# Patient Record
Sex: Female | Born: 1990 | Race: Black or African American | Hispanic: No | Marital: Single
Health system: Southern US, Academic
[De-identification: ages and names within clinical notes are randomized; demographics above are authoritative.]

## PROBLEM LIST (undated history)

## (undated) ENCOUNTER — Encounter

## (undated) ENCOUNTER — Telehealth

## (undated) ENCOUNTER — Ambulatory Visit

## (undated) ENCOUNTER — Encounter: Attending: Internal Medicine | Primary: Internal Medicine

## (undated) ENCOUNTER — Encounter: Payer: PRIVATE HEALTH INSURANCE | Attending: Nutritionist | Primary: Nutritionist

## (undated) ENCOUNTER — Ambulatory Visit: Payer: PRIVATE HEALTH INSURANCE

## (undated) ENCOUNTER — Encounter: Attending: Clinical | Primary: Clinical

## (undated) ENCOUNTER — Encounter: Attending: Adult Health | Primary: Adult Health

## (undated) ENCOUNTER — Ambulatory Visit: Payer: Medicaid (Managed Care)

## (undated) ENCOUNTER — Ambulatory Visit
Payer: PRIVATE HEALTH INSURANCE | Attending: Rehabilitative and Restorative Service Providers" | Primary: Rehabilitative and Restorative Service Providers"

## (undated) ENCOUNTER — Other Ambulatory Visit

## (undated) ENCOUNTER — Encounter: Attending: "Women's Health Care | Primary: "Women's Health Care

## (undated) ENCOUNTER — Encounter: Payer: PRIVATE HEALTH INSURANCE | Attending: Family | Primary: Family

## (undated) ENCOUNTER — Ambulatory Visit: Payer: PRIVATE HEALTH INSURANCE | Attending: Clinical | Primary: Clinical

## (undated) ENCOUNTER — Encounter
Attending: Student in an Organized Health Care Education/Training Program | Primary: Student in an Organized Health Care Education/Training Program

## (undated) ENCOUNTER — Ambulatory Visit: Payer: PRIVATE HEALTH INSURANCE | Attending: Foot and Ankle Surgery | Primary: Foot and Ankle Surgery

## (undated) ENCOUNTER — Encounter: Attending: "Endocrinology | Primary: "Endocrinology

## (undated) ENCOUNTER — Ambulatory Visit
Payer: PRIVATE HEALTH INSURANCE | Attending: Student in an Organized Health Care Education/Training Program | Primary: Student in an Organized Health Care Education/Training Program

## (undated) ENCOUNTER — Ambulatory Visit
Payer: Medicaid (Managed Care) | Attending: Student in an Organized Health Care Education/Training Program | Primary: Student in an Organized Health Care Education/Training Program

## (undated) ENCOUNTER — Telehealth
Attending: Student in an Organized Health Care Education/Training Program | Primary: Student in an Organized Health Care Education/Training Program

## (undated) ENCOUNTER — Ambulatory Visit: Attending: Nutritionist | Primary: Nutritionist

## (undated) ENCOUNTER — Encounter: Attending: Medical | Primary: Medical

## (undated) ENCOUNTER — Ambulatory Visit: Payer: Medicaid (Managed Care) | Attending: Clinical | Primary: Clinical

## (undated) ENCOUNTER — Telehealth: Attending: Urology | Primary: Urology

## (undated) ENCOUNTER — Ambulatory Visit: Payer: Medicaid (Managed Care) | Attending: Acute Care | Primary: Acute Care

## (undated) ENCOUNTER — Encounter: Payer: PRIVATE HEALTH INSURANCE | Attending: Medical | Primary: Medical

## (undated) ENCOUNTER — Encounter: Attending: Family | Primary: Family

## (undated) ENCOUNTER — Ambulatory Visit: Payer: Medicaid (Managed Care) | Attending: Adult Health | Primary: Adult Health

## (undated) ENCOUNTER — Ambulatory Visit: Attending: Clinical | Primary: Clinical

## (undated) ENCOUNTER — Ambulatory Visit: Payer: PRIVATE HEALTH INSURANCE | Attending: Gastroenterology | Primary: Gastroenterology

## (undated) ENCOUNTER — Non-Acute Institutional Stay: Payer: PRIVATE HEALTH INSURANCE

## (undated) ENCOUNTER — Encounter: Attending: Foot and Ankle Surgery | Primary: Foot and Ankle Surgery

## (undated) ENCOUNTER — Ambulatory Visit: Payer: Medicaid (Managed Care) | Attending: Internal Medicine | Primary: Internal Medicine

## (undated) ENCOUNTER — Ambulatory Visit: Attending: Medical | Primary: Medical

## (undated) ENCOUNTER — Ambulatory Visit: Payer: Medicaid (Managed Care) | Attending: Vascular Surgery | Primary: Vascular Surgery

## (undated) ENCOUNTER — Encounter: Attending: Physician Assistant | Primary: Physician Assistant

## (undated) ENCOUNTER — Encounter: Attending: Nutritionist | Primary: Nutritionist

## (undated) ENCOUNTER — Ambulatory Visit: Payer: PRIVATE HEALTH INSURANCE | Attending: "Endocrinology | Primary: "Endocrinology

## (undated) ENCOUNTER — Ambulatory Visit: Payer: Medicaid (Managed Care) | Attending: Geriatric Medicine | Primary: Geriatric Medicine

## (undated) ENCOUNTER — Ambulatory Visit: Payer: PRIVATE HEALTH INSURANCE | Attending: Nutritionist | Primary: Nutritionist

## (undated) ENCOUNTER — Encounter: Attending: Urology | Primary: Urology

## (undated) ENCOUNTER — Encounter
Payer: PRIVATE HEALTH INSURANCE | Attending: Student in an Organized Health Care Education/Training Program | Primary: Student in an Organized Health Care Education/Training Program

## (undated) ENCOUNTER — Other Ambulatory Visit: Attending: Clinical | Primary: Clinical

## (undated) ENCOUNTER — Ambulatory Visit: Payer: PRIVATE HEALTH INSURANCE | Attending: "Women's Health Care | Primary: "Women's Health Care

## (undated) ENCOUNTER — Encounter: Payer: PRIVATE HEALTH INSURANCE | Attending: Registered" | Primary: Registered"

## (undated) ENCOUNTER — Ambulatory Visit: Payer: PRIVATE HEALTH INSURANCE | Attending: Nurse Practitioner | Primary: Nurse Practitioner

## (undated) ENCOUNTER — Encounter
Attending: Rehabilitative and Restorative Service Providers" | Primary: Rehabilitative and Restorative Service Providers"

## (undated) ENCOUNTER — Ambulatory Visit: Payer: PRIVATE HEALTH INSURANCE | Attending: Vascular Surgery | Primary: Vascular Surgery

## (undated) ENCOUNTER — Encounter: Attending: Foot & Ankle Surgery | Primary: Foot & Ankle Surgery

## (undated) DIAGNOSIS — F329 Major depressive disorder, single episode, unspecified: Secondary | ICD-10-CM

## (undated) DIAGNOSIS — R0789 Other chest pain: Secondary | ICD-10-CM

## (undated) DIAGNOSIS — E669 Obesity, unspecified: Secondary | ICD-10-CM

## (undated) DIAGNOSIS — F419 Anxiety disorder, unspecified: Secondary | ICD-10-CM

## (undated) DIAGNOSIS — F32A Depression, unspecified: Secondary | ICD-10-CM

## (undated) DIAGNOSIS — I10 Essential (primary) hypertension: Secondary | ICD-10-CM

## (undated) DIAGNOSIS — E119 Type 2 diabetes mellitus without complications: Secondary | ICD-10-CM

## (undated) DIAGNOSIS — D649 Anemia, unspecified: Secondary | ICD-10-CM

## (undated) DIAGNOSIS — J180 Bronchopneumonia, unspecified organism: Secondary | ICD-10-CM

## (undated) DIAGNOSIS — K219 Gastro-esophageal reflux disease without esophagitis: Secondary | ICD-10-CM

## (undated) DIAGNOSIS — R55 Syncope and collapse: Secondary | ICD-10-CM

## (undated) DIAGNOSIS — L209 Atopic dermatitis, unspecified: Secondary | ICD-10-CM

## (undated) DIAGNOSIS — K3184 Gastroparesis: Secondary | ICD-10-CM

## (undated) DIAGNOSIS — J45909 Unspecified asthma, uncomplicated: Secondary | ICD-10-CM

## (undated) DIAGNOSIS — L732 Hidradenitis suppurativa: Secondary | ICD-10-CM

## (undated) HISTORY — PX: HYDRADENITIS EXCISION: SHX5243

## (undated) HISTORY — DX: Major depressive disorder, single episode, unspecified: F32.9

## (undated) HISTORY — DX: Bronchopneumonia, unspecified organism: J18.0

## (undated) HISTORY — DX: Anxiety disorder, unspecified: F41.9

## (undated) HISTORY — DX: Gastro-esophageal reflux disease without esophagitis: K21.9

## (undated) HISTORY — PX: WISDOM TOOTH EXTRACTION: SHX21

## (undated) HISTORY — DX: Other chest pain: R07.89

## (undated) HISTORY — DX: Essential (primary) hypertension: I10

## (undated) HISTORY — DX: Syncope and collapse: R55

## (undated) HISTORY — DX: Hidradenitis suppurativa: L73.2

## (undated) HISTORY — PX: FRACTURE SURGERY: SHX138

## (undated) HISTORY — DX: Atopic dermatitis, unspecified: L20.9

## (undated) HISTORY — DX: Depression, unspecified: F32.A

## (undated) HISTORY — PX: CHOLECYSTECTOMY: SHX55

## (undated) HISTORY — PX: KNEE SURGERY: SHX244

## (undated) HISTORY — DX: Obesity, unspecified: E66.9

## (undated) HISTORY — DX: Unspecified asthma, uncomplicated: J45.909

## (undated) HISTORY — DX: Type 2 diabetes mellitus without complications: E11.9

## (undated) HISTORY — DX: Gastroparesis: K31.84

## (undated) HISTORY — DX: Anemia, unspecified: D64.9

## (undated) MED ORDER — ROSUVASTATIN ORAL
ORAL | 0 days
Start: ? — End: 2020-07-31

---
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
## 2010-04-17 ENCOUNTER — Emergency Department: Payer: Self-pay | Admitting: Emergency Medicine

## 2011-05-10 ENCOUNTER — Ambulatory Visit: Payer: Self-pay

## 2011-06-08 ENCOUNTER — Ambulatory Visit: Payer: Self-pay

## 2012-09-09 DIAGNOSIS — J302 Other seasonal allergic rhinitis: Secondary | ICD-10-CM | POA: Insufficient documentation

## 2012-12-06 ENCOUNTER — Emergency Department: Payer: Self-pay | Admitting: Emergency Medicine

## 2012-12-06 LAB — COMPREHENSIVE METABOLIC PANEL
Albumin: 3.6 g/dL (ref 3.4–5.0)
Alkaline Phosphatase: 72 U/L
Anion Gap: 5 — ABNORMAL LOW (ref 7–16)
BUN: 11 mg/dL (ref 7–18)
Bilirubin,Total: 0.5 mg/dL (ref 0.2–1.0)
Calcium, Total: 8.7 mg/dL (ref 8.5–10.1)
Chloride: 107 mmol/L (ref 98–107)
Co2: 29 mmol/L (ref 21–32)
Creatinine: 0.57 mg/dL — ABNORMAL LOW (ref 0.60–1.30)
EGFR (African American): >60
EGFR (Non-African Amer.): >60
Glucose: 194 mg/dL — ABNORMAL HIGH (ref 65–99)
Osmolality: 286 (ref 275–301)
Potassium: 4.2 mmol/L (ref 3.5–5.1)
SGOT(AST): 4 U/L — ABNORMAL LOW (ref 15–37)
SGPT (ALT): 14 U/L (ref 12–78)
Sodium: 141 mmol/L (ref 136–145)
Total Protein: 7.3 g/dL (ref 6.4–8.2)

## 2012-12-06 LAB — CBC WITH DIFFERENTIAL/PLATELET
Basophil #: 0 10*3/uL (ref 0.0–0.1)
Basophil %: 0.9 %
Eosinophil #: 0.1 10*3/uL (ref 0.0–0.7)
Eosinophil %: 1.6 %
HCT: 42.1 % (ref 35.0–47.0)
HGB: 14.4 g/dL (ref 12.0–16.0)
Lymphocyte #: 1.2 10*3/uL (ref 1.0–3.6)
Lymphocyte %: 24.7 %
MCH: 30 pg (ref 26.0–34.0)
MCHC: 34.3 g/dL (ref 32.0–36.0)
MCV: 87 fL (ref 80–100)
Monocyte #: 0.3 x10 3/mm (ref 0.2–0.9)
Monocyte %: 5.4 %
Neutrophil #: 3.3 10*3/uL (ref 1.4–6.5)
Neutrophil %: 67.4 %
Platelet: 274 10*3/uL (ref 150–440)
RBC: 4.82 10*6/uL (ref 3.80–5.20)
RDW: 13.2 % (ref 11.5–14.5)
WBC: 5 10*3/uL (ref 3.6–11.0)

## 2012-12-06 LAB — URINALYSIS, COMPLETE
Bilirubin,UR: NEGATIVE
Blood: NEGATIVE
Glucose,UR: >=500 mg/dL (ref 0–75)
Hyaline Cast: 2
Ketone: NEGATIVE
Leukocyte Esterase: NEGATIVE
Nitrite: NEGATIVE
Ph: 5 (ref 4.5–8.0)
Protein: 30
RBC,UR: 1 /HPF (ref 0–5)
Specific Gravity: 1.03 (ref 1.003–1.030)
Squamous Epithelial: 3
WBC UR: 1 /HPF (ref 0–5)

## 2013-03-30 ENCOUNTER — Ambulatory Visit: Payer: Self-pay | Admitting: Physician Assistant

## 2013-04-04 ENCOUNTER — Emergency Department: Payer: Self-pay | Admitting: Internal Medicine

## 2013-04-04 LAB — COMPREHENSIVE METABOLIC PANEL
Albumin: 3.6 g/dL (ref 3.4–5.0)
Alkaline Phosphatase: 69 U/L
Anion Gap: 3 — ABNORMAL LOW (ref 7–16)
BUN: 12 mg/dL (ref 7–18)
Bilirubin,Total: 0.6 mg/dL (ref 0.2–1.0)
Calcium, Total: 8.8 mg/dL (ref 8.5–10.1)
Chloride: 106 mmol/L (ref 98–107)
Co2: 30 mmol/L (ref 21–32)
Creatinine: 0.66 mg/dL (ref 0.60–1.30)
EGFR (African American): >60
EGFR (Non-African Amer.): >60
Glucose: 179 mg/dL — ABNORMAL HIGH (ref 65–99)
Osmolality: 282 (ref 275–301)
Potassium: 3.9 mmol/L (ref 3.5–5.1)
SGOT(AST): <5 U/L — ABNORMAL LOW (ref 15–37)
SGPT (ALT): 13 U/L (ref 12–78)
Sodium: 139 mmol/L (ref 136–145)
Total Protein: 7.5 g/dL (ref 6.4–8.2)

## 2013-04-04 LAB — URINALYSIS, COMPLETE
Bacteria: NONE SEEN
Bilirubin,UR: NEGATIVE
Blood: NEGATIVE
Glucose,UR: >=500 mg/dL (ref 0–75)
Ketone: NEGATIVE
Leukocyte Esterase: NEGATIVE
Nitrite: NEGATIVE
Ph: 5 (ref 4.5–8.0)
Protein: NEGATIVE
RBC,UR: 1 /HPF (ref 0–5)
Specific Gravity: 1.024 (ref 1.003–1.030)
Squamous Epithelial: NONE SEEN
WBC UR: 1 /HPF (ref 0–5)

## 2013-04-04 LAB — CBC WITH DIFFERENTIAL/PLATELET
Basophil #: 0.1 10*3/uL (ref 0.0–0.1)
Basophil %: 1.1 %
Eosinophil #: 0.1 10*3/uL (ref 0.0–0.7)
Eosinophil %: 1.6 %
HCT: 40.8 % (ref 35.0–47.0)
HGB: 13.7 g/dL (ref 12.0–16.0)
Lymphocyte #: 1.3 10*3/uL (ref 1.0–3.6)
Lymphocyte %: 26.2 %
MCH: 29.7 pg (ref 26.0–34.0)
MCHC: 33.5 g/dL (ref 32.0–36.0)
MCV: 89 fL (ref 80–100)
Monocyte #: 0.3 x10 3/mm (ref 0.2–0.9)
Monocyte %: 5.8 %
Neutrophil #: 3.1 10*3/uL (ref 1.4–6.5)
Neutrophil %: 65.3 %
Platelet: 268 10*3/uL (ref 150–440)
RBC: 4.61 10*6/uL (ref 3.80–5.20)
RDW: 13.6 % (ref 11.5–14.5)
WBC: 4.8 10*3/uL (ref 3.6–11.0)

## 2013-04-04 LAB — LIPASE, BLOOD: Lipase: 146 U/L (ref 73–393)

## 2013-04-04 LAB — PREGNANCY, URINE: Pregnancy Test, Urine: NEGATIVE m[IU]/mL

## 2013-04-07 ENCOUNTER — Ambulatory Visit: Payer: Self-pay

## 2013-04-10 ENCOUNTER — Emergency Department: Payer: Self-pay | Admitting: Emergency Medicine

## 2013-04-10 LAB — COMPREHENSIVE METABOLIC PANEL
Albumin: 3.4 g/dL (ref 3.4–5.0)
Alkaline Phosphatase: 66 U/L
Anion Gap: 5 — ABNORMAL LOW (ref 7–16)
BUN: 11 mg/dL (ref 7–18)
Bilirubin,Total: 0.2 mg/dL (ref 0.2–1.0)
Calcium, Total: 8 mg/dL — ABNORMAL LOW (ref 8.5–10.1)
Chloride: 106 mmol/L (ref 98–107)
Co2: 29 mmol/L (ref 21–32)
Creatinine: 0.58 mg/dL — ABNORMAL LOW (ref 0.60–1.30)
EGFR (African American): >60
EGFR (Non-African Amer.): >60
Glucose: 220 mg/dL — ABNORMAL HIGH (ref 65–99)
Osmolality: 286 (ref 275–301)
Potassium: 3.7 mmol/L (ref 3.5–5.1)
SGOT(AST): 12 U/L — ABNORMAL LOW (ref 15–37)
SGPT (ALT): 15 U/L (ref 12–78)
Sodium: 140 mmol/L (ref 136–145)
Total Protein: 7 g/dL (ref 6.4–8.2)

## 2013-04-10 LAB — URINALYSIS, COMPLETE
Bacteria: NONE SEEN
Bilirubin,UR: NEGATIVE
Blood: NEGATIVE
Glucose,UR: >=500 mg/dL (ref 0–75)
Ketone: NEGATIVE
Leukocyte Esterase: NEGATIVE
Nitrite: NEGATIVE
Ph: 6 (ref 4.5–8.0)
Protein: NEGATIVE
RBC,UR: 1 /HPF (ref 0–5)
Specific Gravity: 1.03 (ref 1.003–1.030)
Squamous Epithelial: <1
WBC UR: <1 /HPF (ref 0–5)

## 2013-04-10 LAB — CBC
HCT: 39.9 % (ref 35.0–47.0)
HGB: 13.6 g/dL (ref 12.0–16.0)
MCH: 29.9 pg (ref 26.0–34.0)
MCHC: 34 g/dL (ref 32.0–36.0)
MCV: 88 fL (ref 80–100)
Platelet: 248 10*3/uL (ref 150–440)
RBC: 4.54 10*6/uL (ref 3.80–5.20)
RDW: 13.5 % (ref 11.5–14.5)
WBC: 5.5 10*3/uL (ref 3.6–11.0)

## 2013-04-10 LAB — LIPASE, BLOOD: Lipase: 126 U/L (ref 73–393)

## 2013-04-10 LAB — PREGNANCY, URINE: Pregnancy Test, Urine: NEGATIVE m[IU]/mL

## 2013-04-16 ENCOUNTER — Ambulatory Visit: Payer: Self-pay

## 2013-04-22 DIAGNOSIS — E119 Type 2 diabetes mellitus without complications: Secondary | ICD-10-CM | POA: Insufficient documentation

## 2013-04-22 DIAGNOSIS — K8020 Calculus of gallbladder without cholecystitis without obstruction: Secondary | ICD-10-CM | POA: Insufficient documentation

## 2013-09-10 ENCOUNTER — Emergency Department: Payer: Self-pay | Admitting: Emergency Medicine

## 2013-09-10 LAB — URINALYSIS, COMPLETE
Bacteria: NONE SEEN
Bilirubin,UR: NEGATIVE
Blood: NEGATIVE
Glucose,UR: >=500 mg/dL (ref 0–75)
Ketone: NEGATIVE
Leukocyte Esterase: NEGATIVE
Nitrite: NEGATIVE
Ph: 6 (ref 4.5–8.0)
Protein: NEGATIVE
RBC,UR: <1 /HPF (ref 0–5)
Specific Gravity: 1.044 (ref 1.003–1.030)
Squamous Epithelial: 1
WBC UR: 2 /HPF (ref 0–5)

## 2013-09-10 LAB — COMPREHENSIVE METABOLIC PANEL
Albumin: 3.5 g/dL (ref 3.4–5.0)
Alkaline Phosphatase: 79 U/L
Anion Gap: 5 — ABNORMAL LOW (ref 7–16)
BUN: 8 mg/dL (ref 7–18)
Bilirubin,Total: 0.4 mg/dL (ref 0.2–1.0)
Calcium, Total: 8.3 mg/dL — ABNORMAL LOW (ref 8.5–10.1)
Chloride: 106 mmol/L (ref 98–107)
Co2: 30 mmol/L (ref 21–32)
Creatinine: 0.62 mg/dL (ref 0.60–1.30)
EGFR (African American): >60
EGFR (Non-African Amer.): >60
Glucose: 249 mg/dL — ABNORMAL HIGH (ref 65–99)
Osmolality: 288 (ref 275–301)
Potassium: 3.7 mmol/L (ref 3.5–5.1)
SGOT(AST): 22 U/L (ref 15–37)
SGPT (ALT): 14 U/L
Sodium: 141 mmol/L (ref 136–145)
Total Protein: 7.4 g/dL (ref 6.4–8.2)

## 2013-09-10 LAB — CBC WITH DIFFERENTIAL/PLATELET
Basophil #: 0 10*3/uL (ref 0.0–0.1)
Basophil %: 0.4 %
Eosinophil #: 0.2 10*3/uL (ref 0.0–0.7)
Eosinophil %: 3.6 %
HCT: 42.6 % (ref 35.0–47.0)
HGB: 14.1 g/dL (ref 12.0–16.0)
Lymphocyte #: 1.2 10*3/uL (ref 1.0–3.6)
Lymphocyte %: 24.6 %
MCH: 29.3 pg (ref 26.0–34.0)
MCHC: 33.1 g/dL (ref 32.0–36.0)
MCV: 89 fL (ref 80–100)
Monocyte #: 0.3 x10 3/mm (ref 0.2–0.9)
Monocyte %: 5.3 %
Neutrophil #: 3.3 10*3/uL (ref 1.4–6.5)
Neutrophil %: 66.1 %
Platelet: 258 10*3/uL (ref 150–440)
RBC: 4.8 10*6/uL (ref 3.80–5.20)
RDW: 13.5 % (ref 11.5–14.5)
WBC: 5 10*3/uL (ref 3.6–11.0)

## 2013-09-10 LAB — LIPASE, BLOOD: Lipase: 104 U/L (ref 73–393)

## 2013-10-04 ENCOUNTER — Ambulatory Visit: Payer: Self-pay | Admitting: Gastroenterology

## 2013-10-12 DIAGNOSIS — E1143 Type 2 diabetes mellitus with diabetic autonomic (poly)neuropathy: Secondary | ICD-10-CM | POA: Insufficient documentation

## 2013-10-12 DIAGNOSIS — K3184 Gastroparesis: Secondary | ICD-10-CM

## 2013-11-22 ENCOUNTER — Emergency Department: Payer: Self-pay | Admitting: Emergency Medicine

## 2013-11-22 LAB — CBC
HCT: 45.6 % (ref 35.0–47.0)
HGB: 15 g/dL (ref 12.0–16.0)
MCH: 29.5 pg (ref 26.0–34.0)
MCHC: 32.9 g/dL (ref 32.0–36.0)
MCV: 90 fL (ref 80–100)
Platelet: 215 10*3/uL (ref 150–440)
RBC: 5.08 10*6/uL (ref 3.80–5.20)
RDW: 12.6 % (ref 11.5–14.5)
WBC: 5.9 10*3/uL (ref 3.6–11.0)

## 2013-11-22 LAB — BASIC METABOLIC PANEL
Anion Gap: 7 (ref 7–16)
BUN: 16 mg/dL (ref 7–18)
Calcium, Total: 8.3 mg/dL — ABNORMAL LOW (ref 8.5–10.1)
Chloride: 105 mmol/L (ref 98–107)
Co2: 26 mmol/L (ref 21–32)
Creatinine: 0.72 mg/dL (ref 0.60–1.30)
EGFR (African American): >60
EGFR (Non-African Amer.): >60
Glucose: 384 mg/dL — ABNORMAL HIGH (ref 65–99)
Osmolality: 293 (ref 275–301)
Potassium: 4.1 mmol/L (ref 3.5–5.1)
Sodium: 138 mmol/L (ref 136–145)

## 2013-11-22 LAB — URINALYSIS, COMPLETE
Bacteria: NONE SEEN
Bilirubin,UR: NEGATIVE
Blood: NEGATIVE
Glucose,UR: >=500 mg/dL (ref 0–75)
Leukocyte Esterase: NEGATIVE
Nitrite: NEGATIVE
Ph: 6 (ref 4.5–8.0)
Protein: NEGATIVE
RBC,UR: <1 /HPF (ref 0–5)
Specific Gravity: 1.041 (ref 1.003–1.030)
Squamous Epithelial: <1
WBC UR: <1 /HPF (ref 0–5)

## 2013-11-22 LAB — TROPONIN I: Troponin-I: <0.02 ng/mL

## 2013-11-22 LAB — HCG, QUANTITATIVE, PREGNANCY: Beta Hcg, Quant.: <1 m[IU]/mL — ABNORMAL LOW

## 2013-11-23 ENCOUNTER — Ambulatory Visit: Payer: Self-pay | Admitting: Urgent Care

## 2013-11-26 ENCOUNTER — Emergency Department: Payer: Self-pay | Admitting: Emergency Medicine

## 2013-11-26 LAB — CBC WITH DIFFERENTIAL/PLATELET
Basophil #: 0 10*3/uL (ref 0.0–0.1)
Basophil %: 0.6 %
Eosinophil #: 0.2 10*3/uL (ref 0.0–0.7)
Eosinophil %: 3 %
HCT: 44.3 % (ref 35.0–47.0)
HGB: 14.9 g/dL (ref 12.0–16.0)
Lymphocyte #: 1.7 10*3/uL (ref 1.0–3.6)
Lymphocyte %: 31.7 %
MCH: 29.5 pg (ref 26.0–34.0)
MCHC: 33.7 g/dL (ref 32.0–36.0)
MCV: 88 fL (ref 80–100)
Monocyte #: 0.3 x10 3/mm (ref 0.2–0.9)
Monocyte %: 5.5 %
Neutrophil #: 3.2 10*3/uL (ref 1.4–6.5)
Neutrophil %: 59.2 %
Platelet: 245 10*3/uL (ref 150–440)
RBC: 5.06 10*6/uL (ref 3.80–5.20)
RDW: 12.6 % (ref 11.5–14.5)
WBC: 5.4 10*3/uL (ref 3.6–11.0)

## 2013-11-26 LAB — COMPREHENSIVE METABOLIC PANEL
Albumin: 3.4 g/dL (ref 3.4–5.0)
Alkaline Phosphatase: 78 U/L
Anion Gap: 6 — ABNORMAL LOW (ref 7–16)
BUN: 11 mg/dL (ref 7–18)
Bilirubin,Total: 0.4 mg/dL (ref 0.2–1.0)
Calcium, Total: 9.1 mg/dL (ref 8.5–10.1)
Chloride: 104 mmol/L (ref 98–107)
Co2: 28 mmol/L (ref 21–32)
Creatinine: 0.63 mg/dL (ref 0.60–1.30)
EGFR (African American): >60
EGFR (Non-African Amer.): >60
Glucose: 352 mg/dL — ABNORMAL HIGH (ref 65–99)
Osmolality: 289 (ref 275–301)
Potassium: 4 mmol/L (ref 3.5–5.1)
SGOT(AST): 7 U/L — ABNORMAL LOW (ref 15–37)
SGPT (ALT): 20 U/L
Sodium: 138 mmol/L (ref 136–145)
Total Protein: 6.9 g/dL (ref 6.4–8.2)

## 2013-11-26 LAB — LIPASE, BLOOD: Lipase: 135 U/L (ref 73–393)

## 2013-11-27 LAB — URINALYSIS, COMPLETE
Bacteria: NONE SEEN
Bilirubin,UR: NEGATIVE
Blood: NEGATIVE
Glucose,UR: >=500 mg/dL (ref 0–75)
Ketone: NEGATIVE
Leukocyte Esterase: NEGATIVE
Nitrite: NEGATIVE
Ph: 6 (ref 4.5–8.0)
Protein: NEGATIVE
RBC,UR: <1 /HPF (ref 0–5)
Specific Gravity: 1.036 (ref 1.003–1.030)
Squamous Epithelial: 1
WBC UR: <1 /HPF (ref 0–5)

## 2013-11-27 LAB — PREGNANCY, URINE: Pregnancy Test, Urine: NEGATIVE m[IU]/mL

## 2013-12-20 ENCOUNTER — Emergency Department: Payer: Self-pay | Admitting: Emergency Medicine

## 2013-12-20 LAB — CBC
HCT: 41.7 % (ref 35.0–47.0)
HGB: 13.8 g/dL (ref 12.0–16.0)
MCH: 29.4 pg (ref 26.0–34.0)
MCHC: 33.2 g/dL (ref 32.0–36.0)
MCV: 89 fL (ref 80–100)
Platelet: 221 10*3/uL (ref 150–440)
RBC: 4.71 10*6/uL (ref 3.80–5.20)
RDW: 12.9 % (ref 11.5–14.5)
WBC: 4.8 10*3/uL (ref 3.6–11.0)

## 2013-12-20 LAB — APTT: Activated PTT: 26 secs (ref 23.6–35.9)

## 2013-12-20 LAB — BASIC METABOLIC PANEL
Anion Gap: 7 (ref 7–16)
BUN: 11 mg/dL (ref 7–18)
Calcium, Total: 8.9 mg/dL (ref 8.5–10.1)
Chloride: 100 mmol/L (ref 98–107)
Co2: 29 mmol/L (ref 21–32)
Creatinine: 0.74 mg/dL (ref 0.60–1.30)
EGFR (African American): >60
EGFR (Non-African Amer.): >60
Glucose: 438 mg/dL — ABNORMAL HIGH (ref 65–99)
Osmolality: 290 (ref 275–301)
Potassium: 4 mmol/L (ref 3.5–5.1)
Sodium: 136 mmol/L (ref 136–145)

## 2013-12-20 LAB — D-DIMER(ARMC): D-Dimer: 153 ng/ml

## 2013-12-21 ENCOUNTER — Ambulatory Visit: Payer: Self-pay

## 2014-01-24 ENCOUNTER — Ambulatory Visit: Payer: Self-pay

## 2014-01-24 LAB — HCG, QUANTITATIVE, PREGNANCY: Beta Hcg, Quant.: <1 m[IU]/mL — ABNORMAL LOW

## 2014-02-13 ENCOUNTER — Emergency Department: Payer: Self-pay | Admitting: Emergency Medicine

## 2014-02-15 ENCOUNTER — Emergency Department: Payer: Self-pay | Admitting: Internal Medicine

## 2014-02-23 DIAGNOSIS — M545 Low back pain, unspecified: Secondary | ICD-10-CM | POA: Insufficient documentation

## 2014-02-23 DIAGNOSIS — R202 Paresthesia of skin: Secondary | ICD-10-CM | POA: Insufficient documentation

## 2014-07-07 DIAGNOSIS — E119 Type 2 diabetes mellitus without complications: Secondary | ICD-10-CM

## 2014-07-07 DIAGNOSIS — J180 Bronchopneumonia, unspecified organism: Secondary | ICD-10-CM | POA: Insufficient documentation

## 2014-07-07 DIAGNOSIS — L309 Dermatitis, unspecified: Secondary | ICD-10-CM | POA: Insufficient documentation

## 2014-07-07 DIAGNOSIS — F419 Anxiety disorder, unspecified: Secondary | ICD-10-CM

## 2014-07-07 DIAGNOSIS — L209 Atopic dermatitis, unspecified: Secondary | ICD-10-CM | POA: Insufficient documentation

## 2014-07-07 DIAGNOSIS — D649 Anemia, unspecified: Secondary | ICD-10-CM | POA: Insufficient documentation

## 2014-07-07 DIAGNOSIS — E785 Hyperlipidemia, unspecified: Secondary | ICD-10-CM | POA: Insufficient documentation

## 2014-07-07 DIAGNOSIS — R202 Paresthesia of skin: Secondary | ICD-10-CM | POA: Insufficient documentation

## 2014-07-07 DIAGNOSIS — I10 Essential (primary) hypertension: Secondary | ICD-10-CM | POA: Insufficient documentation

## 2014-07-07 DIAGNOSIS — E669 Obesity, unspecified: Secondary | ICD-10-CM | POA: Insufficient documentation

## 2014-07-07 DIAGNOSIS — J45909 Unspecified asthma, uncomplicated: Secondary | ICD-10-CM

## 2014-07-07 DIAGNOSIS — K219 Gastro-esophageal reflux disease without esophagitis: Secondary | ICD-10-CM | POA: Insufficient documentation

## 2014-07-08 ENCOUNTER — Ambulatory Visit (INDEPENDENT_AMBULATORY_CARE_PROVIDER_SITE_OTHER): Payer: BLUE CROSS/BLUE SHIELD | Admitting: Unknown Physician Specialty

## 2014-07-08 ENCOUNTER — Encounter: Payer: Self-pay | Admitting: Unknown Physician Specialty

## 2014-07-08 VITALS — BP 143/96 | HR 92 | Temp 98.2°F | Ht 65.5 in | Wt 201.8 lb

## 2014-07-08 DIAGNOSIS — J0100 Acute maxillary sinusitis, unspecified: Secondary | ICD-10-CM

## 2014-07-08 MED ORDER — AMOXICILLIN 875 MG PO TABS: 875.0000 mg | ORAL_TABLET | Freq: Two times a day (BID) | ORAL | Status: DC

## 2014-07-08 NOTE — Progress Notes (Signed)
   BP 143/96 mmHg  Pulse 92  Temp(Src) 98.2 F (36.8 C)  Ht 5' 5.5" (1.664 m)  Wt 201 lb 12.8 oz (91.536 kg)  BMI 33.06 kg/m2  SpO2 98%  LMP 06/27/2014 (Exact Date)   Subjective:    Patient ID: Kristen Tran, female    DOB: 1990-04-27, 24 y.o.   MRN: [id number]  HPI: Kristen Tran is a 24 y.o. female  Chief Complaint  Patient presents with  . Sinusitis    pt stated symptoms started last Wednesday (June 22nd)  . Sore Throat  . Facial Pain  . Nasal Congestion   Sinusitis This is a new problem. The current episode started 1 to 4 weeks ago. The problem is unchanged. There has been no fever. The pain is mild. Associated symptoms include sinus pressure and a sore throat. Pertinent negatives include no chills, diaphoresis or sneezing. Past treatments include acetaminophen. The treatment provided moderate relief.     Relevant past medical, surgical, family and social history reviewed and updated as indicated. Interim medical history since our last visit reviewed. Allergies and medications reviewed and updated.  Review of Systems  Constitutional: Negative for chills and diaphoresis.  HENT: Positive for sinus pressure and sore throat. Negative for sneezing.     Per HPI unless specifically indicated above     Objective:    BP 143/96 mmHg  Pulse 92  Temp(Src) 98.2 F (36.8 C)  Ht 5' 5.5" (1.664 m)  Wt 201 lb 12.8 oz (91.536 kg)  BMI 33.06 kg/m2  SpO2 98%  LMP 06/27/2014 (Exact Date)  Wt Readings from Last 3 Encounters:  07/08/14 201 lb 12.8 oz (91.536 kg)  02/09/14 199 lb (90.266 kg)    Physical Exam  Constitutional: She is oriented to person, place, and time. She appears well-developed and well-nourished. No distress.  HENT:  Head: Normocephalic and atraumatic.  Right Ear: Tympanic membrane is erythematous. A middle ear effusion is present.  Left Ear: Tympanic membrane normal.  Nose: Mucosal edema present. No sinus tenderness or  nasal deformity.  Mouth/Throat: Posterior oropharyngeal erythema present.  Eyes: Conjunctivae and lids are normal. Right eye exhibits no discharge. Left eye exhibits no discharge. No scleral icterus.  Cardiovascular: Normal rate and regular rhythm.   Pulmonary/Chest: Effort normal. No respiratory distress.  Abdominal: Normal appearance and bowel sounds are normal. She exhibits no distension. There is no splenomegaly or hepatomegaly. There is no tenderness.  Musculoskeletal: Normal range of motion.  Neurological: She is alert and oriented to person, place, and time.  Skin: Skin is intact. No rash noted. No pallor.  Psychiatric: She has a normal mood and affect. Her behavior is normal. Judgment and thought content normal.    Results for orders placed or performed in visit on 01/24/14  hCG, quantitative, pregnancy  Result Value Ref Range   Beta Hcg, Quant. < 1 (L) mIU/mL      Assessment & Plan:   Problem List Items Addressed This Visit    None    Visit Diagnoses    Acute maxillary sinusitis, recurrence not specified    -  Primary    Relevant Medications    amoxicillin (AMOXIL) 875 MG tablet        Follow up plan: Return if symptoms worsen or fail to improve, for Sched PE.

## 2014-08-02 ENCOUNTER — Encounter: Payer: Self-pay | Admitting: Unknown Physician Specialty

## 2014-08-02 ENCOUNTER — Ambulatory Visit (INDEPENDENT_AMBULATORY_CARE_PROVIDER_SITE_OTHER): Payer: BLUE CROSS/BLUE SHIELD | Admitting: Unknown Physician Specialty

## 2014-08-02 VITALS — BP 154/93 | HR 90 | Temp 99.4°F | Ht 65.8 in | Wt 200.0 lb

## 2014-08-02 DIAGNOSIS — E119 Type 2 diabetes mellitus without complications: Secondary | ICD-10-CM

## 2014-08-02 DIAGNOSIS — Z3009 Encounter for other general counseling and advice on contraception: Secondary | ICD-10-CM | POA: Diagnosis not present

## 2014-08-02 DIAGNOSIS — Z0000 Encounter for general adult medical examination without abnormal findings: Secondary | ICD-10-CM | POA: Diagnosis not present

## 2014-08-02 LAB — UA/M W/RFLX CULTURE, ROUTINE
Bilirubin, UA: NEGATIVE
Leukocytes, UA: NEGATIVE
Nitrite, UA: NEGATIVE
Protein, UA: NEGATIVE
RBC, UA: NEGATIVE
Specific Gravity, UA: 1.005 (ref 1.005–1.030)
Urobilinogen, Ur: 0.2 mg/dL (ref 0.2–1.0)
pH, UA: 5 (ref 5.0–7.5)

## 2014-08-02 LAB — MICROALBUMIN, URINE WAIVED
Creatinine, Urine Waived: 50 mg/dL (ref 10–300)
Microalb, Ur Waived: 10 mg/L (ref 0–19)
Microalb/Creat Ratio: <30 mg/g (ref <30)

## 2014-08-02 MED ORDER — NORETHINDRONE 0.35 MG PO TABS: 1.0000 | ORAL_TABLET | Freq: Every day | ORAL | Status: DC

## 2014-08-02 NOTE — Progress Notes (Signed)
BP 154/93 mmHg  Pulse 90  Temp(Src) 99.4 F (37.4 C)  Ht 5' 5.8" (1.671 m)  Wt 200 lb (90.719 kg)  BMI 32.49 kg/m2  SpO2 98%  LMP 07/27/2014 (Exact Date)   Subjective:    Patient ID: Kristen Tran, female    DOB: Aug 23, 1990, 24 y.o.   MRN: [id number]  HPI: Kristen Tran is a 24 y.o. female  Chief Complaint  Patient presents with  . Annual Exam    Relevant past medical, surgical, family and social history reviewed and updated as indicated. Interim medical history since our last visit reviewed. Allergies and medications reviewed and updated.  Review of Systems  Constitutional: Negative.   HENT: Negative.   Eyes: Negative.   Respiratory: Negative.   Cardiovascular: Negative.   Gastrointestinal: Negative.   Endocrine: Negative.   Genitourinary: Negative.   Musculoskeletal: Negative.   Skin: Negative.   Allergic/Immunologic: Negative.   Neurological: Negative.   Hematological: Negative.   Psychiatric/Behavioral: Negative.     Per HPI unless specifically indicated above     Objective:    BP 154/93 mmHg  Pulse 90  Temp(Src) 99.4 F (37.4 C)  Ht 5' 5.8" (1.671 m)  Wt 200 lb (90.719 kg)  BMI 32.49 kg/m2  SpO2 98%  LMP 07/27/2014 (Exact Date)  Wt Readings from Last 3 Encounters:  08/02/14 200 lb (90.719 kg)  07/08/14 201 lb 12.8 oz (91.536 kg)  02/09/14 199 lb (90.266 kg)    Physical Exam  Constitutional: She is oriented to person, place, and time. She appears well-developed and well-nourished.  HENT:  Head: Normocephalic and atraumatic.  Eyes: Pupils are equal, round, and reactive to light. Right eye exhibits no discharge. Left eye exhibits no discharge. No scleral icterus.  Neck: Normal range of motion. Neck supple. Carotid bruit is not present. No thyromegaly present.  Cardiovascular: Normal rate, regular rhythm and normal heart sounds.  Exam reveals no gallop and no friction rub.   No murmur heard. Pulmonary/Chest:  Effort normal and breath sounds normal. No respiratory distress. She has no wheezes. She has no rales.  Abdominal: Soft. Bowel sounds are normal. There is no tenderness. There is no rebound.  Genitourinary: Vagina normal and uterus normal. No breast swelling, tenderness or discharge. Cervix exhibits discharge. Cervix exhibits no motion tenderness and no friability. Right adnexum displays no mass, no tenderness and no fullness. Left adnexum displays no mass, no tenderness and no fullness.  Musculoskeletal: Normal range of motion.  Lymphadenopathy:    She has no cervical adenopathy.  Neurological: She is alert and oriented to person, place, and time.  Skin: Skin is warm, dry and intact. No rash noted.  Psychiatric: She has a normal mood and affect. Her speech is normal and behavior is normal. Judgment and thought content normal. Cognition and memory are normal.     Assessment & Plan:   Problem List Items Addressed This Visit      Unprioritized   Diabetes   Relevant Orders   Microalbumin, Urine Waived    Other Visit Diagnoses    Annual physical exam    -  Primary    Relevant Medications    norethindrone (ORTHO MICRONOR) 0.35 MG tablet    Other Relevant Orders    Pap Lb, rfx HPV ASCU    UA/M w/rflx Culture, Routine      BP not to goal today but first pap smear today and nervous.  Good numbers at home.  Recheck in 3 months.  Reviewed  Endocrine notes and got a CMP, Hgb A1C, and Lipid panel.   Follow up plan: Return in about 3 months (around 11/02/2014).

## 2014-08-02 NOTE — Patient Instructions (Signed)
Intrauterine Device Information An intrauterine device (IUD) is inserted into your uterus to prevent pregnancy. There are two types of IUDs available:   Copper IUD--This type of IUD is wrapped in copper wire and is placed inside the uterus. Copper makes the uterus and fallopian tubes produce a fluid that kills sperm. The copper IUD can stay in place for 10 years.  Hormone IUD--This type of IUD contains the hormone progestin (synthetic progesterone). The hormone thickens the cervical mucus and prevents sperm from entering the uterus. It also thins the uterine lining to prevent implantation of a fertilized egg. The hormone can weaken or kill the sperm that get into the uterus. One type of hormone IUD can stay in place for 5 years, and another type can stay in place for 3 years. Your health care provider will make sure you are a good candidate for a contraceptive IUD. Discuss with your health care provider the possible side effects.  ADVANTAGES OF AN INTRAUTERINE DEVICE  IUDs are highly effective, reversible, long acting, and low maintenance.   There are no estrogen-related side effects.   An IUD can be used when breastfeeding.   IUDs are not associated with weight gain.   The copper IUD works immediately after insertion.   The hormone IUD works right away if inserted within 7 days of your period starting. You will need to use a backup method of birth control for 7 days if the hormone IUD is inserted at any other time in your cycle.  The copper IUD does not interfere with your female hormones.   The hormone IUD can make heavy menstrual periods lighter and decrease cramping.   The hormone IUD can be used for 3 or 5 years.   The copper IUD can be used for 10 years. DISADVANTAGES OF AN INTRAUTERINE DEVICE  The hormone IUD can be associated with irregular bleeding patterns.   The copper IUD can make your menstrual flow heavier and more painful.   You may experience cramping and  vaginal bleeding after insertion.  Document Released: 11/28/2003 Document Revised: 08/26/2012 Document Reviewed: 06/14/2012 [employer] Patient Information 2015 [employer]. This information is not intended to replace advice given to you by your health care provider. Make sure you discuss any questions you have with your health care provider.  

## 2014-08-04 LAB — PAP LB, RFX HPV ASCU: PAP Smear Comment: 0

## 2014-08-19 ENCOUNTER — Encounter: Payer: Self-pay | Admitting: Unknown Physician Specialty

## 2014-08-19 ENCOUNTER — Ambulatory Visit (INDEPENDENT_AMBULATORY_CARE_PROVIDER_SITE_OTHER): Payer: BLUE CROSS/BLUE SHIELD | Admitting: Unknown Physician Specialty

## 2014-08-19 VITALS — BP 143/98 | HR 85 | Temp 98.4°F | Ht 65.5 in | Wt 207.4 lb

## 2014-08-19 DIAGNOSIS — H1013 Acute atopic conjunctivitis, bilateral: Secondary | ICD-10-CM | POA: Diagnosis not present

## 2014-08-19 MED ORDER — CROMOLYN SODIUM 4 % OP SOLN: 1.0000 [drp] | Freq: Four times a day (QID) | OPHTHALMIC | Status: DC

## 2014-08-19 NOTE — Progress Notes (Signed)
   BP 143/98 mmHg  Pulse 85  Temp(Src) 98.4 F (36.9 C)  Ht 5' 5.5" (1.664 m)  Wt 207 lb 6.4 oz (94.076 kg)  BMI 33.98 kg/m2  SpO2 97%  LMP 07/27/2014   Subjective:    Patient ID: Kristen Tran, female    DOB: [birth year], 24 y.o.   MRN: [id number]  HPI: [name] is a 24 y.o. female  Chief Complaint  Patient presents with  . Eye Problem    pt states eye got really red and itchy on Wednesday, tried using "clear eyes and allergy eye drops". States help for a few hours but then redness and itchiness came back   Allergic conjunctivitis: Right eye began itching 2 days ago, left eye began itching today. She has used over the counter eye drops with relief up to four hours. Denies burning or discharge. Denies eye pain or visual disturbances.  Relevant past medical, surgical, family and social history reviewed and updated as indicated. Interim medical history since our last visit reviewed. Allergies and medications reviewed and updated.  Review of Systems  Constitutional: Negative.   HENT: Negative.   Eyes: Positive for itching. Negative for photophobia, pain, discharge, redness and visual disturbance.  Respiratory: Negative.  Negative for cough, chest tightness, shortness of breath, wheezing and stridor.   Cardiovascular: Negative.  Negative for chest pain, palpitations and leg swelling.  Skin: Negative.   Psychiatric/Behavioral: Negative.     Per HPI unless specifically indicated above     Objective:    BP 143/98 mmHg  Pulse 85  Temp(Src) 98.4 F (36.9 C)  Ht 5' 5.5" (1.664 m)  Wt 207 lb 6.4 oz (94.076 kg)  BMI 33.98 kg/m2  SpO2 97%  LMP 07/27/2014  Wt Readings from Last 3 Encounters:  08/19/14 207 lb 6.4 oz (94.076 kg)  08/02/14 200 lb (90.719 kg)  07/08/14 201 lb 12.8 oz (91.536 kg)    Physical Exam  Constitutional: She is oriented to person, place, and time. She appears well-developed and well-nourished. No distress.  HENT:   Head: Normocephalic and atraumatic.  Eyes: Conjunctivae and lids are normal. Pupils are equal, round, and reactive to light. Right eye exhibits no discharge and no exudate. No foreign body present in the right eye. Left eye exhibits no discharge and no exudate. No foreign body present in the left eye. No scleral icterus.  Neck: Normal range of motion.  Cardiovascular: Normal rate, regular rhythm and normal heart sounds.  Exam reveals no gallop and no friction rub.   No murmur heard. Pulmonary/Chest: Effort normal and breath sounds normal. No respiratory distress. She has no wheezes. She has no rales. She exhibits no tenderness.  Musculoskeletal: Normal range of motion.  Neurological: She is alert and oriented to person, place, and time.  Skin: Skin is warm and dry. She is not diaphoretic.  Psychiatric: She has a normal mood and affect. Her behavior is normal. Judgment and thought content normal.        Assessment & Plan:   Problem List Items Addressed This Visit    None    Visit Diagnoses    Allergic conjunctivitis, bilateral    -  Primary    Eyes itiching times 2 days        Follow up plan: Prescription given for cromolyn eye drops. Call office if worsens or discharge is noted. Follow up in 2 months as scheduled for blood pressure check.

## 2014-08-24 DIAGNOSIS — M545 Low back pain, unspecified: Secondary | ICD-10-CM | POA: Insufficient documentation

## 2014-08-24 DIAGNOSIS — G8929 Other chronic pain: Secondary | ICD-10-CM | POA: Insufficient documentation

## 2014-08-24 DIAGNOSIS — IMO0001 Reserved for inherently not codable concepts without codable children: Secondary | ICD-10-CM | POA: Insufficient documentation

## 2014-08-24 DIAGNOSIS — E119 Type 2 diabetes mellitus without complications: Secondary | ICD-10-CM

## 2014-08-24 DIAGNOSIS — Z794 Long term (current) use of insulin: Secondary | ICD-10-CM

## 2014-09-19 ENCOUNTER — Encounter: Payer: BLUE CROSS/BLUE SHIELD | Attending: Neurology | Admitting: Dietician

## 2014-09-19 ENCOUNTER — Encounter: Payer: Self-pay | Admitting: Dietician

## 2014-09-19 VITALS — BP 164/94 | Ht 65.0 in | Wt 196.8 lb

## 2014-09-19 DIAGNOSIS — E119 Type 2 diabetes mellitus without complications: Secondary | ICD-10-CM | POA: Diagnosis not present

## 2014-09-19 DIAGNOSIS — E1042 Type 1 diabetes mellitus with diabetic polyneuropathy: Secondary | ICD-10-CM

## 2014-09-19 NOTE — Progress Notes (Signed)
Diabetes Self-Management Education  Visit Type:    Appt. Start Time: 1030 Appt. End Time: 1145  09/19/2014  Ms. Kristen Tran, identified by name and date of birth, is a 24 y.o. female with a diagnosis of Diabetes.    ASSESSMENT  Pt is requesting nutritional information to help diabetes control.  Lacks knowledge of diabetes care-has had little diabetes education in past Pt takes Novolog per sliding scale before meals and snacks   Blood pressure 164/94, height  (1.651 m), weight 196 lb 12.8 oz (89.268 kg). Body mass index is 32.75 kg/(m^2).   Pt reports having gastroparesis and neuropathy-Sees endocrinologist at [facility] gastroenterologist and neurologist (Dr. Malvin Johns). PCP is Gabriel Cirri FNP Pt checks BG's 3x/day before meals-reports recent results range mostly 80's-250's.  Last dental exam was 11-2009. Last Eye exam was 01-2014.  Pt. Eats 3 meals/day and 3 snacks.  Pt has started new job and is in training and work schedule varies for now which results in different  times for meals.  Pt. eats snack foods 4-5x/wk. Drinks sports drinks 4-5x/day. Eats lots of fruit daily.     Individualized Plan for Diabetes Self-Management Training:   Learning Objective:  Patient will have a greater understanding of diabetes self-management. Patient education plan is to attend individual and/or group sessions per assessed needs and concerns.   Plan:   Patient Instructions   Check blood sugars 4 x day before each meal and before bed every day  Exercise: walking 30-60 minutes   3-4  days a week  Avoid sugar sweetened drinks (soda, tea, coffee, sports drinks, juices)-try Powerade Zero  Eat 3 meals day,   2-3  snacks a day  Space meals 4-5 hours apart  Complete 3 Day Food Record and bring to next appt  Make a  dentist  doctor appointment  Bring blood sugar records to the next appointment/class  Get a Sharps container  Carry fast acting glucose and a snack  at all times  Carry medical alert ID  Rotate injection sites  Return for appointment/classes on:  09-30-14   Expected Outcomes:     Education material provided: general meal planning guidelines, JDRF Adult type 1 toolkit booklet, Medical alert ID card, low BG handout  If problems or questions, patient to contact team via: [phone]  Future DSME appointment:  09-30-14

## 2014-09-19 NOTE — Patient Instructions (Signed)
  Check blood sugars 4 x day before each meal and before bed every day  Exercise: walking 30-60 minutes   3-4  days a week  Avoid sugar sweetened drinks (soda, tea, coffee, sports drinks, juices)-try Powerade Zero  Eat 3 meals day,   2-3  snacks a day  Space meals 4-5 hours apart  Complete 3 Day Food Record and bring to next appt  Make a  dentist  doctor appointment  Bring blood sugar records to the next appointment/class  Get a Sharps container  Carry fast acting glucose and a snack at all times  Carry medical alert ID  Rotate injection sites  Return for appointment/classes on:  09-30-14

## 2014-09-30 ENCOUNTER — Encounter: Payer: BLUE CROSS/BLUE SHIELD | Admitting: Dietician

## 2014-09-30 ENCOUNTER — Encounter: Payer: Self-pay | Admitting: Dietician

## 2014-09-30 VITALS — Wt 199.6 lb

## 2014-09-30 DIAGNOSIS — E109 Type 1 diabetes mellitus without complications: Secondary | ICD-10-CM

## 2014-09-30 DIAGNOSIS — E119 Type 2 diabetes mellitus without complications: Secondary | ICD-10-CM | POA: Diagnosis not present

## 2014-09-30 NOTE — Patient Instructions (Signed)
Spread 11-12 servings of carbohydrate over 3 meals and 2 snacks per day. Balance meals with protein, 2-3 servings of carbohydrate and "free vegetables". Limit raw vegetables and some raw fruits. Begin decreasing sugar sweetened beverages by drinking half/half with sugar/diet. Drink at least 2 bottle water (16 oz) per day. Use calorieking.com to look up nutrition information for fast food restaurants. Bring glucose records at next visit.

## 2014-09-30 NOTE — Progress Notes (Signed)
Diabetes Self-Management Education  Visit Type:  Follow-up  Appt. Start Time: 13:20 Appt. End Time: 14:20  09/30/2014  Ms. Kristen Tran, identified by name and date of birth, is a 24 y.o. female with a diagnosis of Diabetes:  Marland Kitchen Type 1 Diabetes   ASSESSMENT Patient accompanied by her friend in for a follow-up appointment. Patient did not bring food or glucose records. Stated that she had misplaced the forms. She reports FBG's in 90's to 120's  range, pre-lunch in 100-150 range  and pre-dinner and bedtime readings in 150's to 250's range. She continues to drink sugar sweetened beverages at most meals. Most of her lunch meals are Fast Food. She is making an effort to eat more dinner meals at home that her mother prepares that usually consist of meat, starch and vegetables. Instructed on a meal plan for diabetes based on 1700 calories to promote weight loss as well. Also, discussed dietary guidelines for gastroparesis. Encouraged lower fat, low fiber foods to improve gastric emptying.Used calorieking.com website to show patient nutrition information for some of the fast food choices she is making and discussed better options.  Weight 199 lb 9.6 oz (90.538 kg). Body mass index is 33.22 kg/(m^2).       Diabetes Self-Management Education - 09/30/14 1445    Complications   Last HgB A1C per patient/outside source 11.6 %   How often do you check your blood sugar? 3-4 times/day   Fasting Blood glucose range (mg/dL) 16-109   Have you had a dilated eye exam in the past 12 months? Yes   Have you had a dental exam in the past 12 months? No   How many days per week are you checking your feet? 3   Dietary Intake   Breakfast time varies-6-11:30am  2 eggs, bacon or sausage, powerade or cereal/milk   Lunch 1-3:00 Usually fast food; Ex. [employer] chicken pie, mac.'n cheese, sweet tea or [employer]'s cheeseburger, fries or chicken nuggets, sweet tea   Dinner Is eating more meals at home that her mom  prepares; spaghetti or baked chicken, pasta, green vegetable, fruit juice   Beverage(s) main beverage is sweet tea, also drinks water, juice, powerade and some sugar free flavored waters   Exercise   Exercise Type Light (walking / raking leaves)   How many days per week to you exercise? 2   How many minutes per day do you exercise? 45   Total minutes per week of exercise 90   Patient Education   Nutrition management  Role of diet in the treatment of diabetes and the relationship between the three main macronutrients and blood glucose level;Food label reading, portion sizes and measuring food.;Carbohydrate counting;Information on hints to eating out and maintain blood glucose control.  Instructed on a diabetes meal plan based on 1600 calories.   Personal strategies to promote health Helped patient develop diabetes management plan for (enter comment)  meal planning.      Learning Objective:  Patient will have a greater understanding of diabetes self-management. Patient education plan is to attend individual and/or group sessions per assessed needs and concerns.   Plan:   Patient Instructions  Spread 11-12 servings of carbohydrate over 3 meals and 2 snacks per day. Balance meals with protein, 2-3 servings of carbohydrate and "free vegetables". Limit raw vegetables and some raw fruits. Begin decreasing sugar sweetened beverages by drinking half/half with sugar/diet. Drink at least 2 bottle water (16 oz) per day. Use calorieking.com to look up nutrition information for fast food  restaurants. Bring glucose records at next visit.   Education material provided: Planning a Balanced Meal  Future DSME appointment: -   10/17/14 at 3:30pm with RN

## 2014-10-11 ENCOUNTER — Ambulatory Visit: Payer: BLUE CROSS/BLUE SHIELD | Admitting: Gastroenterology

## 2014-10-17 ENCOUNTER — Ambulatory Visit (INDEPENDENT_AMBULATORY_CARE_PROVIDER_SITE_OTHER): Payer: BLUE CROSS/BLUE SHIELD | Admitting: Unknown Physician Specialty

## 2014-10-17 ENCOUNTER — Encounter: Payer: Self-pay | Admitting: Unknown Physician Specialty

## 2014-10-17 ENCOUNTER — Telehealth: Payer: Self-pay

## 2014-10-17 ENCOUNTER — Encounter: Payer: BLUE CROSS/BLUE SHIELD | Attending: Neurology | Admitting: Dietician

## 2014-10-17 VITALS — Wt 196.8 lb

## 2014-10-17 VITALS — BP 117/80 | HR 89 | Temp 99.1°F | Ht 66.0 in | Wt 194.2 lb

## 2014-10-17 DIAGNOSIS — N921 Excessive and frequent menstruation with irregular cycle: Secondary | ICD-10-CM | POA: Diagnosis not present

## 2014-10-17 DIAGNOSIS — K529 Noninfective gastroenteritis and colitis, unspecified: Secondary | ICD-10-CM

## 2014-10-17 DIAGNOSIS — E119 Type 2 diabetes mellitus without complications: Secondary | ICD-10-CM | POA: Insufficient documentation

## 2014-10-17 DIAGNOSIS — Z23 Encounter for immunization: Secondary | ICD-10-CM

## 2014-10-17 DIAGNOSIS — E109 Type 1 diabetes mellitus without complications: Secondary | ICD-10-CM

## 2014-10-17 MED ORDER — PROMETHAZINE HCL 25 MG PO TABS: 25.0000 mg | ORAL_TABLET | Freq: Three times a day (TID) | ORAL | Status: DC | PRN

## 2014-10-17 NOTE — Patient Instructions (Signed)

## 2014-10-17 NOTE — Telephone Encounter (Signed)
Pt added to CW's schedule for today @ 11. Thanks.

## 2014-10-17 NOTE — Assessment & Plan Note (Addendum)
Prescribed promethazine 25 mg tablet as needed for nausea Instructed pt to stay hydrated with fluids Instructed pt to go to the emergency room if she becomes lightheaded, unable to keep fluids down or has hypoglycemic events Provided education handout about Gastroenteritis

## 2014-10-17 NOTE — Assessment & Plan Note (Signed)
Continue taking Norethindrone as prescribed informed pt it may take up to 3 months for menstrual cycle to become regular once oral contraceptive medications are started Instructed to notify office if symptoms worsen or persist

## 2014-10-17 NOTE — Progress Notes (Signed)
BP 117/80 mmHg  Pulse 89  Temp(Src) 99.1 F (37.3 C)  Ht  (1.676 m)  Wt 194 lb 3.2 oz (88.089 kg)  BMI 31.36 kg/m2  SpO2 96%  LMP 10/03/2014 (Approximate)   Subjective:    Patient ID: Kristen Tran, female    DOB: 12/21/1990, 24 y.o.   MRN: [id number]  HPI: Kristen Tran is a 24 y.o. female  Chief Complaint  Patient presents with  . Nausea    pt states she has nausea, vomitting, and diarrhea. States symptoms started yesterday morning around 8 am.  . Menstrual Problem    pt states that with the birth control she has now, she has a period every other week   Nausea and Vomiting The pt presents with c/o nausea, vomiting, diarrhea, fatigue and headache onset yesterday.  She is having to go to the bathroom every hour due to diarrhea and vomiting.  She has not taken any medications.  She has been staying hydrated with water and checking blood sugars.  Pertinent negatives denies fever, palpitations, hypoglycemic events, syncope, recent travel or insect bites  Menstrual Problems Since starting birth control 2 months ago she has been having menstrual cycles every other week and a half.  She takes her birth control daily.  The cycles last for about 5 days with a moderate amount of vaginal bleeding with pelvic pain.  Pertinent negatives denies vaginal pain or vaginal discharge.  Relevant past medical, surgical, family and social history reviewed and updated as indicated. Interim medical history since our last visit reviewed. Allergies and medications reviewed and updated.  Review of Systems  Constitutional: Positive for chills, appetite change and fatigue. Negative for fever.  Respiratory: Negative.   Cardiovascular: Negative for palpitations and leg swelling.  Gastrointestinal: Positive for nausea, vomiting, abdominal pain and diarrhea. Negative for constipation and blood in stool.  Genitourinary: Positive for menstrual problem and pelvic pain.  Negative for dysuria, flank pain and difficulty urinating.  Musculoskeletal:       Bodyaches  Skin: Negative.   Neurological: Positive for weakness and headaches. Negative for seizures and syncope.  Hematological: Negative.     Per HPI unless specifically indicated above     Objective:    BP 117/80 mmHg  Pulse 89  Temp(Src) 99.1 F (37.3 C)  Ht  (1.676 m)  Wt 194 lb 3.2 oz (88.089 kg)  BMI 31.36 kg/m2  SpO2 96%  LMP 10/03/2014 (Approximate)  Wt Readings from Last 3 Encounters:  10/17/14 194 lb 3.2 oz (88.089 kg)  09/30/14 199 lb 9.6 oz (90.538 kg)  09/19/14 196 lb 12.8 oz (89.268 kg)    Physical Exam  Constitutional: She is oriented to person, place, and time. She appears well-developed and well-nourished. No distress.  HENT:  Head: Normocephalic and atraumatic.  Right Ear: External ear normal.  Left Ear: External ear normal.  Nose: Nose normal.  Neck: Normal range of motion. Neck supple.  Cardiovascular: Normal rate, regular rhythm, normal heart sounds and intact distal pulses.   Pulmonary/Chest: Effort normal and breath sounds normal. No respiratory distress. She has no wheezes.  Abdominal: Soft. Bowel sounds are normal. She exhibits no distension and no mass. There is no tenderness. There is no rebound and no guarding.  Musculoskeletal: Normal range of motion. She exhibits no edema or tenderness.  Neurological: She is alert and oriented to person, place, and time.  Skin: Skin is warm and dry. No rash noted. She is not diaphoretic. No erythema. No  pallor.  Psychiatric: She has a normal mood and affect. Her behavior is normal. Judgment and thought content normal.    Results for orders placed or performed in visit on 08/02/14  UA/M w/rflx Culture, Routine  Result Value Ref Range   Specific Gravity, UA 1.005 1.005 - 1.030   pH, UA 5.0 5.0 - 7.5   Color, UA Yellow Yellow   Appearance Ur Clear Clear   Leukocytes, UA Negative Negative   Protein, UA Negative  Negative/Trace   Glucose, UA 3+ (A) Negative   Ketones, UA Trace (A) Negative   RBC, UA Negative Negative   Bilirubin, UA Negative Negative   Urobilinogen, Ur 0.2 0.2 - 1.0 mg/dL   Nitrite, UA Negative Negative  Microalbumin, Urine Waived  Result Value Ref Range   Microalb, Ur Waived 10 0 - 19 mg/L   Creatinine, Urine Waived 50 10 - 300 mg/dL   Microalb/Creat Ratio <30 <30 mg/g  Pap Lb, rfx HPV ASCU  Result Value Ref Range   DIAGNOSIS: Comment    Specimen adequacy: Comment    CLINICIAN PROVIDED ICD10: Comment    Performed by: Comment    PAP SMEAR COMMENT .    Note: Comment    PAP REFLEX: Comment       Assessment & Plan:   Problem List Items Addressed This Visit      Unprioritized   Gastroenteritis    Prescribed promethazine 25 mg tablet as needed for nausea Instructed pt to stay hydrated with fluids Instructed pt to go to the emergency room if she becomes lightheaded, unable to keep fluids down or has hypoglycemic events Provided education handout about Gastroenteritis       Metrorrhagia    Continue taking Norethindrone as prescribed informed pt it may take up to 3 months for menstrual cycle to become regular once oral contraceptive medications are started Instructed to notify office if symptoms worsen or persist        Other Visit Diagnoses    Immunization due    -  Primary    Relevant Orders    Flu Vaccine QUAD 36+ mos PF IM (Fluarix & Fluzone Quad PF) (Completed)        Follow up plan: Return if symptoms worsen or fail to improve.

## 2014-10-18 NOTE — Progress Notes (Signed)
Diabetes Self-Management Education  Visit Type:  Follow-up  Appt. Start Time: 1530 Appt. End Time: 1630  10/18/2014  Ms. Kristen Tran, identified by name and date of birth, is a 24 y.o. female with a diagnosis of Diabetes:  .   ASSESSMENT  Weight 196 lb 12.8 oz (89.268 kg), last menstrual period 10/03/2014. Body mass index is 31.78 kg/(m^2).   Saw pt on 10-17-14 and pt reports having "GI"  bug since yesterday with nausea/vomiting/diarrhea. Pt saw C. Jamesetta Orleans, PA earlier today and was given RX for Phenergan. Pt reports feeling some better today but still unable to eat much. Has been trying to drink more G2. Tried Powerade Zero but doesn't like taste of it. Pt did not take Lantus today-reports BG was 116 one hour prior to appointment. Reports having no low BG's recently       Diabetes Self-Management Education - 10/18/14 0857    Complications   How often do you check your blood sugar? --  pt did not bring BG records-per pt checks 4x/day FBG's and ac lunch BG's 90's-120's;ac supper and bedtime BG's 180's-250's   Have you had a dilated eye exam in the past 12 months? Yes   Have you had a dental exam in the past 12 months? No   Are you checking your feet? Yes   How many days per week are you checking your feet? 4   Dietary Intake   Breakfast --  eats 2-3 meals/day + 1-2 snacks   Exercise   Exercise Type --  walks 30 min 3x/wk + active job   Patient Education   Nutrition management  Carbohydrate counting   Physical activity and exercise  Role of exercise on diabetes management, blood pressure control and cardiac health.   Medications Reviewed medication adjustment guidelines for hyperglycemia and sick days.;Reviewed patients medication for diabetes, action, purpose, timing of dose and side effects.   Monitoring Purpose and frequency of SMBG.;Taught/evaluated SMBG meter.;Identified appropriate SMBG and/or A1C goals.   Acute complications Taught treatment of hypoglycemia - the 15  rule.;Discussed and identified patients' treatment of hyperglycemia.;Covered sick day management with medication and food.   Psychosocial adjustment Role of stress on diabetes;Worked with patient to identify barriers to care and solutions   Preconception care Pregnancy and GDM  Role of pre-pregnancy blood glucose control on the development of the fetus;Role of family planning for patients with diabetes  Reviewed carb counting  with pt. Discussed possibly using an ICR for meal dosing and switching to Toujeo  for basal insulin-pt to consult with endocrinologist  about this. Reviewed causes of high/low BG's along with treatment for low BG's.     Learning Objective:  Patient will have a greater understanding of diabetes self-management. Patient education plan is to attend individual and/or group sessions per assessed needs and concerns.   Plan:   Patient Instructions   Drink lots of sugar free liquids-try broth, sugar free jello during illness Take Phenergan for nausea and vomiting as directed by MD Dilute G2 with water to lower content of sugar Avoid regular sodas  Eat 45-50 grams carbs/meal + protein-eat 3 meals/day Eat 15 gram carb/snack + protein Carry candy or glucose tablets at all times Check BG's frequently during illness-likely will need Lantus to control BG's  Ask endocrinologist about Toujeo and possibly using an ICR for meal boluses  Complete a 3 day food record and estimate carbohydrate grams Next appointment with a dietitian on 11-09-14     Expected Outcomes:  Education material provided: Stress Management tool packet, causes/treatment of high/low BG's, sick day care  If problems or questions, patient to contact team via:  [phone]  Future DSME appointment: -  11-09-14

## 2014-10-18 NOTE — Patient Instructions (Addendum)
  Drink lots of sugar free liquids-try broth, sugar free jello during illness Take Phenergan for nausea and vomiting as directed by MD Dilute G2 with water to lower content of sugar Avoid regular sodas  Eat 45-50 grams carbs/meal + protein-eat 3 meals/day Eat 15 gram carb/snack + protein Carry candy or glucose tablets at all times Check BG's frequently during illness-likely will need Lantus to control BG's  Ask endocrinologist about Toujeo and possibly using an ICR for meal boluses  Complete a 3 day food record and estimate carbohydrate grams Next appointment with a dietitian on 11-09-14

## 2014-10-24 ENCOUNTER — Telehealth: Payer: Self-pay | Admitting: Dietician

## 2014-10-24 NOTE — Telephone Encounter (Signed)
Called pt -no answer-left message for pt to call with BG's

## 2014-10-31 ENCOUNTER — Telehealth: Payer: Self-pay | Admitting: Dietician

## 2014-10-31 NOTE — Telephone Encounter (Signed)
Pt called and left message-GI upset is better but now has a head cold; BG's much improved; saw endocrinologist on 10-28-14 and A1C was better; pt is carbohydrate counting all the time

## 2014-11-02 ENCOUNTER — Encounter: Payer: Self-pay | Admitting: Unknown Physician Specialty

## 2014-11-02 ENCOUNTER — Ambulatory Visit (INDEPENDENT_AMBULATORY_CARE_PROVIDER_SITE_OTHER): Payer: BLUE CROSS/BLUE SHIELD | Admitting: Unknown Physician Specialty

## 2014-11-02 VITALS — BP 148/98 | HR 88 | Temp 98.8°F | Ht 65.7 in | Wt 205.8 lb

## 2014-11-02 DIAGNOSIS — I10 Essential (primary) hypertension: Secondary | ICD-10-CM | POA: Diagnosis not present

## 2014-11-02 MED ORDER — METOPROLOL SUCCINATE ER 100 MG PO TB24: 100.0000 mg | ORAL_TABLET | Freq: Every day | ORAL | Status: DC

## 2014-11-02 NOTE — Progress Notes (Signed)
   BP 148/98 mmHg  Pulse 88  Temp(Src) 98.8 F (37.1 C)  Ht 5' 5.7" (1.669 m)  Wt 205 lb 12.8 oz (93.35 kg)  BMI 33.51 kg/m2  SpO2 97%  LMP 10/03/2014 (Approximate)   Subjective:    Patient ID: Kristen Tran, female    DOB: May 05, 1990, 24 y.o.   MRN: [id number]  HPI: Kristen Tran is a 24 y.o. female  Chief Complaint  Patient presents with  . Diabetes  . Hyperlipidemia  . Hypertension   Diabetes Stats she saw her Endocrinologist recently and definitely improving and moved from every 6 month visits.    Hypertension BP is high here and at the endocrinologist office.  She is of child bearing age and we are reluctant to start Lisinopril.  No chest pain, SOB, dizzyness, and edema.  She is good about taking her medication on a regular basis.     Relevant past medical, surgical, family and social history reviewed and updated as indicated. Interim medical history since our last visit reviewed. Allergies and medications reviewed and updated.  Review of Systems  Constitutional: Negative.   Respiratory: Negative.   Cardiovascular: Negative.   Genitourinary:       Menstrual irregularities are starting to become more regular    Per HPI unless specifically indicated above     Objective:    BP 148/98 mmHg  Pulse 88  Temp(Src) 98.8 F (37.1 C)  Ht 5' 5.7" (1.669 m)  Wt 205 lb 12.8 oz (93.35 kg)  BMI 33.51 kg/m2  SpO2 97%  LMP 10/03/2014 (Approximate)  Wt Readings from Last 3 Encounters:  11/02/14 205 lb 12.8 oz (93.35 kg)  10/18/14 196 lb 12.8 oz (89.268 kg)  10/17/14 194 lb 3.2 oz (88.089 kg)    Physical Exam  Constitutional: She is oriented to person, place, and time. She appears well-developed and well-nourished. No distress.  HENT:  Head: Normocephalic and atraumatic.  Eyes: Conjunctivae and lids are normal. Right eye exhibits no discharge. Left eye exhibits no discharge. No scleral icterus.  Cardiovascular: Normal rate and  regular rhythm.   Murmur heard. Pulmonary/Chest: Effort normal and breath sounds normal. No respiratory distress.  Abdominal: Normal appearance. There is no splenomegaly or hepatomegaly.  Musculoskeletal: Normal range of motion.  Neurological: She is alert and oriented to person, place, and time.  Skin: Skin is intact. No rash noted. No pallor.  Psychiatric: She has a normal mood and affect. Her behavior is normal. Judgment and thought content normal.    Assessment & Plan:   Problem List Items Addressed This Visit      Unprioritized   Hypertension - Primary    Increase Metoprolol from 50 mg to 100 mg.        Relevant Medications   metoprolol succinate (TOPROL-XL) 100 MG 24 hr tablet       Follow up plan: Return in about 4 weeks (around 11/30/2014).

## 2014-11-02 NOTE — Assessment & Plan Note (Signed)
Increase Metoprolol from 50 mg to 100 mg.

## 2014-11-09 ENCOUNTER — Encounter: Payer: BLUE CROSS/BLUE SHIELD | Attending: Neurology | Admitting: Dietician

## 2014-11-09 ENCOUNTER — Encounter: Payer: Self-pay | Admitting: Dietician

## 2014-11-09 VITALS — BP 140/84 | Ht 65.0 in | Wt 203.0 lb

## 2014-11-09 DIAGNOSIS — E119 Type 2 diabetes mellitus without complications: Secondary | ICD-10-CM | POA: Insufficient documentation

## 2014-11-09 DIAGNOSIS — E109 Type 1 diabetes mellitus without complications: Secondary | ICD-10-CM

## 2014-11-09 NOTE — Patient Instructions (Signed)
To use myfitness pal app to track food intake. To use exercise video 15-30 minutes. Start with 1 day per week with goal to increase. Continue walking for exercise 2 days/week. To read labels for sodium with goal of no more than 1500mg  sodium per day, recognizing that this is a process.  To limit added fats such as mayonnaise, margarine or salad dressings. To limit raw fruits/vegetables as can cause slower gastric emptying.

## 2014-11-09 NOTE — Progress Notes (Signed)
Diabetes Self-Management Education  Visit Type:  Follow-up  Appt. Start Time: 9:00am Appt. End Time: 10:00am  11/09/2014  Kristen Tran, identified by name and date of birth, is a 24 y.o. female with a diagnosis of Diabetes:  .Type 1 diabetes   ASSESSMENT Patient expressed concern that even though her blood sugars have improved, she has gained weight. Reviewed her meal plan previously instructed on based on 1700 calories. Based on her typical food/beverage intake, she is not exceeding recommended carbohydrate amounts. Her goal at meals is 45-50 grams of carbohydrate. Discussed adding fruit as part of carbohydrate at some of her lunch meals or snacks in place of chips to help lower both fat and sodium in the meals. Discussed importance of more consistent structured exercise to help with weight loss. Also, encouraged to track her food/beverage intake for 1-2 weeks using a phone app to see if there are "hidden calories" especially fat grams.  Discussed how a high sodium intake can cause fluctuations in weight and reviewed sodium guidelines.  Blood pressure 140/84, height  (1.651 m), weight 203 lb (92.08 kg), last menstrual period 10/03/2014. Body mass index is 33.78 kg/(m^2).       Diabetes Self-Management Education - 11/09/14 1015    Complications   Last HgB A1C per patient/outside source 10.7 %   How often do you check your blood sugar? 3-4 times/day   Fasting Blood glucose range (mg/dL) 16-109   Postprandial Blood glucose range (mg/dL) 604-540   Have you had a dilated eye exam in the past 12 months? Yes   Have you had a dental exam in the past 12 months? No   Are you checking your feet? Yes   How many days per week are you checking your feet? 3   Dietary Intake   Breakfast  8:30 or 10:00 depending on work schedule:     eggs, bacon or sausage several days/week, 3/4 to 1 cup grits   Snack (morning) granola bar or small bag of pretzels or chips -only eats this snack on  days that breakfast is 8:30am.   Lunch 3:30 - [place] or chicken sandwich, chips, G2 gaterade or diet pepsi   Snack (afternoon) granola bar or chips   Dinner 7:30 or 8:00- most meals are prepared at home- baked chicken or fish usually with starch such as rice or corn and green vegetables.   Snack (evening) usually no snack   Beverage(s) Has switched from sweetened beverages to water or sugar free beverages.   Exercise   Exercise Type Light (walking / raking leaves)   How many days per week to you exercise? 2   How many minutes per day do you exercise? 30   Total minutes per week of exercise 60   Patient Education   Nutrition management  Role of diet in the treatment of diabetes and the relationship between the three main macronutrients and blood glucose level;Food label reading, portion sizes and measuring food.;Carbohydrate counting;Reviewed blood glucose goals for pre and post meals and how to evaluate the patients' food intake on their blood glucose level.;Meal options for control of blood glucose level and chronic complications.;Other (comment)  Dietary guidelines for gastroparesis. Also sodium  guidelines for hypertension   Chronic complications Relationship between chronic complications and blood glucose control;Lipid levels, blood glucose control and heart disease   Preconception care Pregnancy and GDM  Role of pre-pregnancy blood glucose control on the development of the fetus;Reviewed with patient blood glucose goals with pregnancy  Learning Objective:  Patient will have a greater understanding of diabetes self-management. Patient education plan is to attend individual and/or group sessions per assessed needs and concerns.   Plan:   Patient Instructions  To use myfitness pal app to track food intake. To use exercise video 15-30 minutes. Start with 1 day per week with goal to increase. Continue walking for exercise 2 days/week. To read labels for sodium with goal of no more  than 1500mg  sodium per day, recognizing that this is a process.  To limit added fats such as mayonnaise, margarine or salad dressings. To limit raw fruits/vegetables as can cause slower gastric emptying.    Education material provided:  List of high sodium foods and lower sodium alternatives. General dietary guidelines for gastroparesis.  If problems or questions, patient to contact team via: Darrel Reacholleen Breyon Blass, RD  [phone]  Future DSME appointment: - 2 months; Patient requested to set follow-up in 2 months. Scheduled with [place] BlasHilda Cook, RN for 01/16/15 at 10:30am.

## 2014-11-17 ENCOUNTER — Telehealth: Payer: Self-pay | Admitting: Unknown Physician Specialty

## 2014-11-17 NOTE — Telephone Encounter (Signed)
pts mom called and says the pt is having a eczema flare up and would like to have cream called in to [facility] [address]

## 2014-11-18 MED ORDER — TRIAMCINOLONE ACETONIDE 0.1 % EX CREA: 1.0000 "application " | TOPICAL_CREAM | Freq: Two times a day (BID) | CUTANEOUS | Status: DC

## 2014-11-18 NOTE — Telephone Encounter (Signed)
Called and let patient's mother know rx was sent to pharmacy.

## 2014-11-18 NOTE — Telephone Encounter (Signed)
done

## 2014-11-18 NOTE — Telephone Encounter (Signed)
Routing to provider  

## 2014-11-28 ENCOUNTER — Ambulatory Visit: Payer: BLUE CROSS/BLUE SHIELD | Admitting: Unknown Physician Specialty

## 2014-12-05 ENCOUNTER — Other Ambulatory Visit: Payer: Self-pay | Admitting: Unknown Physician Specialty

## 2014-12-05 MED ORDER — TRIAMCINOLONE ACETONIDE 0.1 % EX CREA: 1.0000 "application " | TOPICAL_CREAM | Freq: Two times a day (BID) | CUTANEOUS | Status: DC

## 2014-12-05 NOTE — Telephone Encounter (Signed)
Routing to provider  

## 2014-12-05 NOTE — Telephone Encounter (Signed)
Pt would like to have refill on kenalog cream sent to [facility] [address]

## 2015-01-03 ENCOUNTER — Encounter: Payer: Self-pay | Admitting: Family Medicine

## 2015-01-03 ENCOUNTER — Ambulatory Visit (INDEPENDENT_AMBULATORY_CARE_PROVIDER_SITE_OTHER): Payer: BLUE CROSS/BLUE SHIELD | Admitting: Family Medicine

## 2015-01-03 VITALS — BP 147/98 | HR 81 | Temp 98.9°F | Ht 65.3 in | Wt 195.0 lb

## 2015-01-03 DIAGNOSIS — J069 Acute upper respiratory infection, unspecified: Secondary | ICD-10-CM

## 2015-01-03 DIAGNOSIS — R509 Fever, unspecified: Secondary | ICD-10-CM | POA: Diagnosis not present

## 2015-01-03 DIAGNOSIS — R0981 Nasal congestion: Secondary | ICD-10-CM

## 2015-01-03 LAB — PLEASE NOTE:

## 2015-01-03 LAB — INFLUENZA A AND B
Influenza A Ag, EIA: NEGATIVE
Influenza B Ag, EIA: NEGATIVE

## 2015-01-03 MED ORDER — BENZONATATE 200 MG PO CAPS: 200.0000 mg | ORAL_CAPSULE | Freq: Three times a day (TID) | ORAL | Status: DC | PRN

## 2015-01-03 MED ORDER — PREDNISONE 10 MG PO TABS: 30.0000 mg | ORAL_TABLET | Freq: Every day | ORAL | Status: DC

## 2015-01-03 MED ORDER — HYDROCOD POLST-CPM POLST ER 10-8 MG/5ML PO SUER: 5.0000 mL | Freq: Every evening | ORAL | Status: DC | PRN

## 2015-01-03 NOTE — Progress Notes (Signed)
BP 147/98 mmHg  Pulse 81  Temp(Src) 98.9 F (37.2 C)  Ht 5' 5.3" (1.659 m)  Wt 195 lb (88.451 kg)  BMI 32.14 kg/m2  SpO2 99%  LMP 12/01/2014 (Approximate)   Subjective:    Patient ID: Kristen Tran, female    DOB: 06-02-1990, 24 y.o.   MRN: [id number]  HPI: Kristen Tran is a 24 y.o. female  Chief Complaint  Patient presents with  . URI    X 1 week   UPPER RESPIRATORY TRACT INFECTION- started a cough about a week ago, has been getting sicker and sicker Worst symptom: running nose Fever: yes Cough: yes Shortness of breath: yes Wheezing: no Chest pain: no Chest tightness: yes Chest congestion: yes Nasal congestion: yes Runny nose: yes Post nasal drip: yes Sneezing: yes Sore throat: yes Swollen glands: yes Sinus pressure: yes Headache: yes Face pain: no Toothache: yes Ear pain: no  Ear pressure: yes bilateral Eyes red/itching:yes Eye drainage/crusting: yes  Vomiting: no Rash: no Fatigue: yes Sick contacts: yes Strep contacts: no  Context: worse Recurrent sinusitis: no Relief with OTC cold/cough medications: no  Treatments attempted: none   Relevant past medical, surgical, family and social history reviewed and updated as indicated. Interim medical history since our last visit reviewed. Allergies and medications reviewed and updated.  Review of Systems  Constitutional: Negative.   HENT: Positive for congestion, postnasal drip, rhinorrhea, sinus pressure, sneezing and sore throat. Negative for dental problem, drooling, ear discharge, ear pain, facial swelling, hearing loss, mouth sores, nosebleeds, tinnitus, trouble swallowing and voice change.   Respiratory: Negative.   Cardiovascular: Negative.   Gastrointestinal: Negative.   Psychiatric/Behavioral: Negative.     Per HPI unless specifically indicated above     Objective:    BP 147/98 mmHg  Pulse 81  Temp(Src) 98.9 F (37.2 C)  Ht 5' 5.3" (1.659 m)  Wt 195 lb  (88.451 kg)  BMI 32.14 kg/m2  SpO2 99%  LMP 12/01/2014 (Approximate)  Wt Readings from Last 3 Encounters:  01/03/15 195 lb (88.451 kg)  11/09/14 203 lb (92.08 kg)  11/02/14 205 lb 12.8 oz (93.35 kg)    Physical Exam  Constitutional: She is oriented to person, place, and time. She appears well-developed and well-nourished. No distress.  HENT:  Head: Normocephalic and atraumatic.  Right Ear: Hearing and external ear normal.  Left Ear: Hearing normal.  Nose: Mucosal edema and rhinorrhea present. No nose lacerations, sinus tenderness, nasal deformity, septal deviation or nasal septal hematoma. No epistaxis.  No foreign bodies. Right sinus exhibits no maxillary sinus tenderness and no frontal sinus tenderness. Left sinus exhibits no maxillary sinus tenderness and no frontal sinus tenderness.  Mouth/Throat: Uvula is midline, oropharynx is clear and moist and mucous membranes are normal. No oropharyngeal exudate.  Eyes: Conjunctivae, EOM and lids are normal. Pupils are equal, round, and reactive to light. Right eye exhibits no discharge. Left eye exhibits no discharge. No scleral icterus.  Neck: Normal range of motion. Neck supple. No JVD present. No tracheal deviation present. No thyromegaly present.  Cardiovascular: Normal rate, regular rhythm, normal heart sounds and intact distal pulses.  Exam reveals no gallop and no friction rub.   No murmur heard. Pulmonary/Chest: Effort normal and breath sounds normal. No stridor. No respiratory distress. She has no wheezes. She has no rales. She exhibits no tenderness.  Musculoskeletal: Normal range of motion.  Lymphadenopathy:    She has cervical adenopathy.  Neurological: She is alert and oriented to person, place, and  time.  Skin: Skin is intact. No rash noted. She is not diaphoretic.  Psychiatric: She has a normal mood and affect. Her speech is normal and behavior is normal. Judgment and thought content normal. Cognition and memory are normal.   Nursing note and vitals reviewed.   Results for orders placed or performed in visit on 01/03/15  Influenza a and b  Result Value Ref Range   Influenza A Ag, EIA Negative Negative   Influenza B Ag, EIA Negative Negative   Influenza Comment See note   Please note:  Result Value Ref Range   Please note: Comment       Assessment & Plan:   Problem List Items Addressed This Visit    None    Visit Diagnoses    Upper respiratory infection    -  Primary    Will treat with prednisone for 4 days- WATCH DIET AND SUGARS, symptomatic treatment. Continue to monitor.     Nasal congestion        Relevant Orders    Influenza a and b (Completed)    Fever, unspecified fever cause        Relevant Orders    Influenza a and b (Completed)        Follow up plan: Return if symptoms worsen or fail to improve.

## 2015-01-06 ENCOUNTER — Encounter: Payer: Self-pay | Admitting: Family Medicine

## 2015-01-06 ENCOUNTER — Telehealth: Payer: Self-pay | Admitting: Family Medicine

## 2015-01-06 ENCOUNTER — Other Ambulatory Visit: Payer: Self-pay | Admitting: Family Medicine

## 2015-01-06 MED ORDER — AMOXICILLIN-POT CLAVULANATE 875-125 MG PO TABS: 1.0000 | ORAL_TABLET | Freq: Two times a day (BID) | ORAL | Status: DC

## 2015-01-06 MED ORDER — TRIAMCINOLONE ACETONIDE 0.1 % EX CREA: 1.0000 "application " | TOPICAL_CREAM | Freq: Two times a day (BID) | CUTANEOUS | Status: DC

## 2015-01-06 NOTE — Telephone Encounter (Signed)
Forward to provider

## 2015-01-06 NOTE — Telephone Encounter (Signed)
Routing to provider  

## 2015-01-06 NOTE — Telephone Encounter (Signed)
Ok for note- Elnita MaxwellCheryl is her PCP, and I just saw her for sick visit. [facility].

## 2015-01-06 NOTE — Telephone Encounter (Signed)
Pt was told to call back if she wasn't feeling any better and she would like to know if there was going to be a change in meds or if she needed to come back in.

## 2015-01-06 NOTE — Telephone Encounter (Signed)
Elnita Maxwellheryl, please see message below regarding refill on cream for patient. Thanks.

## 2015-01-06 NOTE — Telephone Encounter (Signed)
Patient notified

## 2015-01-06 NOTE — Telephone Encounter (Signed)
Pt came in stated she needs a note for work. Pt also stated she needs a refill on Kenalog cream. Pharm is [facility] [address]. Thanks.  Pt stated she needs note through Monday due to the fact she is still not feeling better even with medications, and it is hard for her to breathe if she moves to fast. Please advise.

## 2015-01-06 NOTE — Telephone Encounter (Signed)
Antibiotic called into her pharmacy. Let us know if she's not getting better.

## 2015-01-16 ENCOUNTER — Ambulatory Visit: Payer: BLUE CROSS/BLUE SHIELD | Admitting: Dietician

## 2015-01-24 ENCOUNTER — Ambulatory Visit: Payer: BLUE CROSS/BLUE SHIELD | Admitting: Dietician

## 2015-01-29 ENCOUNTER — Emergency Department
Admission: EM | Admit: 2015-01-29 | Discharge: 2015-01-29 | Disposition: A | Discharge status: Home / Self Care | Payer: BLUE CROSS/BLUE SHIELD | Attending: Emergency Medicine | Admitting: Emergency Medicine

## 2015-01-29 ENCOUNTER — Encounter: Payer: Self-pay | Admitting: Emergency Medicine

## 2015-01-29 DIAGNOSIS — Z794 Long term (current) use of insulin: Secondary | ICD-10-CM | POA: Diagnosis not present

## 2015-01-29 DIAGNOSIS — Z79899 Other long term (current) drug therapy: Secondary | ICD-10-CM | POA: Diagnosis not present

## 2015-01-29 DIAGNOSIS — L0291 Cutaneous abscess, unspecified: Secondary | ICD-10-CM

## 2015-01-29 DIAGNOSIS — Z7984 Long term (current) use of oral hypoglycemic drugs: Secondary | ICD-10-CM | POA: Diagnosis not present

## 2015-01-29 DIAGNOSIS — E119 Type 2 diabetes mellitus without complications: Secondary | ICD-10-CM | POA: Insufficient documentation

## 2015-01-29 DIAGNOSIS — J029 Acute pharyngitis, unspecified: Secondary | ICD-10-CM | POA: Diagnosis present

## 2015-01-29 DIAGNOSIS — I10 Essential (primary) hypertension: Secondary | ICD-10-CM | POA: Insufficient documentation

## 2015-01-29 DIAGNOSIS — Z792 Long term (current) use of antibiotics: Secondary | ICD-10-CM | POA: Insufficient documentation

## 2015-01-29 DIAGNOSIS — L02215 Cutaneous abscess of perineum: Secondary | ICD-10-CM | POA: Insufficient documentation

## 2015-01-29 DIAGNOSIS — Z7952 Long term (current) use of systemic steroids: Secondary | ICD-10-CM | POA: Diagnosis not present

## 2015-01-29 LAB — GLUCOSE, CAPILLARY: Glucose-Capillary: 326 mg/dL — ABNORMAL HIGH (ref 65–99)

## 2015-01-29 MED ORDER — TRAMADOL HCL 50 MG PO TABS: 50.0000 mg | ORAL_TABLET | Freq: Four times a day (QID) | ORAL | Status: DC | PRN

## 2015-01-29 MED ORDER — LIDOCAINE HCL (PF) 1 % IJ SOLN
5.0000 mL | Freq: Once | INTRAMUSCULAR | Status: AC
  Administered 2015-01-29: 5 mL via INTRADERMAL

## 2015-01-29 MED ORDER — LIDOCAINE HCL (PF) 1 % IJ SOLN
INTRAMUSCULAR | Status: AC
  Administered 2015-01-29: 5 mL via INTRADERMAL
  Filled 2015-01-29: qty 5

## 2015-01-29 MED ORDER — BUPIVACAINE HCL (PF) 0.5 % IJ SOLN
50.0000 mL | Freq: Once | INTRAMUSCULAR | Status: AC
  Administered 2015-01-29: 50 mL

## 2015-01-29 MED ORDER — BUPIVACAINE HCL (PF) 0.5 % IJ SOLN
INTRAMUSCULAR | Status: AC
  Administered 2015-01-29: 50 mL
  Filled 2015-01-29: qty 30

## 2015-01-29 MED ORDER — SULFAMETHOXAZOLE-TRIMETHOPRIM 800-160 MG PO TABS: 1.0000 | ORAL_TABLET | Freq: Two times a day (BID) | ORAL | Status: DC

## 2015-01-29 MED ORDER — BUPIVACAINE HCL (PF) 0.25 % IJ SOLN
INTRAMUSCULAR | Status: AC
  Filled 2015-01-29: qty 30

## 2015-01-29 NOTE — ED Notes (Signed)
Per EDT in triage, pt's POC strep was negative, lab called regarding culture

## 2015-01-29 NOTE — ED Notes (Signed)
Pt report left side of vagina has abscess x 1 week and sore throat x 2 days.  PT denies any vaginal discharge but states there was some abnormal odor.  Pt NAD at this time, respirations equal and unlabored, skin warm and dry.

## 2015-01-29 NOTE — Discharge Instructions (Signed)
Please seek medical attention for any high fevers, chest pain, shortness of breath, change in behavior, persistent vomiting, bloody stool or any other new or concerning symptoms. ° ° °Abscess °An abscess is an infected area that contains a collection of pus and debris. It can occur in almost any part of the body. An abscess is also known as a furuncle or boil. °CAUSES  °An abscess occurs when tissue gets infected. This can occur from blockage of oil or sweat glands, infection of hair follicles, or a minor injury to the skin. As the body tries to fight the infection, pus collects in the area and creates pressure under the skin. This pressure causes pain. People with weakened immune systems have difficulty fighting infections and get certain abscesses more often.  °SYMPTOMS °Usually an abscess develops on the skin and becomes a painful mass that is red, warm, and tender. If the abscess forms under the skin, you may feel a moveable soft area under the skin. Some abscesses break open (rupture) on their own, but most will continue to get worse without care. The infection can spread deeper into the body and eventually into the bloodstream, causing you to feel ill.  °DIAGNOSIS  °Your caregiver will take your medical history and perform a physical exam. A sample of fluid may also be taken from the abscess to determine what is causing your infection. °TREATMENT  °Your caregiver may prescribe antibiotic medicines to fight the infection. However, taking antibiotics alone usually does not cure an abscess. Your caregiver may need to make a small cut (incision) in the abscess to drain the pus. In some cases, gauze is packed into the abscess to reduce pain and to continue draining the area. °HOME CARE INSTRUCTIONS  °· Only take over-the-counter or prescription medicines for pain, discomfort, or fever as directed by your caregiver. °· If you were prescribed antibiotics, take them as directed. Finish them even if you start to feel  better. °· If gauze is used, follow your caregiver's directions for changing the gauze. °· To avoid spreading the infection: °¨ Keep your draining abscess covered with a bandage. °¨ Wash your hands well. °¨ Do not share personal care items, towels, or whirlpools with others. °¨ Avoid skin contact with others. °· Keep your skin and clothes clean around the abscess. °· Keep all follow-up appointments as directed by your caregiver. °SEEK MEDICAL CARE IF:  °· You have increased pain, swelling, redness, fluid drainage, or bleeding. °· You have muscle aches, chills, or a general ill feeling. °· You have a fever. °MAKE SURE YOU:  °· Understand these instructions. °· Will watch your condition. °· Will get help right away if you are not doing well or get worse. °  °This information is not intended to replace advice given to you by your health care provider. Make sure you discuss any questions you have with your health care provider. °  °Document Released: 10/03/2004 Document Revised: 06/25/2011 Document Reviewed: 03/08/2011 °Elsevier Interactive Patient Education ©2016 [employer] ° °

## 2015-01-29 NOTE — ED Provider Notes (Signed)
[facility] Emergency Department Provider Note   ____________________________________________  Time seen: 0600  I have reviewed the triage vital signs and the nursing notes.   HISTORY  Chief Complaint Abscess and Sore Throat   History limited by: Not Limited   HPI Kristen Tran is a 25 y.o. female who comes in to the emergency department today with 2 complaints. Her first complaint is for a sore throat. She states this is been going on for the past roughly 24 hours. It has gotten progressively worse. She describes it as burning. It is located in the back of her throat. It is associated with some neck swelling. She states it is worse when she swallows. Denies any difficulty breathing. Her second complaint is for an abscess. It is located over the mons. She states this started roughly 1 week ago. She thinks initially did drain a little however it stopped draining is gone worse. She describes the pain as being severe. She denies any fevers. Denies any nausea or vomiting. States she has had 2 abscesses in the past.     Past Medical History  Diagnosis Date  . Asthma   . GERD (gastroesophageal reflux disease)   . Anemia   . Diabetes mellitus without complication (HCC)   . Atopic dermatitis   . Bronchopneumonia   . Hypertension   . Obesity   . Anxiety   . Depression   . Gastroparesis     Patient Active Problem List   Diagnosis Date Noted  . Gastroenteritis 10/17/2014  . Metrorrhagia 10/17/2014  . Asthma 07/07/2014  . GERD (gastroesophageal reflux disease) 07/07/2014  . Anemia 07/07/2014  . Diabetes (HCC) 07/07/2014  . Paresthesia 07/07/2014  . Atopic dermatitis 07/07/2014  . Bronchopneumonia 07/07/2014  . Hyperlipidemia 07/07/2014  . Hypertension 07/07/2014  . Obesity 07/07/2014  . Acute anxiety 07/07/2014  . Eczema 07/07/2014    Past Surgical History  Procedure Laterality Date  . Cholecystectomy    . Fracture surgery     . Wisdom tooth extraction      Current Outpatient Rx  Name  Route  Sig  Dispense  Refill  . albuterol (VENTOLIN HFA) 108 (90 BASE) MCG/ACT inhaler   Inhalation   Inhale 2 puffs into the lungs every 6 (six) hours as needed for wheezing or shortness of breath.         . cetirizine (ZYRTEC) 10 MG tablet   Oral   Take 10 mg by mouth daily. As needed for allergies         . ferrous fumarate (HEMOCYTE - 106 MG FE) 325 (106 FE) MG TABS tablet   Oral   Take 1 tablet by mouth.         Marland Kitchen glucose blood test strip   Other   1 each by Other route as needed for other. Use as instructed         . insulin aspart (NOVOLOG) 100 UNIT/ML injection   Subcutaneous   Inject into the skin 3 (three) times daily before meals. 100 unit/ml Per sliding scale:  90-150=15 units, 151-180=17 units, >180=20 units  Also takes Novolog before eating snacks         . Insulin Glargine (LANTUS SOLOSTAR) 100 UNIT/ML Solostar Pen   Subcutaneous   Inject 140 Units into the skin daily. In AM         . Iron TABS   Oral   Take 65 mg by mouth daily.         Marland Kitchen  metFORMIN (GLUCOPHAGE) 1000 MG tablet   Oral   Take 500 mg by mouth 3 (three) times daily with meals.          . metoCLOPramide (REGLAN) 10 MG tablet   Oral   Take 10 mg by mouth 2 (two) times daily. With meals         . metoprolol succinate (TOPROL-XL) 100 MG 24 hr tablet   Oral   Take 1 tablet (100 mg total) by mouth daily. Take with or immediately following a meal.   90 tablet   0   . montelukast (SINGULAIR) 10 MG tablet   Oral   Take 10 mg by mouth at bedtime. As needed         . norethindrone (ORTHO MICRONOR) 0.35 MG tablet   Oral   Take 1 tablet (0.35 mg total) by mouth daily.   1 Package   11   . omeprazole (PRILOSEC) 20 MG capsule   Oral   Take 20 mg by mouth daily.         . promethazine (PHENERGAN) 25 MG tablet   Oral   Take 1 tablet (25 mg total) by mouth every 8 (eight) hours as needed for nausea or  vomiting.   20 tablet   0   . triamcinolone cream (KENALOG) 0.1 %   Topical   Apply 1 application topically 2 (two) times daily.   30 g   0   . amoxicillin-clavulanate (AUGMENTIN) 875-125 MG tablet   Oral   Take 1 tablet by mouth 2 (two) times daily.   20 tablet   0   . benzonatate (TESSALON) 200 MG capsule   Oral   Take 1 capsule (200 mg total) by mouth 3 (three) times daily as needed for cough.   20 capsule   0   . chlorpheniramine-HYDROcodone (TUSSIONEX PENNKINETIC ER) 10-8 MG/5ML SUER   Oral   Take 5 mLs by mouth at bedtime as needed.   140 mL   0   . cyclobenzaprine (FLEXERIL) 10 MG tablet   Oral   Take 10 mg by mouth at bedtime. For anxiety         . predniSONE (DELTASONE) 10 MG tablet   Oral   Take 3 tablets (30 mg total) by mouth daily with breakfast.   12 tablet   0     Allergies Bee venom; Contrast media; Ibuprofen; Morphine sulfate; Tomato; and Ioxaglate  Family History  Problem Relation Age of Onset  . Diabetes Mother   . Diabetes Father   . Hypertension Father   . Heart disease Father   . Diabetes Sister   . Diabetes Brother   . Diabetes Maternal Grandmother   . Hypertension Maternal Grandmother   . Diabetes Maternal Grandfather   . Hypertension Maternal Grandfather   . Diabetes Paternal Grandmother   . Hypertension Paternal Grandmother   . Diabetes Paternal Grandfather   . Hypertension Paternal Grandfather     Social History Social History  Substance Use Topics  . Smoking status: Never Smoker   . Smokeless tobacco: Never Used  . Alcohol Use: 0.0 - 1.2 oz/week    0-2 Standard drinks or equivalent per week     Comment: on occasion    Review of Systems  Constitutional: Negative for fever. Cardiovascular: Negative for chest pain. Respiratory: Negative for shortness of breath. Gastrointestinal: Negative for abdominal pain, vomiting and diarrhea. Skin: Positive for abscess in the pubic region. Neurological: Negative for  headaches, focal weakness or  numbness.   10-point ROS otherwise negative.  ____________________________________________   PHYSICAL EXAM:  VITAL SIGNS: ED Triage Vitals  Enc Vitals Group     BP 01/29/15 0253 157/99 mmHg     Pulse Rate 01/29/15 0253 112     Resp 01/29/15 0253 20     Temp 01/29/15 0253 98.4 F (36.9 C)     Temp Source 01/29/15 0253 Oral     SpO2 01/29/15 0253 96 %     Weight 01/29/15 0253 194 lb (87.998 kg)     Height 01/29/15 0253  (1.651 m)     Head Cir --      Peak Flow --      Pain Score 01/29/15 0254 9   Constitutional: Alert and oriented. Well appearing and in no distress. Eyes: Conjunctivae are normal. PERRL. Normal extraocular movements. ENT   Head: Normocephalic and atraumatic.   Nose: No congestion/rhinnorhea.   Mouth/Throat: Mucous membranes are moist. Mild pharyngeal swelling however no exudates. No uvula deviation.   Neck: No stridor. Mildly tender to palpation bilaterally. Hematological/Lymphatic/Immunilogical: No cervical lymphadenopathy. Cardiovascular: Normal rate, regular rhythm.  No murmurs, rubs, or gallops. Respiratory: Normal respiratory effort without tachypnea nor retractions. Breath sounds are clear and equal bilaterally. No wheezes/rales/rhonchi. Gastrointestinal: Soft and nontender. No distention.  Genitourinary: Deferred Musculoskeletal: Normal range of motion in all extremities. No joint effusions.  No lower extremity tenderness nor edema. Neurologic:  Normal speech and language. No gross focal neurologic deficits are appreciated.  Skin:  Roughly 1 inch diameter area of erythema and fluctuance overlying the mons. Tender to palpation. Psychiatric: Mood and affect are normal. Speech and behavior are normal. Patient exhibits appropriate insight and judgment.  ____________________________________________    LABS (pertinent positives/negatives)  Labs Reviewed  GLUCOSE, CAPILLARY - Abnormal; Notable for the  following:    Glucose-Capillary 326 (*)    All other components within normal limits  CULTURE, GROUP A STREP [facility])     ____________________________________________   EKG  None  ____________________________________________    RADIOLOGY  None  ____________________________________________   PROCEDURES  Procedure(s) performed: Incision and drainage, see procedure note(s).  Critical Care performed: No  INCISION AND DRAINAGE Performed by: Phineas Semen Consent: Verbal consent obtained. Risks and benefits: risks, benefits and alternatives were discussed Type: abscess  Body area: Mons  Anesthesia: local infiltration  Incision was made with a scalpel.  Local anesthetic: 1:4 Lidocaine 1%: Bupivacaine 0.5%  Anesthetic total: 1 ml  Complexity: complex Blunt dissection to break up loculations  Drainage: purulent  Drainage amount: moderate  Packing material: 1/4 in iodoform gauze  Patient tolerance: Patient tolerated the procedure well with no immediate complications.    ____________________________________________   INITIAL IMPRESSION / ASSESSMENT AND PLAN / ED COURSE  Pertinent labs & imaging results that were available during my care of the patient were reviewed by me and considered in my medical decision making (see chart for details).  Patient presented to the emergency department today with 2 main complaints. First was for a sore throat. On exam patient had very mild pharyngeal swelling however no signs of peritonsillar abscess. No exudates. I think likely viral nature. Her second complaint was for an abscess. This was incised and drained. Moderate amount of pus was evacuated. Given that the patient is diabetic will place on antibiotics. Will have patient follow-up with primary care.  ____________________________________________   FINAL CLINICAL IMPRESSION(S) / ED DIAGNOSES  Final diagnoses:  Pharyngitis  Abscess     Phineas Semen,  MD 01/29/15  0727 

## 2015-01-29 NOTE — ED Notes (Signed)
Patient reports having an abscess at her vaginal area.  Patient also reports sore throat for 2 days.  Patient is controlling own secretions, and no respiratory distress noted.

## 2015-01-29 NOTE — ED Notes (Signed)
Dr. Derrill Kay at bedside for procedure

## 2015-01-30 LAB — CULTURE, GROUP A STREP (THRC)

## 2015-01-31 ENCOUNTER — Encounter: Payer: Self-pay | Admitting: Unknown Physician Specialty

## 2015-01-31 ENCOUNTER — Encounter: Payer: BLUE CROSS/BLUE SHIELD | Attending: Neurology | Admitting: Dietician

## 2015-01-31 ENCOUNTER — Ambulatory Visit (INDEPENDENT_AMBULATORY_CARE_PROVIDER_SITE_OTHER): Payer: BLUE CROSS/BLUE SHIELD | Admitting: Unknown Physician Specialty

## 2015-01-31 VITALS — BP 112/78 | Wt 187.2 lb

## 2015-01-31 VITALS — BP 134/83 | HR 99 | Temp 98.5°F | Ht 65.3 in | Wt 189.0 lb

## 2015-01-31 DIAGNOSIS — E119 Type 2 diabetes mellitus without complications: Secondary | ICD-10-CM | POA: Diagnosis not present

## 2015-01-31 DIAGNOSIS — E1049 Type 1 diabetes mellitus with other diabetic neurological complication: Secondary | ICD-10-CM

## 2015-01-31 DIAGNOSIS — N760 Acute vaginitis: Secondary | ICD-10-CM

## 2015-01-31 MED ORDER — HYDROCODONE-ACETAMINOPHEN 5-325 MG PO TABS: 1.0000 | ORAL_TABLET | Freq: Four times a day (QID) | ORAL | Status: DC | PRN

## 2015-01-31 MED ORDER — DOXYCYCLINE HYCLATE 100 MG PO TABS: 100.0000 mg | ORAL_TABLET | Freq: Two times a day (BID) | ORAL | Status: DC

## 2015-01-31 MED ORDER — LIDOCAINE HCL 4 % EX SOLN: CUTANEOUS | Status: DC | PRN

## 2015-01-31 NOTE — Progress Notes (Addendum)
ED Culture Results   Allergies: See Chart   Visit Date:01/28/14  Chief Complaint: Pharyngitis, abscess  Culture Type: Throat   Culture Results: Strep Pyogenes   Original Abx given: Septra DS 1 tablet PO BID   Original Abx sensitive, intermediate, or resistant: typically resistant in nature  Recommended Abx: Amoxicillin 500 mg PO BID x 10 days.   ED Physician: Presley Raddle, MD  PharmD/ MD communication : MD Mayford Knife would like [place] to call patient to check and see if she is feeling better before starting Amoxicillin. If patient is feeling better then no need for change in therapy; however if she isn't call in Amoxicillin to pharmacy of choice.   Contacted Patient: Left VM and asked patient to give Pharmacy a call. Will re attempt to call tomorrow to follow up with patient.   Phone # [phone]  ADD:   Spoke with patient, patient states she feels better.    Demetrius Charity, PharmD, BCPS Clinical Pharmacist

## 2015-01-31 NOTE — Patient Instructions (Signed)
Discuss with endocrinologist about Toujeo and using ICR for meal doses  Resume regular exercise when able Carry medical alert ID at all times Carry glucose tablets or candy at all times

## 2015-01-31 NOTE — Progress Notes (Signed)
Diabetes Self-Management Education  Visit Type:  Comprehensive  Appt. Start Time: 0900 Appt. End Time: 1000  01/31/2015  Kristen Tran, identified by name and date of birth, is a 25 y.o. female with a diagnosis of Diabetes:  .   ASSESSMENT  Blood pressure 112/78, weight 187 lb 3.2 oz (84.913 kg), last menstrual period 01/29/2015. Body mass index is 31.15 kg/(m^2).       Diabetes Self-Management Education - 01/31/15 1018    Complications   How often do you check your blood sugar? --  4-5x/day-pt did not bring BG log   Fasting Blood glucose range (mg/dL) --  recent strep throat/vaginal abscess-ran out of insulin pen needles and did not take insulins for about 1 wk-BG's mostly 200's; now  taking insulins and on antibiotic for throat/abscess and past 2-3 days BG's much better=100's   Have you had a dilated eye exam in the past 12 months? Yes   Have you had a dental exam in the past 12 months? No  appt 02-2015   Are you checking your feet? Yes   How many days per week are you checking your feet? 4   Dietary Intake   Breakfast --  eats 3 meals/day and 1-2 snacks; pt reports she is counting carb grams and following meal plan, drinks only sugar free and diet drinks   Exercise   Exercise Type --  none recently due to sick=strep throat and vaginal abscess   Patient Education   Nutrition management  Role of diet in the treatment of diabetes and the relationship between the three main macronutrients and blood glucose level;Carbohydrate counting   Physical activity and exercise  --  encouraged to restart exercise when recovered from sickness-advised to carry candy  or glucose tablets at all times   Medications Taught/reviewed insulin injection, site rotation, insulin storage and needle disposal.;Reviewed patients medication for diabetes, action, purpose, timing of dose and side effects.  discussed use of ICR for meal doses and advised to discuss with MD(endocrinologist) at  tomorrow's appt along with possibly switching Lantus to Toujeo   Monitoring Yearly dilated eye exam;Daily foot exams   Acute complications Taught treatment of hypoglycemia - the 15 rule.;Discussed and identified patients' treatment of hyperglycemia.  reviewed causes of high and low BG's along with treatment   Chronic complications Relationship between chronic complications and blood glucose control;Assessed and discussed foot care and prevention of foot problems;Dental care;Retinopathy and reason for yearly dilated eye exams;Identified and discussed with patient  current chronic complications   Psychosocial adjustment Role of stress on diabetes   Preconception care Pregnancy and GDM  Role of pre-pregnancy blood glucose control on the development of the fetus;Role of family planning for patients with diabetes;Reviewed with patient blood glucose goals with pregnancy      Learning Objective:  Patient will have a greater understanding of diabetes self-management. Patient education plan is to attend individual and/or group sessions per assessed needs and concerns.   Plan:   Patient Instructions  Discuss with endocrinologist about Toujeo and using ICR for meal doses  Resume regular exercise when able Carry medical alert ID at all times Carry glucose tablets or candy at all times Take medications as directed by MD Obtain insulins and pen needles when opening last box of each   Expected Outcomes:     Education material provided: Handout on causes and treatment of high/low BG's, sickday care handout, foot care handout, medical alert ID card, pregnancy and diabetes If problems or questions, patient  to contact team via:  [phone]  Future DSME appointment: -  None

## 2015-01-31 NOTE — Progress Notes (Signed)
   BP 134/83 mmHg  Pulse 99  Temp(Src) 98.5 F (36.9 C)  Ht 5' 5.3" (1.659 m)  Wt 189 lb (85.73 kg)  BMI 31.15 kg/m2  SpO2 98%  LMP 01/29/2015   Subjective:    Patient ID: Kristen Tran, female    DOB: [birth year], 25 y.o.   MRN: [id number]  HPI: Kristen Tran is a 25 y.o. female  Chief Complaint  Patient presents with  . Hospitalization Follow-up    pt states she was at ER for an abcess and sore throat   Pt is here to f/u.  Tonsilitis seems to be better.  Pt still complaining of a great deal of pain from the abcess on her mons.  No fever.  She is taking Septra and Tramadol for pain.  Blood sugar is high since these infections.    Relevant past medical, surgical, family and social history reviewed and updated as indicated. Interim medical history since our last visit reviewed. Allergies and medications reviewed and updated.  Review of Systems  Per HPI unless specifically indicated above     Objective:    BP 134/83 mmHg  Pulse 99  Temp(Src) 98.5 F (36.9 C)  Ht 5' 5.3" (1.659 m)  Wt 189 lb (85.73 kg)  BMI 31.15 kg/m2  SpO2 98%  LMP 01/29/2015  Wt Readings from Last 3 Encounters:  01/31/15 189 lb (85.73 kg)  01/31/15 187 lb 3.2 oz (84.913 kg)  01/29/15 194 lb (87.998 kg)    Physical Exam  Constitutional: She is oriented to person, place, and time. She appears well-developed and well-nourished. No distress.  HENT:  Head: Normocephalic and atraumatic.  Eyes: Conjunctivae and lids are normal. Right eye exhibits no discharge. Left eye exhibits no discharge. No scleral icterus.  Cardiovascular: Normal rate.   Pulmonary/Chest: Effort normal.  Abdominal: Normal appearance. There is no splenomegaly or hepatomegaly.  Musculoskeletal: Normal range of motion.  Neurological: She is alert and oriented to person, place, and time.  Skin: Skin is intact. No rash noted. No pallor.  abcess on mons.  Packing fell out and unable to pack again  due to pain.  Induration surrounding abcess.  Unable to express more drainage.    Psychiatric: She has a normal mood and affect. Her behavior is normal. Judgment and thought content normal.      Assessment & Plan:   Problem List Items Addressed This Visit    None    Visit Diagnoses    Abscess of vagina    -  Primary    Change antibiotic to Doxycycline.  Lidocaine ointment and Hydrocodone for pain        Follow up plan: Return in about 1 week (around 02/07/2015).

## 2015-02-02 ENCOUNTER — Telehealth: Payer: Self-pay | Admitting: Unknown Physician Specialty

## 2015-02-02 NOTE — Telephone Encounter (Signed)
Routing to provider  

## 2015-02-02 NOTE — Telephone Encounter (Signed)
OK to write a note for today

## 2015-02-02 NOTE — Telephone Encounter (Signed)
Patient mom called stating that the medication that patient needs for pain wasn't going to come in until today and needs a doctor note for work, please call mother if any question [phone].

## 2015-02-02 NOTE — Telephone Encounter (Signed)
Called and let patient know that letter was written and ready to be picked up.

## 2015-02-07 ENCOUNTER — Ambulatory Visit (INDEPENDENT_AMBULATORY_CARE_PROVIDER_SITE_OTHER): Payer: BLUE CROSS/BLUE SHIELD | Admitting: Unknown Physician Specialty

## 2015-02-07 ENCOUNTER — Encounter: Payer: Self-pay | Admitting: Unknown Physician Specialty

## 2015-02-07 VITALS — BP 133/87 | HR 80 | Temp 98.8°F | Ht 65.3 in | Wt 191.4 lb

## 2015-02-07 DIAGNOSIS — L0292 Furuncle, unspecified: Secondary | ICD-10-CM

## 2015-02-07 MED ORDER — MUPIROCIN CALCIUM 2 % EX CREA: 1.0000 "application " | TOPICAL_CREAM | Freq: Two times a day (BID) | CUTANEOUS | Status: DC

## 2015-02-07 NOTE — Progress Notes (Signed)
   BP 133/87 mmHg  Pulse 80  Temp(Src) 98.8 F (37.1 C)  Ht 5' 5.3" (1.659 m)  Wt 191 lb 6.4 oz (86.818 kg)  BMI 31.54 kg/m2  SpO2 98%  LMP 01/29/2015   Subjective:    Patient ID: Kristen Tran, female    DOB: [birth year], 25 y.o.   MRN: [id number]  HPI: Kristen Tran is a 25 y.o. female  Chief Complaint  Patient presents with  . Cyst    pt states the abcess is not as sore as it was, just sore to the touch   Doing better with no drainage.    Relevant past medical, surgical, family and social history reviewed and updated as indicated. Interim medical history since our last visit reviewed. Allergies and medications reviewed and updated.  Review of Systems  Per HPI unless specifically indicated above     Objective:    BP 133/87 mmHg  Pulse 80  Temp(Src) 98.8 F (37.1 C)  Ht 5' 5.3" (1.659 m)  Wt 191 lb 6.4 oz (86.818 kg)  BMI 31.54 kg/m2  SpO2 98%  LMP 01/29/2015  Wt Readings from Last 3 Encounters:  02/07/15 191 lb 6.4 oz (86.818 kg)  01/31/15 189 lb (85.73 kg)  01/31/15 187 lb 3.2 oz (84.913 kg)    Physical Exam  Constitutional: She is oriented to person, place, and time. She appears well-developed and well-nourished. No distress.  HENT:  Head: Normocephalic and atraumatic.  Eyes: Conjunctivae and lids are normal. Right eye exhibits no discharge. Left eye exhibits no discharge. No scleral icterus.  Cardiovascular: Normal rate.   Pulmonary/Chest: Effort normal.  Abdominal: Normal appearance. There is no splenomegaly or hepatomegaly.  Musculoskeletal: Normal range of motion.  Neurological: She is alert and oriented to person, place, and time.  Skin: Skin is intact. No rash noted. No pallor.  Cyst without drainage and firm to touch but no drainage.    Psychiatric: She has a normal mood and affect. Her behavior is normal. Judgment and thought content normal.    Results for orders placed or performed during the hospital  encounter of 01/29/15  Culture, group A strep  Result Value Ref Range   Specimen Description THROAT    Special Requests NONE    Culture      GROUP A STREP (S.PYOGENES) ISOLATED There is no known Penicillin Resistant Beta Streptococcus in the [place] For patients that are Penicillin-allergic, Erythromycin is 85-94% susceptible, and Clindamycin is 80% susceptible.  Contact Microbiology within 7 days if sensitivity testing is  required.      Report Status 01/30/2015 FINAL   Glucose, capillary  Result Value Ref Range   Glucose-Capillary 326 (H) 65 - 99 mg/dL      Assessment & Plan:   Problem List Items Addressed This Visit    None    Visit Diagnoses    Boil    -  Primary    Improved.  continue present care    Relevant Medications    mupirocin cream (BACTROBAN) 2 %        Follow up plan: Return if symptoms worsen or fail to improve.

## 2015-02-14 ENCOUNTER — Encounter: Payer: Self-pay | Admitting: Unknown Physician Specialty

## 2015-02-14 ENCOUNTER — Ambulatory Visit (INDEPENDENT_AMBULATORY_CARE_PROVIDER_SITE_OTHER): Payer: BLUE CROSS/BLUE SHIELD | Admitting: Unknown Physician Specialty

## 2015-02-14 DIAGNOSIS — L0291 Cutaneous abscess, unspecified: Secondary | ICD-10-CM

## 2015-02-14 DIAGNOSIS — L0390 Cellulitis, unspecified: Secondary | ICD-10-CM

## 2015-02-14 MED ORDER — DOXYCYCLINE HYCLATE 100 MG PO TABS: 100.0000 mg | ORAL_TABLET | Freq: Two times a day (BID) | ORAL | Status: DC

## 2015-02-14 NOTE — Progress Notes (Signed)
   BP 149/103 mmHg  Pulse 84  Temp(Src) 98.9 F (37.2 C)  Ht  (1.651 m)  Wt 193 lb (87.544 kg)  BMI 32.12 kg/m2  SpO2 99%  LMP 01/29/2015   Subjective:    Patient ID: Kristen Tran, female    DOB: October 30, 1990, 25 y.o.   MRN: [id number]  HPI: Kristen Tran is a 25 y.o. female  Chief Complaint  Patient presents with  . Cyst    pt states she would like Kristen Tran to look at her abcess again, states it busted open again but is not as painful   Pt with abcess on Mons that has yet to completely heal.  She is done with her antibiotics and is now draining purulent fluid again.  States it is not yet as painful as before    Relevant past medical, surgical, family and social history reviewed and updated as indicated. Interim medical history since our last visit reviewed. Allergies and medications reviewed and updated.  Review of Systems  Per HPI unless specifically indicated above     Objective:    BP 149/103 mmHg  Pulse 84  Temp(Src) 98.9 F (37.2 C)  Ht  (1.651 m)  Wt 193 lb (87.544 kg)  BMI 32.12 kg/m2  SpO2 99%  LMP 01/29/2015  Wt Readings from Last 3 Encounters:  02/14/15 193 lb (87.544 kg)  02/07/15 191 lb 6.4 oz (86.818 kg)  01/31/15 189 lb (85.73 kg)    Physical Exam  Constitutional: She is oriented to person, place, and time. She appears well-developed and well-nourished. No distress.  HENT:  Head: Normocephalic and atraumatic.  Eyes: Conjunctivae and lids are normal. Right eye exhibits no discharge. Left eye exhibits no discharge. No scleral icterus.  Cardiovascular: Normal rate.   Pulmonary/Chest: Effort normal.  Abdominal: Normal appearance. There is no splenomegaly or hepatomegaly.  Musculoskeletal: Normal range of motion.  Neurological: She is alert and oriented to person, place, and time.  Skin: Skin is intact. No rash noted. No pallor.  abcess on Mons that is more indurated than previous.  Unable to drain further  due to pain.  Original ER visit was exquisitely painful  Psychiatric: She has a normal mood and affect. Her behavior is normal. Judgment and thought content normal.    Results for orders placed or performed during the hospital encounter of 01/29/15  Culture, group A strep  Result Value Ref Range   Specimen Description THROAT    Special Requests NONE    Culture      GROUP A STREP (S.PYOGENES) ISOLATED There is no known Penicillin Resistant Beta Streptococcus in the [place] For patients that are Penicillin-allergic, Erythromycin is 85-94% susceptible, and Clindamycin is 80% susceptible.  Contact Microbiology within 7 days if sensitivity testing is  required.      Report Status 01/30/2015 FINAL   Glucose, capillary  Result Value Ref Range   Glucose-Capillary 326 (H) 65 - 99 mg/dL      Assessment & Plan:   Problem List Items Addressed This Visit    None    Visit Diagnoses    Cellulitis and abscess        Will refer to surgery.  Restart Doxycycline    Relevant Orders    Ambulatory referral to General Surgery        Follow up plan: Return if symptoms worsen or fail to improve.

## 2015-02-16 ENCOUNTER — Encounter: Payer: Self-pay | Admitting: Surgery

## 2015-02-16 ENCOUNTER — Ambulatory Visit (INDEPENDENT_AMBULATORY_CARE_PROVIDER_SITE_OTHER): Payer: BLUE CROSS/BLUE SHIELD | Admitting: Surgery

## 2015-02-16 VITALS — BP 160/126 | HR 87 | Temp 98.3°F | Ht 65.0 in | Wt 198.0 lb

## 2015-02-16 DIAGNOSIS — N764 Abscess of vulva: Secondary | ICD-10-CM | POA: Diagnosis not present

## 2015-02-16 NOTE — Patient Instructions (Signed)
We will see you next week for [place] to excise it. Please continue taking your antibiotic including Bactrim. Call us if you have any questions.

## 2015-02-16 NOTE — Progress Notes (Signed)
Subjective:     Patient ID: Kristen Tran, female   DOB: May 23, 1990, 25 y.o.   MRN: [id number]  HPI  25yr old female  With multiple medical issues comes in today because of a left mons abscess that was drained in the emergency department a couple weeks ago. Patient states that she has had abscesses more in the vaginal area and in her right axilla previously. Both of these have had to be drained in the past. She denies ever having a diagnosis of hidradenitis.  Patient states that her blood sugars have been running slightly higher as well in the 200s range.  Patient states that initially the area was getting better she was on Bactrim and had packing in from the emergency department she states that she saw her PCP who removed the packing and change her to doxycycline at that time. Patient states that it seems to have been getting bigger and worse since that time. Asian states that it did start draining again but then stopped. Patient denies any fever chills nausea vomiting diarrhea dysuria.  Filed Vitals:   02/16/15 0900  BP: 160/126  Pulse: 87  Temp: 98.3 F (36.8 C)      Review of Systems  Constitutional: Negative for fever, chills, activity change, appetite change and fatigue.  HENT: Negative for congestion and sore throat.   Cardiovascular: Negative for chest pain, palpitations and leg swelling.  Gastrointestinal: Negative for nausea, vomiting, abdominal pain, diarrhea and abdominal distention.  Genitourinary: Negative for dysuria and hematuria.  Musculoskeletal: Negative for back pain.  Skin: Positive for color change and rash. Negative for pallor.  Neurological: Negative for dizziness and weakness.  Hematological: Negative for adenopathy. Does not bruise/bleed easily.  Psychiatric/Behavioral: Negative for agitation. The patient is not nervous/anxious.        Objective:   Physical Exam  Constitutional: She is oriented to person, place, and time. She appears  well-developed and well-nourished. No distress.  HENT:  Head: Normocephalic and atraumatic.  Right Ear: External ear normal.  Left Ear: External ear normal.  Nose: Nose normal.  Mouth/Throat: Oropharynx is clear and moist. No oropharyngeal exudate.  Eyes: Conjunctivae and EOM are normal. Pupils are equal, round, and reactive to light. No scleral icterus.  Neck: Normal range of motion. Neck supple. No tracheal deviation present.  Cardiovascular: Normal rate, regular rhythm, normal heart sounds and intact distal pulses.  Exam reveals no gallop and no friction rub.   No murmur heard. Pulmonary/Chest: Effort normal and breath sounds normal. No respiratory distress. She has no wheezes. She has no rales.  Abdominal: Soft. Bowel sounds are normal. She exhibits no distension. There is no tenderness.  Musculoskeletal: Normal range of motion. She exhibits no edema or tenderness.  Neurological: She is alert and oriented to person, place, and time.  Skin: Skin is warm. There is erythema.  Left mons abscess with small opening, induration and erytehma about 3cm surrounding the area, some purulent drainage  Psychiatric: She has a normal mood and affect. Her behavior is normal. Judgment and thought content normal.  Vitals reviewed.      Assessment:     25 yr old with left mons abscess     Plan:     The size of the area being about 3 cm in its chronicity I recommended that she have the area lanced and packed again. Patient has to go to work today and would like to schedule the [place] I recommended they be easier for her to have it  at this time but if she wants to wait and we will schedule her with my partner Dr. Everlene Farrier next Tuesday.   I instructed her to continue taking the doxycycline but to start taking the Bactrim as well to see if he can decrease and to continue warm compresses.

## 2015-02-22 ENCOUNTER — Encounter: Payer: Self-pay | Admitting: Surgery

## 2015-02-22 ENCOUNTER — Ambulatory Visit (INDEPENDENT_AMBULATORY_CARE_PROVIDER_SITE_OTHER): Payer: BLUE CROSS/BLUE SHIELD | Admitting: Surgery

## 2015-02-22 VITALS — BP 130/85 | HR 90 | Temp 98.3°F | Wt 190.0 lb

## 2015-02-22 DIAGNOSIS — L02219 Cutaneous abscess of trunk, unspecified: Secondary | ICD-10-CM | POA: Diagnosis not present

## 2015-02-22 NOTE — Patient Instructions (Addendum)
Please finish your antibiotics.   Please make sure you shave with a trimmer but not a razor on your pubic area.  Please use Benzoyl peroxide soap over-the-counter on the affected area when you shower.

## 2015-02-22 NOTE — Progress Notes (Signed)
Kristen Tran is a 25 year old female with a history of a suprapubic abscess and management with antibiotic. Patient comes for follow-up she denies any fevers or chills. There is still some mild pain in the suprapubic area no discharge. She is able to perform her daily activities without any issues  PE: No acute distress awake alert Abdomen: Soft nontender no masses no peritonitis Skin: His area of induration in the suprapubic area measuring 2 x 2 centimeters Mildly tender to palpation there is no evidence of erythema there is no evidence of fluctuance. No evidence of necrotizing infection  A/P resolving abscess of the suprapubic area responsive to medical therapy. I do not recommend any I and D's or procedures at this time. Patient is to finish antibiotics and see [place] back in a couple weeks. I recommend a Hibiclens baths daily.

## 2015-03-02 ENCOUNTER — Ambulatory Visit (INDEPENDENT_AMBULATORY_CARE_PROVIDER_SITE_OTHER): Payer: BLUE CROSS/BLUE SHIELD | Admitting: Surgery

## 2015-03-02 ENCOUNTER — Encounter: Payer: Self-pay | Admitting: Surgery

## 2015-03-02 VITALS — BP 152/102 | HR 78 | Temp 98.6°F | Wt 190.0 lb

## 2015-03-02 DIAGNOSIS — L02215 Cutaneous abscess of perineum: Secondary | ICD-10-CM | POA: Diagnosis not present

## 2015-03-02 NOTE — Progress Notes (Signed)
26 year old status post mons pubis abscess. Patient initially was seen with a large abscess and could not have it lanced that day was placed on antibiotics and followed up in my partner Dr. Everlene Farrier. patient states that it began draining much more purulent stuff after that and has since improved. Patient has completed her antibiotics patient also states that she had had what felt to be a vaginal yeast infection used over-the-counter medication and then she states that she developed blisters. Patient states that she has improved in this area is no longer having symptoms.  Filed Vitals:   03/02/15 1507  BP: 152/102  Pulse: 78  Temp: 98.6 F (37 C)   PE:  Gen: NAD Res: CTAB/L  Cardio: RRR Abd: soft, non-tender, nondistended GU: mons abscess with 1cm area of induration some small amount of purulent drainage no cellulitis or erythema Ext: 2+ pulses, no edema  A/P: 24 year old patient with healing mons pubis abscess. Patient has completed her course of antibiotics and only has minimal drainage from the area without any other signs of infection. Patient was instructed to continue keeping the area clean and keeping a dressing on it while it is draining. Patient can call with any questions concerns or if she feels that the area is worsening.

## 2015-03-02 NOTE — Patient Instructions (Addendum)
Please give us a call if you have any questions or concerns. 

## 2015-03-03 ENCOUNTER — Telehealth: Payer: Self-pay

## 2015-03-03 NOTE — Telephone Encounter (Signed)
Patient's mother called stating that her daughter Kristen Tran is vomiting and that her blood sugar continues to go up and down. Advised patient to go to the ED, per Gabriel Cirri, FNP.

## 2015-03-06 ENCOUNTER — Ambulatory Visit (INDEPENDENT_AMBULATORY_CARE_PROVIDER_SITE_OTHER): Payer: BLUE CROSS/BLUE SHIELD | Admitting: Unknown Physician Specialty

## 2015-03-06 ENCOUNTER — Encounter: Payer: Self-pay | Admitting: Unknown Physician Specialty

## 2015-03-06 VITALS — BP 138/98 | HR 92 | Temp 98.7°F | Ht 65.3 in | Wt 187.2 lb

## 2015-03-06 DIAGNOSIS — IMO0001 Reserved for inherently not codable concepts without codable children: Secondary | ICD-10-CM

## 2015-03-06 DIAGNOSIS — Z794 Long term (current) use of insulin: Secondary | ICD-10-CM

## 2015-03-06 DIAGNOSIS — N72 Inflammatory disease of cervix uteri: Secondary | ICD-10-CM | POA: Insufficient documentation

## 2015-03-06 DIAGNOSIS — E1165 Type 2 diabetes mellitus with hyperglycemia: Secondary | ICD-10-CM | POA: Diagnosis not present

## 2015-03-06 DIAGNOSIS — R112 Nausea with vomiting, unspecified: Secondary | ICD-10-CM | POA: Diagnosis not present

## 2015-03-06 DIAGNOSIS — R1110 Vomiting, unspecified: Secondary | ICD-10-CM | POA: Insufficient documentation

## 2015-03-06 LAB — CBC WITH DIFFERENTIAL/PLATELET
Hematocrit: 41.9 % (ref 34.0–46.6)
Hemoglobin: 15.2 g/dL (ref 11.1–15.9)
Lymphocytes Absolute: 1.3 10*3/uL (ref 0.7–3.1)
Lymphs: 24 %
MCH: 30.8 pg (ref 26.6–33.0)
MCHC: 36.3 g/dL — ABNORMAL HIGH (ref 31.5–35.7)
MCV: 85 fL (ref 79–97)
MID (Absolute): 0.1 10*3/uL (ref 0.1–1.6)
MID: 3 %
Neutrophils Absolute: 4.1 10*3/uL (ref 1.4–7.0)
Neutrophils: 74 %
Platelets: 332 10*3/uL (ref 150–379)
RBC: 4.93 x10E6/uL (ref 3.77–5.28)
RDW: 12.5 % (ref 12.3–15.4)
WBC: 5.5 10*3/uL (ref 3.4–10.8)

## 2015-03-06 NOTE — Progress Notes (Signed)
BP 138/98 mmHg  Pulse 92  Temp(Src) 98.7 F (37.1 C)  Ht 5' 5.3" (1.659 m)  Wt 187 lb 3.2 oz (84.913 kg)  BMI 30.85 kg/m2  SpO2 99%  LMP 02/25/2015 (Exact Date)   Subjective:    Patient ID: Kristen Tran, female    DOB: 1991/01/05, 25 y.o.   MRN: [id number]  HPI: Kristen Tran is a 25 y.o. female  Chief Complaint  Patient presents with  . Emesis    pt states she has been throwing up and does not know why. Patient states she was seen at [facility] ER and states they are testing her for STD's and herpes.   Alcus Dad    pt states she is having blisters and pain in vaginal area   Pt went to [facility] ER Friday and given 2 liters of fluid.  No reason was found for her vomiting.  She was told to follow up here and go back to the hospital if she continues to vomit.  She did have a negative pregnancy test.  They put her on Flagyl, Doxycycline, and Valcyclovire.  She was given Zofran for her vomiting.  STD tests are pending including herpes culture.  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     She does have an appointment in April with her Endocrinologist and is about 3 3 months lost to f/u.  Blood sugars are "all over the place."    Relevant past medical, surgical, family and social history reviewed and updated as indicated. Interim medical history since our last visit reviewed. Allergies and medications reviewed and updated.  Review of Systems  Per HPI unless specifically indicated above     Objective:    BP 138/98 mmHg  Pulse 92  Temp(Src) 98.7 F (37.1 C)  Ht 5' 5.3" (1.659 m)  Wt 187 lb 3.2 oz (84.913 kg)  BMI 30.85 kg/m2  SpO2 99%  LMP 02/25/2015 (Exact Date)  Wt Readings from Last 3 Encounters:  03/06/15 187 lb 3.2 oz (84.913 kg)  03/02/15 190 lb (86.183 kg)  02/22/15 190 lb (86.183 kg)    Physical Exam  Constitutional: She is oriented to person, place, and time. She appears well-developed and well-nourished. No distress.  HENT:   Head: Normocephalic and atraumatic.  Eyes: Conjunctivae and lids are normal. Right eye exhibits no discharge. Left eye exhibits no discharge. No scleral icterus.  Neck: Normal range of motion. Neck supple. No JVD present. Carotid bruit is not present.  Cardiovascular: Normal rate, regular rhythm and normal heart sounds.   Pulmonary/Chest: Effort normal and breath sounds normal.  Abdominal: Soft. Normal appearance and bowel sounds are normal. She exhibits no distension. There is no splenomegaly or hepatomegaly. There is no rebound and no guarding.  Musculoskeletal: Normal range of motion.  Neurological: She is alert and oriented to person, place, and time.  Skin: Skin is warm, dry and intact. No rash noted. No pallor.  Psychiatric: She has a normal mood and affect. Her behavior is normal. Judgment and thought content normal.   No genital evaluation is done at this time due to labs pending at [facility] and on maximum treatment for STDs CBC is normal     Assessment & Plan:   Problem List Items Addressed This Visit      Unprioritized   Vomiting - Primary    Suspect related to DM.  She has had similar episodes in the past and we have done a CT scan.  I've called her  Endocrine clinic and they will see her in 3 days.  In the meantime, take Zofran and if vomiting is intractable, go to the Er.        Relevant Orders   CBC With Differential/Platelet   Cervicitis    Other Visit Diagnoses    Uncontrolled type 2 diabetes mellitus without complication, with long-term current use of insulin (HCC)            Follow up plan: Return to the ER if no improvment.

## 2015-03-06 NOTE — Assessment & Plan Note (Signed)
Suspect related to DM.  She has had similar episodes in the past and we have done a CT scan.  I've called her Endocrine clinic and they will see her in 3 days.  In the meantime, take Zofran and if vomiting is intractable, go to the Er.

## 2015-03-08 NOTE — Telephone Encounter (Signed)
done

## 2015-03-13 ENCOUNTER — Telehealth: Payer: Self-pay

## 2015-03-13 MED ORDER — ONDANSETRON 4 MG PO TBDP: 4.0000 mg | ORAL_TABLET | Freq: Three times a day (TID) | ORAL | Status: DC | PRN

## 2015-03-13 MED ORDER — TRIAMCINOLONE ACETONIDE 0.1 % EX CREA: 1.0000 "application " | TOPICAL_CREAM | Freq: Two times a day (BID) | CUTANEOUS | Status: DC

## 2015-03-13 NOTE — Telephone Encounter (Signed)
Please write a note for the days she needs to be out

## 2015-03-13 NOTE — Telephone Encounter (Signed)
Called and spoke to patient. She states that she did go to the ER and they told her that she would continue vomiting until she was done with the antibiotics. She also states that she did go see the endocrinologist.

## 2015-03-13 NOTE — Telephone Encounter (Signed)
She was told to return to the ER for continued vomiting.  Also, she was to see her endocrinologist and I need to make sure that visit happened.

## 2015-03-13 NOTE — Telephone Encounter (Signed)
Patient's mother called in and stated that the patient wanted her to call and ask us if we can refill her zofran and kenalog cream. Pharmacy is [facility] [address]. Patient's mother also stated that the patient asked if we could write her another letter work because she is still throwing up.

## 2015-03-13 NOTE — Telephone Encounter (Signed)
Called and spoke to patient. She stated she was going to try to go back to work Thursday because she would be done with the antibiotics by then. I let the patient know I was writing her a letter and that she could pick it up at the front desk.

## 2015-03-14 ENCOUNTER — Telehealth: Payer: Self-pay | Admitting: Unknown Physician Specialty

## 2015-03-14 NOTE — Telephone Encounter (Signed)
Pt came in stated her employer will be faxing paperwork requesting further information about why the patient is missing work. Patient stated she is willing to come in for paperwork. Thanks.

## 2015-03-15 NOTE — Telephone Encounter (Signed)
Tried to call patient because we have not gotten any paperwork from her employer to fill out. The phone kept ringing, then stopped, and then hung up. No voicemail came up so I was not able to leave one. Will try to call again later.

## 2015-03-15 NOTE — Telephone Encounter (Signed)
Patient returned call. I informed her that we had not gotten any paperwork from her employer yet. She stated she would call them and see what was going on. I confirmed our fax number with the patient.

## 2015-03-17 ENCOUNTER — Telehealth: Payer: Self-pay | Admitting: Unknown Physician Specialty

## 2015-03-17 NOTE — Telephone Encounter (Signed)
Pt called and stated that her job has the paperwork and will be sending it next week instead of today as planned. (FYI)

## 2015-03-17 NOTE — Telephone Encounter (Signed)
Called and let patient know that I still have not received any paperwork from her employer. I let her know that we really need it today so Elnita MaxwellCheryl can sign it because Elnita MaxwellCheryl will be gone all of next week. Patient stated she called her employer yesterday and they told her they faxed it so she said she would call them and let them know that we still have not gotten the paperwork.

## 2015-03-27 ENCOUNTER — Telehealth: Payer: Self-pay | Admitting: Unknown Physician Specialty

## 2015-03-27 MED ORDER — VALACYCLOVIR HCL 500 MG PO TABS: 500.0000 mg | ORAL_TABLET | Freq: Two times a day (BID) | ORAL | Status: DC

## 2015-03-27 NOTE — Telephone Encounter (Signed)
Pt would like to have valtrex sent to [address]

## 2015-03-27 NOTE — Telephone Encounter (Signed)
Routing to provider  

## 2015-04-12 LAB — HEMOGLOBIN A1C: Hemoglobin A1C: 10.6

## 2015-05-24 ENCOUNTER — Other Ambulatory Visit: Payer: Self-pay | Admitting: Unknown Physician Specialty

## 2015-05-24 MED ORDER — METOPROLOL SUCCINATE ER 100 MG PO TB24: 100.0000 mg | ORAL_TABLET | Freq: Every day | ORAL | Status: DC

## 2015-05-24 MED ORDER — VALACYCLOVIR HCL 500 MG PO TABS: 500.0000 mg | ORAL_TABLET | Freq: Two times a day (BID) | ORAL | Status: DC

## 2015-05-24 NOTE — Telephone Encounter (Signed)
Called and spoke to patient. She is questioning valtrex rx. She stated in the past that she has gotten the rx to take for 10 days in a row but the last one she got, it only for 5 days. Which is it supposed to be?

## 2015-05-24 NOTE — Telephone Encounter (Signed)
Called and let patient know everything Kristen Tran said.

## 2015-05-24 NOTE — Telephone Encounter (Signed)
5 days is usually sufficient.  But 10 days is fine if that works better.  I will change it to that.  I don't prescribe her Lantus.

## 2015-05-24 NOTE — Telephone Encounter (Signed)
Insulin Glargine (LANTUS SOLOSTAR) 100 UNIT/ML Solostar Pen metoprolol succinate (TOPROL-XL) 100 MG 24 hr tablet valACYclovir (VALTREX) 500 MG tablet [facility]acy: [facility] [address] [place] - [address]  Patient needs refill on her medications but also has question for the CMA, if she can return her call [phone], thanks.

## 2015-05-26 ENCOUNTER — Ambulatory Visit (INDEPENDENT_AMBULATORY_CARE_PROVIDER_SITE_OTHER): Payer: BLUE CROSS/BLUE SHIELD | Admitting: Unknown Physician Specialty

## 2015-05-26 ENCOUNTER — Encounter: Payer: Self-pay | Admitting: Unknown Physician Specialty

## 2015-05-26 VITALS — BP 112/76 | HR 79 | Temp 98.8°F | Ht 65.3 in | Wt 190.4 lb

## 2015-05-26 DIAGNOSIS — L02411 Cutaneous abscess of right axilla: Secondary | ICD-10-CM

## 2015-05-26 DIAGNOSIS — G4700 Insomnia, unspecified: Secondary | ICD-10-CM | POA: Diagnosis not present

## 2015-05-26 MED ORDER — CYCLOBENZAPRINE HCL 10 MG PO TABS: 10.0000 mg | ORAL_TABLET | Freq: Every day | ORAL | Status: DC

## 2015-05-26 MED ORDER — HYDROCODONE-ACETAMINOPHEN 5-325 MG PO TABS: 1.0000 | ORAL_TABLET | Freq: Four times a day (QID) | ORAL | Status: DC | PRN

## 2015-05-26 NOTE — Assessment & Plan Note (Signed)
Vicoden for pain

## 2015-05-26 NOTE — Assessment & Plan Note (Signed)
Rx for Flexeril QHS 

## 2015-05-26 NOTE — Progress Notes (Signed)
BP 112/76 mmHg  Pulse 79  Temp(Src) 98.8 F (37.1 C)  Ht 5' 5.3" (1.659 m)  Wt 190 lb 6.4 oz (86.365 kg)  BMI 31.38 kg/m2  SpO2 98%  LMP 05/19/2015 (Exact Date)   Subjective:    Patient ID: Kristen Tran, female    DOB: 12-Jun-1990, 25 y.o.   MRN: [id number]  HPI: Kristen Tran is a 25 y.o. female  Chief Complaint  Patient presents with  . other    pt has abcess under right arm that [facility] but was not packed  . Medication Refill    pt states she needs refill on cyclobenzaprine   Pt with abscess under right arm which is a recurrent problem for this patient.  She is taking Bactrim.  She continues to be in a great deal of pain.  No fever.    Pt needs refill of cyclobenzaprine she takes QHS prn.     Relevant past medical, surgical, family and social history reviewed and updated as indicated. Interim medical history since our last visit reviewed. Allergies and medications reviewed and updated.  Review of Systems  Per HPI unless specifically indicated above     Objective:    BP 112/76 mmHg  Pulse 79  Temp(Src) 98.8 F (37.1 C)  Ht 5' 5.3" (1.659 m)  Wt 190 lb 6.4 oz (86.365 kg)  BMI 31.38 kg/m2  SpO2 98%  LMP 05/19/2015 (Exact Date)  Wt Readings from Last 3 Encounters:  05/26/15 190 lb 6.4 oz (86.365 kg)  03/06/15 187 lb 3.2 oz (84.913 kg)  03/02/15 190 lb (86.183 kg)    Physical Exam  Constitutional: She is oriented to person, place, and time. She appears well-developed and well-nourished. No distress.  HENT:  Head: Normocephalic and atraumatic.  Eyes: Conjunctivae and lids are normal. Right eye exhibits no discharge. Left eye exhibits no discharge. No scleral icterus.  Cardiovascular: Normal rate.   Pulmonary/Chest: Effort normal.  Abdominal: Normal appearance. There is no splenomegaly or hepatomegaly.  Musculoskeletal: Normal range of motion.  Neurological: She is alert and oriented to person, place,  and time.  Skin: Skin is intact. No rash noted. No pallor.  abcess site under right arm without purulent drainage or induration.    Psychiatric: She has a normal mood and affect. Her behavior is normal. Judgment and thought content normal.    Results for orders placed or performed in visit on 03/06/15  CBC With Differential/Platelet  Result Value Ref Range   WBC 5.5 3.4 - 10.8 x10E3/uL   RBC 4.93 3.77 - 5.28 x10E6/uL   Hemoglobin 15.2 11.1 - 15.9 g/dL   Hematocrit 40.941.9 81.134.0 - 46.6 %   MCV 85 79 - 97 fL   MCH 30.8 26.6 - 33.0 pg   MCHC 36.3 (H) 31.5 - 35.7 g/dL   RDW 91.412.5 78.212.3 - 95.615.4 %   Platelets 332 150 - 379 x10E3/uL   Neutrophils 74 %   Lymphs 24 %   MID 3 %   Neutrophils Absolute 4.1 1.4 - 7.0 x10E3/uL   Lymphocytes Absolute 1.3 0.7 - 3.1 x10E3/uL   MID (Absolute) 0.1 0.1 - 1.6 X10E3/uL      Assessment & Plan:   Problem List Items Addressed This Visit      Unprioritized   Abscess of axilla, right - Primary    Vicoden for pain      Insomnia    Rx for Flexeril QHS         #  15 Vicodin given for prn use  Follow up plan: Return if symptoms worsen or fail to improve.

## 2015-06-02 ENCOUNTER — Telehealth: Payer: Self-pay

## 2015-06-02 NOTE — Telephone Encounter (Signed)
Patient's mother called in and stated that the abcess patient has under her arm is very painful and sore to the touch. Patient's mother would like a call back.

## 2015-06-02 NOTE — Telephone Encounter (Signed)
Unfortunately, at this point, she will need to go to the ER for further evaluation

## 2015-06-02 NOTE — Telephone Encounter (Signed)
Patient notified about what Cheryl said.  

## 2015-06-07 ENCOUNTER — Encounter: Payer: Self-pay | Admitting: Unknown Physician Specialty

## 2015-06-07 ENCOUNTER — Ambulatory Visit (INDEPENDENT_AMBULATORY_CARE_PROVIDER_SITE_OTHER): Payer: BLUE CROSS/BLUE SHIELD | Admitting: Unknown Physician Specialty

## 2015-06-07 VITALS — BP 144/87 | HR 93 | Temp 98.8°F | Ht 65.3 in | Wt 196.0 lb

## 2015-06-07 DIAGNOSIS — L732 Hidradenitis suppurativa: Secondary | ICD-10-CM | POA: Diagnosis not present

## 2015-06-07 DIAGNOSIS — L02411 Cutaneous abscess of right axilla: Secondary | ICD-10-CM

## 2015-06-07 MED ORDER — HYDROCODONE-ACETAMINOPHEN 5-325 MG PO TABS: 1.0000 | ORAL_TABLET | Freq: Four times a day (QID) | ORAL | Status: DC | PRN

## 2015-06-07 NOTE — Assessment & Plan Note (Signed)
This is the third abcess this year for this young woman and has had multiple ER visit.  Refer to plastic surgery to see if a definitive solution can be found.

## 2015-06-07 NOTE — Progress Notes (Signed)
BP 144/87 mmHg  Pulse 93  Temp(Src) 98.8 F (37.1 C)  Ht 5' 5.3" (1.659 m)  Wt 196 lb (88.905 kg)  BMI 32.30 kg/m2  SpO2 100%  LMP 05/19/2015 (Exact Date)   Subjective:    Patient ID: Kristen Tran, female    DOB: [birth year], 25 y.o.   MRN: [id number]  HPI: Kristen Tran is a 25 y.o. female  Chief Complaint  Patient presents with  . follow up abscess   Pt with continued problems under right arm.  She went to ER an [facility] about 2 weeks ago.  She returned again on Friday in which another cyst had grown.  She is now taking Doxycycline.  She was told at [facility] surgical that she has a condition causing multiple abcesses.  Called hydradenitis and would need to see a Engineer, petroleum.    She states her blood sugars were doing well until the last week.    Relevant past medical, surgical, family and social history reviewed and updated as indicated. Interim medical history since our last visit reviewed. Allergies and medications reviewed and updated.  Review of Systems  Per HPI unless specifically indicated above     Objective:    BP 144/87 mmHg  Pulse 93  Temp(Src) 98.8 F (37.1 C)  Ht 5' 5.3" (1.659 m)  Wt 196 lb (88.905 kg)  BMI 32.30 kg/m2  SpO2 100%  LMP 05/19/2015 (Exact Date)  Wt Readings from Last 3 Encounters:  06/07/15 196 lb (88.905 kg)  05/26/15 190 lb 6.4 oz (86.365 kg)  03/06/15 187 lb 3.2 oz (84.913 kg)    Physical Exam  Constitutional: She is oriented to person, place, and time. She appears well-developed and well-nourished. No distress.  HENT:  Head: Normocephalic and atraumatic.  Eyes: Conjunctivae and lids are normal. Right eye exhibits no discharge. Left eye exhibits no discharge. No scleral icterus.  Cardiovascular: Normal rate.   Pulmonary/Chest: Effort normal.  Abdominal: Normal appearance. There is no splenomegaly or hepatomegaly.  Musculoskeletal: Normal range of motion.  Neurological: She is alert and oriented  to person, place, and time.  Skin: Skin is intact. No rash noted. No pallor.  Psychiatric: She has a normal mood and affect. Her behavior is normal. Judgment and thought content normal.   Abcess under right arm is draining a lot of purulent drainage.   Results for orders placed or performed in visit on 03/06/15  CBC With Differential/Platelet  Result Value Ref Range   WBC 5.5 3.4 - 10.8 x10E3/uL   RBC 4.93 3.77 - 5.28 x10E6/uL   Hemoglobin 15.2 11.1 - 15.9 g/dL   Hematocrit 40.9 81.1 - 46.6 %   MCV 85 79 - 97 fL   MCH 30.8 26.6 - 33.0 pg   MCHC 36.3 (H) 31.5 - 35.7 g/dL   RDW 91.4 78.2 - 95.6 %   Platelets 332 150 - 379 x10E3/uL   Neutrophils 74 %   Lymphs 24 %   MID 3 %   Neutrophils Absolute 4.1 1.4 - 7.0 x10E3/uL   Lymphocytes Absolute 1.3 0.7 - 3.1 x10E3/uL   MID (Absolute) 0.1 0.1 - 1.6 X10E3/uL      Assessment & Plan:   Problem List Items Addressed This Visit      Unprioritized   Abscess of axilla, right    Currently draining and redressed without packing.  Asked to use heating pad.  Rx for Vicoden given for pain      Hydradenitis - Primary  This is the third abcess this year for this young woman and has had multiple ER visit.  Refer to plastic surgery to see if a definitive solution can be found.        Relevant Orders   Ambulatory referral to Plastic Surgery       Follow up plan: Return if symptoms worsen or fail to improve.

## 2015-06-07 NOTE — Assessment & Plan Note (Addendum)
Currently draining and redressed without packing.  Asked to use heating pad.  Rx for Vicoden given for pain

## 2015-06-13 ENCOUNTER — Encounter: Payer: Self-pay | Admitting: Unknown Physician Specialty

## 2015-06-13 ENCOUNTER — Ambulatory Visit (INDEPENDENT_AMBULATORY_CARE_PROVIDER_SITE_OTHER): Payer: BLUE CROSS/BLUE SHIELD | Admitting: Unknown Physician Specialty

## 2015-06-13 VITALS — BP 135/88 | HR 99 | Temp 98.9°F | Ht 65.3 in | Wt 196.4 lb

## 2015-06-13 DIAGNOSIS — L0291 Cutaneous abscess, unspecified: Secondary | ICD-10-CM

## 2015-06-13 DIAGNOSIS — L02411 Cutaneous abscess of right axilla: Secondary | ICD-10-CM

## 2015-06-13 NOTE — Progress Notes (Signed)
BP 135/88 mmHg  Pulse 99  Temp(Src) 98.9 F (37.2 C)  Ht 5' 5.3" (1.659 m)  Wt 196 lb 6.4 oz (89.086 kg)  BMI 32.37 kg/m2  SpO2 97%  LMP 05/19/2015 (Exact Date)   Subjective:    Patient ID: Kristen Tran, female    DOB: [birth year], 25 y.o.   MRN: [id number]  HPI: Kristen Tran is a 25 y.o. female  Chief Complaint  Patient presents with  . Mass    pt states she thinks she has another spot forming under her right arm. States the most recent abcess busted again last night and is still drainging   Abscess right axilla "closed up" but getting bigger and more painful.  Last night started draining again with copious amounts of purulent drainage.  She remains of Doxycycline but last dose is tomorrow.    Relevant past medical, surgical, family and social history reviewed and updated as indicated. Interim medical history since our last visit reviewed. Allergies and medications reviewed and updated.  Review of Systems  Per HPI unless specifically indicated above     Objective:    BP 135/88 mmHg  Pulse 99  Temp(Src) 98.9 F (37.2 C)  Ht 5' 5.3" (1.659 m)  Wt 196 lb 6.4 oz (89.086 kg)  BMI 32.37 kg/m2  SpO2 97%  LMP 05/19/2015 (Exact Date)  Wt Readings from Last 3 Encounters:  06/13/15 196 lb 6.4 oz (89.086 kg)  06/07/15 196 lb (88.905 kg)  05/26/15 190 lb 6.4 oz (86.365 kg)    Physical Exam  Constitutional: She is oriented to person, place, and time. She appears well-developed and well-nourished. No distress.  HENT:  Head: Normocephalic and atraumatic.  Eyes: Conjunctivae and lids are normal. Right eye exhibits no discharge. Left eye exhibits no discharge. No scleral icterus.  Cardiovascular: Normal rate.   Pulmonary/Chest: Effort normal.  Abdominal: Normal appearance. There is no splenomegaly or hepatomegaly.  Musculoskeletal: Normal range of motion.  Neurological: She is alert and oriented to person, place, and time.  Skin: Skin  is intact.  Abscess right axilla draining copious amounts of purulent discharge  Psychiatric: She has a normal mood and affect. Her behavior is normal. Judgment and thought content normal.    Results for orders placed or performed in visit on 03/06/15  CBC With Differential/Platelet  Result Value Ref Range   WBC 5.5 3.4 - 10.8 x10E3/uL   RBC 4.93 3.77 - 5.28 x10E6/uL   Hemoglobin 15.2 11.1 - 15.9 g/dL   Hematocrit 95.241.9 84.134.0 - 46.6 %   MCV 85 79 - 97 fL   MCH 30.8 26.6 - 33.0 pg   MCHC 36.3 (H) 31.5 - 35.7 g/dL   RDW 32.412.5 40.112.3 - 02.715.4 %   Platelets 332 150 - 379 x10E3/uL   Neutrophils 74 %   Lymphs 24 %   MID 3 %   Neutrophils Absolute 4.1 1.4 - 7.0 x10E3/uL   Lymphocytes Absolute 1.3 0.7 - 3.1 x10E3/uL   MID (Absolute) 0.1 0.1 - 1.6 X10E3/uL      Assessment & Plan:   Problem List Items Addressed This Visit      Unprioritized   Abscess of axilla, right    This is recurrent and last abscess has not healed after I&D and 2 weeks of antibiotics.  Will refer to surgery for further management.  I also put in a referral last week for plastic surgery to evaluated for a curative solution.  Other Visit Diagnoses    Abscess    -  Primary    Relevant Orders    Ambulatory referral to General Surgery        Follow up plan: Return for urgent referral to surgery.

## 2015-06-13 NOTE — Assessment & Plan Note (Signed)
This is recurrent and last abscess has not healed after I&D and 2 weeks of antibiotics.  Will refer to surgery for further management.  I also put in a referral last week for plastic surgery to evaluated for a curative solution.

## 2015-06-16 ENCOUNTER — Ambulatory Visit (INDEPENDENT_AMBULATORY_CARE_PROVIDER_SITE_OTHER): Payer: BLUE CROSS/BLUE SHIELD | Admitting: Surgery

## 2015-06-16 ENCOUNTER — Encounter: Payer: Self-pay | Admitting: Surgery

## 2015-06-16 VITALS — BP 163/128 | HR 98 | Temp 98.3°F | Ht 65.0 in | Wt 194.2 lb

## 2015-06-16 DIAGNOSIS — L02411 Cutaneous abscess of right axilla: Secondary | ICD-10-CM

## 2015-06-16 NOTE — Progress Notes (Signed)
Outpatient Surgical Follow Up  06/16/2015  Kristen Tran is an 25 y.o. female.   CC: Right axillary abscess  HPI: Is patient with a right axillary abscess that spontaneously drained she's had no fevers or chills but she's had multiple episodes of perineal abscesses and now right axillary abscess no left  axillary problems. She has just been changed to Augmentin today based on some culture results called in by her primary care physician.  Past Medical History  Diagnosis Date  . Asthma   . GERD (gastroesophageal reflux disease)   . Anemia   . Diabetes mellitus without complication (HCC)   . Atopic dermatitis   . Bronchopneumonia   . Hypertension   . Obesity   . Anxiety   . Depression   . Gastroparesis     Past Surgical History  Procedure Laterality Date  . Cholecystectomy    . Fracture surgery    . Wisdom tooth extraction      Family History  Problem Relation Age of Onset  . Diabetes Mother   . Diabetes Father   . Hypertension Father   . Heart disease Father   . Diabetes Sister   . Diabetes Brother   . Diabetes Maternal Grandmother   . Hypertension Maternal Grandmother   . Diabetes Maternal Grandfather   . Hypertension Maternal Grandfather   . Diabetes Paternal Grandmother   . Hypertension Paternal Grandmother   . Diabetes Paternal Grandfather   . Hypertension Paternal Grandfather     Social History:  reports that she has never smoked. She has never used smokeless tobacco. She reports that she drinks alcohol. She reports that she does not use illicit drugs.  Allergies:  Allergies  Allergen Reactions  . Bee Venom   . Contrast Media [Iodinated Diagnostic Agents] Nausea And Vomiting  . Ibuprofen Diarrhea and Nausea And Vomiting  . Morphine Sulfate Itching  . Tomato Swelling  . Ioxaglate Nausea And Vomiting    Medications reviewed.   Review of Systems:   Review of Systems  Constitutional: Negative for fever and chills.  HENT: Negative.    Eyes: Negative.   Respiratory: Negative.   Cardiovascular: Negative.   Gastrointestinal: Negative.   Genitourinary: Negative.   Musculoskeletal: Negative.   Skin: Negative.   Neurological: Negative.   Endo/Heme/Allergies: Negative.   Psychiatric/Behavioral: Negative.      Physical Exam:  LMP 05/19/2015 (Exact Date)  Physical Exam  Constitutional: She is oriented to person, place, and time and well-developed, well-nourished, and in no distress. No distress.  HENT:  Head: Normocephalic.  Mouth/Throat: No oropharyngeal exudate.  Eyes: Right eye exhibits no discharge. Left eye exhibits no discharge. No scleral icterus.  Neck: Normal range of motion.  Pulmonary/Chest: Effort normal. No respiratory distress.  Abdominal: Soft. There is no tenderness.  Musculoskeletal: Normal range of motion. She exhibits no edema.  Lymphadenopathy:    She has no cervical adenopathy.  Neurological: She is alert and oriented to person, place, and time.  Skin: Skin is warm. She is diaphoretic. No erythema.  Right axillary abscess that is spontaneously draining serous fluid without frank pus and no odor. No erythema is present. The areas nontender.  Vitals reviewed.     No results found for this or any previous visit (from the past 48 hour(s)). No results found.  Assessment/Plan:  Spontaneously drained abscess in the right axilla currently being changed antibiotics to Augmentin I her primary care physician based on culture results. This area appears chronically inflamed and would likely benefit  from excisional biopsy is limited to the axilla and a small ridge and could be performed with minimal risk to open wound and cosmesis.  When I began speaking about excision the patient notified me that she saw a plastic [place] in [facility] 2 days ago who recommended excision. Without a mind I would refer her back to her plastic surgeon there is no acute surgical needs at this point. Follow-up as  needed  Lattie Hawichard E Jodell Weitman, MD, FACS

## 2015-06-16 NOTE — Patient Instructions (Signed)
Please call our office if you have any questions or concerns.  

## 2015-07-06 ENCOUNTER — Encounter: Payer: Self-pay | Admitting: Family Medicine

## 2015-07-06 ENCOUNTER — Ambulatory Visit (INDEPENDENT_AMBULATORY_CARE_PROVIDER_SITE_OTHER): Payer: BLUE CROSS/BLUE SHIELD | Admitting: Family Medicine

## 2015-07-06 VITALS — BP 133/90 | HR 96 | Temp 98.8°F | Wt 191.0 lb

## 2015-07-06 DIAGNOSIS — T148 Other injury of unspecified body region: Secondary | ICD-10-CM | POA: Diagnosis not present

## 2015-07-06 DIAGNOSIS — W57XXXA Bitten or stung by nonvenomous insect and other nonvenomous arthropods, initial encounter: Secondary | ICD-10-CM

## 2015-07-06 MED ORDER — CLOBETASOL PROPIONATE 0.05 % EX CREA: 1.0000 "application " | TOPICAL_CREAM | Freq: Two times a day (BID) | CUTANEOUS | Status: DC

## 2015-07-06 MED ORDER — HYDROXYZINE HCL 10 MG PO TABS: 10.0000 mg | ORAL_TABLET | Freq: Three times a day (TID) | ORAL | Status: DC | PRN

## 2015-07-06 NOTE — Progress Notes (Signed)
BP 133/90 mmHg  Pulse 96  Temp(Src) 98.8 F (37.1 C)  Wt 191 lb (86.637 kg)  SpO2 99%  LMP 06/17/2015 (Exact Date)   Subjective:    Patient ID: Kristen Tran, female    DOB: Jun 25, 1990, 25 y.o.   MRN: [id number]  HPI: Kristen Tran is a 25 y.o. female  Chief Complaint  Patient presents with  . Rash    Itchy bumps on arms, legs, lower back/side started 2 days ago. If she scratches them hard, they burn. Has been using Benadryl and Triamcinalone cream. No detergant change. Only changed from name brand Dove soap to [employer] brand.   Patient presents with a 2 day history of itchy rash that is sporadic spread across multiple areas of her body. More areas have appeared since originally noticed. Patient lives with mother and sibling, neither of them have any bumps. Denies any new medications, foods, or products (other than starting generic dove soap). Admits to noticing several fleas on her house pets the past week, gave them all flea baths and thought they had been eradicated. The animals all sleep with her and aren't allowed in the other bedrooms. Has been putting triamcinolone on bumps and taking benadryl with no relief.   Relevant past medical, surgical, family and social history reviewed and updated as indicated. Interim medical history since our last visit reviewed. Allergies and medications reviewed and updated.  Review of Systems  Constitutional: Negative.   Respiratory: Negative.   Cardiovascular: Negative.   Gastrointestinal: Negative.   Musculoskeletal: Negative.   Skin: Positive for rash.  Neurological: Negative.   Psychiatric/Behavioral: Negative.     Per HPI unless specifically indicated above     Objective:    BP 133/90 mmHg  Pulse 96  Temp(Src) 98.8 F (37.1 C)  Wt 191 lb (86.637 kg)  SpO2 99%  LMP 06/17/2015 (Exact Date)  Wt Readings from Last 3 Encounters:  07/06/15 191 lb (86.637 kg)  06/16/15 194 lb 3.2 oz (88.089 kg)   06/13/15 196 lb 6.4 oz (89.086 kg)    Physical Exam  Constitutional: She is oriented to person, place, and time. She appears well-developed and well-nourished. No distress.  HENT:  Head: Atraumatic.  Eyes: Conjunctivae are normal. No scleral icterus.  Neck: Normal range of motion. Neck supple.  Cardiovascular: Normal rate, regular rhythm and normal heart sounds.   Pulmonary/Chest: Effort normal and breath sounds normal. No respiratory distress.  Musculoskeletal: Normal range of motion.  Neurological: She is alert and oriented to person, place, and time.  Skin: Skin is warm and dry. Rash: grouped red papules spread across multiple body parts. She is not diaphoretic.  Psychiatric: She has a normal mood and affect. Her behavior is normal.    Results for orders placed or performed in visit on 03/06/15  CBC With Differential/Platelet  Result Value Ref Range   WBC 5.5 3.4 - 10.8 x10E3/uL   RBC 4.93 3.77 - 5.28 x10E6/uL   Hemoglobin 15.2 11.1 - 15.9 g/dL   Hematocrit 24.441.9 01.034.0 - 46.6 %   MCV 85 79 - 97 fL   MCH 30.8 26.6 - 33.0 pg   MCHC 36.3 (H) 31.5 - 35.7 g/dL   RDW 27.212.5 53.612.3 - 64.415.4 %   Platelets 332 150 - 379 x10E3/uL   Neutrophils 74 %   Lymphs 24 %   MID 3 %   Neutrophils Absolute 4.1 1.4 - 7.0 x10E3/uL   Lymphocytes Absolute 1.3 0.7 - 3.1 x10E3/uL   MID (Absolute)  0.1 0.1 - 1.6 X10E3/uL      Assessment & Plan:   Problem List Items Addressed This Visit    None    Visit Diagnoses    Insect bites    -  Primary    Likely flea bites, given clobetasol for temporary spot treatment and hydroxyzine.       Advised patient to further treat house pets and to wash all household items in hot water and dry on high heat to help with eradication.   Follow up plan: Return if symptoms worsen or fail to improve.

## 2015-07-06 NOTE — Patient Instructions (Signed)
Wash all household items in hot water and dry on high heat. Continue treating house pets with flea medication.

## 2015-07-10 ENCOUNTER — Other Ambulatory Visit: Payer: Self-pay | Admitting: Unknown Physician Specialty

## 2015-07-27 ENCOUNTER — Emergency Department
Admission: EM | Admit: 2015-07-27 | Discharge: 2015-07-27 | Disposition: A | Discharge status: Home / Self Care | Payer: BLUE CROSS/BLUE SHIELD | Attending: Emergency Medicine | Admitting: Emergency Medicine

## 2015-07-27 ENCOUNTER — Encounter: Payer: Self-pay | Admitting: Emergency Medicine

## 2015-07-27 DIAGNOSIS — Z794 Long term (current) use of insulin: Secondary | ICD-10-CM | POA: Insufficient documentation

## 2015-07-27 DIAGNOSIS — I10 Essential (primary) hypertension: Secondary | ICD-10-CM | POA: Diagnosis not present

## 2015-07-27 DIAGNOSIS — J45909 Unspecified asthma, uncomplicated: Secondary | ICD-10-CM | POA: Insufficient documentation

## 2015-07-27 DIAGNOSIS — F329 Major depressive disorder, single episode, unspecified: Secondary | ICD-10-CM | POA: Diagnosis not present

## 2015-07-27 DIAGNOSIS — R531 Weakness: Secondary | ICD-10-CM

## 2015-07-27 DIAGNOSIS — Z7951 Long term (current) use of inhaled steroids: Secondary | ICD-10-CM | POA: Diagnosis not present

## 2015-07-27 DIAGNOSIS — R42 Dizziness and giddiness: Secondary | ICD-10-CM | POA: Diagnosis present

## 2015-07-27 DIAGNOSIS — R1110 Vomiting, unspecified: Secondary | ICD-10-CM | POA: Diagnosis not present

## 2015-07-27 DIAGNOSIS — R197 Diarrhea, unspecified: Secondary | ICD-10-CM | POA: Diagnosis not present

## 2015-07-27 DIAGNOSIS — E119 Type 2 diabetes mellitus without complications: Secondary | ICD-10-CM | POA: Diagnosis not present

## 2015-07-27 LAB — CBC WITH DIFFERENTIAL/PLATELET
Basophils Absolute: 0 10*3/uL (ref 0–0.1)
Basophils Relative: 0 %
Eosinophils Absolute: 0.1 10*3/uL (ref 0–0.7)
Eosinophils Relative: 0 %
HCT: 46.8 % (ref 35.0–47.0)
Hemoglobin: 15.8 g/dL (ref 12.0–16.0)
Lymphocytes Relative: 6 %
Lymphs Abs: 0.8 10*3/uL — ABNORMAL LOW (ref 1.0–3.6)
MCH: 30.6 pg (ref 26.0–34.0)
MCHC: 33.8 g/dL (ref 32.0–36.0)
MCV: 90.3 fL (ref 80.0–100.0)
Monocytes Absolute: 0.4 10*3/uL (ref 0.2–0.9)
Monocytes Relative: 3 %
Neutro Abs: 13.6 10*3/uL — ABNORMAL HIGH (ref 1.4–6.5)
Neutrophils Relative %: 91 %
Platelets: 274 10*3/uL (ref 150–440)
RBC: 5.18 MIL/uL (ref 3.80–5.20)
RDW: 13.8 % (ref 11.5–14.5)
WBC: 14.9 10*3/uL — ABNORMAL HIGH (ref 3.6–11.0)

## 2015-07-27 LAB — URINALYSIS COMPLETE WITH MICROSCOPIC (ARMC ONLY)
Bilirubin Urine: NEGATIVE
Glucose, UA: >500 mg/dL — AB
Hgb urine dipstick: NEGATIVE
Ketones, ur: NEGATIVE mg/dL
Leukocytes, UA: NEGATIVE
Nitrite: NEGATIVE
Protein, ur: NEGATIVE mg/dL
Specific Gravity, Urine: 1.039 — ABNORMAL HIGH (ref 1.005–1.030)
pH: 6 (ref 5.0–8.0)

## 2015-07-27 LAB — COMPREHENSIVE METABOLIC PANEL
ALT: 13 U/L — ABNORMAL LOW (ref 14–54)
AST: 17 U/L (ref 15–41)
Albumin: 4.3 g/dL (ref 3.5–5.0)
Alkaline Phosphatase: 58 U/L (ref 38–126)
Anion gap: 9 (ref 5–15)
BUN: 11 mg/dL (ref 6–20)
CO2: 27 mmol/L (ref 22–32)
Calcium: 9.2 mg/dL (ref 8.9–10.3)
Chloride: 104 mmol/L (ref 101–111)
Creatinine, Ser: 0.48 mg/dL (ref 0.44–1.00)
GFR calc Af Amer: >60 mL/min (ref >60)
GFR calc non Af Amer: >60 mL/min (ref >60)
Glucose, Bld: 279 mg/dL — ABNORMAL HIGH (ref 65–99)
Potassium: 3.5 mmol/L (ref 3.5–5.1)
Sodium: 140 mmol/L (ref 135–145)
Total Bilirubin: 0.7 mg/dL (ref 0.3–1.2)
Total Protein: 7.1 g/dL (ref 6.5–8.1)

## 2015-07-27 LAB — POCT PREGNANCY, URINE: Preg Test, Ur: NEGATIVE

## 2015-07-27 MED ORDER — DIPHENHYDRAMINE HCL 50 MG/ML IJ SOLN
25.0000 mg | Freq: Once | INTRAMUSCULAR | Status: AC
  Administered 2015-07-27: 25 mg via INTRAVENOUS
  Filled 2015-07-27: qty 1

## 2015-07-27 MED ORDER — SODIUM CHLORIDE 0.9 % IV SOLN
Freq: Once | INTRAVENOUS | Status: AC
  Administered 2015-07-27: 22:00:00 via INTRAVENOUS

## 2015-07-27 MED ORDER — ONDANSETRON HCL 4 MG/2ML IJ SOLN
4.0000 mg | Freq: Once | INTRAMUSCULAR | Status: AC
  Administered 2015-07-27: 4 mg via INTRAVENOUS
  Filled 2015-07-27: qty 2

## 2015-07-27 MED ORDER — KETOROLAC TROMETHAMINE 30 MG/ML IJ SOLN
30.0000 mg | Freq: Once | INTRAMUSCULAR | Status: AC
  Administered 2015-07-27: 30 mg via INTRAVENOUS
  Filled 2015-07-27: qty 1

## 2015-07-27 MED ORDER — PROMETHAZINE HCL 25 MG PO TABS: 25.0000 mg | ORAL_TABLET | Freq: Four times a day (QID) | ORAL | Status: DC | PRN

## 2015-07-27 NOTE — Discharge Instructions (Signed)
Weakness Weakness is a lack of strength. It may be felt all over the body (generalized) or in one specific part of the body (focal). Some causes of weakness can be serious. You may need further medical evaluation, especially if you are elderly or you have a history of immunosuppression (such as chemotherapy or HIV), kidney disease, heart disease, or diabetes. CAUSES  Weakness can be caused by many different things, including:  Infection.  Physical exhaustion.  Internal bleeding or other blood loss that results in a lack of red blood cells (anemia).  Dehydration. This cause is more common in elderly people.  Side effects or electrolyte abnormalities from medicines, such as pain medicines or sedatives.  Emotional distress, anxiety, or depression.  Circulation problems, especially severe peripheral arterial disease.  Heart disease, such as rapid atrial fibrillation, bradycardia, or heart failure.  Nervous system disorders, such as Guillain-Barr syndrome, multiple sclerosis, or stroke. DIAGNOSIS  To find the cause of your weakness, your caregiver will take your history and perform a physical exam. Lab tests or X-rays may also be ordered, if needed. TREATMENT  Treatment of weakness depends on the cause of your symptoms and can vary greatly. HOME CARE INSTRUCTIONS   Rest as needed.  Eat a well-balanced diet.  Try to get some exercise every day.  Only take over-the-counter or prescription medicines as directed by your caregiver. SEEK MEDICAL CARE IF:   Your weakness seems to be getting worse or spreads to other parts of your body.  You develop new aches or pains. SEEK IMMEDIATE MEDICAL CARE IF:   You cannot perform your normal daily activities, such as getting dressed and feeding yourself.  You cannot walk up and down stairs, or you feel exhausted when you do so.  You have shortness of breath or chest pain.  You have difficulty moving parts of your body.  You have  weakness in only one area of the body or on only one side of the body.  You have a fever.  You have trouble speaking or swallowing.  You cannot control your bladder or bowel movements.  You have black or bloody vomit or stools. MAKE SURE YOU:  Understand these instructions.  Will watch your condition.  Will get help right away if you are not doing well or get worse.   This information is not intended to replace advice given to you by your health care provider. Make sure you discuss any questions you have with your health care provider.   Document Released: 12/24/2004 Document Revised: 06/25/2011 Document Reviewed: 02/22/2011 Elsevier Interactive Patient Education 2016 [employer] Norovirus Infection A norovirus infection is caused by exposure to a virus in a group of similar viruses (noroviruses). This type of infection causes inflammation in your stomach and intestines (gastroenteritis). Norovirus is the most common cause of gastroenteritis. It also causes food poisoning. Anyone can get a norovirus infection. It spreads very easily (contagious). You can get it from contaminated food, water, surfaces, or other people. Norovirus is found in the stool or vomit of infected people. You can spread the infection as soon as you feel sick until 2 weeks after you recover.  Symptoms usually begin within 2 days after you become infected. Most norovirus symptoms affect the digestive system. CAUSES Norovirus infection is caused by contact with norovirus. You can catch norovirus if you:  Eat or drink something contaminated with norovirus.  Touch surfaces or objects contaminated with norovirus and then put your hand in your mouth.  Have direct contact  with an infected person who has symptoms.  Share food, drink, or utensils with someone with who is sick with norovirus. SIGNS AND SYMPTOMS Symptoms of norovirus may include:  Nausea.  Vomiting.  Diarrhea.  Stomach  cramps.  Fever.  Chills.  Headache.  Muscle aches.  Tiredness. DIAGNOSIS Your health care provider may suspect norovirus based on your symptoms and physical exam. Your health care provider may also test a sample of your stool or vomit for the virus.  TREATMENT There is no specific treatment for norovirus. Most people get better without treatment in about 2 days. HOME CARE INSTRUCTIONS  Replace lost fluids by drinking plenty of water or rehydration fluids containing important minerals called electrolytes. This prevents dehydration. Drink enough fluid to keep your urine clear or pale yellow.  Do not prepare food for others while you are infected. Wait at least 3 days after recovering from the illness to do that. PREVENTION   Wash your hands often, especially after using the toilet or changing a diaper.  Wash fruits and vegetables thoroughly before preparing or serving them.  Throw out any food that a sick person may have touched.  Disinfect contaminated surfaces immediately after someone in the household has been sick. Use a bleach-based household cleaner.  Immediately remove and wash soiled clothes or sheets. SEEK MEDICAL CARE IF:  Your vomiting, diarrhea, and stomach pain is getting worse.  Your symptoms of norovirus do not go away after 2-3 days. SEEK IMMEDIATE MEDICAL CARE IF:  You develop symptoms of dehydration that do not improve with fluid replacement. This may include:  Excessive sleepiness.  Lack of tears.  Dry mouth.  Dizziness when standing.  Weak pulse.   This information is not intended to replace advice given to you by your health care provider. Make sure you discuss any questions you have with your health care provider.   Document Released: 03/16/2002 Document Revised: 01/14/2014 Document Reviewed: 06/03/2013 Elsevier Interactive Patient Education [employer]

## 2015-07-27 NOTE — ED Provider Notes (Signed)
[facility] Emergency Department Provider Note        Time seen: ----------------------------------------- 10:06 PM on 07/27/2015 -----------------------------------------    I have reviewed the triage vital signs and the nursing notes.   HISTORY  Chief Complaint Emesis; Diarrhea; Abdominal Pain; and Dizziness    HPI Kristen Tran is a 25 y.o. female who presents to ER for abdominal pain, vomiting and diarrhea that started around noon today. Patient states she also felt like she was going to pass out. In addition to this she felt like her tongue swelling, felt like her hands were swelling. She denies being anxious, denies fevers, other complaints at this time.   Past Medical History  Diagnosis Date  . Asthma   . GERD (gastroesophageal reflux disease)   . Anemia   . Diabetes mellitus without complication (HCC)   . Atopic dermatitis   . Bronchopneumonia   . Hypertension   . Obesity   . Anxiety   . Depression   . Gastroparesis     Patient Active Problem List   Diagnosis Date Noted  . Hydradenitis 06/07/2015  . Abscess of axilla, right 05/26/2015  . Insomnia 05/26/2015  . Vomiting 03/06/2015  . Cervicitis 03/06/2015  . Gastroenteritis 10/17/2014  . Metrorrhagia 10/17/2014  . Chronic LBP 08/24/2014  . Asthma 07/07/2014  . GERD (gastroesophageal reflux disease) 07/07/2014  . Anemia 07/07/2014  . Diabetes (HCC) 07/07/2014  . Paresthesia 07/07/2014  . Atopic dermatitis 07/07/2014  . Bronchopneumonia 07/07/2014  . Hyperlipidemia 07/07/2014  . Hypertension 07/07/2014  . Obesity 07/07/2014  . Acute anxiety 07/07/2014  . Eczema 07/07/2014  . LBP (low back pain) 02/23/2014  . Pins and needles sensation 02/23/2014  . Diabetic gastroparesis associated with type 2 diabetes mellitus (HCC) 10/12/2013  . Biliary calculi 04/22/2013  . Diabetes mellitus (HCC) 04/22/2013  . Allergic rhinitis, seasonal 09/09/2012    Past Surgical  History  Procedure Laterality Date  . Cholecystectomy    . Fracture surgery    . Wisdom tooth extraction      Allergies Bee venom; Contrast media; Ibuprofen; Morphine sulfate; and Tomato  Social History Social History  Substance Use Topics  . Smoking status: Never Smoker   . Smokeless tobacco: Never Used  . Alcohol Use: 0.0 - 1.2 oz/week    0-2 Standard drinks or equivalent per week     Comment: on occasion    Review of Systems Constitutional: Negative for fever. Eyes: Negative for visual changes. ENT: Positive for tongue and lip swelling Cardiovascular: Negative for chest pain. Respiratory: Negative for shortness of breath. Gastrointestinal: Positive for abdominal pain, vomiting and diarrhea Genitourinary: Negative for dysuria. Musculoskeletal: Negative for back pain. Skin: Negative for rash. Neurological: Negative for headaches, focal weakness or numbness.  10-point ROS otherwise negative.  ____________________________________________   PHYSICAL EXAM:  VITAL SIGNS: ED Triage Vitals  Enc Vitals Group     BP 07/27/15 2123 111/69 mmHg     Pulse Rate 07/27/15 2123 81     Resp 07/27/15 2123 18     Temp 07/27/15 2123 98.1 F (36.7 C)     Temp Source 07/27/15 2123 Oral     SpO2 07/27/15 2123 98 %     Weight 07/27/15 2123 189 lb (85.73 kg)     Height 07/27/15 2123  (1.651 m)     Head Cir --      Peak Flow --      Pain Score 07/27/15 2124 6     Pain Loc --  Pain Edu? --      Excl. in GC? --     Constitutional: Alert and oriented. Well appearing and in no distress. Eyes: Conjunctivae are normal. PERRL. Normal extraocular movements. ENT   Head: Normocephalic and atraumatic.   Nose: No congestion/rhinnorhea.   Mouth/Throat: Mucous membranes are moist.   Neck: No stridor. Cardiovascular: Normal rate, regular rhythm. No murmurs, rubs, or gallops. Respiratory: Normal respiratory effort without tachypnea nor retractions. Breath sounds are  clear and equal bilaterally. No wheezes/rales/rhonchi. Gastrointestinal: Soft and nontender. Normal bowel sounds Musculoskeletal: Nontender with normal range of motion in all extremities. No lower extremity tenderness nor edema. Neurologic:  Normal speech and language. No gross focal neurologic deficits are appreciated.  Skin:  Skin is warm, dry and intact. No rash noted. Psychiatric: Mood and affect are normal. Speech and behavior are normal.  ___________________________________________  ED COURSE:  Pertinent labs & imaging results that were available during my care of the patient were reviewed by me and considered in my medical decision making (see chart for details). Patient is in no acute distress, I will check basic labs, give IV fluid and reevaluate. Reportedly her blood sugar has been normal today. ____________________________________________    LABS (pertinent positives/negatives)  Labs Reviewed  CBC WITH DIFFERENTIAL/PLATELET - Abnormal; Notable for the following:    WBC 14.9 (*)    Neutro Abs 13.6 (*)    Lymphs Abs 0.8 (*)    All other components within normal limits  COMPREHENSIVE METABOLIC PANEL - Abnormal; Notable for the following:    Glucose, Bld 279 (*)    ALT 13 (*)    All other components within normal limits  URINALYSIS COMPLETEWITH MICROSCOPIC ([facility] ONLY) - Abnormal; Notable for the following:    Color, Urine YELLOW (*)    APPearance CLEAR (*)    Glucose, UA >500 (*)    Specific Gravity, Urine 1.039 (*)    Bacteria, UA RARE (*)    Squamous Epithelial / LPF 0-5 (*)    All other components within normal limits  POC URINE PREG, ED  POCT PREGNANCY, URINE   ____________________________________________  FINAL ASSESSMENT AND PLAN  Vomiting and diarrhea, weakness  Plan: Patient with labs and imaging as dictated above. Patient likely with viral etiology resulting in dehydration and weakness. Currently she is feeling better, she'll be discharged with Phenergan.  I would advise Benadryl showed her allergic type symptoms recur. She is stable for outpatient follow-up with her doctor.   Emily FilbertWilliams, Tayveon Lombardo E, MD   Note: This dictation was prepared with Dragon dictation. Any transcriptional errors that result from this process are unintentional   Emily FilbertJonathan E Kasheem Toner, MD 07/27/15 2258

## 2015-07-27 NOTE — ED Notes (Signed)
Patient reports that she developed abd pain, nausea, vomiting and diarrhea around noon today. Patient reports that she has also stared feeling like she is going to pass out.

## 2015-08-03 ENCOUNTER — Encounter: Payer: Self-pay | Admitting: Family Medicine

## 2015-08-03 ENCOUNTER — Ambulatory Visit (INDEPENDENT_AMBULATORY_CARE_PROVIDER_SITE_OTHER): Payer: BLUE CROSS/BLUE SHIELD | Admitting: Family Medicine

## 2015-08-03 VITALS — BP 141/93 | HR 83 | Temp 98.3°F | Wt 192.0 lb

## 2015-08-03 DIAGNOSIS — R1084 Generalized abdominal pain: Secondary | ICD-10-CM

## 2015-08-03 DIAGNOSIS — K3184 Gastroparesis: Secondary | ICD-10-CM | POA: Diagnosis not present

## 2015-08-03 DIAGNOSIS — E1143 Type 2 diabetes mellitus with diabetic autonomic (poly)neuropathy: Secondary | ICD-10-CM

## 2015-08-03 LAB — CBC WITH DIFFERENTIAL/PLATELET
Hematocrit: 40.5 % (ref 34.0–46.6)
Hemoglobin: 14.7 g/dL (ref 11.1–15.9)
Lymphocytes Absolute: 1.1 10*3/uL (ref 0.7–3.1)
Lymphs: 18 %
MCH: 31.7 pg (ref 26.6–33.0)
MCHC: 36.3 g/dL — ABNORMAL HIGH (ref 31.5–35.7)
MCV: 88 fL (ref 79–97)
MID (Absolute): 0.4 10*3/uL (ref 0.1–1.6)
MID: 6 %
Neutrophils Absolute: 4.9 10*3/uL (ref 1.4–7.0)
Neutrophils: 76 %
Platelets: 244 10*3/uL (ref 150–379)
RBC: 4.63 x10E6/uL (ref 3.77–5.28)
RDW: 13 % (ref 12.3–15.4)
WBC: 6.4 10*3/uL (ref 3.4–10.8)

## 2015-08-03 LAB — UA/M W/RFLX CULTURE, ROUTINE
Bilirubin, UA: NEGATIVE
Leukocytes, UA: NEGATIVE
Nitrite, UA: NEGATIVE
Protein, UA: NEGATIVE
RBC, UA: NEGATIVE
Specific Gravity, UA: 1.015 (ref 1.005–1.030)
Urobilinogen, Ur: 1 mg/dL (ref 0.2–1.0)
pH, UA: 6.5 (ref 5.0–7.5)

## 2015-08-03 NOTE — Progress Notes (Signed)
BP (!) 141/93   Pulse 83   Temp 98.3 F (36.8 C)   Wt 192 lb (87.1 kg)   LMP 07/12/2015 (Exact Date)   BMI 31.95 kg/m    Subjective:    Patient ID: Kristen Tran, female    DOB: 19-Sep-1990, 25 y.o.   MRN: [id number]  HPI: Kristen Tran is a 25 y.o. female  Chief Complaint  Patient presents with  . Other    ED follow up, she is still itching. Going on for approx a week. No soap/detergant changes. Mostly itching around her lips, tongue and a spot on her upper back.  . Dizziness    has spells where she gets really hot and weak and vomiting and diarrhea. She's been told in the past she was dehydrated and needed fluids.    Patient presents for an ER f/u for N/V/D, HAs, abdominal pain x 7 days that was decided to likely be a viral illness. She is feeling some better than Saturday when she presented to the ER, but still having diarrhea at least once a day and vomiting almost every day. Having mid-epigastric pain that is dull and achy. Denies fevers, but has chills and sweats intermittently. Feels dizzy but has not passed out. Hx of uncontrolled DM2 with gastroparesis not currently on reglan. Also on chronic augmentin for Hidradenitis since January.   WBC were 14.9 in ER and she was dehydrated. Has been pushing gatorade and water heavily since d/c. BS have been widely variable. Pt has been compliant with medications.   Relevant past medical, surgical, family and social history reviewed and updated as indicated. Interim medical history since our last visit reviewed. Allergies and medications reviewed and updated.  Review of Systems  Constitutional: Positive for chills, diaphoresis and fatigue.  Respiratory: Negative.   Cardiovascular: Negative.   Gastrointestinal: Positive for abdominal pain, diarrhea, nausea and vomiting.  Genitourinary: Negative.   Musculoskeletal: Negative.   Neurological: Positive for dizziness and headaches.    Psychiatric/Behavioral: Negative.     Per HPI unless specifically indicated above     Objective:    BP (!) 141/93   Pulse 83   Temp 98.3 F (36.8 C)   Wt 192 lb (87.1 kg)   LMP 07/12/2015 (Exact Date)   BMI 31.95 kg/m   Wt Readings from Last 3 Encounters:  08/03/15 192 lb (87.1 kg)  07/27/15 189 lb (85.7 kg)  07/06/15 191 lb (86.6 kg)    Physical Exam  Constitutional: She is oriented to person, place, and time. She appears well-developed and well-nourished.  HENT:  Head: Atraumatic.  Eyes: Conjunctivae are normal. No scleral icterus.  Neck: Normal range of motion. Neck supple.  Cardiovascular: Normal rate and normal heart sounds.   Pulmonary/Chest: Effort normal. No respiratory distress.  Abdominal: Soft. Bowel sounds are normal. There is no tenderness.  Musculoskeletal: Normal range of motion.  Neurological: She is alert and oriented to person, place, and time.  Skin: Skin is warm and dry.  Psychiatric: She has a normal mood and affect. Her behavior is normal.  Nursing note and vitals reviewed.   Results for orders placed or performed during the hospital encounter of 07/27/15  CBC with Differential  Result Value Ref Range   WBC 14.9 (H) 3.6 - 11.0 K/uL   RBC 5.18 3.80 - 5.20 MIL/uL   Hemoglobin 15.8 12.0 - 16.0 g/dL   HCT 46.8 35.0 - 47.0 %   MCV 90.3 80.0 - 100.0 fL   MCH 30.6  26.0 - 34.0 pg   MCHC 33.8 32.0 - 36.0 g/dL   RDW 13.8 11.5 - 14.5 %   Platelets 274 150 - 440 K/uL   Neutrophils Relative % 91 %   Neutro Abs 13.6 (H) 1.4 - 6.5 K/uL   Lymphocytes Relative 6 %   Lymphs Abs 0.8 (L) 1.0 - 3.6 K/uL   Monocytes Relative 3 %   Monocytes Absolute 0.4 0.2 - 0.9 K/uL   Eosinophils Relative 0 %   Eosinophils Absolute 0.1 0 - 0.7 K/uL   Basophils Relative 0 %   Basophils Absolute 0.0 0 - 0.1 K/uL  Comprehensive metabolic panel  Result Value Ref Range   Sodium 140 135 - 145 mmol/L   Potassium 3.5 3.5 - 5.1 mmol/L   Chloride 104 101 - 111 mmol/L   CO2 27  22 - 32 mmol/L   Glucose, Bld 279 (H) 65 - 99 mg/dL   BUN 11 6 - 20 mg/dL   Creatinine, Ser 0.48 0.44 - 1.00 mg/dL   Calcium 9.2 8.9 - 10.3 mg/dL   Total Protein 7.1 6.5 - 8.1 g/dL   Albumin 4.3 3.5 - 5.0 g/dL   AST 17 15 - 41 U/L   ALT 13 (L) 14 - 54 U/L   Alkaline Phosphatase 58 38 - 126 U/L   Total Bilirubin 0.7 0.3 - 1.2 mg/dL   GFR calc non Af Amer >60 >60 mL/min   GFR calc Af Amer >60 >60 mL/min   Anion gap 9 5 - 15  Urinalysis complete, with microscopic  Result Value Ref Range   Color, Urine YELLOW (A) YELLOW   APPearance CLEAR (A) CLEAR   Glucose, UA >500 (A) NEGATIVE mg/dL   Bilirubin Urine NEGATIVE NEGATIVE   Ketones, ur NEGATIVE NEGATIVE mg/dL   Specific Gravity, Urine 1.039 (H) 1.005 - 1.030   Hgb urine dipstick NEGATIVE NEGATIVE   pH 6.0 5.0 - 8.0   Protein, ur NEGATIVE NEGATIVE mg/dL   Nitrite NEGATIVE NEGATIVE   Leukocytes, UA NEGATIVE NEGATIVE   RBC / HPF 0-5 0 - 5 RBC/hpf   WBC, UA 0-5 0 - 5 WBC/hpf   Bacteria, UA RARE (A) NONE SEEN   Squamous Epithelial / LPF 0-5 (A) NONE SEEN  Pregnancy, urine POC  Result Value Ref Range   Preg Test, Ur NEGATIVE NEGATIVE      Assessment & Plan:   Problem List Items Addressed This Visit    None    Visit Diagnoses    Generalized abdominal pain    -  Primary   Relevant Orders   CBC With Differential/Platelet   Comprehensive metabolic panel   Stool C-Diff Toxin Assay   UA/M w/rflx Culture, Routine    WBC is 6.4 today, and U/A is clear of UTI. Will send a stool kit home to test for c.diff given her chronic antibiotic use. Recommended she speak with her gastroenterologist today about getting back on reglan as some of her symptoms may be attributable to that. Await CMP and urine culture.  Pt missed endocrinology appt recently due to her hospital visit - recommended she reschedule right away as her sugars are under poor control.   Follow up plan: Return if symptoms worsen or fail to improve.

## 2015-08-03 NOTE — Patient Instructions (Signed)
Follow up with gastroenterology today by phone or mychart regarding getting back on the reglan. Follow up if no improvement

## 2015-08-04 ENCOUNTER — Encounter: Payer: Self-pay | Admitting: Unknown Physician Specialty

## 2015-08-04 ENCOUNTER — Ambulatory Visit (INDEPENDENT_AMBULATORY_CARE_PROVIDER_SITE_OTHER): Payer: BLUE CROSS/BLUE SHIELD | Admitting: Unknown Physician Specialty

## 2015-08-04 ENCOUNTER — Encounter: Payer: Self-pay | Admitting: Family Medicine

## 2015-08-04 VITALS — BP 143/96 | HR 79 | Temp 98.3°F | Ht 65.2 in | Wt 192.0 lb

## 2015-08-04 DIAGNOSIS — Z0000 Encounter for general adult medical examination without abnormal findings: Secondary | ICD-10-CM

## 2015-08-04 DIAGNOSIS — Z794 Long term (current) use of insulin: Secondary | ICD-10-CM | POA: Diagnosis not present

## 2015-08-04 DIAGNOSIS — L309 Dermatitis, unspecified: Secondary | ICD-10-CM

## 2015-08-04 DIAGNOSIS — E119 Type 2 diabetes mellitus without complications: Secondary | ICD-10-CM

## 2015-08-04 DIAGNOSIS — Z202 Contact with and (suspected) exposure to infections with a predominantly sexual mode of transmission: Secondary | ICD-10-CM

## 2015-08-04 DIAGNOSIS — N921 Excessive and frequent menstruation with irregular cycle: Secondary | ICD-10-CM

## 2015-08-04 LAB — COMPREHENSIVE METABOLIC PANEL
ALT: 12 IU/L (ref 0–32)
AST: 7 IU/L (ref 0–40)
Albumin/Globulin Ratio: 1.8 (ref 1.2–2.2)
Albumin: 3.9 g/dL (ref 3.5–5.5)
Alkaline Phosphatase: 57 IU/L (ref 39–117)
BUN/Creatinine Ratio: 28 — ABNORMAL HIGH (ref 9–23)
BUN: 16 mg/dL (ref 6–20)
Bilirubin Total: 0.4 mg/dL (ref 0.0–1.2)
CO2: 26 mmol/L (ref 18–29)
Calcium: 8.7 mg/dL (ref 8.7–10.2)
Chloride: 100 mmol/L (ref 96–106)
Creatinine, Ser: 0.57 mg/dL (ref 0.57–1.00)
GFR calc Af Amer: 150 mL/min/{1.73_m2} (ref >59)
GFR calc non Af Amer: 130 mL/min/{1.73_m2} (ref >59)
Globulin, Total: 2.2 g/dL (ref 1.5–4.5)
Glucose: 292 mg/dL — ABNORMAL HIGH (ref 65–99)
Potassium: 4.3 mmol/L (ref 3.5–5.2)
Sodium: 139 mmol/L (ref 134–144)
Total Protein: 6.1 g/dL (ref 6.0–8.5)

## 2015-08-04 LAB — UA/M W/RFLX CULTURE, ROUTINE
Bilirubin, UA: NEGATIVE
Leukocytes, UA: NEGATIVE
Nitrite, UA: NEGATIVE
Protein, UA: NEGATIVE
RBC, UA: NEGATIVE
Specific Gravity, UA: 1.015 (ref 1.005–1.030)
Urobilinogen, Ur: 0.2 mg/dL (ref 0.2–1.0)
pH, UA: 6 (ref 5.0–7.5)

## 2015-08-04 LAB — MICROALBUMIN, URINE WAIVED
Creatinine, Urine Waived: 100 mg/dL (ref 10–300)
Microalb, Ur Waived: 30 mg/L — ABNORMAL HIGH (ref 0–19)
Microalb/Creat Ratio: <30 mg/g (ref <30)

## 2015-08-04 MED ORDER — NORETHINDRONE 0.35 MG PO TABS: 1.0000 | ORAL_TABLET | Freq: Every day | ORAL | 4 refills | Status: DC

## 2015-08-04 MED ORDER — TRIAMCINOLONE ACETONIDE 0.1 % EX CREA: 1.0000 "application " | TOPICAL_CREAM | Freq: Two times a day (BID) | CUTANEOUS | 12 refills | Status: DC

## 2015-08-04 NOTE — Assessment & Plan Note (Signed)
Refill Triamcinolone 

## 2015-08-04 NOTE — Progress Notes (Signed)
BP (!) 143/96 (BP Location: Left Arm, Patient Position: Sitting, Cuff Size: Large)   Pulse 79   Temp 98.3 F (36.8 C)   Ht 5' 5.2" (1.656 m)   Wt 192 lb (87.1 kg)   LMP 07/12/2015 (Exact Date)   SpO2 99%   BMI 31.75 kg/m    Subjective:    Patient ID: Kristen Tran, female    DOB: July 24, 1990, 25 y.o.   MRN: [id number]  HPI: Kristen Tran is a 25 y.o. female  Chief Complaint  Patient presents with  . Annual Exam  . Eczema    Patient needs refill on her cream   Diabetes She is on the waiting list to see the endocrinologist.  She doesn't have a pump as she would have to pay out of pocket.  Stable sugars today from low 100's to high 100's.  Taking 140 u of Lantus.  Sliding scale Novolog  Diarrhea and vomiting Reviewed ER notes and those from [employer].  She is better but now constipated and c diff is pending.  She does have Gastroperesis and told it was not an effective long acting medication.    Hypertension Using medications without difficulty Average home BPs not checking  No problems or lightheadedness No chest pain with exertion or shortness of breath No Edema No CKD at this time  Social History   Social History  . Marital status: Single    Spouse name: N/A  . Number of children: N/A  . Years of education: N/A   Occupational History  . Not on file.   Social History Main Topics  . Smoking status: Never Smoker  . Smokeless tobacco: Never Used  . Alcohol use 0.0 - 1.2 oz/week     Comment: on occasion  . Drug use: No  . Sexual activity: Not on file   Other Topics Concern  . Not on file   Social History Narrative  . No narrative on file   Past Medical History:  Diagnosis Date  . Anemia   . Anxiety   . Asthma   . Atopic dermatitis   . Bronchopneumonia   . Depression   . Diabetes mellitus without complication (HCC)   . Gastroparesis   . GERD (gastroesophageal reflux disease)   . Hidradenitis suppurativa     . Hypertension   . Obesity    Past Surgical History:  Procedure Laterality Date  . CHOLECYSTECTOMY    . FRACTURE SURGERY    . WISDOM TOOTH EXTRACTION      Relevant past medical, surgical, family and social history reviewed and updated as indicated. Interim medical history since our last visit reviewed. Allergies and medications reviewed and updated.  Review of Systems  Per HPI unless specifically indicated above     Objective:    BP (!) 143/96 (BP Location: Left Arm, Patient Position: Sitting, Cuff Size: Large)   Pulse 79   Temp 98.3 F (36.8 C)   Ht 5' 5.2" (1.656 m)   Wt 192 lb (87.1 kg)   LMP 07/12/2015 (Exact Date)   SpO2 99%   BMI 31.75 kg/m   Wt Readings from Last 3 Encounters:  08/04/15 192 lb (87.1 kg)  08/03/15 192 lb (87.1 kg)  07/27/15 189 lb (85.7 kg)    Physical Exam  Constitutional: She is oriented to person, place, and time. She appears well-developed and well-nourished.  HENT:  Head: Normocephalic and atraumatic.  Eyes: Pupils are equal, round, and reactive to light. Right eye exhibits  no discharge. Left eye exhibits no discharge. No scleral icterus.  Neck: Normal range of motion. Neck supple. Carotid bruit is not present. No thyromegaly present.  Cardiovascular: Normal rate, regular rhythm and normal heart sounds.  Exam reveals no gallop and no friction rub.   No murmur heard. Pulmonary/Chest: Effort normal and breath sounds normal. No respiratory distress. She has no wheezes. She has no rales.  Abdominal: Soft. Bowel sounds are normal. There is no tenderness. There is no rebound.  Genitourinary: No breast swelling, tenderness or discharge.  Musculoskeletal: Normal range of motion.  Lymphadenopathy:    She has no cervical adenopathy.  Neurological: She is alert and oriented to person, place, and time.  Skin: Skin is warm, dry and intact. No rash noted.  Psychiatric: She has a normal mood and affect. Her speech is normal and behavior is normal.  Judgment and thought content normal. Cognition and memory are normal.    Results for orders placed or performed in visit on 08/03/15  CBC With Differential/Platelet  Result Value Ref Range   WBC 6.4 3.4 - 10.8 x10E3/uL   RBC 4.63 3.77 - 5.28 x10E6/uL   Hemoglobin 14.7 11.1 - 15.9 g/dL   Hematocrit 08.6 57.8 - 46.6 %   MCV 88 79 - 97 fL   MCH 31.7 26.6 - 33.0 pg   MCHC 36.3 (H) 31.5 - 35.7 g/dL   RDW 46.9 62.9 - 52.8 %   Platelets 244 150 - 379 x10E3/uL   Neutrophils 76 %   Lymphs 18 %   MID 6 %   Neutrophils Absolute 4.9 1.4 - 7.0 x10E3/uL   Lymphocytes Absolute 1.1 0.7 - 3.1 x10E3/uL   MID (Absolute) 0.4 0.1 - 1.6 X10E3/uL  Comprehensive metabolic panel  Result Value Ref Range   Glucose 292 (H) 65 - 99 mg/dL   BUN 16 6 - 20 mg/dL   Creatinine, Ser 4.13 0.57 - 1.00 mg/dL   GFR calc non Af Amer 130 >59 mL/min/1.73   GFR calc Af Amer 150 >59 mL/min/1.73   BUN/Creatinine Ratio 28 (H) 9 - 23   Sodium 139 134 - 144 mmol/L   Potassium 4.3 3.5 - 5.2 mmol/L   Chloride 100 96 - 106 mmol/L   CO2 26 18 - 29 mmol/L   Calcium 8.7 8.7 - 10.2 mg/dL   Total Protein 6.1 6.0 - 8.5 g/dL   Albumin 3.9 3.5 - 5.5 g/dL   Globulin, Total 2.2 1.5 - 4.5 g/dL   Albumin/Globulin Ratio 1.8 1.2 - 2.2   Bilirubin Total 0.4 0.0 - 1.2 mg/dL   Alkaline Phosphatase 57 39 - 117 IU/L   AST 7 0 - 40 IU/L   ALT 12 0 - 32 IU/L  UA/M w/rflx Culture, Routine  Result Value Ref Range   Specific Gravity, UA 1.015 1.005 - 1.030   pH, UA 6.5 5.0 - 7.5   Color, UA Yellow Yellow   Appearance Ur Cloudy (A) Clear   Leukocytes, UA Negative Negative   Protein, UA Negative Negative/Trace   Glucose, UA 2+ (A) Negative   Ketones, UA 1+ (A) Negative   RBC, UA Negative Negative   Bilirubin, UA Negative Negative   Urobilinogen, Ur 1.0 0.2 - 1.0 mg/dL   Nitrite, UA Negative Negative      Assessment & Plan:   Problem List Items Addressed This Visit      Unprioritized   Diabetes mellitus (HCC)    Through  endocrine      Relevant Orders  Microalbumin, Urine Waived   Eczema    Refill Triamcinolone      Metrorrhagia    States she is doing better       Other Visit Diagnoses    Possible exposure to STD    -  Primary   Relevant Orders   GC/Chlamydia Probe Amp   HIV antibody   HSV(herpes simplex vrs) 1+2 ab-IgG   RPR   Annual physical exam       Relevant Medications   norethindrone (ORTHO MICRONOR) 0.35 MG tablet   Other Relevant Orders   UA/M w/rflx Culture, Routine   GC/Chlamydia Probe Amp   HIV antibody   HSV(herpes simplex vrs) 1+2 ab-IgG   RPR       Follow up plan: Return in about 6 months (around 02/04/2016), or if symptoms worsen or fail to improve.

## 2015-08-04 NOTE — Assessment & Plan Note (Signed)
States she is doing better

## 2015-08-04 NOTE — Assessment & Plan Note (Signed)
Through endocrine

## 2015-08-06 LAB — HSV(HERPES SIMPLEX VRS) I + II AB-IGG
HSV 1 Glycoprotein G Ab, IgG: <0.91 index (ref 0.00–0.90)
HSV 2 Glycoprotein G Ab, IgG: 4.28 index — ABNORMAL HIGH (ref 0.00–0.90)

## 2015-08-06 LAB — HIV ANTIBODY (ROUTINE TESTING W REFLEX): HIV Screen 4th Generation wRfx: NONREACTIVE

## 2015-08-06 LAB — RPR: RPR Ser Ql: NONREACTIVE

## 2015-08-07 ENCOUNTER — Encounter: Payer: Self-pay | Admitting: Unknown Physician Specialty

## 2015-08-07 LAB — GC/CHLAMYDIA PROBE AMP
Chlamydia trachomatis, NAA: NEGATIVE
Neisseria gonorrhoeae by PCR: NEGATIVE

## 2015-09-29 ENCOUNTER — Telehealth: Payer: Self-pay

## 2015-09-29 NOTE — Telephone Encounter (Signed)
Patient called and left a VM yesterday. She states that her dermatologist wants to start her on spironolactone for hydradenitis but since she also has high BP, they want to make sure it is OK. Patient stated that dermatologist wanted to know if we should lower patient's dose of metoprolol or temporarily stop to see how the spironolactone will do.

## 2015-09-29 NOTE — Telephone Encounter (Signed)
I think it will be fine to just start the Spironalactone.

## 2015-09-29 NOTE — Telephone Encounter (Signed)
Called and let patient know what Cheryl said.  

## 2015-10-11 ENCOUNTER — Encounter: Payer: Self-pay | Admitting: Family Medicine

## 2015-10-11 ENCOUNTER — Ambulatory Visit (INDEPENDENT_AMBULATORY_CARE_PROVIDER_SITE_OTHER): Payer: BLUE CROSS/BLUE SHIELD | Admitting: Family Medicine

## 2015-10-11 VITALS — BP 130/87 | HR 92 | Temp 99.3°F | Wt 184.0 lb

## 2015-10-11 DIAGNOSIS — R110 Nausea: Secondary | ICD-10-CM | POA: Diagnosis not present

## 2015-10-11 DIAGNOSIS — Z23 Encounter for immunization: Secondary | ICD-10-CM

## 2015-10-11 DIAGNOSIS — E1165 Type 2 diabetes mellitus with hyperglycemia: Secondary | ICD-10-CM | POA: Diagnosis not present

## 2015-10-11 DIAGNOSIS — Z794 Long term (current) use of insulin: Secondary | ICD-10-CM

## 2015-10-11 LAB — UA/M W/RFLX CULTURE, ROUTINE
Bilirubin, UA: NEGATIVE
Leukocytes, UA: NEGATIVE
Nitrite, UA: NEGATIVE
Protein, UA: NEGATIVE
RBC, UA: NEGATIVE
Specific Gravity, UA: 1.015 (ref 1.005–1.030)
Urobilinogen, Ur: 1 mg/dL (ref 0.2–1.0)
pH, UA: 6 (ref 5.0–7.5)

## 2015-10-11 LAB — CBC WITH DIFFERENTIAL/PLATELET
Hematocrit: 42.1 % (ref 34.0–46.6)
Hemoglobin: 14.7 g/dL (ref 11.1–15.9)
Lymphocytes Absolute: 2.9 10*3/uL (ref 0.7–3.1)
Lymphs: 60 %
MCH: 30.6 pg (ref 26.6–33.0)
MCHC: 34.9 g/dL (ref 31.5–35.7)
MCV: 88 fL (ref 79–97)
MID (Absolute): 0.4 10*3/uL (ref 0.1–1.6)
MID: 7 %
Neutrophils Absolute: 1.5 10*3/uL (ref 1.4–7.0)
Neutrophils: 33 %
Platelets: 204 10*3/uL (ref 150–379)
RBC: 4.81 x10E6/uL (ref 3.77–5.28)
RDW: 12.2 % — ABNORMAL LOW (ref 12.3–15.4)
WBC: 4.8 10*3/uL (ref 3.4–10.8)

## 2015-10-11 MED ORDER — ONDANSETRON 4 MG PO TBDP: 4.0000 mg | ORAL_TABLET | Freq: Three times a day (TID) | ORAL | 0 refills | Status: DC | PRN

## 2015-10-11 NOTE — Patient Instructions (Signed)
Follow up as needed

## 2015-10-11 NOTE — Progress Notes (Signed)
BP 130/87   Pulse 92   Temp 99.3 F (37.4 C)   Wt 184 lb (83.5 kg)   LMP 09/15/2015   SpO2 99%   BMI 30.43 kg/m    Subjective:    Patient ID: Genevieve NorlanderMichaiah Jerusha Nyquist, female    DOB: 03-27-1990, 25 y.o.   MRN: [id number]  HPI: Genevieve NorlanderMichaiah Jerusha Cassada is a 25 y.o. female  Chief Complaint  Patient presents with  . Blood Sugar Problem    sugars were high and couldn't get it down, then went to being very low. She states she doesn't have an appetite and thinks that's why they have been "off". Has appoinment with Endocrine tomorrow.   Patient presents with 2-3 weeks of no appetite and nausea. Has poorly controlled DM 2 followed by endocrinology. States her sugars have been all over the place, 286 highest, lowest was 46. Has mostly been drinking smoothies and powerade which is more sugary than her normal diet. Has been drinking regular sodas to try and keep her sugars up. Has been taking the same amount of her lantus but has been taking much less novolog while her appetite has been off. Takes it usually morning and night right now. Denies fever, abdominal pain, vomiting, or bowel habit changes. Has appt with endocrinology tomorrow but mother wanted her to be seen today as well.    Relevant past medical, surgical, family and social history reviewed and updated as indicated. Interim medical history since our last visit reviewed. Allergies and medications reviewed and updated.  Review of Systems  Constitutional: Negative.   HENT: Negative.   Respiratory: Negative.   Cardiovascular: Negative.   Gastrointestinal: Positive for nausea.  Genitourinary: Positive for frequency.  Musculoskeletal: Negative.   Neurological: Negative.   Psychiatric/Behavioral: Negative.     Per HPI unless specifically indicated above     Objective:    BP 130/87   Pulse 92   Temp 99.3 F (37.4 C)   Wt 184 lb (83.5 kg)   LMP 09/15/2015   SpO2 99%   BMI 30.43 kg/m   Wt Readings from  Last 3 Encounters:  10/11/15 184 lb (83.5 kg)  08/04/15 192 lb (87.1 kg)  08/03/15 192 lb (87.1 kg)    Physical Exam  Constitutional: She is oriented to person, place, and time. She appears well-developed and well-nourished. No distress.  HENT:  Head: Atraumatic.  Eyes: Conjunctivae are normal. No scleral icterus.  Neck: Normal range of motion. Neck supple.  Cardiovascular: Normal rate, regular rhythm and normal heart sounds.   Pulmonary/Chest: Effort normal and breath sounds normal. No respiratory distress.  Abdominal: Soft. Bowel sounds are normal. She exhibits no distension. There is no tenderness.  Musculoskeletal: Normal range of motion.  Neurological: She is alert and oriented to person, place, and time.  Skin: Skin is warm. No rash noted.  Psychiatric: She has a normal mood and affect. Her behavior is normal.    Results for orders placed or performed in visit on 10/11/15  UA/M w/rflx Culture, Routine  Result Value Ref Range   Specific Gravity, UA 1.015 1.005 - 1.030   pH, UA 6.0 5.0 - 7.5   Color, UA Yellow Yellow   Appearance Ur Clear Clear   Leukocytes, UA Negative Negative   Protein, UA Negative Negative/Trace   Glucose, UA 2+ (A) Negative   Ketones, UA 4+ (A) Negative   RBC, UA Negative Negative   Bilirubin, UA Negative Negative   Urobilinogen, Ur 1.0 0.2 - 1.0 mg/dL  Nitrite, UA Negative Negative  CBC With Differential/Platelet  Result Value Ref Range   WBC 4.8 3.4 - 10.8 x10E3/uL   RBC 4.81 3.77 - 5.28 x10E6/uL   Hemoglobin 14.7 11.1 - 15.9 g/dL   Hematocrit 16.1 09.6 - 46.6 %   MCV 88 79 - 97 fL   MCH 30.6 26.6 - 33.0 pg   MCHC 34.9 31.5 - 35.7 g/dL   RDW 04.5 (L) 40.9 - 81.1 %   Platelets 204 150 - 379 x10E3/uL   Neutrophils 33 Not Estab. %   Lymphs 60 Not Estab. %   MID 7 Not Estab. %   Neutrophils Absolute 1.5 1.4 - 7.0 x10E3/uL   Lymphocytes Absolute 2.9 0.7 - 3.1 x10E3/uL   MID (Absolute) 0.4 0.1 - 1.6 X10E3/uL      Assessment & Plan:    Problem List Items Addressed This Visit      Endocrine   Diabetes mellitus (HCC)    Other Visit Diagnoses    Nausea    -  Primary   U/A negative, CBC WNL. Zofran given for as needed use. Encouraged much higher water intake, reduce sugary drinks   Relevant Orders   UA/M w/rflx Culture, Routine (Completed)   CBC With Differential/Platelet (Completed)   Needs flu shot        Patient's nausea and appetite issues have been an ongoing, intermittent issue it appears looking back at previous notes and speaking with patient. Likely due to her very poorly controlled BSs. Did recommend that she start a probiotic to help keep her gut flora balanced. Defer to endocrinology recommendations.    Follow up plan: Return if symptoms worsen or fail to improve.

## 2015-10-25 ENCOUNTER — Emergency Department
Admission: EM | Admit: 2015-10-25 | Discharge: 2015-10-25 | Disposition: A | Discharge status: Home / Self Care | Payer: BLUE CROSS/BLUE SHIELD | Attending: Emergency Medicine | Admitting: Emergency Medicine

## 2015-10-25 ENCOUNTER — Emergency Department: Payer: BLUE CROSS/BLUE SHIELD

## 2015-10-25 ENCOUNTER — Encounter: Payer: Self-pay | Admitting: Emergency Medicine

## 2015-10-25 DIAGNOSIS — Z794 Long term (current) use of insulin: Secondary | ICD-10-CM | POA: Diagnosis not present

## 2015-10-25 DIAGNOSIS — E119 Type 2 diabetes mellitus without complications: Secondary | ICD-10-CM | POA: Insufficient documentation

## 2015-10-25 DIAGNOSIS — R531 Weakness: Secondary | ICD-10-CM

## 2015-10-25 DIAGNOSIS — J45909 Unspecified asthma, uncomplicated: Secondary | ICD-10-CM | POA: Insufficient documentation

## 2015-10-25 DIAGNOSIS — R42 Dizziness and giddiness: Secondary | ICD-10-CM | POA: Insufficient documentation

## 2015-10-25 DIAGNOSIS — Z79899 Other long term (current) drug therapy: Secondary | ICD-10-CM | POA: Insufficient documentation

## 2015-10-25 DIAGNOSIS — I10 Essential (primary) hypertension: Secondary | ICD-10-CM | POA: Diagnosis not present

## 2015-10-25 DIAGNOSIS — Z7984 Long term (current) use of oral hypoglycemic drugs: Secondary | ICD-10-CM | POA: Diagnosis not present

## 2015-10-25 LAB — URINALYSIS COMPLETE WITH MICROSCOPIC (ARMC ONLY)
Bilirubin Urine: NEGATIVE
Glucose, UA: >500 mg/dL — AB
Hgb urine dipstick: NEGATIVE
Leukocytes, UA: NEGATIVE
Nitrite: NEGATIVE
Protein, ur: NEGATIVE mg/dL
Specific Gravity, Urine: 1.036 — ABNORMAL HIGH (ref 1.005–1.030)
pH: 5 (ref 5.0–8.0)

## 2015-10-25 LAB — PREGNANCY, URINE: Preg Test, Ur: NEGATIVE

## 2015-10-25 LAB — BASIC METABOLIC PANEL
Anion gap: 9 (ref 5–15)
BUN: 18 mg/dL (ref 6–20)
CO2: 25 mmol/L (ref 22–32)
Calcium: 9.2 mg/dL (ref 8.9–10.3)
Chloride: 101 mmol/L (ref 101–111)
Creatinine, Ser: 0.74 mg/dL (ref 0.44–1.00)
GFR calc Af Amer: >60 mL/min (ref >60)
GFR calc non Af Amer: >60 mL/min (ref >60)
Glucose, Bld: 320 mg/dL — ABNORMAL HIGH (ref 65–99)
Potassium: 4 mmol/L (ref 3.5–5.1)
Sodium: 135 mmol/L (ref 135–145)

## 2015-10-25 LAB — CBC
HCT: 44.4 % (ref 35.0–47.0)
Hemoglobin: 15.4 g/dL (ref 12.0–16.0)
MCH: 29.7 pg (ref 26.0–34.0)
MCHC: 34.7 g/dL (ref 32.0–36.0)
MCV: 85.5 fL (ref 80.0–100.0)
Platelets: 206 10*3/uL (ref 150–440)
RBC: 5.2 MIL/uL (ref 3.80–5.20)
RDW: 12.5 % (ref 11.5–14.5)
WBC: 6.2 10*3/uL (ref 3.6–11.0)

## 2015-10-25 LAB — TROPONIN I: Troponin I: <0.03 ng/mL (ref <0.03)

## 2015-10-25 MED ORDER — PROCHLORPERAZINE EDISYLATE 5 MG/ML IJ SOLN
5.0000 mg | Freq: Once | INTRAMUSCULAR | Status: AC
  Administered 2015-10-25: 5 mg via INTRAVENOUS
  Filled 2015-10-25: qty 2

## 2015-10-25 MED ORDER — ONDANSETRON HCL 4 MG/2ML IJ SOLN
4.0000 mg | Freq: Once | INTRAMUSCULAR | Status: AC
  Administered 2015-10-25: 4 mg via INTRAVENOUS
  Filled 2015-10-25: qty 2

## 2015-10-25 MED ORDER — SODIUM CHLORIDE 0.9 % IV BOLUS (SEPSIS)
1000.0000 mL | Freq: Once | INTRAVENOUS | Status: AC
  Administered 2015-10-25: 1000 mL via INTRAVENOUS

## 2015-10-25 NOTE — ED Notes (Signed)
Assisted patient to bathroom.

## 2015-10-25 NOTE — ED Triage Notes (Signed)
Pt arrived to the ED via EMS from home for complaints of dizziness and nausea. Pt's mother states that the Pt is a diabetic and that she woke up no feeling well, therefore she gave food to the Pt. Pt's BG was 210 and the Pt is tired and lethargic upon arrival.

## 2015-10-25 NOTE — ED Provider Notes (Signed)
Patient reassessed. Patient received in signout from Dr. Zenda AlpersWebster. Blood work reassuring.  CT head wnl.  Patient currently ambulating without any distress. No focal neurodeficits. No evidence of DKA. Symptoms improved with IV hydration. No evidence of acute infectious process. Discussed signs and symptoms for which patient should return to the Er.  Have discussed with the patient and available family all diagnostics and treatments performed thus far and all questions were answered to the best of my ability. The patient demonstrates understanding and agreement with plan.    Willy EddyPatrick Ireland Chagnon, MD 10/25/15 1110

## 2015-10-25 NOTE — ED Provider Notes (Signed)
[facility] Emergency Department Provider Note   ____________________________________________   First MD Initiated Contact with Patient 10/25/15 0543     (approximate)  I have reviewed the triage vital signs and the nursing notes.   HISTORY  Chief Complaint Dizziness    HPI Kristen Tran is a 25 y.o. female who comes into the hospital today with some weakness. The patient's mother reports that she told her she felt as though she was going to pass out. The patient also reports that she felt as though she was given a vomiting. Mom reports that the patient became lethargic and became minimally responsive. She did not have any seizure-like activity   Past Medical History:  Diagnosis Date  . Anemia   . Anxiety   . Asthma   . Atopic dermatitis   . Bronchopneumonia   . Depression   . Diabetes mellitus without complication (HCC)   . Gastroparesis   . GERD (gastroesophageal reflux disease)   . Hidradenitis suppurativa   . Hypertension   . Obesity     Patient Active Problem List   Diagnosis Date Noted  . Hydradenitis 06/07/2015  . Abscess of axilla, right 05/26/2015  . Insomnia 05/26/2015  . Vomiting 03/06/2015  . Cervicitis 03/06/2015  . Gastroenteritis 10/17/2014  . Metrorrhagia 10/17/2014  . Chronic LBP 08/24/2014  . Asthma 07/07/2014  . GERD (gastroesophageal reflux disease) 07/07/2014  . Anemia 07/07/2014  . Paresthesia 07/07/2014  . Atopic dermatitis 07/07/2014  . Hyperlipidemia 07/07/2014  . Hypertension 07/07/2014  . Obesity 07/07/2014  . Acute anxiety 07/07/2014  . Eczema 07/07/2014  . LBP (low back pain) 02/23/2014  . Pins and needles sensation 02/23/2014  . Diabetic gastroparesis associated with type 2 diabetes mellitus (HCC) 10/12/2013  . Biliary calculi 04/22/2013  . Diabetes mellitus (HCC) 04/22/2013  . Allergic rhinitis, seasonal 09/09/2012    Past Surgical History:  Procedure Laterality Date  .  CHOLECYSTECTOMY    . FRACTURE SURGERY    . WISDOM TOOTH EXTRACTION      Prior to Admission medications   Medication Sig Start Date End Date Taking? Authorizing Provider  albuterol (VENTOLIN HFA) 108 (90 BASE) MCG/ACT inhaler Inhale 2 puffs into the lungs every 6 (six) hours as needed for wheezing or shortness of breath.    Historical Provider, MD  canagliflozin (INVOKANA) 100 MG TABS tablet Take 100 mg by mouth daily. 04/12/15   Historical Provider, MD  cetirizine (ZYRTEC) 10 MG tablet Take 10 mg by mouth daily. As needed for allergies    Historical Provider, MD  cyclobenzaprine (FLEXERIL) 10 MG tablet Take 1 tablet (10 mg total) by mouth at bedtime. Reported on 05/26/2015 05/26/15   Gabriel Cirri, NP  ferrous fumarate (HEMOCYTE - 106 MG FE) 325 (106 FE) MG TABS tablet Take 1 tablet by mouth. Reported on 06/07/2015    Historical Provider, MD  glucose blood test strip 1 each by Other route as needed for other. Use as instructed    Historical Provider, MD  insulin aspart (NOVOLOG) 100 UNIT/ML injection Inject into the skin 3 (three) times daily before meals. 100 unit/ml Per sliding scale:  90-150=15 units, 151-180=17 units, >180=20 units  Also takes Novolog before eating snacks    Historical Provider, MD  Insulin Glargine (LANTUS SOLOSTAR) 100 UNIT/ML Solostar Pen Inject 70 Units into the skin daily. In AM    Historical Provider, MD  metFORMIN (GLUCOPHAGE-XR) 500 MG 24 hr tablet Take 500 mg by mouth 3 (three) times daily.  Historical Provider, MD  metoCLOPramide (REGLAN) 10 MG tablet Take 10 mg by mouth daily.    Historical Provider, MD  metoprolol succinate (TOPROL-XL) 100 MG 24 hr tablet Take 1 tablet (100 mg total) by mouth daily. Take with or immediately following a meal. 05/24/15   Gabriel Cirri, NP  montelukast (SINGULAIR) 10 MG tablet Take 10 mg by mouth at bedtime. As needed    Historical Provider, MD  norethindrone (ORTHO MICRONOR) 0.35 MG tablet Take 1 tablet (0.35 mg total) by mouth daily.  08/04/15   Gabriel Cirri, NP  omeprazole (PRILOSEC) 20 MG capsule Take 20 mg by mouth daily.    Historical Provider, MD  ondansetron (ZOFRAN ODT) 4 MG disintegrating tablet Take 1 tablet (4 mg total) by mouth every 8 (eight) hours as needed for nausea or vomiting. 10/11/15   Particia Nearing, PA-C  spironolactone (ALDACTONE) 100 MG tablet Take 100 mg by mouth daily. 09/26/15   Historical Provider, MD  triamcinolone cream (KENALOG) 0.1 % Apply 1 application topically 2 (two) times daily. 08/04/15   Gabriel Cirri, NP    Allergies Bee venom; Ibuprofen; Morphine sulfate; Nsaids; Tomato; and Contrast media [iodinated diagnostic agents]  Family History  Problem Relation Age of Onset  . Diabetes Mother   . Diabetes Father   . Hypertension Father   . Heart disease Father   . Diabetes Sister   . Diabetes Brother   . Diabetes Maternal Grandmother   . Hypertension Maternal Grandmother   . Diabetes Maternal Grandfather   . Hypertension Maternal Grandfather   . Diabetes Paternal Grandmother   . Hypertension Paternal Grandmother   . Diabetes Paternal Grandfather   . Hypertension Paternal Grandfather     Social History Social History  Substance Use Topics  . Smoking status: Never Smoker  . Smokeless tobacco: Never Used  . Alcohol use 0.0 - 1.2 oz/week     Comment: on occasion    Review of Systems Constitutional: No fever/chills Eyes: No visual changes. ENT: No sore throat. Cardiovascular: Denies chest pain. Respiratory: Denies shortness of breath. Gastrointestinal: No abdominal pain.  No nausea, no vomiting.  No diarrhea.  No constipation. Genitourinary: Negative for dysuria. Musculoskeletal: Negative for back pain. Skin: Negative for rash. Neurological: weakness and dizziness  10-point ROS otherwise negative.  ____________________________________________   PHYSICAL EXAM:  VITAL SIGNS: ED Triage Vitals  Enc Vitals Group     BP 10/25/15 0510 133/89     Pulse Rate 10/25/15  0510 85     Resp 10/25/15 0510 18     Temp 10/25/15 0510 97.8 F (36.6 C)     Temp Source 10/25/15 0510 Oral     SpO2 10/25/15 0505 98 %     Weight 10/25/15 0510 185 lb (83.9 kg)     Height 10/25/15 0510 5\' 5"  (1.651 m)     Head Circumference --      Peak Flow --      Pain Score 10/25/15 0511 0     Pain Loc --      Pain Edu? --      Excl. in GC? --     Constitutional: Alert and oriented. Well appearing and in no acute distress. Eyes: Conjunctivae are normal. PERRL. EOMI. Head: Atraumatic. Nose: No congestion/rhinnorhea. Mouth/Throat: Mucous membranes are moist.  Oropharynx non-erythematous. Cardiovascular: Normal rate, regular rhythm. Grossly normal heart sounds.  Good peripheral circulation. Respiratory: Normal respiratory effort.  No retractions. Lungs CTAB. Gastrointestinal: Soft and nontender. No distention.  Musculoskeletal: No lower extremity  tenderness nor edema.   Neurologic:  Normal speech and language. Cranial nerves II through XII grossly intact with no focal motor or neuro deficits. The patient has weak effort. Skin:  Skin is warm, dry and intact.  Psychiatric: Mood and affect are normal.   ____________________________________________   LABS (all labs ordered are listed, but only abnormal results are displayed)  Labs Reviewed  BASIC METABOLIC PANEL - Abnormal; Notable for the following:       Result Value   Glucose, Bld 320 (*)    All other components within normal limits  URINALYSIS COMPLETEWITH MICROSCOPIC ([facility] ONLY) - Abnormal; Notable for the following:    Color, Urine STRAW (*)    APPearance CLEAR (*)    Glucose, UA >500 (*)    Ketones, ur TRACE (*)    Specific Gravity, Urine 1.036 (*)    Bacteria, UA RARE (*)    Squamous Epithelial / LPF 0-5 (*)    All other components within normal limits  CBC  TROPONIN I  PREGNANCY, URINE  CBG MONITORING, ED   ____________________________________________  EKG  ED ECG REPORT I, Kristen ApleyWebster,  Labib Cwynar P, the  attending physician, personally viewed and interpreted this ECG.   Date: 10/25/2015  EKG Time: 507  Rate: 88  Rhythm: normal sinus rhythm  Axis: normal  Intervals:none  ST&T Change: Flipped T waves in leads 3 and aVF with some mild elevation in V2, seen previously in July 2017  ____________________________________________  RADIOLOGY  CT head ____________________________________________   PROCEDURES  Procedure(s) performed: None  Procedures  Critical Care performed: No  ____________________________________________   INITIAL IMPRESSION / ASSESSMENT AND PLAN / ED COURSE  Pertinent labs & imaging results that were available during my care of the patient were reviewed by me and considered in my medical decision making (see chart for details).  This is a 25 year old female who comes into the hospital today with some weakness and nausea. The patient was dizzy and has had some minimal responsiveness. Mom denies any seizure activity and she reports that this is happening when she's been dehydrated in the past. The patient's blood sugar is in the 300s. I will send the patient presents CT scan as the remainder of her blood work is unremarkable. I did give the patient a liter of normal saline and she also received a 500 mL bolus of normal saline.  Clinical Course   The patient's CT scan is pending at this time. Her care was signed out to Dr. Roxan Hockeyobinson who will reassess the patient as well as find out the results of the CT scan.  ____________________________________________   FINAL CLINICAL IMPRESSION(S) / ED DIAGNOSES  Final diagnoses:  Dizziness  Weakness      NEW MEDICATIONS STARTED DURING THIS VISIT:  New Prescriptions   No medications on file     Note:  This document was prepared using Dragon voice recognition software and may include unintentional dictation errors.    Kristen ApleyAllison P Jaycob Mcclenton, MD 10/25/15 [phone]

## 2015-11-09 ENCOUNTER — Telehealth: Payer: Self-pay | Admitting: Unknown Physician Specialty

## 2015-11-09 NOTE — Telephone Encounter (Signed)
Mom called to cancel pt follow up appt.

## 2015-11-09 NOTE — Telephone Encounter (Signed)
pts mom called to change her follow up appt. The appointment was rescheduled.

## 2015-11-10 ENCOUNTER — Ambulatory Visit: Payer: BLUE CROSS/BLUE SHIELD | Admitting: Unknown Physician Specialty

## 2015-11-28 ENCOUNTER — Ambulatory Visit (INDEPENDENT_AMBULATORY_CARE_PROVIDER_SITE_OTHER): Payer: BLUE CROSS/BLUE SHIELD | Admitting: Unknown Physician Specialty

## 2015-11-28 ENCOUNTER — Encounter: Payer: Self-pay | Admitting: Unknown Physician Specialty

## 2015-11-28 DIAGNOSIS — L732 Hidradenitis suppurativa: Secondary | ICD-10-CM

## 2015-11-28 DIAGNOSIS — I10 Essential (primary) hypertension: Secondary | ICD-10-CM

## 2015-11-28 DIAGNOSIS — R55 Syncope and collapse: Secondary | ICD-10-CM | POA: Diagnosis not present

## 2015-11-28 MED ORDER — PROMETHAZINE HCL 25 MG PO TABS: 25.0000 mg | ORAL_TABLET | Freq: Three times a day (TID) | ORAL | 2 refills | Status: DC | PRN

## 2015-11-28 MED ORDER — METOPROLOL SUCCINATE ER 100 MG PO TB24: 50.0000 mg | ORAL_TABLET | Freq: Every day | ORAL | 0 refills | Status: DC

## 2015-11-28 NOTE — Assessment & Plan Note (Signed)
Right surgery completed.  Wounds healing.  One for pubic area is pending

## 2015-11-28 NOTE — Progress Notes (Signed)
BP 115/81 (BP Location: Left Arm, Patient Position: Sitting, Cuff Size: Normal)   Pulse 80   Temp 98.8 F (37.1 C)   Ht 5' 5.7" (1.669 m)   Wt 185 lb 6.4 oz (84.1 kg)   LMP 10/26/2015 (Exact Date)   SpO2 96%   BMI 30.20 kg/m    Subjective:    Patient ID: Kristen Tran, female    DOB: [birth year], 25 y.o.   MRN: [id number]  HPI: Kristen NorlanderMichaiah Jerusha Tran is a 25 y.o. female  Chief Complaint  Patient presents with  . passing out    pt states she has had a couple of fainting episodes, states one happened at her endocrinology appointment  . Wound Check    pt [facility] weeks [facility]  . Medication Refill    pt states she would like a new rx for phenergan, states she feels that this helps better than the odansetron   Syncope Pt states this has happened 5 times since June and she feels fine one minute and then gets super hot, dizzy, and blacks out usually less than a minute.  Pt states her blood sugar was low 2 of the times but the other 3 times her blood sugar was normal.  3 times her BP was low.  Pt states when she goes to the hospital she is told she is dehydrated.  Last Hgb A1c was 9.4 or 9.5.  Last time she was in the ER for this her blood sugar was 320 and it was felt due to dehydration, probably from high glucose but not DKA.  She states "my endocrinologist said the same thing."  ER note reviewed.  Last Endocrine note reviewed  She is concerned with Invokanna.   Nausea Pt would like to have Phenergan to help with nausea, manybe take it no more than once a day  She has not been driving for a period of time   Relevant past medical, surgical, family and social history reviewed and updated as indicated. Interim medical history since our last visit reviewed. Allergies and medications reviewed and updated.  Review of Systems  Per HPI unless specifically indicated above     Objective:    BP  115/81 (BP Location: Left Arm, Patient Position: Sitting, Cuff Size: Normal)   Pulse 80   Temp 98.8 F (37.1 C)   Ht 5' 5.7" (1.669 m)   Wt 185 lb 6.4 oz (84.1 kg)   LMP 10/26/2015 (Exact Date)   SpO2 96%   BMI 30.20 kg/m   Wt Readings from Last 3 Encounters:  11/28/15 185 lb 6.4 oz (84.1 kg)  10/25/15 185 lb (83.9 kg)  10/11/15 184 lb (83.5 kg)    Physical Exam  Constitutional: She is oriented to person, place, and time. She appears well-developed and well-nourished. No distress.  HENT:  Head: Normocephalic and atraumatic.  Eyes: Conjunctivae and lids are normal. Right eye exhibits no discharge. Left eye exhibits no discharge. No scleral icterus.  Neck: Normal range of motion. Neck supple. No JVD present. Carotid bruit is not present.  Cardiovascular: Normal rate, regular rhythm and normal heart sounds.   Pulmonary/Chest: Effort normal and breath sounds normal.  Abdominal: Normal appearance. There is no splenomegaly or hepatomegaly.  Musculoskeletal: Normal range of motion.  Neurological: She is alert and oriented to person, place, and time.  Skin: Skin is warm, dry and intact. No rash noted. No pallor.  Psychiatric:  She has a normal mood and affect. Her behavior is normal. Judgment and thought content normal.   Wounds were dressed and bandages dry  Results for orders placed or performed during the hospital encounter of 10/25/15  Basic metabolic panel  Result Value Ref Range   Sodium 135 135 - 145 mmol/L   Potassium 4.0 3.5 - 5.1 mmol/L   Chloride 101 101 - 111 mmol/L   CO2 25 22 - 32 mmol/L   Glucose, Bld 320 (H) 65 - 99 mg/dL   BUN 18 6 - 20 mg/dL   Creatinine, Ser 0.450.74 0.44 - 1.00 mg/dL   Calcium 9.2 8.9 - 40.910.3 mg/dL   GFR calc non Af Amer >60 >60 mL/min   GFR calc Af Amer >60 >60 mL/min   Anion gap 9 5 - 15  CBC  Result Value Ref Range   WBC 6.2 3.6 - 11.0 K/uL   RBC 5.20 3.80 - 5.20 MIL/uL   Hemoglobin 15.4 12.0 - 16.0 g/dL   HCT 81.144.4 91.435.0 - 78.247.0 %   MCV 85.5  80.0 - 100.0 fL   MCH 29.7 26.0 - 34.0 pg   MCHC 34.7 32.0 - 36.0 g/dL   RDW 95.612.5 21.311.5 - 08.614.5 %   Platelets 206 150 - 440 K/uL  Urinalysis complete, with microscopic  Result Value Ref Range   Color, Urine STRAW (A) YELLOW   APPearance CLEAR (A) CLEAR   Glucose, UA >500 (A) NEGATIVE mg/dL   Bilirubin Urine NEGATIVE NEGATIVE   Ketones, ur TRACE (A) NEGATIVE mg/dL   Specific Gravity, Urine 1.036 (H) 1.005 - 1.030   Hgb urine dipstick NEGATIVE NEGATIVE   pH 5.0 5.0 - 8.0   Protein, ur NEGATIVE NEGATIVE mg/dL   Nitrite NEGATIVE NEGATIVE   Leukocytes, UA NEGATIVE NEGATIVE   RBC / HPF 0-5 0 - 5 RBC/hpf   WBC, UA 0-5 0 - 5 WBC/hpf   Bacteria, UA RARE (A) NONE SEEN   Squamous Epithelial / LPF 0-5 (A) NONE SEEN  Troponin I  Result Value Ref Range   Troponin I <0.03 <0.03 ng/mL  Pregnancy, urine  Result Value Ref Range   Preg Test, Ur NEGATIVE NEGATIVE      Assessment & Plan:   Problem List Items Addressed This Visit      Unprioritized   Hydradenitis    Right surgery completed.  Wounds healing.  One for pubic area is pending      Hypertension    Good numbers today.  ? Hypotension at times.  Will cut Metoprolol from 100 to 50 mgs.        Relevant Medications   metoprolol succinate (TOPROL-XL) 100 MG 24 hr tablet   Syncope and collapse    I suspect syncope due to fluid shifts related to poorly controlled diabetes.  Will get a cardiology referral as she is high risk for cardiac complications.   She is changing drinking habits and will stop Gatorade and Powerade.        Relevant Medications   metoprolol succinate (TOPROL-XL) 100 MG 24 hr tablet       Follow up plan: Return in about 4 weeks (around 12/26/2015).

## 2015-11-28 NOTE — Assessment & Plan Note (Signed)
Good numbers today.  ? Hypotension at times.  Will cut Metoprolol from 100 to 50 mgs.

## 2015-11-28 NOTE — Assessment & Plan Note (Signed)
I suspect syncope due to fluid shifts related to poorly controlled diabetes.  Will get a cardiology referral as she is high risk for cardiac complications.   She is changing drinking habits and will stop Gatorade and Powerade.

## 2015-12-12 ENCOUNTER — Encounter: Payer: Self-pay | Admitting: Cardiology

## 2015-12-12 ENCOUNTER — Ambulatory Visit (INDEPENDENT_AMBULATORY_CARE_PROVIDER_SITE_OTHER): Payer: BLUE CROSS/BLUE SHIELD | Admitting: Cardiology

## 2015-12-12 VITALS — BP 138/91 | HR 89 | Ht 65.0 in | Wt 190.0 lb

## 2015-12-12 DIAGNOSIS — R55 Syncope and collapse: Secondary | ICD-10-CM | POA: Diagnosis not present

## 2015-12-12 DIAGNOSIS — I10 Essential (primary) hypertension: Secondary | ICD-10-CM

## 2015-12-12 NOTE — Progress Notes (Signed)
Cardiology Office Note   Date:  12/12/2015   ID:  Kristen Tran, DOB [birth year], MRN [id number]  Referring Doctor:  Gabriel Cirriheryl Wicker, NP   Cardiologist:   Almond LintAileen Jamichael Knotts, MD   Reason for consultation:  Chief Complaint  Patient presents with  . other     New Patient. Referred by Dr. Jamesetta OrleansWicker for syncope and collapse 11/28/15. Pt c/o sob and pain in bilteral feet for the past 2 weeks. Reviewed meds with pt verbally.      History of Present Illness: Kristen Tran is a 25 y.o. female who presents forEpisodes of passing out. Patient reports several episodes of passing out. Most if not all episodes happen in the sitting up or upright position. Prior to her losing consciousness, she would feel a flushed sensation, warm feeling all over, dizziness, vision turning dark. Episodes usually last less than a minute or so. The episodes were related to either high glucose levels, or some element of dehydration as noted in the ER. No precipitating chest pain or shortness of breath or palpitations.   ROS:  Please see the history of present illness. Aside from mentioned under HPI, all other systems are reviewed and negative.     Past Medical History:  Diagnosis Date  . Anemia   . Anxiety   . Asthma   . Atopic dermatitis   . Bronchopneumonia   . Depression   . Diabetes mellitus without complication (HCC)   . Gastroparesis   . GERD (gastroesophageal reflux disease)   . Hidradenitis suppurativa   . Hypertension   . Obesity     Past Surgical History:  Procedure Laterality Date  . CHOLECYSTECTOMY    . FRACTURE SURGERY    . HYDRADENITIS EXCISION     pt states she has a place removed from right underarm and replaced it with a skin graft from right leg  . WISDOM TOOTH EXTRACTION       reports that she has never smoked. She has never used smokeless tobacco. She reports that she drinks alcohol. She reports that she does not use drugs.   family history  includes Diabetes in her brother, father, maternal grandfather, maternal grandmother, mother, paternal grandfather, paternal grandmother, and sister; Heart disease in her father; Hypertension in her father, maternal grandfather, maternal grandmother, paternal grandfather, and paternal grandmother.   Outpatient Medications Prior to Visit  Medication Sig Dispense Refill  . albuterol (VENTOLIN HFA) 108 (90 BASE) MCG/ACT inhaler Inhale 2 puffs into the lungs every 6 (six) hours as needed for wheezing or shortness of breath.    . canagliflozin (INVOKANA) 100 MG TABS tablet Take 100 mg by mouth daily.    . cetirizine (ZYRTEC) 10 MG tablet Take 10 mg by mouth daily. As needed for allergies    . cyclobenzaprine (FLEXERIL) 10 MG tablet Take 1 tablet (10 mg total) by mouth at bedtime. Reported on 05/26/2015 90 tablet 1  . ferrous fumarate (HEMOCYTE - 106 MG FE) 325 (106 FE) MG TABS tablet Take 1 tablet by mouth. Reported on 06/07/2015    . glucose blood test strip 1 each by Other route as needed for other. Use as instructed    . insulin aspart (NOVOLOG) 100 UNIT/ML injection Inject into the skin 3 (three) times daily before meals. 100 unit/ml Per sliding scale:  90-150=15 units, 151-180=17 units, >180=20 units  Also takes Novolog before eating snacks    . Insulin Glargine (LANTUS SOLOSTAR) 100 UNIT/ML Solostar Pen Inject 70 Units into the skin  daily. In AM    . metFORMIN (GLUCOPHAGE-XR) 500 MG 24 hr tablet Take 500 mg by mouth 2 (two) times daily.     . metoCLOPramide (REGLAN) 10 MG tablet Take 10 mg by mouth daily.    . montelukast (SINGULAIR) 10 MG tablet Take 10 mg by mouth at bedtime. As needed    . norethindrone (ORTHO MICRONOR) 0.35 MG tablet Take 1 tablet (0.35 mg total) by mouth daily. 3 Package 4  . omeprazole (PRILOSEC) 20 MG capsule Take 20 mg by mouth daily.    . ondansetron (ZOFRAN ODT) 4 MG disintegrating tablet Take 1 tablet (4 mg total) by mouth every 8 (eight) hours as needed for nausea or  vomiting. 60 tablet 0  . promethazine (PHENERGAN) 25 MG tablet Take 1 tablet (25 mg total) by mouth every 8 (eight) hours as needed for nausea or vomiting. 30 tablet 2  . spironolactone (ALDACTONE) 100 MG tablet Take 100 mg by mouth daily.  0  . triamcinolone cream (KENALOG) 0.1 % Apply 1 application topically 2 (two) times daily. 30 g 12  . valACYclovir (VALTREX) 500 MG tablet Take 500 mg by mouth as needed.  1  . metoprolol succinate (TOPROL-XL) 100 MG 24 hr tablet Take 1 tablet (100 mg total) by mouth daily. Take with or immediately following a meal. (Patient not taking: Reported on 12/12/2015) 90 tablet 0   No facility-administered medications prior to visit.      Allergies: Bee venom; Ibuprofen; Morphine sulfate; Nsaids; Tomato; and Contrast media [iodinated diagnostic agents]    PHYSICAL EXAM: VS:  BP (!) 138/91 (BP Location: Left Arm, Patient Position: Sitting, Cuff Size: Normal)   Pulse 89   Ht 5\' 5"  (1.651 m)   Wt 190 lb (86.2 kg)   LMP 10/26/2015 (Exact Date)   BMI 31.62 kg/m  , Body mass index is 31.62 kg/m. Wt Readings from Last 3 Encounters:  12/12/15 190 lb (86.2 kg)  11/28/15 185 lb 6.4 oz (84.1 kg)  10/25/15 185 lb (83.9 kg)    Orthostatic VS for the past 24 hrs:  BP- Lying Pulse- Lying BP- Sitting Pulse- Sitting BP- Standing at 0 minutes Pulse- Standing at 0 minutes  12/12/15 1536 130/89 88 129/83 86 (!) 136/95 98      GENERAL:  well developed, well nourished, obese, not in acute distress HEENT: normocephalic, pink conjunctivae, anicteric sclerae, no xanthelasma, normal dentition, oropharynx clear NECK:  no neck vein engorgement, JVP normal, no hepatojugular reflux, carotid upstroke brisk and symmetric, no bruit, no thyromegaly, no lymphadenopathy LUNGS:  good respiratory effort, clear to auscultation bilaterally CV:  PMI not displaced, no thrills, no lifts, S1 and S2 within normal limits, no palpable S3 or S4, no murmurs, no rubs, no gallops ABD:  Soft,  nontender, nondistended, normoactive bowel sounds, no abdominal aortic bruit, no hepatomegaly, no splenomegaly MS: nontender back, no kyphosis, no scoliosis, no joint deformities EXT:  2+ DP/PT pulses, no edema, no varicosities, no cyanosis, no clubbing SKIN: warm, nondiaphoretic, normal turgor, no ulcers NEUROPSYCH: alert, oriented to person, place, and time, sensory/motor grossly intact, normal mood, appropriate affect  Recent Labs: 08/03/2015: ALT 12 10/25/2015: BUN 18; Creatinine, Ser 0.74; Hemoglobin 15.4; Platelets 206; Potassium 4.0; Sodium 135   Lipid Panel No results found for: CHOL, TRIG, HDL, CHOLHDL, VLDL, LDLCALC, LDLDIRECT   Other studies Reviewed:  EKG:  The ekg from 12/12/2015 was personally reviewed by me and it revealed sinus rhythm 89 BPM.  Additional studies/ records that were reviewed personally  reviewed by me today include: None available   ASSESSMENT AND PLAN: Episodes of loss of consciousness History is consistent with vasovagal/volume depletion or dehydration related EKG was unremarkable. Since last episode, patient has been hydrating with at least 60+ ounces of water, with the addition of low-calorie propel Recommend to check echocardiogram and follow-up when necessary if with recurrence  Hypertension Being monitored by PCP. Recommend medications, lifestyle changes including sodium restriction, increased physical activity due to weight loss.  Risk factors for CAD include hypertension, diabetes Recommend risk factor modification with blood pressure control, strict diabetes control, recommend to check lipid panel with LDL goal of less than 70 due to diabetes   Current medicines are reviewed at length with the patient today.  The patient does not have concerns regarding medicines.  Labs/ tests ordered today include:  Orders Placed This Encounter  Procedures  . EKG 12-Lead  Echo  I had a lengthy and detailed discussion with the patient regarding  diagnoses, prognosis, diagnostic options, treatment options , and side effects of medications.   I counseled the patient on importance of lifestyle modification including heart healthy diet, regular physical activity .   Disposition:   FU with undersigned after tests   I spent at least 60 minutes with the patient today and more than 50% of the time was spent counseling the patient and coordinating care.     Signed, Almond LintAileen Joydan Gretzinger, MD  12/12/2015 4:09 PM    [facility]  This note was generated in part with voice recognition software and I apologize for any typographical errors that were not detected and corrected.

## 2015-12-12 NOTE — Patient Instructions (Addendum)
Labwork: Please have lipid profile done at your primary care provider.   Testing/Procedures: Your physician has requested that you have an echocardiogram. Echocardiography is a painless test that uses sound waves to create images of your heart. It provides your doctor with information about the size and shape of your heart and how well your heart's chambers and valves are working. This procedure takes approximately one hour. There are no restrictions for this procedure.    Follow-Up: Your physician recommends that you schedule a follow-up appointment in: 2 months with Dr. Alvino ChapelIngal.    It was a pleasure seeing you today here in the office. Please do not hesitate to give us a call back if you have any further questions. [phone]  Farmers CellarPamela A. RN, BSN     Echocardiogram An echocardiogram, or echocardiography, uses sound waves (ultrasound) to produce an image of your heart. The echocardiogram is simple, painless, obtained within a short period of time, and offers valuable information to your health care provider. The images from an echocardiogram can provide information such as:  Evidence of coronary artery disease (CAD).  Heart size.  Heart muscle function.  Heart valve function.  Aneurysm detection.  Evidence of a past heart attack.  Fluid buildup around the heart.  Heart muscle thickening.  Assess heart valve function. Tell a health care provider about:  Any allergies you have.  All medicines you are taking, including vitamins, herbs, eye drops, creams, and over-the-counter medicines.  Any problems you or family members have had with anesthetic medicines.  Any blood disorders you have.  Any surgeries you have had.  Any medical conditions you have.  Whether you are pregnant or may be pregnant. What happens before the procedure? No special preparation is needed. Eat and drink normally. What happens during the procedure?  In order to produce an image of your heart,  gel will be applied to your chest and a wand-like tool (transducer) will be moved over your chest. The gel will help transmit the sound waves from the transducer. The sound waves will harmlessly bounce off your heart to allow the heart images to be captured in real-time motion. These images will then be recorded.  You may need an IV to receive a medicine that improves the quality of the pictures. What happens after the procedure? You may return to your normal schedule including diet, activities, and medicines, unless your health care provider tells you otherwise. This information is not intended to replace advice given to you by your health care provider. Make sure you discuss any questions you have with your health care provider. Document Released: 12/22/1999 Document Revised: 08/12/2015 Document Reviewed: 08/31/2012 Elsevier Interactive Patient Education  2017 [employer]

## 2015-12-13 ENCOUNTER — Encounter: Payer: Self-pay | Admitting: Cardiology

## 2015-12-13 ENCOUNTER — Telehealth: Payer: Self-pay | Admitting: Unknown Physician Specialty

## 2015-12-13 NOTE — Telephone Encounter (Signed)
Patient's mother returned my call. FMLA paperwork needs an appointment to be filled out so I scheduled the patient an appointment for 12/19/15.

## 2015-12-13 NOTE — Telephone Encounter (Signed)
Called and left a VM for the patient or her mother to please give me a call back.

## 2015-12-19 ENCOUNTER — Encounter: Payer: Self-pay | Admitting: Unknown Physician Specialty

## 2015-12-19 ENCOUNTER — Ambulatory Visit (INDEPENDENT_AMBULATORY_CARE_PROVIDER_SITE_OTHER): Payer: BLUE CROSS/BLUE SHIELD | Admitting: Unknown Physician Specialty

## 2015-12-19 DIAGNOSIS — R55 Syncope and collapse: Secondary | ICD-10-CM

## 2015-12-19 DIAGNOSIS — I10 Essential (primary) hypertension: Secondary | ICD-10-CM | POA: Diagnosis not present

## 2015-12-19 NOTE — Progress Notes (Signed)
BP 126/81 (BP Location: Left Arm, Patient Position: Sitting, Cuff Size: Normal)   Pulse 99   Temp 98.4 F (36.9 C)   Wt 187 lb 3.2 oz (84.9 kg)   LMP 10/26/2015 (Exact Date)   SpO2 96%   BMI 31.15 kg/m    Subjective:    Patient ID: Kristen Tran, female    DOB: 11/22/1990, 25 y.o.   MRN: [id number]  HPI: Kristen Tran is a 25 y.o. female  Chief Complaint  Patient presents with  . Paperwork    FMLA paperwork   Syncope No more syncopal events.  Drinking less sugar.  She is now drinking water and Propel.  She has seen the cardiologist and lost 3 pounds.  Reviewed cardiology notes and a lipid panel is recommended  Relevant past medical, surgical, family and social history reviewed and updated as indicated. Interim medical history since our last visit reviewed. Allergies and medications reviewed and updated.  Review of Systems  Per HPI unless specifically indicated above     Objective:    BP 126/81 (BP Location: Left Arm, Patient Position: Sitting, Cuff Size: Normal)   Pulse 99   Temp 98.4 F (36.9 C)   Wt 187 lb 3.2 oz (84.9 kg)   LMP 10/26/2015 (Exact Date)   SpO2 96%   BMI 31.15 kg/m   Wt Readings from Last 3 Encounters:  12/19/15 187 lb 3.2 oz (84.9 kg)  12/12/15 190 lb (86.2 kg)  11/28/15 185 lb 6.4 oz (84.1 kg)    Physical Exam  Constitutional: She is oriented to person, place, and time. She appears well-developed and well-nourished. No distress.  HENT:  Head: Normocephalic and atraumatic.  Eyes: Conjunctivae and lids are normal. Right eye exhibits no discharge. Left eye exhibits no discharge. No scleral icterus.  Neck: Normal range of motion. Neck supple. No JVD present. Carotid bruit is not present.  Cardiovascular: Normal rate, regular rhythm and normal heart sounds.   Pulmonary/Chest: Effort normal and breath sounds normal.  Abdominal: Normal appearance. There is no splenomegaly or hepatomegaly.  Musculoskeletal:  Normal range of motion.  Neurological: She is alert and oriented to person, place, and time.  Skin: Skin is warm, dry and intact. No rash noted. No pallor.  Psychiatric: She has a normal mood and affect. Her behavior is normal. Judgment and thought content normal.    Results for orders placed or performed during the hospital encounter of 10/25/15  Basic metabolic panel  Result Value Ref Range   Sodium 135 135 - 145 mmol/L   Potassium 4.0 3.5 - 5.1 mmol/L   Chloride 101 101 - 111 mmol/L   CO2 25 22 - 32 mmol/L   Glucose, Bld 320 (H) 65 - 99 mg/dL   BUN 18 6 - 20 mg/dL   Creatinine, Ser 4.090.74 0.44 - 1.00 mg/dL   Calcium 9.2 8.9 - 81.110.3 mg/dL   GFR calc non Af Amer >60 >60 mL/min   GFR calc Af Amer >60 >60 mL/min   Anion gap 9 5 - 15  CBC  Result Value Ref Range   WBC 6.2 3.6 - 11.0 K/uL   RBC 5.20 3.80 - 5.20 MIL/uL   Hemoglobin 15.4 12.0 - 16.0 g/dL   HCT 91.444.4 78.235.0 - 95.647.0 %   MCV 85.5 80.0 - 100.0 fL   MCH 29.7 26.0 - 34.0 pg   MCHC 34.7 32.0 - 36.0 g/dL   RDW 21.312.5 08.611.5 - 57.814.5 %   Platelets 206 150 - 440  K/uL  Urinalysis complete, with microscopic  Result Value Ref Range   Color, Urine STRAW (A) YELLOW   APPearance CLEAR (A) CLEAR   Glucose, UA >500 (A) NEGATIVE mg/dL   Bilirubin Urine NEGATIVE NEGATIVE   Ketones, ur TRACE (A) NEGATIVE mg/dL   Specific Gravity, Urine 1.036 (H) 1.005 - 1.030   Hgb urine dipstick NEGATIVE NEGATIVE   pH 5.0 5.0 - 8.0   Protein, ur NEGATIVE NEGATIVE mg/dL   Nitrite NEGATIVE NEGATIVE   Leukocytes, UA NEGATIVE NEGATIVE   RBC / HPF 0-5 0 - 5 RBC/hpf   WBC, UA 0-5 0 - 5 WBC/hpf   Bacteria, UA RARE (A) NONE SEEN   Squamous Epithelial / LPF 0-5 (A) NONE SEEN  Troponin I  Result Value Ref Range   Troponin I <0.03 <0.03 ng/mL  Pregnancy, urine  Result Value Ref Range   Preg Test, Ur NEGATIVE NEGATIVE              Assessment & Plan:   Problem List Items Addressed This Visit      Unprioritized   Hypertension    Monitor carefully as this  is improving      Relevant Orders   Comprehensive metabolic panel   Lipid Panel Piccolo, Waived   Syncope and collapse    This is improved.  Will continue present treatment.        Relevant Orders   Comprehensive metabolic panel   Lipid Panel Colbert Ewingiccolo, Waived      FMLA paperwork filled out  Follow up plan: Return in about 3 months (around 03/18/2016).

## 2015-12-19 NOTE — Assessment & Plan Note (Signed)
Monitor carefully as this is improving

## 2015-12-19 NOTE — Assessment & Plan Note (Signed)
This is improved.  Will continue present treatment.

## 2015-12-26 ENCOUNTER — Other Ambulatory Visit: Payer: BLUE CROSS/BLUE SHIELD

## 2015-12-26 ENCOUNTER — Ambulatory Visit: Payer: BLUE CROSS/BLUE SHIELD | Admitting: Unknown Physician Specialty

## 2015-12-26 DIAGNOSIS — I10 Essential (primary) hypertension: Secondary | ICD-10-CM

## 2015-12-26 DIAGNOSIS — R55 Syncope and collapse: Secondary | ICD-10-CM

## 2015-12-26 LAB — LIPID PANEL PICCOLO, WAIVED
Chol/HDL Ratio Piccolo,Waive: 6 mg/dL — ABNORMAL HIGH
Cholesterol Piccolo, Waived: 191 mg/dL (ref <200)
HDL Chol Piccolo, Waived: 32 mg/dL — ABNORMAL LOW (ref >59)
LDL Chol Calc Piccolo Waived: 134 mg/dL — ABNORMAL HIGH (ref <100)
Triglycerides Piccolo,Waived: 126 mg/dL (ref <150)
VLDL Chol Calc Piccolo,Waive: 25 mg/dL (ref <30)

## 2015-12-26 NOTE — Progress Notes (Signed)
Notified pt by mychart

## 2015-12-27 LAB — COMPREHENSIVE METABOLIC PANEL
ALT: 10 IU/L (ref 0–32)
AST: 8 IU/L (ref 0–40)
Albumin/Globulin Ratio: 2 (ref 1.2–2.2)
Albumin: 4.7 g/dL (ref 3.5–5.5)
Alkaline Phosphatase: 63 IU/L (ref 39–117)
BUN/Creatinine Ratio: 21 (ref 9–23)
BUN: 13 mg/dL (ref 6–20)
Bilirubin Total: 0.3 mg/dL (ref 0.0–1.2)
CO2: 22 mmol/L (ref 18–29)
Calcium: 9.6 mg/dL (ref 8.7–10.2)
Chloride: 100 mmol/L (ref 96–106)
Creatinine, Ser: 0.63 mg/dL (ref 0.57–1.00)
GFR calc Af Amer: 144 mL/min/{1.73_m2} (ref >59)
GFR calc non Af Amer: 125 mL/min/{1.73_m2} (ref >59)
Globulin, Total: 2.4 g/dL (ref 1.5–4.5)
Glucose: 210 mg/dL — ABNORMAL HIGH (ref 65–99)
Potassium: 4.4 mmol/L (ref 3.5–5.2)
Sodium: 140 mmol/L (ref 134–144)
Total Protein: 7.1 g/dL (ref 6.0–8.5)

## 2015-12-31 DIAGNOSIS — E119 Type 2 diabetes mellitus without complications: Secondary | ICD-10-CM | POA: Insufficient documentation

## 2015-12-31 DIAGNOSIS — J45909 Unspecified asthma, uncomplicated: Secondary | ICD-10-CM | POA: Diagnosis not present

## 2015-12-31 DIAGNOSIS — L509 Urticaria, unspecified: Secondary | ICD-10-CM | POA: Diagnosis not present

## 2015-12-31 DIAGNOSIS — Z79899 Other long term (current) drug therapy: Secondary | ICD-10-CM | POA: Diagnosis not present

## 2015-12-31 DIAGNOSIS — I10 Essential (primary) hypertension: Secondary | ICD-10-CM | POA: Insufficient documentation

## 2015-12-31 DIAGNOSIS — Z794 Long term (current) use of insulin: Secondary | ICD-10-CM | POA: Insufficient documentation

## 2015-12-31 DIAGNOSIS — T7840XA Allergy, unspecified, initial encounter: Secondary | ICD-10-CM | POA: Diagnosis present

## 2015-12-31 MED ORDER — PREDNISONE 20 MG PO TABS
ORAL_TABLET | ORAL | Status: AC
  Filled 2015-12-31: qty 3

## 2015-12-31 MED ORDER — DIPHENHYDRAMINE HCL 25 MG PO CAPS
50.0000 mg | ORAL_CAPSULE | Freq: Once | ORAL | Status: AC
  Administered 2015-12-31: 50 mg via ORAL

## 2015-12-31 MED ORDER — DIPHENHYDRAMINE HCL 25 MG PO CAPS
ORAL_CAPSULE | ORAL | Status: AC
  Filled 2015-12-31: qty 2

## 2015-12-31 MED ORDER — PREDNISONE 20 MG PO TABS
60.0000 mg | ORAL_TABLET | Freq: Once | ORAL | Status: AC
  Administered 2015-12-31: 60 mg via ORAL

## 2015-12-31 NOTE — ED Triage Notes (Signed)
Pt with hives noted to extremities, abd, chest wall since this am. Pt states last benadryl at 1100 today. Pt without resp distress.

## 2016-01-01 ENCOUNTER — Emergency Department
Admission: EM | Admit: 2016-01-01 | Discharge: 2016-01-01 | Disposition: A | Discharge status: Home / Self Care | Payer: BLUE CROSS/BLUE SHIELD | Attending: Emergency Medicine | Admitting: Emergency Medicine

## 2016-01-01 DIAGNOSIS — L509 Urticaria, unspecified: Secondary | ICD-10-CM

## 2016-01-01 MED ORDER — FAMOTIDINE 40 MG PO TABS: 40.0000 mg | ORAL_TABLET | Freq: Every evening | ORAL | 0 refills | Status: DC

## 2016-01-01 MED ORDER — DIPHENHYDRAMINE HCL 25 MG PO CAPS: 25.0000 mg | ORAL_CAPSULE | ORAL | 0 refills | Status: DC | PRN

## 2016-01-01 MED ORDER — DIPHENHYDRAMINE HCL 50 MG/ML IJ SOLN
25.0000 mg | Freq: Once | INTRAMUSCULAR | Status: AC
  Administered 2016-01-01: 25 mg via INTRAVENOUS
  Filled 2016-01-01: qty 1

## 2016-01-01 MED ORDER — FAMOTIDINE 20 MG PO TABS
40.0000 mg | ORAL_TABLET | Freq: Once | ORAL | Status: AC
  Administered 2016-01-01: 40 mg via ORAL
  Filled 2016-01-01: qty 2

## 2016-01-01 MED ORDER — PREDNISONE 20 MG PO TABS: 60.0000 mg | ORAL_TABLET | Freq: Every day | ORAL | 0 refills | Status: DC

## 2016-01-01 NOTE — ED Notes (Signed)
Discharge instructions reviewed with patient. Questions fielded by this RN. Patient verbalizes understanding of instructions. Patient discharged home in stable condition per Webster MD . No acute distress noted at time of discharge.   

## 2016-01-01 NOTE — ED Notes (Signed)
Pt asleep in lobby; easily awakened; no complaints; waiting patiently for treatment room

## 2016-01-01 NOTE — ED Notes (Signed)
Pt with hives noted to bilateral forearms, posterior upper back, lower abd and legs from ankles to mid thigh. Hives are improved from triage assessment. Pt states continued itching but is improved. resps unlabored, no angioedema noted.

## 2016-01-01 NOTE — ED Provider Notes (Signed)
[facility] Emergency Department Provider Note   ____________________________________________   First MD Initiated Contact with Patient 01/01/16 [phone]     (approximate)  I have reviewed the triage vital signs and the nursing notes.   HISTORY  Chief Complaint Allergic Reaction    HPI Kristen Tran is a 25 y.o. female who comes into the hospital today with hives. She reports that she has been breaking out in hives since yesterday morning. She reports that it had gotten so bad that he consumed her shoulders and her arm. Her foot was also red and swollen when she came in. The patient did not take anything for itching and hives before coming to the hospital. The patient was given Benadryl and prednisone here and she reports that the symptoms have improved. She reports that she still itching and the hives still hurt. The patient reports that she has had this before. It seems to happen every couple of years but she has no idea what causes it. The patient denies any new foods that may have caused the symptoms. She has seen an allergy specialist in the past and they could not give her reason for these symptoms. The patient is having no swelling or shortness of breath. She is here today for evaluation.   Past Medical History:  Diagnosis Date  . Anemia   . Anxiety   . Asthma   . Atopic dermatitis   . Bronchopneumonia   . Depression   . Diabetes mellitus without complication (HCC)   . Gastroparesis   . GERD (gastroesophageal reflux disease)   . Hidradenitis suppurativa   . Hypertension   . Obesity     Patient Active Problem List   Diagnosis Date Noted  . Syncope and collapse 11/28/2015  . Hydradenitis 06/07/2015  . Abscess of axilla, right 05/26/2015  . Insomnia 05/26/2015  . Vomiting 03/06/2015  . Cervicitis 03/06/2015  . Gastroenteritis 10/17/2014  . Metrorrhagia 10/17/2014  . Chronic LBP 08/24/2014  . Asthma 07/07/2014  . GERD  (gastroesophageal reflux disease) 07/07/2014  . Anemia 07/07/2014  . Paresthesia 07/07/2014  . Atopic dermatitis 07/07/2014  . Hyperlipidemia 07/07/2014  . Hypertension 07/07/2014  . Obesity 07/07/2014  . Acute anxiety 07/07/2014  . Eczema 07/07/2014  . Diabetic gastroparesis associated with type 2 diabetes mellitus (HCC) 10/12/2013  . Biliary calculi 04/22/2013  . Diabetes mellitus (HCC) 04/22/2013  . Allergic rhinitis, seasonal 09/09/2012    Past Surgical History:  Procedure Laterality Date  . CHOLECYSTECTOMY    . FRACTURE SURGERY    . HYDRADENITIS EXCISION     pt states she has a place removed from right underarm and replaced it with a skin graft from right leg  . WISDOM TOOTH EXTRACTION      Prior to Admission medications   Medication Sig Start Date End Date Taking? Authorizing Provider  albuterol (VENTOLIN HFA) 108 (90 BASE) MCG/ACT inhaler Inhale 2 puffs into the lungs every 6 (six) hours as needed for wheezing or shortness of breath.    Historical Provider, MD  canagliflozin (INVOKANA) 100 MG TABS tablet Take 100 mg by mouth daily. 04/12/15   Historical Provider, MD  cetirizine (ZYRTEC) 10 MG tablet Take 10 mg by mouth daily. As needed for allergies    Historical Provider, MD  cyclobenzaprine (FLEXERIL) 10 MG tablet Take 1 tablet (10 mg total) by mouth at bedtime. Reported on 05/26/2015 05/26/15   Gabriel Cirriheryl Wicker, NP  diphenhydrAMINE (BENADRYL) 25 mg capsule Take 1 capsule (25 mg total)  by mouth every 4 (four) hours as needed for itching. 01/01/16 12/31/16  Rebecka ApleyAllison P Webster, MD  famotidine (PEPCID) 40 MG tablet Take 1 tablet (40 mg total) by mouth every evening. 01/01/16 12/31/16  Rebecka ApleyAllison P Webster, MD  ferrous fumarate (HEMOCYTE - 106 MG FE) 325 (106 FE) MG TABS tablet Take 1 tablet by mouth. Reported on 06/07/2015    Historical Provider, MD  glucose blood test strip 1 each by Other route as needed for other. Use as instructed    Historical Provider, MD  insulin aspart (NOVOLOG)  100 UNIT/ML injection Inject into the skin 3 (three) times daily before meals. 100 unit/ml Per sliding scale:  90-150=15 units, 151-180=17 units, >180=20 units  Also takes Novolog before eating snacks    Historical Provider, MD  Insulin Glargine (LANTUS SOLOSTAR) 100 UNIT/ML Solostar Pen Inject 70 Units into the skin daily. In AM    Historical Provider, MD  metFORMIN (GLUCOPHAGE-XR) 500 MG 24 hr tablet Take 500 mg by mouth 2 (two) times daily.     Historical Provider, MD  metoCLOPramide (REGLAN) 10 MG tablet Take 10 mg by mouth daily.    Historical Provider, MD  metoprolol succinate (TOPROL-XL) 50 MG 24 hr tablet Take 50 mg by mouth daily. Take with or immediately following a meal.    Historical Provider, MD  montelukast (SINGULAIR) 10 MG tablet Take 10 mg by mouth at bedtime. As needed    Historical Provider, MD  norethindrone (ORTHO MICRONOR) 0.35 MG tablet Take 1 tablet (0.35 mg total) by mouth daily. 08/04/15   Gabriel Cirriheryl Wicker, NP  omeprazole (PRILOSEC) 20 MG capsule Take 20 mg by mouth daily.    Historical Provider, MD  ondansetron (ZOFRAN ODT) 4 MG disintegrating tablet Take 1 tablet (4 mg total) by mouth every 8 (eight) hours as needed for nausea or vomiting. 10/11/15   Particia Nearingachel Elizabeth Lane, PA-C  predniSONE (DELTASONE) 20 MG tablet Take 3 tablets (60 mg total) by mouth daily. 01/01/16   Rebecka ApleyAllison P Webster, MD  promethazine (PHENERGAN) 25 MG tablet Take 1 tablet (25 mg total) by mouth every 8 (eight) hours as needed for nausea or vomiting. 11/28/15   Gabriel Cirriheryl Wicker, NP  spironolactone (ALDACTONE) 100 MG tablet Take 100 mg by mouth daily. 09/26/15   Historical Provider, MD  triamcinolone cream (KENALOG) 0.1 % Apply 1 application topically 2 (two) times daily. 08/04/15   Gabriel Cirriheryl Wicker, NP  valACYclovir (VALTREX) 500 MG tablet Take 500 mg by mouth as needed. 11/25/15   Historical Provider, MD    Allergies Bee venom; Ibuprofen; Morphine sulfate; Nsaids; Tomato; and Contrast media [iodinated diagnostic  agents]  Family History  Problem Relation Age of Onset  . Diabetes Mother   . Diabetes Father   . Hypertension Father   . Heart disease Father   . Diabetes Sister   . Diabetes Brother   . Diabetes Maternal Grandmother   . Hypertension Maternal Grandmother   . Diabetes Maternal Grandfather   . Hypertension Maternal Grandfather   . Diabetes Paternal Grandmother   . Hypertension Paternal Grandmother   . Diabetes Paternal Grandfather   . Hypertension Paternal Grandfather     Social History Social History  Substance Use Topics  . Smoking status: Never Smoker  . Smokeless tobacco: Never Used  . Alcohol use 0.0 - 1.2 oz/week     Comment: on occasion    Review of Systems Constitutional: No fever/chills Eyes: No visual changes. ENT: No sore throat. Cardiovascular: Denies chest pain. Respiratory: Denies shortness of  breath. Gastrointestinal: No abdominal pain.  No nausea, no vomiting.  No diarrhea.  No constipation. Genitourinary: Negative for dysuria. Musculoskeletal: Negative for back pain. Skin: Hives Neurological: Negative for headaches, focal weakness or numbness.  10-point ROS otherwise negative.  ____________________________________________   PHYSICAL EXAM:  VITAL SIGNS: ED Triage Vitals  Enc Vitals Group     BP 12/31/15 2213 (!) 151/88     Pulse Rate 12/31/15 2213 97     Resp 12/31/15 2213 16     Temp 12/31/15 2213 98.6 F (37 C)     Temp Source 12/31/15 2213 Oral     SpO2 12/31/15 2213 100 %     Weight 12/31/15 2214 184 lb (83.5 kg)     Height 12/31/15 2214 5\' 5"  (1.651 m)     Head Circumference --      Peak Flow --      Pain Score 12/31/15 2214 5     Pain Loc --      Pain Edu? --      Excl. in GC? --     Constitutional: Alert and oriented. Well appearing and in Mild distress. Eyes: Conjunctivae are normal. PERRL. EOMI. Head: Atraumatic. Nose: No congestion/rhinnorhea. Mouth/Throat: Mucous membranes are moist.  Oropharynx  non-erythematous. Cardiovascular: Normal rate, regular rhythm. Grossly normal heart sounds.  Good peripheral circulation. Respiratory: Normal respiratory effort.  No retractions. Lungs CTAB. Gastrointestinal: Soft and nontender. No distention. Positive bowel sounds Musculoskeletal: No lower extremity tenderness nor edema.   Neurologic:  Normal speech and language. Skin:  Skin is warm, dry and intact. Hives to her arms laterally shoulders bilaterally and legs. Psychiatric: Mood and affect are normal.   ____________________________________________   LABS (all labs ordered are listed, but only abnormal results are displayed)  Labs Reviewed - No data to display ____________________________________________  EKG  none ____________________________________________  RADIOLOGY  none ____________________________________________   PROCEDURES  Procedure(s) performed: None  Procedures  Critical Care performed: No  ____________________________________________   INITIAL IMPRESSION / ASSESSMENT AND PLAN / ED COURSE  Pertinent labs & imaging results that were available during my care of the patient were reviewed by me and considered in my medical decision making (see chart for details).  This is a 25 year old female who comes into the hospital today with hives. They have improved significantly since receiving Benadryl and prednisone. I'll give the patient a second dose of Benadryl as it is been many hours since she received the first dose. She will be discharged home with some prednisone and Benadryl and Pepcid. The patient will receive a dose of Pepcid here as well. The patient otherwise has no other concerns and she'll be discharged home.  Clinical Course      ____________________________________________   FINAL CLINICAL IMPRESSION(S) / ED DIAGNOSES  Final diagnoses:  Hives      NEW MEDICATIONS STARTED DURING THIS VISIT:  New Prescriptions   DIPHENHYDRAMINE (BENADRYL)  25 MG CAPSULE    Take 1 capsule (25 mg total) by mouth every 4 (four) hours as needed for itching.   FAMOTIDINE (PEPCID) 40 MG TABLET    Take 1 tablet (40 mg total) by mouth every evening.   PREDNISONE (DELTASONE) 20 MG TABLET    Take 3 tablets (60 mg total) by mouth daily.     Note:  This document was prepared using Dragon voice recognition software and may include unintentional dictation errors.    Rebecka Apley, MD 01/01/16 0430

## 2016-01-03 ENCOUNTER — Ambulatory Visit (INDEPENDENT_AMBULATORY_CARE_PROVIDER_SITE_OTHER): Payer: BLUE CROSS/BLUE SHIELD

## 2016-01-03 ENCOUNTER — Other Ambulatory Visit: Payer: Self-pay

## 2016-01-03 DIAGNOSIS — R55 Syncope and collapse: Secondary | ICD-10-CM

## 2016-01-03 DIAGNOSIS — I10 Essential (primary) hypertension: Secondary | ICD-10-CM | POA: Diagnosis not present

## 2016-02-06 ENCOUNTER — Ambulatory Visit (INDEPENDENT_AMBULATORY_CARE_PROVIDER_SITE_OTHER): Payer: BLUE CROSS/BLUE SHIELD | Admitting: Unknown Physician Specialty

## 2016-02-06 ENCOUNTER — Encounter: Payer: Self-pay | Admitting: Unknown Physician Specialty

## 2016-02-06 DIAGNOSIS — E7800 Pure hypercholesterolemia, unspecified: Secondary | ICD-10-CM | POA: Diagnosis not present

## 2016-02-06 DIAGNOSIS — L509 Urticaria, unspecified: Secondary | ICD-10-CM | POA: Diagnosis not present

## 2016-02-06 DIAGNOSIS — E1165 Type 2 diabetes mellitus with hyperglycemia: Secondary | ICD-10-CM | POA: Diagnosis not present

## 2016-02-06 DIAGNOSIS — I10 Essential (primary) hypertension: Secondary | ICD-10-CM

## 2016-02-06 DIAGNOSIS — Z794 Long term (current) use of insulin: Secondary | ICD-10-CM | POA: Diagnosis not present

## 2016-02-06 NOTE — Assessment & Plan Note (Signed)
Seems to be improved with diet  Per Endocrine

## 2016-02-06 NOTE — Assessment & Plan Note (Signed)
High LDL.  Sugar stabilized.  Recheck in 3 months fasting

## 2016-02-06 NOTE — Assessment & Plan Note (Signed)
Stable, continue present medications.   

## 2016-02-06 NOTE — Progress Notes (Signed)
BP 118/78 (BP Location: Left Arm, Patient Position: Sitting, Cuff Size: Large)   Pulse 87   Temp 98.5 F (36.9 C)   Ht 5' 5.1" (1.654 m)   Wt 190 lb 3.2 oz (86.3 kg)   LMP 01/30/2016 (Exact Date)   SpO2 95%   BMI 31.55 kg/m    Subjective:    Patient ID: Kristen Tran, female    DOB: 05-10-1990, 26 y.o.   MRN: [id number]  HPI: Kristen Tran is a 26 y.o. female  Chief Complaint  Patient presents with  . Diabetes    pt states she is due for eye exam  . Hypertension   Diabetes Per Endocrine.  Sugars are stable  Hypertension Using medications without difficulty Average home BPs Not checking   No problems or lightheadedness No chest pain with exertion or shortness of breath No Edema  Urticaria Went to the ER.    Hyperlipidemia LDL is 134 which is high for this high risk patient.    Reviewed ER and Endocrine notes  Relevant past medical, surgical, family and social history reviewed and updated as indicated. Interim medical history since our last visit reviewed. Allergies and medications reviewed and updated.  Review of Systems  Per HPI unless specifically indicated above     Objective:    BP 118/78 (BP Location: Left Arm, Patient Position: Sitting, Cuff Size: Large)   Pulse 87   Temp 98.5 F (36.9 C)   Ht 5' 5.1" (1.654 m)   Wt 190 lb 3.2 oz (86.3 kg)   LMP 01/30/2016 (Exact Date)   SpO2 95%   BMI 31.55 kg/m   Wt Readings from Last 3 Encounters:  02/06/16 190 lb 3.2 oz (86.3 kg)  12/31/15 184 lb (83.5 kg)  12/19/15 187 lb 3.2 oz (84.9 kg)    Physical Exam  Constitutional: She is oriented to person, place, and time. She appears well-developed and well-nourished. No distress.  HENT:  Head: Normocephalic and atraumatic.  Eyes: Conjunctivae and lids are normal. Right eye exhibits no discharge. Left eye exhibits no discharge. No scleral icterus.  Neck: Normal range of motion. Neck supple. No JVD present. Carotid bruit  is not present.  Cardiovascular: Normal rate, regular rhythm and normal heart sounds.   Pulmonary/Chest: Effort normal and breath sounds normal.  Abdominal: Normal appearance. There is no splenomegaly or hepatomegaly.  Musculoskeletal: Normal range of motion.  Neurological: She is alert and oriented to person, place, and time.  Skin: Skin is warm, dry and intact. No rash noted. No pallor.  Psychiatric: She has a normal mood and affect. Her behavior is normal. Judgment and thought content normal.    Results for orders placed or performed in visit on 12/26/15  Comprehensive metabolic panel  Result Value Ref Range   Glucose 210 (H) 65 - 99 mg/dL   BUN 13 6 - 20 mg/dL   Creatinine, Ser 4.090.63 0.57 - 1.00 mg/dL   GFR calc non Af Amer 125 >59 mL/min/1.73   GFR calc Af Amer 144 >59 mL/min/1.73   BUN/Creatinine Ratio 21 9 - 23   Sodium 140 134 - 144 mmol/L   Potassium 4.4 3.5 - 5.2 mmol/L   Chloride 100 96 - 106 mmol/L   CO2 22 18 - 29 mmol/L   Calcium 9.6 8.7 - 10.2 mg/dL   Total Protein 7.1 6.0 - 8.5 g/dL   Albumin 4.7 3.5 - 5.5 g/dL   Globulin, Total 2.4 1.5 - 4.5 g/dL   Albumin/Globulin Ratio 2.0  1.2 - 2.2   Bilirubin Total 0.3 0.0 - 1.2 mg/dL   Alkaline Phosphatase 63 39 - 117 IU/L   AST 8 0 - 40 IU/L   ALT 10 0 - 32 IU/L  Lipid Panel Piccolo, Waived  Result Value Ref Range   Cholesterol Piccolo, Waived 191 <200 mg/dL   HDL Chol Piccolo, Waived 32 (L) >59 mg/dL   Triglycerides Piccolo,Waived 126 <150 mg/dL   Chol/HDL Ratio Piccolo,Waive 6.0 (H) mg/dL   LDL Chol Calc Piccolo Waived 134 (H) <100 mg/dL   VLDL Chol Calc Piccolo,Waive 25 <30 mg/dL      Assessment & Plan:   Problem List Items Addressed This Visit      Unprioritized   Diabetes mellitus (HCC)    Seems to be improved with diet  Per Endocrine      Hyperlipidemia    High LDL.  Sugar stabilized.  Recheck in 3 months fasting      Hypertension    Stable, continue present medications. \      Urticaria     Recent ER visit due to "wool."  Multiple allergies.  Has epi-pen          Follow up plan: Return in about 3 months (around 05/06/2016).

## 2016-02-06 NOTE — Assessment & Plan Note (Signed)
Recent ER visit due to "wool."  Multiple allergies.  Has epi-pen

## 2016-02-12 ENCOUNTER — Telehealth: Payer: Self-pay | Admitting: Cardiology

## 2016-02-12 NOTE — Telephone Encounter (Signed)
Pt called to cancel Feb 6 f/u appt w/Dr. Alvino ChapelIngal.  States she has no symptoms, feels, better, and was unaware of OV until today.   She does not want to reschedule at this time.  December echo showed normal LVEF.  Advised pt to continue to monitor and call if she has any concerns.  Will route to MD to make aware.

## 2016-02-12 NOTE — Telephone Encounter (Signed)
Patient wants to confirm tomorrows appointment for fu ech results is needed.  Patient said she was given results and they were good.  Please confirm with patient yes or no if needed.

## 2016-02-13 ENCOUNTER — Ambulatory Visit: Payer: BLUE CROSS/BLUE SHIELD | Admitting: Cardiology

## 2016-03-26 ENCOUNTER — Ambulatory Visit: Payer: BLUE CROSS/BLUE SHIELD | Admitting: Unknown Physician Specialty

## 2016-03-28 ENCOUNTER — Ambulatory Visit (INDEPENDENT_AMBULATORY_CARE_PROVIDER_SITE_OTHER): Payer: BLUE CROSS/BLUE SHIELD | Admitting: Family Medicine

## 2016-03-28 ENCOUNTER — Encounter: Payer: Self-pay | Admitting: Family Medicine

## 2016-03-28 VITALS — BP 122/85 | HR 91 | Temp 98.9°F | Wt 197.4 lb

## 2016-03-28 DIAGNOSIS — J111 Influenza due to unidentified influenza virus with other respiratory manifestations: Secondary | ICD-10-CM

## 2016-03-28 DIAGNOSIS — R6883 Chills (without fever): Secondary | ICD-10-CM | POA: Diagnosis not present

## 2016-03-28 LAB — VERITOR FLU A/B WAIVED
Influenza A: NEGATIVE
Influenza B: NEGATIVE

## 2016-03-28 MED ORDER — OSELTAMIVIR PHOSPHATE 75 MG PO CAPS: 75.0000 mg | ORAL_CAPSULE | Freq: Two times a day (BID) | ORAL | 0 refills | Status: DC

## 2016-03-28 NOTE — Patient Instructions (Signed)
Follow up if no improvement 

## 2016-03-28 NOTE — Progress Notes (Signed)
BP 122/85 (BP Location: Left Arm, Patient Position: Sitting, Cuff Size: Large)   Pulse 91   Temp 98.9 F (37.2 C)   Wt 197 lb 6.4 oz (89.5 kg)   LMP 03/28/2015 (Exact Date)   SpO2 98%   BMI 32.75 kg/m    Subjective:    Patient ID: Kristen Tran, female    DOB: 1990/03/24, 26 y.o.   MRN: [id number]  HPI: Kristen Tran is a 26 y.o. female  Chief Complaint  Patient presents with  . URI    pt states she has been having chest congestion, chills, body aches, headache, and drainage. states most symptoms started yesterday morning.    Sudden onset sweats, chills, body aches, HA, congestion, cough, and sneezing since yesterday. Some nausea, no appetite, denies vomiting or diarrhea. Taking tylenol cold and flu with minimal relief. Sibling was recently sick.   Past Medical History:  Diagnosis Date  . Anemia   . Anxiety   . Asthma   . Atopic dermatitis   . Bronchopneumonia   . Depression   . Diabetes mellitus without complication (HCC)   . Gastroparesis   . GERD (gastroesophageal reflux disease)   . Hidradenitis suppurativa   . Hypertension   . Obesity    Social History   Social History  . Marital status: Single    Spouse name: N/A  . Number of children: N/A  . Years of education: N/A   Occupational History  . Not on file.   Social History Main Topics  . Smoking status: Never Smoker  . Smokeless tobacco: Never Used  . Alcohol use 0.0 - 1.2 oz/week     Comment: on occasion  . Drug use: No  . Sexual activity: Not on file   Other Topics Concern  . Not on file   Social History Narrative  . No narrative on file    Relevant past medical, surgical, family and social history reviewed and updated as indicated. Interim medical history since our last visit reviewed. Allergies and medications reviewed and updated.  Review of Systems  Constitutional: Positive for appetite change, chills and diaphoresis.  HENT: Positive for congestion  and sneezing.   Eyes: Negative.   Respiratory: Positive for cough. Negative for shortness of breath and wheezing.   Cardiovascular: Negative.   Gastrointestinal: Positive for nausea.  Genitourinary: Negative.   Musculoskeletal: Positive for myalgias.  Neurological: Positive for headaches.  Psychiatric/Behavioral: Negative.     Per HPI unless specifically indicated above     Objective:    BP 122/85 (BP Location: Left Arm, Patient Position: Sitting, Cuff Size: Large)   Pulse 91   Temp 98.9 F (37.2 C)   Wt 197 lb 6.4 oz (89.5 kg)   LMP 03/28/2015 (Exact Date)   SpO2 98%   BMI 32.75 kg/m   Wt Readings from Last 3 Encounters:  03/28/16 197 lb 6.4 oz (89.5 kg)  02/06/16 190 lb 3.2 oz (86.3 kg)  12/31/15 184 lb (83.5 kg)    Physical Exam  Constitutional: She is oriented to person, place, and time. She appears well-developed and well-nourished. No distress.  HENT:  Head: Atraumatic.  Right Ear: External ear normal.  Left Ear: External ear normal.  Nasal mucosa boggy and erythematous Oropharynx erythematous  Eyes: Conjunctivae are normal. Pupils are equal, round, and reactive to light.  Neck: Normal range of motion. Neck supple.  Cardiovascular: Normal rate and normal heart sounds.   Pulmonary/Chest: Effort normal and breath sounds normal. No respiratory distress.  Musculoskeletal: Normal range of motion.  Lymphadenopathy:    She has no cervical adenopathy.  Neurological: She is alert and oriented to person, place, and time.  Skin: Skin is warm and dry.  Psychiatric: She has a normal mood and affect. Her behavior is normal.  Nursing note and vitals reviewed.     Assessment & Plan:   Problem List Items Addressed This Visit    None    Visit Diagnoses    Influenza    -  Primary   Rapid flu neg, but classic sxs. Will start tamiflu. Discussed supportive care, continue OTC cold and flu products prn. F/u if no improvement   Relevant Medications   oseltamivir (TAMIFLU) 75  MG capsule   Other Relevant Orders   Veritor Flu A/B Waived   Chills       Relevant Orders   Veritor Flu A/B Waived       Follow up plan: Return if symptoms worsen or fail to improve.

## 2016-05-07 ENCOUNTER — Encounter: Payer: Self-pay | Admitting: Unknown Physician Specialty

## 2016-05-07 ENCOUNTER — Ambulatory Visit (INDEPENDENT_AMBULATORY_CARE_PROVIDER_SITE_OTHER): Payer: BLUE CROSS/BLUE SHIELD | Admitting: Unknown Physician Specialty

## 2016-05-07 VITALS — BP 141/96 | HR 73 | Temp 98.9°F | Ht 64.8 in | Wt 200.7 lb

## 2016-05-07 DIAGNOSIS — N921 Excessive and frequent menstruation with irregular cycle: Secondary | ICD-10-CM | POA: Diagnosis not present

## 2016-05-07 DIAGNOSIS — E7800 Pure hypercholesterolemia, unspecified: Secondary | ICD-10-CM | POA: Diagnosis not present

## 2016-05-07 DIAGNOSIS — L309 Dermatitis, unspecified: Secondary | ICD-10-CM

## 2016-05-07 DIAGNOSIS — E1165 Type 2 diabetes mellitus with hyperglycemia: Secondary | ICD-10-CM | POA: Diagnosis not present

## 2016-05-07 DIAGNOSIS — I10 Essential (primary) hypertension: Secondary | ICD-10-CM

## 2016-05-07 DIAGNOSIS — Z794 Long term (current) use of insulin: Secondary | ICD-10-CM

## 2016-05-07 MED ORDER — METOPROLOL SUCCINATE ER 50 MG PO TB24: 50.0000 mg | ORAL_TABLET | Freq: Every day | ORAL | 1 refills | Status: DC

## 2016-05-07 MED ORDER — TRIAMCINOLONE ACETONIDE 0.1 % EX CREA: 1.0000 "application " | TOPICAL_CREAM | Freq: Two times a day (BID) | CUTANEOUS | 12 refills | Status: DC

## 2016-05-07 NOTE — Assessment & Plan Note (Addendum)
Per [facility] endocrine.  Labs reviewed.  Chemistry readings up to date

## 2016-05-07 NOTE — Assessment & Plan Note (Signed)
Check BC

## 2016-05-07 NOTE — Assessment & Plan Note (Signed)
BP high but out of Metoprolol.  Will restart

## 2016-05-07 NOTE — Assessment & Plan Note (Signed)
Check cholesterol.

## 2016-05-07 NOTE — Assessment & Plan Note (Signed)
Needs refill of Triamcinolone.

## 2016-05-07 NOTE — Progress Notes (Signed)
BP (!) 141/96   Pulse 73   Temp 98.9 F (37.2 C)   Ht 5' 4.8" (1.646 m)   Wt 200 lb 11.2 oz (91 kg)   LMP 05/05/2016 (Exact Date)   SpO2 99%   BMI 33.60 kg/m    Subjective:    Patient ID: Kristen Tran, female    DOB: 1990-04-03, 26 y.o.   MRN: [id number]  HPI: Kristen Tran is a 26 y.o. female  Chief Complaint  Patient presents with  . Diabetes    pt states she knows she is due for eye exam  . Hyperlipidemia    pt states that she is fasting today  . Hypertension    pt states BP is up a little because she has been out of her metoprolol for about a week, states she has to pick it up from the pharmacy  . Medication Refill    pt states that she needs a refill on triamcinolone cream    Diabetes Seeing [facility] endocrinology.  Having some tingling first and second toe  Hypertension Using medications without difficulty except out of Metoprolol today Average home BPs Not checking  No problems or lightheadedness No chest pain with exertion or shortness of breath No Edema   Hyperlipidemia Using medications without problems: No Muscle aches  Diet compliance: She feels she is eating better and doing what she is "supposed to."  Completely cut out sugar drinks and sugar.  Eating mostly fruits and vegetables.   Exercise: Joined a gym recently and planning to work out with a friend.   Eczema Various rashes noted    History of anemia Menses are better.  Noted no CBC done for over a year.  History of anemia  Past Medical History:  Diagnosis Date  . Anemia   . Anxiety   . Asthma   . Atopic dermatitis   . Bronchopneumonia   . Depression   . Diabetes mellitus without complication (HCC)   . Gastroparesis   . GERD (gastroesophageal reflux disease)   . Hidradenitis suppurativa   . Hypertension   . Obesity    Family History  Problem Relation Age of Onset  . Diabetes Mother   . Diabetes Father   . Hypertension Father   . Heart disease  Father   . Diabetes Sister   . Diabetes Brother   . Diabetes Maternal Grandmother   . Hypertension Maternal Grandmother   . Diabetes Maternal Grandfather   . Hypertension Maternal Grandfather   . Diabetes Paternal Grandmother   . Hypertension Paternal Grandmother   . Diabetes Paternal Grandfather   . Hypertension Paternal Grandfather      Relevant past medical, surgical, family and social history reviewed and updated as indicated. Interim medical history since our last visit reviewed. Allergies and medications reviewed and updated.  Review of Systems  Constitutional: Negative.   HENT: Negative.   Respiratory: Negative.   Cardiovascular: Negative.   Neurological:       Mild numbness large and second toes    Per HPI unless specifically indicated above     Objective:    BP (!) 141/96   Pulse 73   Temp 98.9 F (37.2 C)   Ht 5' 4.8" (1.646 m)   Wt 200 lb 11.2 oz (91 kg)   LMP 05/05/2016 (Exact Date)   SpO2 99%   BMI 33.60 kg/m   Wt Readings from Last 3 Encounters:  05/07/16 200 lb 11.2 oz (91 kg)  03/28/16 197 lb 6.4  oz (89.5 kg)  02/06/16 190 lb 3.2 oz (86.3 kg)    Physical Exam  Constitutional: She is oriented to person, place, and time. She appears well-developed and well-nourished. No distress.  HENT:  Head: Normocephalic and atraumatic.  Eyes: Conjunctivae and lids are normal. Right eye exhibits no discharge. Left eye exhibits no discharge. No scleral icterus.  Neck: Normal range of motion. Neck supple. No JVD present. Carotid bruit is not present.  Cardiovascular: Normal rate, regular rhythm and normal heart sounds.   Pulmonary/Chest: Effort normal and breath sounds normal.  Abdominal: Normal appearance. There is no splenomegaly or hepatomegaly.  Musculoskeletal: Normal range of motion.  Neurological: She is alert and oriented to person, place, and time.  Skin: Skin is warm, dry and intact. No rash noted. No pallor.  Patches of dry scaley skin  Psychiatric:  She has a normal mood and affect. Her behavior is normal. Judgment and thought content normal.    Results for orders placed or performed in visit on 03/28/16  Veritor Flu A/B Waived  Result Value Ref Range   Influenza A Negative Negative   Influenza B Negative Negative      Assessment & Plan:   Problem List Items Addressed This Visit      Unprioritized   Diabetes mellitus (HCC)    Per [facility].  Labs reviewed.  Chemistry readings up to date      Relevant Medications   insulin regular (NOVOLIN R,HUMULIN R) 250 units/2.37mL (100 units/mL) injection   Eczema    Needs refill of Triamcinolone.        Hyperlipidemia    Check cholesterol      Relevant Medications   metoprolol succinate (TOPROL-XL) 50 MG 24 hr tablet   Other Relevant Orders   Lipid Panel w/o Chol/HDL Ratio   Hypertension    BP high but out of Metoprolol.  Will restart      Relevant Medications   metoprolol succinate (TOPROL-XL) 50 MG 24 hr tablet   Metrorrhagia - Primary    Check BC      Relevant Orders   CBC with Differential/Platelet       Follow up plan: Return in about 6 months (around 11/07/2016) for physical.

## 2016-05-08 LAB — CBC WITH DIFFERENTIAL/PLATELET

## 2016-05-08 LAB — LIPID PANEL W/O CHOL/HDL RATIO
Cholesterol, Total: 159 mg/dL (ref 100–199)
HDL: 31 mg/dL — ABNORMAL LOW (ref >39)
LDL Calculated: 116 mg/dL — ABNORMAL HIGH (ref 0–99)
Triglycerides: 62 mg/dL (ref 0–149)
VLDL Cholesterol Cal: 12 mg/dL (ref 5–40)

## 2016-05-08 NOTE — Progress Notes (Signed)
Notified pt by mychart

## 2016-06-28 ENCOUNTER — Other Ambulatory Visit: Payer: Self-pay | Admitting: Unknown Physician Specialty

## 2016-06-28 MED ORDER — CYCLOBENZAPRINE HCL 10 MG PO TABS: 10.0000 mg | ORAL_TABLET | Freq: Every day | ORAL | 1 refills | Status: DC

## 2016-06-28 NOTE — Telephone Encounter (Signed)
Pt would like refill for cyclobenzaprine (FLEXERIL) 10 MG tablet set to [facility] [address]

## 2016-06-28 NOTE — Telephone Encounter (Signed)
Routing to provider  

## 2016-07-26 ENCOUNTER — Encounter: Payer: Self-pay | Admitting: Unknown Physician Specialty

## 2016-07-26 ENCOUNTER — Ambulatory Visit (INDEPENDENT_AMBULATORY_CARE_PROVIDER_SITE_OTHER): Payer: BLUE CROSS/BLUE SHIELD | Admitting: Unknown Physician Specialty

## 2016-07-26 DIAGNOSIS — M25472 Effusion, left ankle: Secondary | ICD-10-CM | POA: Diagnosis not present

## 2016-07-26 DIAGNOSIS — M25471 Effusion, right ankle: Secondary | ICD-10-CM | POA: Diagnosis not present

## 2016-07-26 DIAGNOSIS — K3184 Gastroparesis: Secondary | ICD-10-CM | POA: Diagnosis not present

## 2016-07-26 MED ORDER — METOCLOPRAMIDE HCL 10 MG PO TABS: 10.0000 mg | ORAL_TABLET | Freq: Every day | ORAL | 12 refills | Status: DC | PRN

## 2016-07-26 NOTE — Assessment & Plan Note (Signed)
Suspect related to insulin and hot weather.  Cardiology w/u was normal

## 2016-07-26 NOTE — Assessment & Plan Note (Signed)
Refill Reglan and take on a regular basis until improving.  Stressed good control of blood sugar

## 2016-07-26 NOTE — Progress Notes (Signed)
BP (!) 144/94 (BP Location: Left Arm, Cuff Size: Normal)   Pulse 78   Temp 98.7 F (37.1 C)   Wt 193 lb 12.8 oz (87.9 kg)   LMP 07/10/2016 (Exact Date)   SpO2 98%   BMI 32.45 kg/m    Subjective:    Patient ID: Kristen Tran, female    DOB: Oct 18, 1990, 26 y.o.   MRN: [id number]  HPI: Kristen Tran is a 26 y.o. female  Chief Complaint  Patient presents with  . Edema    pt states that she started noticing swelling in her a feet a few months ago, but recently she has noticed swelling in her feet and legs  . Stomach Issue    pt states for the last couple of days she has not ate much and has had a full feeling. Pt stated that she ate a hotdog the other day and that is all she states she had that day    Foot swelling Pt states her ankles are swelling.  Noting it daily.  Increasing exercise and walking 1-2 miles a day without SOB.    Gastroperesis Pt states she is having a "full feeling" when she eats.  States she ate a hot dog and felt full and bloated all day.  She has been on Reglan just a prn  Blood sugar under fair control but Hgb A1C still 9.2. Taking 50 units of Lantus and SS insulin when eating.     Relevant past medical, surgical, family and social history reviewed and updated as indicated. Interim medical history since our last visit reviewed. Allergies and medications reviewed and updated.  Review of Systems  Per HPI unless specifically indicated above     Objective:    BP (!) 144/94 (BP Location: Left Arm, Cuff Size: Normal)   Pulse 78   Temp 98.7 F (37.1 C)   Wt 193 lb 12.8 oz (87.9 kg)   LMP 07/10/2016 (Exact Date)   SpO2 98%   BMI 32.45 kg/m   Wt Readings from Last 3 Encounters:  07/26/16 193 lb 12.8 oz (87.9 kg)  05/07/16 200 lb 11.2 oz (91 kg)  03/28/16 197 lb 6.4 oz (89.5 kg)    Physical Exam  Constitutional: She is oriented to person, place, and time. She appears well-developed and well-nourished. No  distress.  HENT:  Head: Normocephalic and atraumatic.  Eyes: Conjunctivae and lids are normal. Right eye exhibits no discharge. Left eye exhibits no discharge. No scleral icterus.  Neck: Normal range of motion. Neck supple. No JVD present. Carotid bruit is not present.  Cardiovascular: Normal rate, regular rhythm and normal heart sounds.   Pulmonary/Chest: Effort normal and breath sounds normal.  Abdominal: Normal appearance. There is no splenomegaly or hepatomegaly.  Musculoskeletal: Normal range of motion.  Neurological: She is alert and oriented to person, place, and time.  Skin: Skin is warm, dry and intact. No rash noted. No pallor.  Psychiatric: She has a normal mood and affect. Her behavior is normal. Judgment and thought content normal.      Assessment & Plan:   Problem List Items Addressed This Visit      Unprioritized   Edema of both ankles    Suspect related to insulin and hot weather.  Cardiology w/u was normal      Relevant Orders   Comprehensive metabolic panel   TSH   Lipid Panel w/o Chol/HDL Ratio   Gastroparesis    Refill Reglan and take on a regular basis  until improving.  Stressed good control of blood sugar          Follow up plan: Return for check up soon.

## 2016-07-27 LAB — COMPREHENSIVE METABOLIC PANEL
ALT: 9 IU/L (ref 0–32)
AST: 12 IU/L (ref 0–40)
Albumin/Globulin Ratio: 1.7 (ref 1.2–2.2)
Albumin: 4.3 g/dL (ref 3.5–5.5)
Alkaline Phosphatase: 50 IU/L (ref 39–117)
BUN/Creatinine Ratio: 24 — ABNORMAL HIGH (ref 9–23)
BUN: 15 mg/dL (ref 6–20)
Bilirubin Total: 0.4 mg/dL (ref 0.0–1.2)
CO2: 22 mmol/L (ref 20–29)
Calcium: 9.3 mg/dL (ref 8.7–10.2)
Chloride: 104 mmol/L (ref 96–106)
Creatinine, Ser: 0.62 mg/dL (ref 0.57–1.00)
GFR calc Af Amer: 145 mL/min/{1.73_m2} (ref >59)
GFR calc non Af Amer: 126 mL/min/{1.73_m2} (ref >59)
Globulin, Total: 2.5 g/dL (ref 1.5–4.5)
Glucose: 188 mg/dL — ABNORMAL HIGH (ref 65–99)
Potassium: 4.6 mmol/L (ref 3.5–5.2)
Sodium: 140 mmol/L (ref 134–144)
Total Protein: 6.8 g/dL (ref 6.0–8.5)

## 2016-07-27 LAB — LIPID PANEL W/O CHOL/HDL RATIO
Cholesterol, Total: 181 mg/dL (ref 100–199)
HDL: 34 mg/dL — ABNORMAL LOW (ref >39)
LDL Calculated: 128 mg/dL — ABNORMAL HIGH (ref 0–99)
Triglycerides: 96 mg/dL (ref 0–149)
VLDL Cholesterol Cal: 19 mg/dL (ref 5–40)

## 2016-07-27 LAB — TSH: TSH: 2.16 u[IU]/mL (ref 0.450–4.500)

## 2016-07-29 ENCOUNTER — Encounter: Payer: Self-pay | Admitting: Unknown Physician Specialty

## 2016-07-31 ENCOUNTER — Other Ambulatory Visit: Payer: Self-pay | Admitting: Unknown Physician Specialty

## 2016-08-07 ENCOUNTER — Encounter: Payer: BLUE CROSS/BLUE SHIELD | Admitting: Unknown Physician Specialty

## 2016-08-16 ENCOUNTER — Encounter: Payer: Self-pay | Admitting: Unknown Physician Specialty

## 2016-08-16 ENCOUNTER — Ambulatory Visit (INDEPENDENT_AMBULATORY_CARE_PROVIDER_SITE_OTHER): Payer: BLUE CROSS/BLUE SHIELD | Admitting: Unknown Physician Specialty

## 2016-08-16 VITALS — BP 142/97 | HR 77 | Temp 98.7°F | Ht 65.6 in | Wt 192.9 lb

## 2016-08-16 DIAGNOSIS — E7800 Pure hypercholesterolemia, unspecified: Secondary | ICD-10-CM | POA: Diagnosis not present

## 2016-08-16 DIAGNOSIS — Z0000 Encounter for general adult medical examination without abnormal findings: Secondary | ICD-10-CM

## 2016-08-16 DIAGNOSIS — Z794 Long term (current) use of insulin: Secondary | ICD-10-CM | POA: Diagnosis not present

## 2016-08-16 DIAGNOSIS — I10 Essential (primary) hypertension: Secondary | ICD-10-CM | POA: Diagnosis not present

## 2016-08-16 DIAGNOSIS — E1165 Type 2 diabetes mellitus with hyperglycemia: Secondary | ICD-10-CM | POA: Diagnosis not present

## 2016-08-16 NOTE — Progress Notes (Signed)
BP (!) 142/97 (BP Location: Left Arm, Cuff Size: Normal)   Pulse 77   Temp 98.7 F (37.1 C)   Ht 5' 5.6" (1.666 m)   Wt 192 lb 14.4 oz (87.5 kg)   LMP 08/12/2016 (Exact Date)   SpO2 98%   BMI 31.52 kg/m    Subjective:    Patient ID: Kristen Tran, female    DOB: 27-Jan-1990, 26 y.o.   MRN: [id number]  HPI: Kristen Tran is a 26 y.o. female  Chief Complaint  Patient presents with  . Annual Exam  . Hypertension    pt states she has not taken her BP medication yet today    Diabetes  Per endocrinology.  States highs are not as high.  Reviewed outside notes.  Hgb A1C was 9.3% 6/18  Hypertension Using medications without difficulty Average BPs- typically high at endocrine but OK at other doctors.  No problems or lightheadedness No chest pain with exertion or shortness of breath No Edema  Hypercholeterolemia Unable to calculate ASCVD risk due to young age.  No CVD in family  Relevant past medical, surgical, family and social history reviewed and updated as indicated. Interim medical history since our last visit reviewed. Allergies and medications reviewed and updated.  Review of Systems  Constitutional: Negative.   HENT: Negative.   Eyes: Negative.   Respiratory: Negative.   Cardiovascular: Negative.   Gastrointestinal: Negative.   Endocrine: Negative.   Genitourinary: Negative.   Musculoskeletal: Negative.   Skin: Negative.   Allergic/Immunologic: Negative.   Neurological: Negative.   Hematological: Negative.   Psychiatric/Behavioral: Negative.     Per HPI unless specifically indicated above     Objective:    BP (!) 142/97 (BP Location: Left Arm, Cuff Size: Normal)   Pulse 77   Temp 98.7 F (37.1 C)   Ht 5' 5.6" (1.666 m)   Wt 192 lb 14.4 oz (87.5 kg)   LMP 08/12/2016 (Exact Date)   SpO2 98%   BMI 31.52 kg/m   Wt Readings from Last 3 Encounters:  08/16/16 192 lb 14.4 oz (87.5 kg)  07/26/16 193 lb 12.8 oz (87.9  kg)  05/07/16 200 lb 11.2 oz (91 kg)    Physical Exam  Constitutional: She is oriented to person, place, and time. She appears well-developed and well-nourished. No distress.  HENT:  Head: Normocephalic and atraumatic.  Eyes: Conjunctivae and lids are normal. Right eye exhibits no discharge. Left eye exhibits no discharge. No scleral icterus.  Neck: Normal range of motion. Neck supple. No JVD present. Carotid bruit is not present.  Cardiovascular: Normal rate, regular rhythm and normal heart sounds.   Pulmonary/Chest: Effort normal and breath sounds normal.  Abdominal: Normal appearance. There is no splenomegaly or hepatomegaly.  Musculoskeletal: Normal range of motion.  Neurological: She is alert and oriented to person, place, and time.  Skin: Skin is warm, dry and intact. No rash noted. No pallor.  Psychiatric: She has a normal mood and affect. Her behavior is normal. Judgment and thought content normal.    Results for orders placed or performed in visit on 07/26/16  Comprehensive metabolic panel  Result Value Ref Range   Glucose 188 (H) 65 - 99 mg/dL   BUN 15 6 - 20 mg/dL   Creatinine, Ser 4.740.62 0.57 - 1.00 mg/dL   GFR calc non Af Amer 126 >59 mL/min/1.73   GFR calc Af Amer 145 >59 mL/min/1.73   BUN/Creatinine Ratio 24 (H) 9 - 23   Sodium  140 134 - 144 mmol/L   Potassium 4.6 3.5 - 5.2 mmol/L   Chloride 104 96 - 106 mmol/L   CO2 22 20 - 29 mmol/L   Calcium 9.3 8.7 - 10.2 mg/dL   Total Protein 6.8 6.0 - 8.5 g/dL   Albumin 4.3 3.5 - 5.5 g/dL   Globulin, Total 2.5 1.5 - 4.5 g/dL   Albumin/Globulin Ratio 1.7 1.2 - 2.2   Bilirubin Total 0.4 0.0 - 1.2 mg/dL   Alkaline Phosphatase 50 39 - 117 IU/L   AST 12 0 - 40 IU/L   ALT 9 0 - 32 IU/L  TSH  Result Value Ref Range   TSH 2.160 0.450 - 4.500 uIU/mL  Lipid Panel w/o Chol/HDL Ratio  Result Value Ref Range   Cholesterol, Total 181 100 - 199 mg/dL   Triglycerides 96 0 - 149 mg/dL   HDL 34 (L) >16 mg/dL   VLDL Cholesterol Cal 19  5 - 40 mg/dL   LDL Calculated 109 (H) 0 - 99 mg/dL      Assessment & Plan:   Problem List Items Addressed This Visit      Unprioritized   Diabetes mellitus (HCC)    Per Endocrine.  Notes reviewed and uncontrolled but showed improvement last visit.  [facility]      Hyperlipidemia    No clear guidelines for this young poorly controlled diabetic.  Hold statin and work on diabetes and BP control      Hypertension    Not to goal today but did not take medications and OK outside the office.  Continue present medications.         Other Visit Diagnoses    Annual physical exam    -  Primary       Follow up plan: Return in about 6 months (around 02/16/2017).

## 2016-08-16 NOTE — Assessment & Plan Note (Addendum)
Per Endocrine.  Notes reviewed and uncontrolled but showed improvement last visit.  Has been to [facility]

## 2016-08-16 NOTE — Assessment & Plan Note (Addendum)
Not to goal today but did not take medications and OK outside the office.  Continue present medications.

## 2016-08-16 NOTE — Assessment & Plan Note (Signed)
No clear guidelines for this young poorly controlled diabetic.  Hold statin and work on diabetes and BP control

## 2016-10-07 ENCOUNTER — Emergency Department
Admission: EM | Admit: 2016-10-07 | Discharge: 2016-10-08 | Disposition: A | Discharge status: Home / Self Care | Payer: MEDICAID | Source: Intra-hospital

## 2016-10-07 ENCOUNTER — Emergency Department
Admission: EM | Admit: 2016-10-07 | Discharge: 2016-10-08 | Disposition: A | Discharge status: Home / Self Care | Payer: MEDICAID | Source: Intra-hospital | Attending: Emergency Medicine

## 2016-10-07 DIAGNOSIS — R55 Syncope and collapse: Principal | ICD-10-CM

## 2016-10-10 ENCOUNTER — Encounter: Payer: Self-pay | Admitting: Family Medicine

## 2016-10-10 ENCOUNTER — Ambulatory Visit (INDEPENDENT_AMBULATORY_CARE_PROVIDER_SITE_OTHER): Payer: Self-pay | Admitting: Family Medicine

## 2016-10-10 VITALS — BP 144/97 | HR 81 | Temp 99.3°F | Wt 193.0 lb

## 2016-10-10 DIAGNOSIS — M545 Low back pain, unspecified: Secondary | ICD-10-CM

## 2016-10-10 DIAGNOSIS — R55 Syncope and collapse: Secondary | ICD-10-CM

## 2016-10-10 LAB — MICROSCOPIC EXAMINATION
Bacteria, UA: NONE SEEN
RBC, UA: NONE SEEN /hpf (ref 0–?)

## 2016-10-10 LAB — UA/M W/RFLX CULTURE, ROUTINE
Bilirubin, UA: NEGATIVE
Leukocytes, UA: NEGATIVE
Nitrite, UA: NEGATIVE
Protein, UA: NEGATIVE
RBC, UA: NEGATIVE
Specific Gravity, UA: 1.02 (ref 1.005–1.030)
Urobilinogen, Ur: 1 mg/dL (ref 0.2–1.0)
pH, UA: 6 (ref 5.0–7.5)

## 2016-10-10 NOTE — Progress Notes (Signed)
BP (!) 144/97   Pulse 81   Temp 99.3 F (37.4 C)   Wt 193 lb (87.5 kg)   LMP 09/20/2016 (Exact Date)   SpO2 100%   BMI 31.53 kg/m    Subjective:    Patient ID: Kristen Tran, female    DOB: [birth year], 26 y.o.   MRN: [id number]  HPI: Kristen Tran is a 26 y.o. female  Chief Complaint  Patient presents with  . Hospitalization Follow-up    Had a syncopal episode due to hypoglycemia, low BP. Still not feeling good. Will be calling cardiology today to set up appt per ED.   Patient presents for ER f/u after syncopal episode with vomiting 2 days ago. Episode occurred right after taking her mealtime insulin dose prior to her meal. ER found her to be hypotensive and bradycardic with BS of 117 after a mountain dew and cake frosting. Denies any dietary changes or recent illnesses prior to episode, and no medication changes. Episode suspected to be d/t hypoglycemia but Cardiology referral was given. Has seen Dr. Ernestine Mcmurray in the past and would like to go back for this appt. Pt states she is improving, but still pretty weak and tired. Would like her urine checked as she's been having some low back aching.  Of note, pt is overdue for regular f/u with Endocrinology.   Relevant past medical, surgical, family and social history reviewed and updated as indicated. Interim medical history since our last visit reviewed. Allergies and medications reviewed and updated.  Review of Systems  Constitutional: Positive for fatigue.  HENT: Negative.   Respiratory: Negative.   Gastrointestinal: Negative.   Musculoskeletal: Negative.   Neurological: Positive for weakness.  Psychiatric/Behavioral: Negative.     Per HPI unless specifically indicated above     Objective:    BP (!) 144/97   Pulse 81   Temp 99.3 F (37.4 C)   Wt 193 lb (87.5 kg)   LMP 09/20/2016 (Exact Date)   SpO2 100%   BMI 31.53 kg/m   Wt Readings from Last 3 Encounters:  10/10/16 193 lb (87.5  kg)  08/16/16 192 lb 14.4 oz (87.5 kg)  07/26/16 193 lb 12.8 oz (87.9 kg)    Physical Exam  Constitutional: She is oriented to person, place, and time. She appears well-developed and well-nourished.  HENT:  Head: Atraumatic.  Eyes: Pupils are equal, round, and reactive to light. Conjunctivae are normal. No scleral icterus.  Neck: Normal range of motion. Neck supple.  Pulmonary/Chest: Effort normal and breath sounds normal. No respiratory distress.  Abdominal: Soft. Bowel sounds are normal. She exhibits no distension. There is no tenderness. There is no guarding.  Musculoskeletal: Normal range of motion.  Neurological: She is alert and oriented to person, place, and time. No cranial nerve deficit.  Skin: Skin is warm and dry.  Psychiatric: She has a normal mood and affect. Her behavior is normal. Thought content normal.  Nursing note and vitals reviewed.   Results for orders placed or performed in visit on 10/10/16  Microscopic Examination  Result Value Ref Range   WBC, UA 0-5 0 - 5 /hpf   RBC, UA None seen 0 - 2 /hpf   Epithelial Cells (non renal) 0-10 0 - 10 /hpf   Bacteria, UA None seen None seen/Few  UA/M w/rflx Culture, Routine  Result Value Ref Range   Specific Gravity, UA 1.020 1.005 - 1.030   pH, UA 6.0 5.0 - 7.5   Color, UA Yellow Yellow  Appearance Ur Cloudy (A) Clear   Leukocytes, UA Negative Negative   Protein, UA Negative Negative/Trace   Glucose, UA 2+ (A) Negative   Ketones, UA 4+ (A) Negative   RBC, UA Negative Negative   Bilirubin, UA Negative Negative   Urobilinogen, Ur 1.0 0.2 - 1.0 mg/dL   Nitrite, UA Negative Negative   Microscopic Examination See below:       Assessment & Plan:   Problem List Items Addressed This Visit    None    Visit Diagnoses    Syncope, unspecified syncope type    -  Primary   Suspected to be d/t hypoglycemia. Stressed importance of keeping Endocrinology appts. Will schedule ASAP, Cardiology consult upcoming.    Acute  bilateral low back pain without sciatica       Suspect musculoskeletal, U/A - for UTI. Rest, heating pad, good hydration, tylenol.    Relevant Orders   UA/M w/rflx Culture, Routine (Completed)    Pt aware that she is to go to the ER for any further syncopal episodes.   Follow up plan: Return for Endocrinology and Cardiology.

## 2016-10-11 ENCOUNTER — Emergency Department
Admission: EM | Admit: 2016-10-11 | Discharge: 2016-10-11 | Disposition: A | Discharge status: Home / Self Care | Payer: MEDICAID | Source: Intra-hospital | Attending: Critical Care Medicine | Admitting: Critical Care Medicine

## 2016-10-11 MED ORDER — ACYCLOVIR 400 MG TABLET
ORAL_TABLET | Freq: Three times a day (TID) | ORAL | 0 refills | 0.00000 days | Status: CP
Start: 2016-10-11 — End: 2016-10-21

## 2016-10-11 MED ORDER — HYDROXYZINE HCL 25 MG TABLET
ORAL_TABLET | Freq: Three times a day (TID) | ORAL | 0 refills | 0.00000 days | Status: CP | PRN
Start: 2016-10-11 — End: ?

## 2016-10-12 NOTE — Patient Instructions (Signed)
Follow up for specialist consults

## 2016-10-15 ENCOUNTER — Encounter: Payer: Self-pay | Admitting: Nurse Practitioner

## 2016-10-15 ENCOUNTER — Ambulatory Visit (INDEPENDENT_AMBULATORY_CARE_PROVIDER_SITE_OTHER): Payer: Self-pay | Admitting: Nurse Practitioner

## 2016-10-15 VITALS — BP 126/80 | HR 83 | Ht 65.5 in | Wt 195.5 lb

## 2016-10-15 DIAGNOSIS — R079 Chest pain, unspecified: Secondary | ICD-10-CM

## 2016-10-15 DIAGNOSIS — R55 Syncope and collapse: Secondary | ICD-10-CM

## 2016-10-15 NOTE — Progress Notes (Signed)
Office Visit    Patient Name: Kristen Tran Date of Encounter: 10/15/2016  Primary Care Provider:  Gabriel Cirri, NP Primary Cardiologist:  Formerly A. Alvino Chapel, MD   Chief Complaint    26 year old female with a history of recurrent syncope, diabetes, obesity, hypertension, anxiety, depression, and atypical chest pain, presents for follow-up related to chest pain.  Past Medical History    Past Medical History:  Diagnosis Date  . Anemia   . Anxiety   . Asthma   . Atopic dermatitis   . Atypical chest pain   . Bronchopneumonia   . Depression   . Diabetes mellitus without complication (HCC)    a. Dx @ age 62.  . Gastroparesis   . GERD (gastroesophageal reflux disease)   . Hidradenitis suppurativa   . Hypertension   . Obesity   . Syncope    a. Recurrent - in setting of hypoglycemia; b. 12/2015 Echo: EF EF 60-65%, no rwma, mild conc hypertrophy w/ near cavity obliteration in systole.  Nl RV fxn.   Past Surgical History:  Procedure Laterality Date  . CHOLECYSTECTOMY    . FRACTURE SURGERY    . HYDRADENITIS EXCISION     pt states she has a place removed from right underarm and replaced it with a skin graft from right leg  . WISDOM TOOTH EXTRACTION      Allergies  Allergies  Allergen Reactions  . Bee Venom   . Ibuprofen Diarrhea and Nausea And Vomiting  . Morphine Sulfate Itching  . Nsaids Other (See Comments)  . Tomato Swelling  . Wool Alcohol [Lanolin] Hives  . Contrast Media [Iodinated Diagnostic Agents] Nausea And Vomiting and Rash    History of Present Illness    26 year old female with the above complex past medical history: Diabetes, diagnosed at age 66. Other history includes hypertension, obesity, anxiety, depression, and recurrent syncope. She was previously evaluated in late 2017 due to syncopal episodes that were occurring in the setting of high blood sugars and elevated glucose levels. There is also some concern about vasovagal syncope.  Echocardiogram performed at the time showed normal LV function with concentric LVH and near cavity obliteration in systole. RV function was normal. Recommendation was made for maintaining adequate hydration and following blood glucoses closely. Patient says that she has a many year history of intermittent passing out spells. Oftentimes these occur with hypoglycemia as well. She denies palpitations. She walks regularly and typically is able to complete a 1 hour walk without any chest pain or dyspnea. On October 1, she has returned from an uncle funeral and was emotionally upset. She had not eaten much that day. She began to experience symptoms of hypoglycemia such as restlessness, fatigue, and profuse diaphoresis. She learned her cousin who gave her some [facility]. Despite [facility], blood glucose was still some work under 100. She apparently lost consciousness and EMS was called. Upon EMS arrival, per ER notes, she was hypotensive and bradycardic. She was taken to the emergency department at [facility] where vital signs stabilized. She was noted to have what sounds like early repolarization on ECG, which was similar to prior ECG. She was subsequently discharged home after she stabilized. Following that episode, she noted pleuritic chest discomfort that was especially more pronounced when doing her daily walk and at higher levels of activity. Throughout the day, the discomfort might be a 2/10, but when really exerting, his comfort would be more like a 5/10 with deep breathing. On October 5, she  awoke with worsened pleuritic chest discomfort and alerted her cousin. At some point, she apparently lost consciousness and developed nausea and vomiting. EMS was called and she was taken to the [facility] emergency department. There, she was hemodynamically stable. She continued to complain of pleuritic chest pain. She also complained of a rash on her left thigh was diagnosed with shingles and was treated with acyclovir. It was felt  that symptoms most likely represented panic attacks. Troponin was normal and ECG nonacute. She was subsequently discharged and advised to follow-up with cardiology.  Since her last ER visit on October 5, she is continue to have mild pleuritic chest discomfort. She's been walking daily without any significant limitations. She denies PND, orthopnea, dizziness, edema, or early satiety.  Home Medications    Prior to Admission medications   Medication Sig Start Date End Date Taking? Authorizing Provider  albuterol (VENTOLIN HFA) 108 (90 BASE) MCG/ACT inhaler Inhale 2 puffs into the lungs every 6 (six) hours as needed for wheezing or shortness of breath.   Yes [provider]  canagliflozin (INVOKANA) 300 MG TABS tablet Take 300 mg by mouth daily. 03/11/16 03/11/17 Yes [provider]  cetirizine (ZYRTEC) 10 MG tablet Take 10 mg by mouth daily. As needed for allergies   Yes [provider]  cyclobenzaprine (FLEXERIL) 10 MG tablet Take 1 tablet (10 mg total) by mouth at bedtime. Reported on 05/26/2015 06/28/16  Yes Gabriel Cirri, NP  diphenhydrAMINE (BENADRYL) 25 mg capsule Take 1 capsule (25 mg total) by mouth every 4 (four) hours as needed for itching. 01/01/16 12/31/16 Yes Rebecka Apley, MD  ferrous fumarate (HEMOCYTE - 106 MG FE) 325 (106 FE) MG TABS tablet Take 1 tablet by mouth. Reported on 06/07/2015   Yes [provider]  glucose blood test strip 1 each by Other route as needed for other. Use as instructed   Yes [provider]  Insulin Glargine (LANTUS SOLOSTAR) 100 UNIT/ML Solostar Pen Inject 50 Units into the skin daily. In AM   Yes [provider]  insulin regular (NOVOLIN R,HUMULIN R) 250 units/2.52mL (100 units/mL) injection SQ AC TID - 5u plus 2u for every 50points over 150. Up to 50u daily. Replaces Novolog. 04/29/16 04/29/17 Yes [provider]  Insulin Syringe-Needle U-100 (INSULIN SYRINGE .5CC/31GX5/16") 31G X 5/16" 0.5 ML MISC  ok to sub any brand or size insulin syringe preferred by insurance/patient, use 5x/day, dx E11.65 04/29/16  Yes [provider]  metFORMIN (GLUCOPHAGE-XR) 500 MG 24 hr tablet Take 500 mg by mouth 2 (two) times daily.    Yes [provider]  metoCLOPramide (REGLAN) 10 MG tablet Take 1 tablet (10 mg total) by mouth daily as needed. 07/26/16  Yes Gabriel Cirri, NP  metoprolol succinate (TOPROL-XL) 50 MG 24 hr tablet Take 1 tablet (50 mg total) by mouth daily. Take with or immediately following a meal. 05/07/16  Yes Gabriel Cirri, NP  montelukast (SINGULAIR) 10 MG tablet Take 10 mg by mouth at bedtime. As needed   Yes [provider]  norethindrone (ORTHO MICRONOR) 0.35 MG tablet Take 1 tablet (0.35 mg total) by mouth daily. 08/04/15  Yes Gabriel Cirri, NP  omeprazole (PRILOSEC) 20 MG capsule Take 20 mg by mouth daily.   Yes [provider]  ondansetron (ZOFRAN ODT) 4 MG disintegrating tablet Take 1 tablet (4 mg total) by mouth every 8 (eight) hours as needed for nausea or vomiting. 10/11/15  Yes Particia Nearing, PA-C  promethazine (PHENERGAN) 25 MG  tablet Take 1 tablet (25 mg total) by mouth every 8 (eight) hours as needed for nausea or vomiting. 11/28/15  Yes Gabriel Cirri, NP  spironolactone (ALDACTONE) 100 MG tablet Take 100 mg by mouth daily. 09/26/15  Yes [provider]  triamcinolone cream (KENALOG) 0.1 % Apply 1 application topically 2 (two) times daily. 05/07/16  Yes Gabriel Cirri, NP  valACYclovir (VALTREX) 500 MG tablet take 1 tablet by mouth twice a day 07/31/16  Yes Gabriel Cirri, NP    Review of Systems    Recurrent syncope in the setting of hypoglycemia as outlined above. Recent bouts of pleuritic chest pain. Some dyspnea on exertion with higher levels of activity. She denies PND, orthopnea, edema, or early satiety.  All other systems reviewed and are otherwise negative except as noted above.  Physical Exam    VS:  BP 126/80 (BP  Location: Left Arm, Patient Position: Sitting, Cuff Size: Normal)   Pulse 83   Ht 5' 5.5" (1.664 m)   Wt 195 lb 8 oz (88.7 kg)   LMP 09/20/2016 (Exact Date)   BMI 32.04 kg/m  , BMI Body mass index is 32.04 kg/m. GEN: Well nourished, well developed, in no acute distress.  HEENT: normal.  Neck: Supple, no JVD, carotid bruits, or masses. Cardiac: RRR,1/6 systolic murmur at the upper sternal borders, no rubs, or gallops. No clubbing, cyanosis, edema.  Radials/DP/PT 2+ and equal bilaterally.  Respiratory:  Respirations regular and unlabored, clear to auscultation bilaterally. GI: Soft, nontender, nondistended, BS + x 4. MS: no deformity or atrophy. Skin: warm and dry, no rash. Neuro:  Strength and sensation are intact. Psych: Normal affect.  Accessory Clinical Findings    ECG - Regular sinus rhythm, nonspecific T changes.  Assessment & Plan    1.  Pleuritic chest pain: Patient has been having ongoing dull aching since ER visit on October 1 in the setting of loss of consciousness and hypoglycemia. Discomfort is described as a 2/10 dull ache but with higher levels of activity and heavier breathing, she has an intermittent sharp chest pain sensation. This worsened October 5 prompting a second ER visit. At that time, troponin was normal. I do not have ECG tracings from either ear visit but based on reports, they appear to show what I'm seeing today which is nonspecific T changes and J-point elevation in V2. She has been walking regularly for about an hour despite ongoing symptoms. Given history of diabetes diagnosed at an early age, along with a family history of premature CAD with her father dying in a young age, I will arrange for an exercise treadmill test to evaluate for ECG changes during activity/ischemia. We did discuss other causes of pleuritic chest pain. She has not had any recent sick contacts. She does not have a rub on exam. There is no evidence of diffuse ST elevation or PR depression  on ECG. I do not think this represents pericarditis though if symptoms persist, we could add colchicine. She is not tachycardic or hypoxic. Doubt PE.   2. Syncope: Patient with recent loss of consciousness in the setting of hypoglycemia on October 1. Also possible recurrent loss of consciousness on October 5 in the setting of pleuritic chest pain and nausea with vomiting. On both occasions, ER evaluation was unrevealing for significant findings. She reports a long history of intermittent syncope which in the past was felt to be secondary to dehydration, hypoglycemia, or possibly vasovagal syncope. Echo in December 2017 showed normal LV function. She does  have some hypertrophy with near cavity obliteration and a soft systolic murmur on exam. We did discuss the role of event monitoring. Symptoms have only occurred once every few months thus a 30 day event monitor will likely be low yield. She prefers to avoid monitoring if at all possible. As above, we will obtain an exercise treadmill test to watch her heart rhythm and assess for ischemic ECG changes during activity. Can reconsider event monitoring in the future for any recurrent symptoms/palpitations.   3. Diabetes mellitus: Followed by primary care. She reports a long history of erratic sugars which have resulted in hypoglycemia and loss of consciousness as well as elevated sugars resulting in dehydration and loss of consciousness.  4. Disposition: Follow-up exercise treadmill testing with office follow-up to be determined thereafter.   Nicolasa Ducking, NP 10/15/2016, 5:39 PM

## 2016-10-15 NOTE — Patient Instructions (Addendum)
Medication Instructions:  Your physician recommends that you continue on your current medications as directed. Please refer to the Current Medication list given to you today.   Labwork: none  Testing/Procedures: Your physician has requested that you have an exercise tolerance test. For further information please visit [email or web address] Please also follow instruction sheet, as given.   DO NOT drink or eat foods with caffeine for 24 hours before the test. (Chocolate, coffee, tea, decaf coffee/tea, or energy drinks)  DO NOT smoke for 4 hours before your test.  If you use an inhaler, bring it with you to the test.  Wear comfortable shoes and clothing. Women do not wear dresses.  DO NOT TAKE YOUR METOPROLOL the morning of the procedure.    Follow-Up: Your physician wants you to follow-up in: 6 MONTHS WITH DR END. You will receive a reminder letter in the mail two months in advance. If you don't receive a letter, please call our office to schedule the follow-up appointment.   If you need a refill on your cardiac medications before your next appointment, please call your pharmacy.

## 2016-10-16 ENCOUNTER — Ambulatory Visit: Payer: BLUE CROSS/BLUE SHIELD | Admitting: Nurse Practitioner

## 2016-10-16 NOTE — Addendum Note (Signed)
Addended by: Margrett Rud on: 10/16/2016 09:00 AM   Modules accepted: Orders

## 2016-11-05 ENCOUNTER — Telehealth: Payer: Self-pay | Admitting: Unknown Physician Specialty

## 2016-11-05 MED ORDER — SPIRONOLACTONE 100 MG PO TABS: 100.0000 mg | ORAL_TABLET | Freq: Every day | ORAL | 1 refills | Status: AC

## 2016-11-05 MED ORDER — ACYCLOVIR 400 MG PO TABS: 400.0000 mg | ORAL_TABLET | Freq: Three times a day (TID) | ORAL | 1 refills | Status: DC

## 2016-11-05 NOTE — Addendum Note (Signed)
Addended by: Gabriel CirriWICKER, Tyon Cerasoli on: 11/05/2016 04:52 PM   Modules accepted: Orders

## 2016-11-05 NOTE — Telephone Encounter (Signed)
Pt requesting refill on Acyclovir and Spironolactone    Copied from CRM #[id number]. Topic: Inquiry >> Nov 05, 2016  1:40 PM Yvonna Alanisobinson, Andra M wrote: Reason for CRM: Patient called requesting that Gabriel Cirriheryl Wicker send medications Acyclovir 400mg  and Spironolactone 100mg  to her pharmacy. Patient stated that her mother (also Talmadge CoventryCheryl Wicker's patient) talked to Gabriel Cirriheryl Wicker earlier today during her personal appt about these medications for the patient (her daughter). Please call patient asap. Thank You!!!

## 2016-11-05 NOTE — Addendum Note (Signed)
Addended by: Pablo LedgerUSSELL, Shakevia Sarris N on: 11/05/2016 04:30 PM   Modules accepted: Orders

## 2016-11-27 ENCOUNTER — Emergency Department
Admission: EM | Admit: 2016-11-27 | Discharge: 2016-11-27 | Disposition: A | Discharge status: Home / Self Care | Source: Intra-hospital

## 2016-11-27 ENCOUNTER — Ambulatory Visit: Payer: Medicaid Other | Admitting: Unknown Physician Specialty

## 2016-11-27 MED ORDER — FLUTICASONE PROPIONATE 50 MCG/ACTUATION NASAL SPRAY,SUSPENSION
Freq: Every day | NASAL | 0 refills | 0 days | Status: CP
Start: 2016-11-27 — End: 2017-11-27

## 2017-01-17 ENCOUNTER — Telehealth: Payer: Self-pay | Admitting: Unknown Physician Specialty

## 2017-01-17 DIAGNOSIS — Z0000 Encounter for general adult medical examination without abnormal findings: Secondary | ICD-10-CM

## 2017-01-17 MED ORDER — PROMETHAZINE HCL 25 MG PO TABS: 25.0000 mg | ORAL_TABLET | Freq: Three times a day (TID) | ORAL | 2 refills | Status: DC | PRN

## 2017-01-17 MED ORDER — NORETHINDRONE 0.35 MG PO TABS: 1.0000 | ORAL_TABLET | Freq: Every day | ORAL | 4 refills | Status: DC

## 2017-01-17 NOTE — Telephone Encounter (Signed)
Patient needs refills on the following  Promethazine 25mg  Norethindrone  [facility]   Thank you

## 2017-02-18 ENCOUNTER — Encounter: Payer: Self-pay | Admitting: Unknown Physician Specialty

## 2017-02-18 ENCOUNTER — Ambulatory Visit (INDEPENDENT_AMBULATORY_CARE_PROVIDER_SITE_OTHER): Payer: Self-pay | Admitting: Unknown Physician Specialty

## 2017-02-18 VITALS — BP 154/105 | HR 79 | Temp 97.2°F | Wt 185.2 lb

## 2017-02-18 DIAGNOSIS — IMO0001 Reserved for inherently not codable concepts without codable children: Secondary | ICD-10-CM

## 2017-02-18 DIAGNOSIS — E1065 Type 1 diabetes mellitus with hyperglycemia: Secondary | ICD-10-CM

## 2017-02-18 DIAGNOSIS — E1165 Type 2 diabetes mellitus with hyperglycemia: Secondary | ICD-10-CM

## 2017-02-18 DIAGNOSIS — Z794 Long term (current) use of insulin: Secondary | ICD-10-CM

## 2017-02-18 DIAGNOSIS — E7800 Pure hypercholesterolemia, unspecified: Secondary | ICD-10-CM

## 2017-02-18 DIAGNOSIS — I10 Essential (primary) hypertension: Secondary | ICD-10-CM

## 2017-02-18 LAB — BAYER DCA HB A1C WAIVED: HB A1C (BAYER DCA - WAIVED): 12.5 % — ABNORMAL HIGH (ref <7.0)

## 2017-02-18 MED ORDER — EMPAGLIFLOZIN 25 MG PO TABS: 25.0000 mg | ORAL_TABLET | Freq: Every day | ORAL | 2 refills | Status: DC

## 2017-02-18 NOTE — Assessment & Plan Note (Signed)
Hgb AiC is 12.5%.  Awaiting endocrine referral.  Stop soft drinks.  Start [place]ardiance.  Savings card given.

## 2017-02-18 NOTE — Assessment & Plan Note (Signed)
Poor control with recent stress.  Starting Jardiance should help.  Recheck 4 weeks

## 2017-02-18 NOTE — Progress Notes (Signed)
BP (!) 154/105 (BP Location: Left Arm, Cuff Size: Normal)   Pulse 79   Temp (!) 97.2 F (36.2 C) (Oral)   Wt 185 lb 3.2 oz (84 kg)   SpO2 95%   BMI 30.35 kg/m    Subjective:    Patient ID: Kristen Tran, female    DOB: [birth year], 27 y.o.   MRN: [id number]  HPI: Kristen Tran is a 27 y.o. female  Chief Complaint  Patient presents with  . Diabetes    pt states she has not had a recent eye exam  . Hypertension   Having a lot of stress.  Has temporary custody of 4 children.    Hypertension Using medications without difficulty Average home BPs This was 118/80 at home.      No problems or lightheadedness No chest pain with exertion or shortness of breath No Edema  Diabetes Lost to f.u with [facility] due to insurance issues.  Hgb A1C and CMP not done since last summer.  Blood sugars are high.  Fasting blood sugar is 230 as started drinking soda again.  Taking 15 u of Novolin with each meal and 50 of Lantus.  Some foot pain.    Stopped Invokana due to about 4 months due to cost.    Relevant past medical, surgical, family and social history reviewed and updated as indicated. Interim medical history since our last visit reviewed. Allergies and medications reviewed and updated.  Review of Systems  Constitutional: Negative.   Respiratory: Negative.   Cardiovascular: Negative.   Genitourinary: Negative.     Per HPI unless specifically indicated above     Objective:    BP (!) 154/105 (BP Location: Left Arm, Cuff Size: Normal)   Pulse 79   Temp (!) 97.2 F (36.2 C) (Oral)   Wt 185 lb 3.2 oz (84 kg)   SpO2 95%   BMI 30.35 kg/m   Wt Readings from Last 3 Encounters:  02/18/17 185 lb 3.2 oz (84 kg)  10/15/16 195 lb 8 oz (88.7 kg)  10/10/16 193 lb (87.5 kg)    Physical Exam  Constitutional: She is oriented to person, place, and time. She appears well-developed and well-nourished. No distress.  HENT:  Head: Normocephalic and  atraumatic.  Eyes: Conjunctivae and lids are normal. Right eye exhibits no discharge. Left eye exhibits no discharge. No scleral icterus.  Neck: Normal range of motion. Neck supple. No JVD present. Carotid bruit is not present.  Cardiovascular: Normal rate, regular rhythm and normal heart sounds.  Pulmonary/Chest: Effort normal and breath sounds normal.  Abdominal: Normal appearance. There is no splenomegaly or hepatomegaly.  Musculoskeletal: Normal range of motion.  Neurological: She is alert and oriented to person, place, and time.  Skin: Skin is warm, dry and intact. No rash noted. No pallor.  Psychiatric: She has a normal mood and affect. Her behavior is normal. Judgment and thought content normal.    Results for orders placed or performed in visit on 10/10/16  Microscopic Examination  Result Value Ref Range   WBC, UA 0-5 0 - 5 /hpf   RBC, UA None seen 0 - 2 /hpf   Epithelial Cells (non renal) 0-10 0 - 10 /hpf   Bacteria, UA None seen None seen/Few  UA/M w/rflx Culture, Routine  Result Value Ref Range   Specific Gravity, UA 1.020 1.005 - 1.030   pH, UA 6.0 5.0 - 7.5   Color, UA Yellow Yellow   Appearance Ur Cloudy (A) Clear  Leukocytes, UA Negative Negative   Protein, UA Negative Negative/Trace   Glucose, UA 2+ (A) Negative   Ketones, UA 4+ (A) Negative   RBC, UA Negative Negative   Bilirubin, UA Negative Negative   Urobilinogen, Ur 1.0 0.2 - 1.0 mg/dL   Nitrite, UA Negative Negative   Microscopic Examination See below:       Assessment & Plan:   Problem List Items Addressed This Visit      Unprioritized   Diabetes mellitus (HCC) - Primary   Relevant Medications   empagliflozin (JARDIANCE) 25 MG TABS tablet   Other Relevant Orders   Comprehensive metabolic panel   Lipid Panel w/o Chol/HDL Ratio   Bayer DCA Hb A1c Waived   Diabetes mellitus type 1, uncontrolled, without complications (HCC)    Hgb AiC is 12.5%.  Awaiting endocrine referral.  Stop soft drinks.   Start [place]ardiance.  Savings card given.        Relevant Medications   empagliflozin (JARDIANCE) 25 MG TABS tablet   Hyperlipidemia   Relevant Orders   Lipid Panel w/o Chol/HDL Ratio   Hypertension    Poor control with recent stress.  Starting Jardiance should help.  Recheck 4 weeks      Relevant Orders   Comprehensive metabolic panel   Lipid Panel w/o Chol/HDL Ratio   Bayer DCA Hb A1c Waived       Follow up plan: Return in about 4 weeks (around 03/18/2017).

## 2017-02-19 LAB — COMPREHENSIVE METABOLIC PANEL
ALT: 13 IU/L (ref 0–32)
AST: 7 IU/L (ref 0–40)
Albumin/Globulin Ratio: 1.6 (ref 1.2–2.2)
Albumin: 4.3 g/dL (ref 3.5–5.5)
Alkaline Phosphatase: 66 IU/L (ref 39–117)
BUN/Creatinine Ratio: 16 (ref 9–23)
BUN: 9 mg/dL (ref 6–20)
Bilirubin Total: 0.3 mg/dL (ref 0.0–1.2)
CO2: 25 mmol/L (ref 20–29)
Calcium: 9.3 mg/dL (ref 8.7–10.2)
Chloride: 100 mmol/L (ref 96–106)
Creatinine, Ser: 0.58 mg/dL (ref 0.57–1.00)
GFR calc Af Amer: 147 mL/min/{1.73_m2} (ref >59)
GFR calc non Af Amer: 128 mL/min/{1.73_m2} (ref >59)
Globulin, Total: 2.7 g/dL (ref 1.5–4.5)
Glucose: 344 mg/dL — ABNORMAL HIGH (ref 65–99)
Potassium: 4.4 mmol/L (ref 3.5–5.2)
Sodium: 139 mmol/L (ref 134–144)
Total Protein: 7 g/dL (ref 6.0–8.5)

## 2017-02-19 LAB — LIPID PANEL W/O CHOL/HDL RATIO
Cholesterol, Total: 240 mg/dL — ABNORMAL HIGH (ref 100–199)
HDL: 35 mg/dL — ABNORMAL LOW (ref >39)
LDL Calculated: 183 mg/dL — ABNORMAL HIGH (ref 0–99)
Triglycerides: 110 mg/dL (ref 0–149)
VLDL Cholesterol Cal: 22 mg/dL (ref 5–40)

## 2017-02-20 NOTE — Progress Notes (Signed)
Notified pt by mychart

## 2017-03-11 ENCOUNTER — Telehealth: Payer: Self-pay | Admitting: Unknown Physician Specialty

## 2017-03-11 NOTE — Telephone Encounter (Signed)
Called and spoke to patient. She states that she has tried to activate the card through the number on the card and the website provided as well. Neither worked. I asked the patient if she had tried taking the card to the pharmacy and see if they can do anything and she said that she hasn't. Patient states that she will do this and then call me back if she needs anything else or if the card cannot be activated.

## 2017-03-11 NOTE — Telephone Encounter (Signed)
Patient was given the Jauridiance savings card but has tried to activate it and cannot do so.  She cannot afford this without the savings card.   [phone]  Please advise  Thanks

## 2017-03-14 NOTE — Telephone Encounter (Signed)
Called and spoke to patient. Patient needed appointment rescheduled that she had for next week. Appointment rescheduled for 03/28/17.

## 2017-03-14 NOTE — Telephone Encounter (Signed)
Pt called stating that [place] called her and she was returning the call, contact pt if needed

## 2017-03-18 ENCOUNTER — Ambulatory Visit: Payer: Self-pay | Admitting: Unknown Physician Specialty

## 2017-03-28 ENCOUNTER — Ambulatory Visit (INDEPENDENT_AMBULATORY_CARE_PROVIDER_SITE_OTHER): Payer: BLUE CROSS/BLUE SHIELD | Admitting: Unknown Physician Specialty

## 2017-03-28 ENCOUNTER — Encounter: Payer: Self-pay | Admitting: Unknown Physician Specialty

## 2017-03-28 VITALS — BP 143/96 | HR 101 | Temp 99.9°F | Wt 182.9 lb

## 2017-03-28 DIAGNOSIS — L989 Disorder of the skin and subcutaneous tissue, unspecified: Secondary | ICD-10-CM

## 2017-03-28 DIAGNOSIS — I10 Essential (primary) hypertension: Secondary | ICD-10-CM

## 2017-03-28 DIAGNOSIS — N760 Acute vaginitis: Secondary | ICD-10-CM

## 2017-03-28 DIAGNOSIS — IMO0001 Reserved for inherently not codable concepts without codable children: Secondary | ICD-10-CM

## 2017-03-28 DIAGNOSIS — E1065 Type 1 diabetes mellitus with hyperglycemia: Secondary | ICD-10-CM | POA: Diagnosis not present

## 2017-03-28 MED ORDER — DOXYCYCLINE HYCLATE 100 MG PO TABS: 100.0000 mg | ORAL_TABLET | Freq: Two times a day (BID) | ORAL | 0 refills | Status: DC

## 2017-03-28 NOTE — Assessment & Plan Note (Signed)
Scalp.  ? Infectious vs fungal.  Refer to dermatology.  Rx for Doxycycline

## 2017-03-28 NOTE — Assessment & Plan Note (Signed)
Return to endocrine.  Poor control.  Increase Lantus 2 units nightly for goal AM BS of below 120.  Unable to prescribe other medications due to affordability

## 2017-03-28 NOTE — Progress Notes (Signed)
BP (!) 143/96   Pulse (!) 101   Temp 99.9 F (37.7 C) (Oral)   Wt 182 lb 14.4 oz (83 kg)   SpO2 99%   BMI 29.97 kg/m    Subjective:    Patient ID: Kristen Tran, female    DOB: [birth year], 27 y.o.   MRN: [id number]  HPI: Kristen NorlanderMichaiah Jerusha Tran is a 27 y.o. female  Chief Complaint  Patient presents with  . Diabetes    4 week f/up- pt did not start jardiance, states the savings card did not work  . Cyst    pt states she has a cyst in her vaginal area that is very painful, came up 2 days ago and states that it is about the size of 2 golf balls  . blisters    pt states she has blisters in her head that she would like looked at    Diabetes Poor control.  Never picked up the Jardiance I prescribed do to cost.  invokanna is also unaffordable.  Has not been to Endocrine due to family issues.  She is having trouble with cost of medications and savings cards not working. I reviewed her insurance plan and she is on a $7900 deductible. Taking 50 u Lantus QHS with AM Blood sugars 180 or higher.    Abscess Vaginal area.  She has had 3.  This is not unusual for her.  They have drained on their own and this one just started draining.    Hypertension Improved Average home BPs 133/84 2 days ao   No problems or lightheadedness No chest pain with exertion or shortness of breath No Edema  Skin lesions Multiple scalp lesions.  Unable to do hair.    Relevant past medical, surgical, family and social history reviewed and updated as indicated. Interim medical history since our last visit reviewed. Allergies and medications reviewed and updated.  Review of Systems  Per HPI unless specifically indicated above     Objective:    BP (!) 143/96   Pulse (!) 101   Temp 99.9 F (37.7 C) (Oral)   Wt 182 lb 14.4 oz (83 kg)   SpO2 99%   BMI 29.97 kg/m   Wt Readings from Last 3 Encounters:  03/28/17 182 lb 14.4 oz (83 kg)  02/18/17 185 lb 3.2 oz (84 kg)  10/15/16  195 lb 8 oz (88.7 kg)    Physical Exam  Results for orders placed or performed in visit on 02/18/17  Comprehensive metabolic panel  Result Value Ref Range   Glucose 344 (H) 65 - 99 mg/dL   BUN 9 6 - 20 mg/dL   Creatinine, Ser 7.820.58 0.57 - 1.00 mg/dL   GFR calc non Af Amer 128 >59 mL/min/1.73   GFR calc Af Amer 147 >59 mL/min/1.73   BUN/Creatinine Ratio 16 9 - 23   Sodium 139 134 - 144 mmol/L   Potassium 4.4 3.5 - 5.2 mmol/L   Chloride 100 96 - 106 mmol/L   CO2 25 20 - 29 mmol/L   Calcium 9.3 8.7 - 10.2 mg/dL   Total Protein 7.0 6.0 - 8.5 g/dL   Albumin 4.3 3.5 - 5.5 g/dL   Globulin, Total 2.7 1.5 - 4.5 g/dL   Albumin/Globulin Ratio 1.6 1.2 - 2.2   Bilirubin Total 0.3 0.0 - 1.2 mg/dL   Alkaline Phosphatase 66 39 - 117 IU/L   AST 7 0 - 40 IU/L   ALT 13 0 - 32 IU/L  Lipid Panel  w/o Chol/HDL Ratio  Result Value Ref Range   Cholesterol, Total 240 (H) 100 - 199 mg/dL   Triglycerides 161 0 - 149 mg/dL   HDL 35 (L) >09 mg/dL   VLDL Cholesterol Cal 22 5 - 40 mg/dL   LDL Calculated 604 (H) 0 - 99 mg/dL  Bayer DCA Hb V4U Waived  Result Value Ref Range   Bayer DCA Hb A1c Waived 12.5 (H) <7.0 %      Assessment & Plan:   Problem List Items Addressed This Visit      Unprioritized   Diabetes mellitus type 1, uncontrolled, without complications (HCC)    Return to endocrine.  Poor control.  Increase Lantus 2 units nightly for goal AM BS of below 120.  Unable to prescribe other medications due to affordability      Hypertension    Good control.  Continue current medications      Skin lesions - Primary    Scalp.  ? Infectious vs fungal.  Refer to dermatology.  Rx for Doxycycline      Relevant Orders   Ambulatory referral to Dermatology    Other Visit Diagnoses    Abscess of vagina       Draining.  Rx for Doxycycline.  Needs improved BS control.  Asked to go back to Endocrinologist    States she made an appt with Endocrine already   Follow up plan: Return in about 6 months  (around 09/28/2017).

## 2017-03-28 NOTE — Assessment & Plan Note (Signed)
Good control. Continue current medications.

## 2017-04-01 ENCOUNTER — Encounter: Admit: 2017-04-01 | Discharge: 2017-04-02 | Payer: PRIVATE HEALTH INSURANCE | Attending: Family | Primary: Family

## 2017-04-01 DIAGNOSIS — K3184 Gastroparesis: Secondary | ICD-10-CM

## 2017-04-01 DIAGNOSIS — E1165 Type 2 diabetes mellitus with hyperglycemia: Principal | ICD-10-CM

## 2017-04-01 DIAGNOSIS — I10 Essential (primary) hypertension: Secondary | ICD-10-CM

## 2017-04-01 DIAGNOSIS — Z794 Long term (current) use of insulin: Secondary | ICD-10-CM

## 2017-04-01 DIAGNOSIS — E1143 Type 2 diabetes mellitus with diabetic autonomic (poly)neuropathy: Secondary | ICD-10-CM

## 2017-04-01 LAB — MICROALBUMIN, URINE: Microalb, Ur Waived: 11.8

## 2017-04-01 MED ORDER — INSULIN U-100 REGULAR HUMAN 100 UNIT/ML INJECTION SOLUTION
5 refills | 0 days | Status: CP
Start: 2017-04-01 — End: 2017-08-25

## 2017-04-08 ENCOUNTER — Telehealth: Payer: Self-pay | Admitting: Unknown Physician Specialty

## 2017-04-08 MED ORDER — TRIAMCINOLONE ACETONIDE 0.1 % EX CREA: 1.0000 "application " | TOPICAL_CREAM | Freq: Two times a day (BID) | CUTANEOUS | 12 refills | Status: AC

## 2017-04-08 NOTE — Telephone Encounter (Signed)
Patient and mother [facility] [address]  Please Advise.  Thank you

## 2017-05-06 ENCOUNTER — Ambulatory Visit: Admit: 2017-05-06 | Discharge: 2017-05-07 | Payer: PRIVATE HEALTH INSURANCE | Attending: Family | Primary: Family

## 2017-05-06 DIAGNOSIS — E1165 Type 2 diabetes mellitus with hyperglycemia: Principal | ICD-10-CM

## 2017-05-06 DIAGNOSIS — E1143 Type 2 diabetes mellitus with diabetic autonomic (poly)neuropathy: Secondary | ICD-10-CM

## 2017-05-06 DIAGNOSIS — K3184 Gastroparesis: Secondary | ICD-10-CM

## 2017-05-06 DIAGNOSIS — Z794 Long term (current) use of insulin: Secondary | ICD-10-CM

## 2017-05-06 LAB — HM DIABETES FOOT EXAM: HM Diabetic Foot Exam: NORMAL

## 2017-05-06 MED ORDER — METFORMIN 1,000 MG TABLET
ORAL_TABLET | Freq: Two times a day (BID) | ORAL | 3 refills | 0 days | Status: CP
Start: 2017-05-06 — End: 2017-11-25

## 2017-05-06 MED ORDER — BLOOD SUGAR DIAGNOSTIC STRIPS
ORAL_STRIP | Freq: Four times a day (QID) | 3 refills | 0 days | Status: CP
Start: 2017-05-06 — End: 2018-05-06

## 2017-05-14 ENCOUNTER — Encounter
Admit: 2017-05-14 | Discharge: 2017-05-14 | Disposition: A | Discharge status: Home / Self Care | Payer: PRIVATE HEALTH INSURANCE

## 2017-05-14 DIAGNOSIS — R1013 Epigastric pain: Principal | ICD-10-CM

## 2017-05-14 DIAGNOSIS — R1011 Right upper quadrant pain: Secondary | ICD-10-CM

## 2017-05-15 ENCOUNTER — Encounter
Admit: 2017-05-15 | Discharge: 2017-05-15 | Disposition: A | Discharge status: Home / Self Care | Payer: PRIVATE HEALTH INSURANCE

## 2017-05-15 DIAGNOSIS — R109 Unspecified abdominal pain: Principal | ICD-10-CM

## 2017-05-15 MED ORDER — LIDOCAINE 4 % TOPICAL PATCH
MEDICATED_PATCH | Freq: Every day | TRANSDERMAL | 0 refills | 0.00000 days | Status: CP
Start: 2017-05-15 — End: 2017-11-25

## 2017-05-15 MED ORDER — ONDANSETRON 4 MG DISINTEGRATING TABLET
ORAL_TABLET | Freq: Three times a day (TID) | ORAL | 0 refills | 0.00000 days | Status: CP | PRN
Start: 2017-05-15 — End: 2017-05-22

## 2017-05-19 ENCOUNTER — Ambulatory Visit: Payer: BLUE CROSS/BLUE SHIELD | Admitting: Family Medicine

## 2017-05-20 ENCOUNTER — Ambulatory Visit: Payer: BLUE CROSS/BLUE SHIELD | Admitting: Unknown Physician Specialty

## 2017-05-22 ENCOUNTER — Ambulatory Visit: Payer: Self-pay | Admitting: *Deleted

## 2017-05-22 NOTE — Telephone Encounter (Signed)
Pt having complains of abdominal pain on the right side(upper and lower) and a little on the left lower side of the abdomen x 1 week. Pt describes the pain as a sharp pain and states that it has been constant since 4:15pm and is rating the pain at 9. Pt had diarrhea last week but does not have it at this time. Stool is normal in color.  Pt denies any difficulty urinating and does not think she has a fever.Pt was seen in the hospital on 5/8 and 5/9 for complaints of right sided abdominal pan and states she had a [place] of right upper abdomen that did not reveal gallstones. Pt states that she was told at the hospital that she was experiencing rib pain and was given a lidocaine patch. Pt states she is having pain with using the lidocaine patch and states the pain is in the lower part of her abdomen as well. Pt has also been using tylenol and heat to the area with no relief. Pt states she is experiencing some nausea at times and uses Zofran and phenergan which does help. Pt states she also has tenderness to her back when the area is pressed. Pt has appt that was previously scheduled for 5/17 with Phineas Inches. Pt advised that if symptoms worsen during the night before appt to return to the ED. Pt verbalized understanding.  Reason for Disposition . [1] MODERATE pain (e.g., interferes with normal activities) AND [2] pain comes and goes (cramps) AND [3] present > 24 hours  (Exception: pain with Vomiting or Diarrhea - see that Guideline)  Answer Assessment - Initial Assessment Questions 1. LOCATION: "Where does it hurt?"      Right side of abdomen and a little on the left side 2. RADIATION: "Does the pain shoot anywhere else?" (e.g., chest, back)     Back  3. ONSET: "When did the pain begin?" (e.g., minutes, hours or days ago)      About a week 4. SUDDEN: "Gradual or sudden onset?"     gradual 5. PATTERN "Does the pain come and go, or is it constant?"    - If constant: "Is it getting better, staying the same,  or worsening?"      (Note: Constant means the pain never goes away completely; most serious pain is constant and it progresses)     - If intermittent: "How long does it last?" "Do you have pain now?"     (Note: Intermittent means the pain goes away completely between bouts)     Comes and goes but has been constant since 4:15pm 6. SEVERITY: "How bad is the pain?"  (e.g., Scale 1-10; mild, moderate, or severe)   - MILD (1-3): doesn't interfere with normal activities, abdomen soft and not tender to touch    - MODERATE (4-7): interferes with normal activities or awakens from sleep, tender to touch    - SEVERE (8-10): excruciating pain, doubled over, unable to do any normal activities      9 7. RECURRENT SYMPTOM: "Have you ever had this type of abdominal pain before?" If so, ask: "When was the last time?" and "What happened that time?"      No 8. CAUSE: "What do you think is causing the abdominal pain?"     unsure 9. RELIEVING/AGGRAVATING FACTORS: "What makes it better or worse?" (e.g., movement, antacids, bowel movement)     No, was told at the hospital  10. OTHER SYMPTOMS: "Has there been any vomiting, diarrhea, constipation, or urine  problems?"       No 11. PREGNANCY: "Is there any chance you are pregnant?" "When was your last menstrual period?"       No, just had cycle  Protocols used: ABDOMINAL PAIN - [facility]

## 2017-05-23 ENCOUNTER — Encounter: Payer: Self-pay | Admitting: Unknown Physician Specialty

## 2017-05-23 ENCOUNTER — Ambulatory Visit: Payer: BLUE CROSS/BLUE SHIELD | Admitting: Unknown Physician Specialty

## 2017-05-23 VITALS — BP 150/102 | HR 86 | Temp 98.7°F | Ht 65.5 in | Wt 183.5 lb

## 2017-05-23 DIAGNOSIS — R1032 Left lower quadrant pain: Secondary | ICD-10-CM | POA: Diagnosis not present

## 2017-05-23 DIAGNOSIS — R1084 Generalized abdominal pain: Secondary | ICD-10-CM | POA: Diagnosis not present

## 2017-05-23 LAB — UA/M W/RFLX CULTURE, ROUTINE
Bilirubin, UA: NEGATIVE
Ketones, UA: NEGATIVE
Leukocytes, UA: NEGATIVE
Nitrite, UA: NEGATIVE
Protein, UA: NEGATIVE
RBC, UA: NEGATIVE
Specific Gravity, UA: 1.015 (ref 1.005–1.030)
Urobilinogen, Ur: 0.2 mg/dL (ref 0.2–1.0)
pH, UA: 6.5 (ref 5.0–7.5)

## 2017-05-23 LAB — CBC WITH DIFFERENTIAL/PLATELET
Hematocrit: 42.2 % (ref 34.0–46.6)
Hemoglobin: 15.1 g/dL (ref 11.1–15.9)
Lymphocytes Absolute: 1.5 10*3/uL (ref 0.7–3.1)
Lymphs: 30 %
MCH: 31.9 pg (ref 26.6–33.0)
MCHC: 35.8 g/dL — ABNORMAL HIGH (ref 31.5–35.7)
MCV: 89 fL (ref 79–97)
MID (Absolute): 0.3 10*3/uL (ref 0.1–1.6)
MID: 7 %
Neutrophils Absolute: 3.1 10*3/uL (ref 1.4–7.0)
Neutrophils: 63 %
Platelets: 227 10*3/uL (ref 150–379)
RBC: 4.73 x10E6/uL (ref 3.77–5.28)
RDW: 12.2 % — ABNORMAL LOW (ref 12.3–15.4)
WBC: 4.9 10*3/uL (ref 3.4–10.8)

## 2017-05-23 NOTE — Progress Notes (Signed)
BP (!) 150/102   Pulse 86   Temp 98.7 F (37.1 C) (Oral)   Ht 5' 5.5" (1.664 m)   Wt 183 lb 8 oz (83.2 kg)   SpO2 98%   BMI 30.07 kg/m    Subjective:    Patient ID: Kristen Tran, female    DOB: 1990-07-09, 26 y.o.   MRN: [id number]  HPI: Kristen Tran is a 27 y.o. female  Chief Complaint  Patient presents with  . Abdominal Pain    pt states she has been having right side abdominal pain on and off for months, but consistantly for over a week. Pt states she went to [facility], was told her labs looked "ok ish", and feels like they did not address her lower abdominal pain    Abdominal Pain  Chronicity: for months and getting worse.  Went to the ER and had an [place] and chest x-ray.  No gallstones or "cracked ribs" The problem occurs constantly. The problem has been gradually worsening. The pain is located in the LLQ and LUQ. Pain scale: States she has to sit a certain way due to pressure in her lower stomach. Associated symptoms include diarrhea, headaches and nausea. Pertinent negatives include no anorexia, arthralgias, belching, constipation, dysuria, fever, flatus, frequency, hematuria, vomiting or weight loss. Exacerbated by: sitting on right side. Relieved by: laying on left side in a fetal position. There is no history of abdominal surgery, gallstones, irritable bowel syndrome or ulcerative colitis.   Reviewed ER notes on 5/8 and 5/9.  Seems that upper abdominal pain was addressed and dx with costochondritis but not her lower abdominal pain.  PMP May 1.  Other than her expected high blood sugar, labs were normal at [facility] including CBC, ALT/AST and urine.    Relevant past medical, surgical, family and social history reviewed and updated as indicated. Interim medical history since our last visit reviewed. Allergies and medications reviewed and updated.  Review of Systems  Constitutional: Negative for fever and weight loss.  Gastrointestinal: Positive for  abdominal pain, diarrhea and nausea. Negative for anorexia, constipation, flatus and vomiting.  Genitourinary: Negative for dysuria, frequency and hematuria.  Musculoskeletal: Negative for arthralgias.  Neurological: Positive for headaches.    Per HPI unless specifically indicated above     Objective:    BP (!) 150/102   Pulse 86   Temp 98.7 F (37.1 C) (Oral)   Ht 5' 5.5" (1.664 m)   Wt 183 lb 8 oz (83.2 kg)   SpO2 98%   BMI 30.07 kg/m   Wt Readings from Last 3 Encounters:  05/23/17 183 lb 8 oz (83.2 kg)  03/28/17 182 lb 14.4 oz (83 kg)  02/18/17 185 lb 3.2 oz (84 kg)    Physical Exam  Constitutional: She is oriented to person, place, and time. She appears well-developed and well-nourished. No distress.  HENT:  Head: Normocephalic and atraumatic.  Eyes: Conjunctivae and lids are normal. Right eye exhibits no discharge. Left eye exhibits no discharge. No scleral icterus.  Neck: Normal range of motion. Neck supple. No JVD present. Carotid bruit is not present.  Cardiovascular: Normal rate, regular rhythm and normal heart sounds.  Pulmonary/Chest: Effort normal and breath sounds normal.  Abdominal: Normal appearance. There is no splenomegaly or hepatomegaly. There is tenderness in the right upper quadrant and right lower quadrant. There is rebound and guarding. There is no rigidity and no CVA tenderness.  Musculoskeletal: Normal range of motion.  Neurological: She is alert and oriented  to person, place, and time.  Skin: Skin is warm, dry and intact. No rash noted. No pallor.  Psychiatric: She has a normal mood and affect. Her behavior is normal. Judgment and thought content normal.    Results for orders placed or performed in visit on 02/18/17  Comprehensive metabolic panel  Result Value Ref Range   Glucose 344 (H) 65 - 99 mg/dL   BUN 9 6 - 20 mg/dL   Creatinine, Ser 4.09 0.57 - 1.00 mg/dL   GFR calc non Af Amer 128 >59 mL/min/1.73   GFR calc Af Amer 147 >59 mL/min/1.73     BUN/Creatinine Ratio 16 9 - 23   Sodium 139 134 - 144 mmol/L   Potassium 4.4 3.5 - 5.2 mmol/L   Chloride 100 96 - 106 mmol/L   CO2 25 20 - 29 mmol/L   Calcium 9.3 8.7 - 10.2 mg/dL   Total Protein 7.0 6.0 - 8.5 g/dL   Albumin 4.3 3.5 - 5.5 g/dL   Globulin, Total 2.7 1.5 - 4.5 g/dL   Albumin/Globulin Ratio 1.6 1.2 - 2.2   Bilirubin Total 0.3 0.0 - 1.2 mg/dL   Alkaline Phosphatase 66 39 - 117 IU/L   AST 7 0 - 40 IU/L   ALT 13 0 - 32 IU/L  Lipid Panel w/o Chol/HDL Ratio  Result Value Ref Range   Cholesterol, Total 240 (H) 100 - 199 mg/dL   Triglycerides 811 0 - 149 mg/dL   HDL 35 (L) >91 mg/dL   VLDL Cholesterol Cal 22 5 - 40 mg/dL   LDL Calculated 478 (H) 0 - 99 mg/dL  Bayer DCA Hb G9F Waived  Result Value Ref Range   Bayer DCA Hb A1c Waived 12.5 (H) <7.0 %      Assessment & Plan:   Problem List Items Addressed This Visit    None    Visit Diagnoses    Generalized abdominal pain    -  Primary   Relevant Orders   UA/M w/rflx Culture, Routine   LLQ pain       Worsening problem.  WBC and urine negative.  I suspect ovarian cyst.  Order pelvic and abdominal US   Relevant Orders   CBC With Differential/Platelet   US Abdomen Complete   US PELVIC COMPLETE WITH TRANSVAGINAL      If [place] normal, get CT scan  Follow up plan: F/u results.

## 2017-05-29 ENCOUNTER — Telehealth: Payer: Self-pay | Admitting: Unknown Physician Specialty

## 2017-05-29 NOTE — Telephone Encounter (Signed)
Copied from CRM [id number]. Topic: Quick Communication - See Telephone Encounter >> May 29, 2017  1:59 PM Terisa Starr wrote: CRM for notification. See Telephone encounter for: 05/29/17.  Patient called and said she just wants Talmadge [employer] nurse to call her in regards to the update from her dermatologist.

## 2017-05-30 NOTE — Telephone Encounter (Signed)
Called and left patient a VM asking for her to please return my call.  

## 2017-06-03 ENCOUNTER — Other Ambulatory Visit: Payer: Self-pay | Admitting: Unknown Physician Specialty

## 2017-06-03 ENCOUNTER — Telehealth: Payer: Self-pay

## 2017-06-03 DIAGNOSIS — L732 Hidradenitis suppurativa: Secondary | ICD-10-CM

## 2017-06-03 DIAGNOSIS — N838 Other noninflammatory disorders of ovary, fallopian tube and broad ligament: Secondary | ICD-10-CM

## 2017-06-03 DIAGNOSIS — R1030 Lower abdominal pain, unspecified: Secondary | ICD-10-CM

## 2017-06-03 DIAGNOSIS — R102 Pelvic and perineal pain: Secondary | ICD-10-CM

## 2017-06-03 NOTE — Telephone Encounter (Signed)
Called and let patient know what Elnita Maxwell said. Patient states that she has not seen Dr. Meredith Mody in over a year and thinks she may need a new referral to see him. Can we enter the referral for the patient?

## 2017-06-03 NOTE — Telephone Encounter (Signed)
Yes we can.  Is he at [facility]?  Plastic surgery?

## 2017-06-03 NOTE — Telephone Encounter (Signed)
Called and left patient a VM asking for her to please give me a call back if she still needed to speak with me.

## 2017-06-03 NOTE — Telephone Encounter (Signed)
Yes, Dr. Meredith Mody did a great job and that would be an excellent place to go

## 2017-06-03 NOTE — Telephone Encounter (Signed)
Patient returned my call (see other phone encounter). She states that she went to dermatology and was told that she does need surgery. Patient states that the dermatologist that she saw wants her to see another doctor for the surgery. Patient states that Dr. Meredith Mody did the same exact surgery in the past, just on a different region of the body. Patient wants to know if it is OK for her just to go back to Dr. Meredith Mody for the surgery or if she needs to go see this other doctor. She stated it was a gynecologist.

## 2017-06-04 NOTE — Telephone Encounter (Signed)
According to chart, Dr. Meredith Mody is at [facility]. For specialty in chart, it says general surgery.

## 2017-06-10 ENCOUNTER — Other Ambulatory Visit: Payer: Medicaid Other

## 2017-06-12 ENCOUNTER — Other Ambulatory Visit: Payer: Self-pay | Admitting: Unknown Physician Specialty

## 2017-06-12 DIAGNOSIS — R1011 Right upper quadrant pain: Secondary | ICD-10-CM

## 2017-06-12 DIAGNOSIS — R110 Nausea: Secondary | ICD-10-CM

## 2017-06-12 DIAGNOSIS — R197 Diarrhea, unspecified: Secondary | ICD-10-CM

## 2017-06-13 ENCOUNTER — Telehealth: Payer: Self-pay | Admitting: Unknown Physician Specialty

## 2017-06-13 ENCOUNTER — Ambulatory Visit
Admission: RE | Admit: 2017-06-13 | Discharge: 2017-06-13 | Disposition: A | Discharge status: Home / Self Care | Payer: BLUE CROSS/BLUE SHIELD | Source: Ambulatory Visit | Attending: Unknown Physician Specialty | Admitting: Unknown Physician Specialty

## 2017-06-13 ENCOUNTER — Other Ambulatory Visit: Payer: Self-pay | Admitting: Unknown Physician Specialty

## 2017-06-13 DIAGNOSIS — R102 Pelvic and perineal pain unspecified side: Secondary | ICD-10-CM

## 2017-06-13 DIAGNOSIS — R1011 Right upper quadrant pain: Secondary | ICD-10-CM | POA: Diagnosis not present

## 2017-06-13 DIAGNOSIS — R1030 Lower abdominal pain, unspecified: Secondary | ICD-10-CM | POA: Diagnosis not present

## 2017-06-13 DIAGNOSIS — R197 Diarrhea, unspecified: Secondary | ICD-10-CM

## 2017-06-13 DIAGNOSIS — N839 Noninflammatory disorder of ovary, fallopian tube and broad ligament, unspecified: Secondary | ICD-10-CM | POA: Diagnosis not present

## 2017-06-13 DIAGNOSIS — R110 Nausea: Secondary | ICD-10-CM | POA: Insufficient documentation

## 2017-06-13 DIAGNOSIS — R1031 Right lower quadrant pain: Secondary | ICD-10-CM

## 2017-06-13 DIAGNOSIS — N838 Other noninflammatory disorders of ovary, fallopian tube and broad ligament: Secondary | ICD-10-CM

## 2017-06-13 NOTE — Telephone Encounter (Signed)
I don't see the order to sign off on.

## 2017-06-13 NOTE — Progress Notes (Signed)
Call tell scan results were negative.  I am working or ordering a CT scan on her.

## 2017-06-19 ENCOUNTER — Ambulatory Visit
Admission: RE | Admit: 2017-06-19 | Discharge: 2017-06-19 | Disposition: A | Discharge status: Home / Self Care | Payer: BLUE CROSS/BLUE SHIELD | Source: Ambulatory Visit | Attending: Unknown Physician Specialty | Admitting: Unknown Physician Specialty

## 2017-06-19 DIAGNOSIS — Z9049 Acquired absence of other specified parts of digestive tract: Secondary | ICD-10-CM | POA: Insufficient documentation

## 2017-06-19 DIAGNOSIS — R1031 Right lower quadrant pain: Secondary | ICD-10-CM | POA: Insufficient documentation

## 2017-06-20 ENCOUNTER — Telehealth: Payer: Self-pay | Admitting: Family Medicine

## 2017-06-20 NOTE — Telephone Encounter (Signed)
Please let her know that her CT scan came back normal. Thanks!

## 2017-06-20 NOTE — Telephone Encounter (Signed)
Notified via voicemail

## 2017-06-24 ENCOUNTER — Other Ambulatory Visit: Payer: Self-pay

## 2017-06-24 MED ORDER — ACYCLOVIR 400 MG PO TABS: 400.0000 mg | ORAL_TABLET | Freq: Three times a day (TID) | ORAL | 1 refills | Status: DC

## 2017-07-04 ENCOUNTER — Ambulatory Visit: Payer: BLUE CROSS/BLUE SHIELD | Admitting: Unknown Physician Specialty

## 2017-07-04 ENCOUNTER — Encounter: Payer: Self-pay | Admitting: Unknown Physician Specialty

## 2017-07-04 ENCOUNTER — Other Ambulatory Visit: Payer: Self-pay

## 2017-07-04 DIAGNOSIS — R1084 Generalized abdominal pain: Secondary | ICD-10-CM

## 2017-07-04 DIAGNOSIS — R109 Unspecified abdominal pain: Secondary | ICD-10-CM | POA: Insufficient documentation

## 2017-07-04 MED ORDER — GABAPENTIN 300 MG PO CAPS: 300.0000 mg | ORAL_CAPSULE | Freq: Three times a day (TID) | ORAL | 3 refills | Status: DC | PRN

## 2017-07-04 MED ORDER — CYCLOBENZAPRINE HCL 10 MG PO TABS: 10.0000 mg | ORAL_TABLET | Freq: Every day | ORAL | 1 refills | Status: DC

## 2017-07-04 NOTE — Progress Notes (Signed)
BP 122/77 (BP Location: Left Arm, Patient Position: Sitting, Cuff Size: Normal)   Pulse 75   Temp 98.6 F (37 C) (Oral)   Ht 5\' 5"  (1.651 m)   Wt 177 lb 6.4 oz (80.5 kg)   SpO2 99%   BMI 29.52 kg/m    Subjective:    Patient ID: Kristen Tran, female    DOB: 1990/12/13, 27 y.o.   MRN: [id number]  HPI: Kristen Tran is a 27 y.o. female  Chief Complaint  Patient presents with  . Abdominal Pain    Patient states she can't feel touch on her stomach, but feels pressure.    Pt is here for f/u of severe abdominal pain.  Pain is improved, but notes numbness of stomach upper abdomen and right lower quadrant.  States blood sugars are up and down.  Last Hgb A1C was 11.3.  States symptoms keep her up at night. States she can get about 2 hours of sleep.     Relevant past medical, surgical, family and social history reviewed and updated as indicated. Interim medical history since our last visit reviewed. Allergies and medications reviewed and updated.  Review of Systems  Constitutional: Negative.   Respiratory: Negative.   Cardiovascular: Negative.   Psychiatric/Behavioral: Negative.     Per HPI unless specifically indicated above     Objective:    BP 122/77 (BP Location: Left Arm, Patient Position: Sitting, Cuff Size: Normal)   Pulse 75   Temp 98.6 F (37 C) (Oral)   Ht 5\' 5"  (1.651 m)   Wt 177 lb 6.4 oz (80.5 kg)   SpO2 99%   BMI 29.52 kg/m   Wt Readings from Last 3 Encounters:  07/04/17 177 lb 6.4 oz (80.5 kg)  05/23/17 183 lb 8 oz (83.2 kg)  03/28/17 182 lb 14.4 oz (83 kg)    Physical Exam  Constitutional: She is oriented to person, place, and time. She appears well-developed and well-nourished. No distress.  HENT:  Head: Normocephalic and atraumatic.  Eyes: Conjunctivae and lids are normal. Right eye exhibits no discharge. Left eye exhibits no discharge. No scleral icterus.  Cardiovascular: Normal rate.  Pulmonary/Chest: Effort  normal.  Abdominal: Normal appearance. There is no splenomegaly or hepatomegaly.  Musculoskeletal: Normal range of motion.  Neurological: She is alert and oriented to person, place, and time.  Skin: Skin is intact. No rash noted. No pallor.  Psychiatric: She has a normal mood and affect. Her behavior is normal. Judgment and thought content normal.    Results for orders placed or performed in visit on 05/23/17  UA/M w/rflx Culture, Routine  Result Value Ref Range   Specific Gravity, UA 1.015 1.005 - 1.030   pH, UA 6.5 5.0 - 7.5   Color, UA Yellow Yellow   Appearance Ur Hazy (A) Clear   Leukocytes, UA Negative Negative   Protein, UA Negative Negative/Trace   Glucose, UA 3+ (A) Negative   Ketones, UA Negative Negative   RBC, UA Negative Negative   Bilirubin, UA Negative Negative   Urobilinogen, Ur 0.2 0.2 - 1.0 mg/dL   Nitrite, UA Negative Negative  CBC With Differential/Platelet  Result Value Ref Range   WBC 4.9 3.4 - 10.8 x10E3/uL   RBC 4.73 3.77 - 5.28 x10E6/uL   Hemoglobin 15.1 11.1 - 15.9 g/dL   Hematocrit 40.942.2 81.134.0 - 46.6 %   MCV 89 79 - 97 fL   MCH 31.9 26.6 - 33.0 pg   MCHC 35.8 (H) 31.5 -  35.7 g/dL   RDW 16.1 (L) 09.6 - 04.5 %   Platelets 227 150 - 379 x10E3/uL   Neutrophils 63 Not Estab. %   Lymphs 30 Not Estab. %   MID 7 Not Estab. %   Neutrophils Absolute 3.1 1.4 - 7.0 x10E3/uL   Lymphocytes Absolute 1.5 0.7 - 3.1 x10E3/uL   MID (Absolute) 0.3 0.1 - 1.6 X10E3/uL      Assessment & Plan:   Problem List Items Addressed This Visit      Unprioritized   Abdominal pain    New problem.  [place] and CT scan were normal.  Discussed with Dr. 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  Will start Gabapentin 300 mg TID prn.  Discussed taking at night.  Consider appt with Dr. Malvin Johns.            Follow up plan: Return for 3 weeks and then will be pt of Rachel's  .

## 2017-07-04 NOTE — Assessment & Plan Note (Addendum)
New problem.  US and CT scan were normal.  Discussed with Dr. 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  Will start Gabapentin 300 mg TID prn.  Discussed taking at night.  Consider appt with Dr. Malvin JohnsPotter.

## 2017-07-25 ENCOUNTER — Other Ambulatory Visit: Payer: Self-pay

## 2017-07-25 ENCOUNTER — Encounter: Payer: Self-pay | Admitting: Unknown Physician Specialty

## 2017-07-25 ENCOUNTER — Ambulatory Visit: Payer: BLUE CROSS/BLUE SHIELD | Admitting: Unknown Physician Specialty

## 2017-07-25 ENCOUNTER — Ambulatory Visit (INDEPENDENT_AMBULATORY_CARE_PROVIDER_SITE_OTHER): Payer: BLUE CROSS/BLUE SHIELD | Admitting: Unknown Physician Specialty

## 2017-07-25 DIAGNOSIS — F341 Dysthymic disorder: Secondary | ICD-10-CM | POA: Diagnosis not present

## 2017-07-25 DIAGNOSIS — R1084 Generalized abdominal pain: Secondary | ICD-10-CM | POA: Diagnosis not present

## 2017-07-25 NOTE — Progress Notes (Signed)
BP 137/89   Pulse 83   Temp 98.5 F (36.9 C) (Oral)   Ht 5\' 5"  (1.651 m)   Wt 179 lb (81.2 kg)   SpO2 96%   BMI 29.79 kg/m    Subjective:    Patient ID: Kristen Tran, female    DOB: 1990/05/18, 27 y.o.   MRN: [id number]  HPI: Kristen Tran is a 27 y.o. female  Chief Complaint  Patient presents with  . Follow-up    3w  . Diabetes    pt has not have a recent eye exam    Abdominal pain Suspected peripheral neuropathy secondary to DM.  We started her on Gabapentin 300 mg up to TID but only taking it twice.  She states that even taking it once a day works well.    Depression States every now and then she goes through a depressed mood.  States she typically "comes out of it."   Depression screen [facility] 2/9 07/25/2017 08/16/2016 05/07/2016 09/19/2014  Decreased Interest 1 3 2  0  Down, Depressed, Hopeless 1 3 2  0  PHQ - 2 Score 2 6 4  0  Altered sleeping 3 3 3  -  Tired, decreased energy 1 3 2  -  Change in appetite 1 3 3  -  Feeling bad or failure about yourself  1 2 3  -  Trouble concentrating 1 0 0 -  Moving slowly or fidgety/restless 1 0 1 -  Suicidal thoughts 0 0 0 -  PHQ-9 Score 10 17 16  -    Relevant past medical, surgical, family and social history reviewed and updated as indicated. Interim medical history since our last visit reviewed. Allergies and medications reviewed and updated.  Review of Systems  Per HPI unless specifically indicated above     Objective:    BP 137/89   Pulse 83   Temp 98.5 F (36.9 C) (Oral)   Ht 5\' 5"  (1.651 m)   Wt 179 lb (81.2 kg)   SpO2 96%   BMI 29.79 kg/m   Wt Readings from Last 3 Encounters:  07/25/17 179 lb (81.2 kg)  07/04/17 177 lb 6.4 oz (80.5 kg)  05/23/17 183 lb 8 oz (83.2 kg)    Physical Exam  Constitutional: She is oriented to person, place, and time. She appears well-developed and well-nourished. No distress.  HENT:  Head: Normocephalic and atraumatic.  Eyes: Conjunctivae and  lids are normal. Right eye exhibits no discharge. Left eye exhibits no discharge. No scleral icterus.  Neck: Normal range of motion. Neck supple. No JVD present. Carotid bruit is not present.  Cardiovascular: Normal rate, regular rhythm and normal heart sounds.  Pulmonary/Chest: Effort normal and breath sounds normal.  Abdominal: Normal appearance. There is no splenomegaly or hepatomegaly.  Musculoskeletal: Normal range of motion.  Neurological: She is alert and oriented to person, place, and time.  Skin: Skin is warm, dry and intact. No rash noted. No pallor.  Psychiatric: She has a normal mood and affect. Her behavior is normal. Judgment and thought content normal.    Results for orders placed or performed in visit on 05/23/17  UA/M w/rflx Culture, Routine  Result Value Ref Range   Specific Gravity, UA 1.015 1.005 - 1.030   pH, UA 6.5 5.0 - 7.5   Color, UA Yellow Yellow   Appearance Ur Hazy (A) Clear   Leukocytes, UA Negative Negative   Protein, UA Negative Negative/Trace   Glucose, UA 3+ (A) Negative   Ketones, UA Negative Negative  RBC, UA Negative Negative   Bilirubin, UA Negative Negative   Urobilinogen, Ur 0.2 0.2 - 1.0 mg/dL   Nitrite, UA Negative Negative  CBC With Differential/Platelet  Result Value Ref Range   WBC 4.9 3.4 - 10.8 x10E3/uL   RBC 4.73 3.77 - 5.28 x10E6/uL   Hemoglobin 15.1 11.1 - 15.9 g/dL   Hematocrit 16.142.2 09.634.0 - 46.6 %   MCV 89 79 - 97 fL   MCH 31.9 26.6 - 33.0 pg   MCHC 35.8 (H) 31.5 - 35.7 g/dL   RDW 04.512.2 (L) 40.912.3 - 81.115.4 %   Platelets 227 150 - 379 x10E3/uL   Neutrophils 63 Not Estab. %   Lymphs 30 Not Estab. %   MID 7 Not Estab. %   Neutrophils Absolute 3.1 1.4 - 7.0 x10E3/uL   Lymphocytes Absolute 1.5 0.7 - 3.1 x10E3/uL   MID (Absolute) 0.3 0.1 - 1.6 X10E3/uL      Assessment & Plan:   Problem List Items Addressed This Visit      Unprioritized   Abdominal pain    Due to neuropathy.  Improved with Gabapentin.  Will continue to take up  to TID but typically only needs it once or twice a day      Dysthymia    Discussed and doesn't want medication at this time.  Discussed hobbies or academic work she might enjoy.            Follow up plan: Return in about 3 months (around 10/25/2017) for with Roosvelt Maserachel Lane PA.

## 2017-07-25 NOTE — Assessment & Plan Note (Signed)
Discussed and doesn't want medication at this time.  Discussed hobbies or academic work she might enjoy.

## 2017-07-25 NOTE — Assessment & Plan Note (Signed)
Due to neuropathy.  Improved with Gabapentin.  Will continue to take up to TID but typically only needs it once or twice a day

## 2017-08-25 ENCOUNTER — Ambulatory Visit: Admit: 2017-08-25 | Discharge: 2017-08-26 | Payer: PRIVATE HEALTH INSURANCE | Attending: Family | Primary: Family

## 2017-08-25 DIAGNOSIS — Z794 Long term (current) use of insulin: Secondary | ICD-10-CM

## 2017-08-25 DIAGNOSIS — E1165 Type 2 diabetes mellitus with hyperglycemia: Principal | ICD-10-CM

## 2017-08-25 LAB — HEMOGLOBIN A1C: Hemoglobin A1C: 12.1

## 2017-08-25 MED ORDER — INSULIN GLARGINE (U-100) 100 UNIT/ML (3 ML) SUBCUTANEOUS PEN
Freq: Every day | SUBCUTANEOUS | 10 refills | 0.00000 days | Status: CP
Start: 2017-08-25 — End: 2017-09-02

## 2017-08-25 MED ORDER — INSULIN U-100 REGULAR HUMAN 100 UNIT/ML INJECTION SOLUTION
11 refills | 0 days | Status: CP
Start: 2017-08-25 — End: 2017-11-25

## 2017-09-01 ENCOUNTER — Encounter
Admit: 2017-09-01 | Discharge: 2017-09-02 | Payer: PRIVATE HEALTH INSURANCE | Attending: Nutritionist | Primary: Nutritionist

## 2017-09-01 DIAGNOSIS — Z794 Long term (current) use of insulin: Secondary | ICD-10-CM

## 2017-09-01 DIAGNOSIS — E118 Type 2 diabetes mellitus with unspecified complications: Principal | ICD-10-CM

## 2017-09-02 MED ORDER — INSULIN DEGLUDEC (U-100) 100 UNIT/ML (3 ML) SUBCUTANEOUS PEN
11 refills | 0 days | Status: CP
Start: 2017-09-02 — End: ?

## 2017-09-02 MED ORDER — FLASH GLUCOSE SENSOR KIT
11 refills | 0 days | Status: CP
Start: 2017-09-02 — End: ?

## 2017-09-02 MED ORDER — FLASH GLUCOSE SCANNING READER
0 refills | 0 days | Status: CP
Start: 2017-09-02 — End: ?

## 2017-09-30 ENCOUNTER — Ambulatory Visit (INDEPENDENT_AMBULATORY_CARE_PROVIDER_SITE_OTHER): Payer: BLUE CROSS/BLUE SHIELD | Admitting: Family Medicine

## 2017-09-30 ENCOUNTER — Encounter: Payer: Self-pay | Admitting: Family Medicine

## 2017-09-30 ENCOUNTER — Ambulatory Visit: Payer: BLUE CROSS/BLUE SHIELD | Admitting: Unknown Physician Specialty

## 2017-09-30 VITALS — BP 147/103 | HR 74 | Temp 97.8°F | Ht 65.5 in | Wt 183.8 lb

## 2017-09-30 DIAGNOSIS — L732 Hidradenitis suppurativa: Secondary | ICD-10-CM

## 2017-09-30 DIAGNOSIS — E119 Type 2 diabetes mellitus without complications: Secondary | ICD-10-CM

## 2017-09-30 DIAGNOSIS — Z794 Long term (current) use of insulin: Secondary | ICD-10-CM | POA: Diagnosis not present

## 2017-09-30 DIAGNOSIS — R1084 Generalized abdominal pain: Secondary | ICD-10-CM

## 2017-09-30 DIAGNOSIS — IMO0001 Reserved for inherently not codable concepts without codable children: Secondary | ICD-10-CM

## 2017-09-30 MED ORDER — ACYCLOVIR 400 MG PO TABS: 400.0000 mg | ORAL_TABLET | Freq: Three times a day (TID) | ORAL | 1 refills | Status: DC

## 2017-09-30 MED ORDER — METFORMIN HCL 1000 MG PO TABS: 1000.0000 mg | ORAL_TABLET | Freq: Every day | ORAL | 1 refills | Status: DC

## 2017-09-30 NOTE — Progress Notes (Signed)
BP (!) 147/103 (BP Location: Right Arm, Patient Position: Sitting, Cuff Size: Normal)   Pulse 74   Temp 97.8 F (36.6 C) (Oral)   Ht 5' 5.5" (1.664 m)   Wt 183 lb 12.8 oz (83.4 kg)   SpO2 98%   BMI 30.12 kg/m    Subjective:    Patient ID: Kristen Tran, female    DOB: October 14, 1990, 27 y.o.   MRN: [id number]  HPI: Kristen NorlanderMichaiah Jerusha Tran is a 27 y.o. female  Chief Complaint  Patient presents with  . Abdominal Pain    Follow-up. Patient states no complaints. Patient states she didn't take her blood pressure medication last night.    Here today to follow up on her neuropathy. Taking 1 capsule of gabapentin 300 mg at bedtime daily which has been helping her neuropathy both in extremities and in her abdomen. Tolerating well, no reported side effects. Does not feel she needs a higher dose.   Followed by Endocrinology for severe DM with multiple complications. Still under poor control with occasional syncopal spells requiring hospital care to regulate sugars. Works hard on her diet to try and limit carb intake  Followed by Dermatology, Gen Surgery, and Plastics for hidradenitis. May be having another surgery soon.   Past Medical History:  Diagnosis Date  . Anemia   . Anxiety   . Asthma   . Atopic dermatitis   . Atypical chest pain   . Bronchopneumonia   . Depression   . Diabetes mellitus without complication (HCC)    a. Dx @ age 27.  . Gastroparesis   . GERD (gastroesophageal reflux disease)   . Hidradenitis suppurativa   . Hypertension   . Obesity   . Syncope    a. Recurrent - in setting of hypoglycemia; b. 12/2015 Echo: EF EF 60-65%, no rwma, mild conc hypertrophy w/ near cavity obliteration in systole.  Nl RV fxn.   Social History   Socioeconomic History  . Marital status: Single    Spouse name: Not on file  . Number of children: Not on file  . Years of education: Not on file  . Highest education level: Not on file  Occupational History    . Not on file  Social Needs  . Financial resource strain: Not on file  . Food insecurity:    Worry: Not on file    Inability: Not on file  . Transportation needs:    Medical: Not on file    Non-medical: Not on file  Tobacco Use  . Smoking status: Never Smoker  . Smokeless tobacco: Never Used  Substance and Sexual Activity  . Alcohol use: Yes    Alcohol/week: 0.0 - 2.0 standard drinks    Comment: on occasion  . Drug use: No  . Sexual activity: Yes  Lifestyle  . Physical activity:    Days per week: Not on file    Minutes per session: Not on file  . Stress: Not on file  Relationships  . Social connections:    Talks on phone: Not on file    Gets together: Not on file    Attends religious service: Not on file    Active member of club or organization: Not on file    Attends meetings of clubs or organizations: Not on file    Relationship status: Not on file  . Intimate partner violence:    Fear of current or ex partner: Not on file    Emotionally abused: Not on file  Physically abused: Not on file    Forced sexual activity: Not on file  Other Topics Concern  . Not on file  Social History Narrative  . Not on file   Relevant past medical, surgical, family and social history reviewed and updated as indicated. Interim medical history since our last visit reviewed. Allergies and medications reviewed and updated.  Review of Systems  Per HPI unless specifically indicated above     Objective:    BP (!) 147/103 (BP Location: Right Arm, Patient Position: Sitting, Cuff Size: Normal)   Pulse 74   Temp 97.8 F (36.6 C) (Oral)   Ht 5' 5.5" (1.664 m)   Wt 183 lb 12.8 oz (83.4 kg)   SpO2 98%   BMI 30.12 kg/m   Wt Readings from Last 3 Encounters:  09/30/17 183 lb 12.8 oz (83.4 kg)  07/25/17 179 lb (81.2 kg)  07/04/17 177 lb 6.4 oz (80.5 kg)    Physical Exam  Constitutional: She is oriented to person, place, and time. She appears well-developed and well-nourished. No  distress.  Eyes: Conjunctivae and EOM are normal.  Neck: Normal range of motion. Neck supple.  Cardiovascular: Normal rate and regular rhythm.  Pulmonary/Chest: Effort normal and breath sounds normal.  Abdominal: Soft. Bowel sounds are normal. There is no tenderness.  Musculoskeletal: Normal range of motion.  Neurological: She is alert and oriented to person, place, and time.  Skin: Skin is warm and dry.  Psychiatric: She has a normal mood and affect. Her behavior is normal.  Nursing note and vitals reviewed.   Results for orders placed or performed in visit on 09/30/17  Microalbumin, urine  Result Value Ref Range   Microalb, Ur Waived 11.8   Hemoglobin A1c  Result Value Ref Range   Hemoglobin A1C 12.1   HM DIABETES FOOT EXAM  Result Value Ref Range   HM Diabetic Foot Exam Normal       Assessment & Plan:   Problem List Items Addressed This Visit      Endocrine   Insulin dependent diabetes mellitus (HCC)    Followed by endocrinology, still not under good control. Continue working on dietary habits and medication compliance per their instruction      Relevant Medications   TRESIBA FLEXTOUCH 100 UNIT/ML SOPN FlexTouch Pen   NOVOLIN R RELION 100 UNIT/ML injection   metFORMIN (GLUCOPHAGE) 1000 MG tablet     Musculoskeletal and Integument   Hydradenitis    Followed by multiple specialists, continue current regimen        Other   Abdominal pain - Primary    Thought to be neuropathy from severe DM, improved with 300 mg gabapentin at bedtime. Continue current regimen, pt aware she can increase dose if needed          Follow up plan: Return in about 6 months (around 03/31/2018) for CPE.

## 2017-10-01 NOTE — Assessment & Plan Note (Signed)
Followed by multiple specialists, continue current regimen

## 2017-10-01 NOTE — Assessment & Plan Note (Signed)
Thought to be neuropathy from severe DM, improved with 300 mg gabapentin at bedtime. Continue current regimen, pt aware she can increase dose if needed

## 2017-10-01 NOTE — Patient Instructions (Signed)
Follow up for CPE 

## 2017-10-01 NOTE — Assessment & Plan Note (Signed)
Followed by endocrinology, still not under good control. Continue working on dietary habits and medication compliance per their instruction

## 2017-10-13 ENCOUNTER — Encounter
Admit: 2017-10-13 | Discharge: 2017-10-14 | Payer: PRIVATE HEALTH INSURANCE | Attending: Nutritionist | Primary: Nutritionist

## 2017-10-13 DIAGNOSIS — E1169 Type 2 diabetes mellitus with other specified complication: Principal | ICD-10-CM

## 2017-10-13 DIAGNOSIS — Z794 Long term (current) use of insulin: Secondary | ICD-10-CM

## 2017-10-13 MED ORDER — INSULIN ASPART (U-100) 100 UNIT/ML (3 ML) SUBCUTANEOUS PEN
12 refills | 0 days | Status: CP
Start: 2017-10-13 — End: ?

## 2017-11-25 ENCOUNTER — Encounter: Admit: 2017-11-25 | Discharge: 2017-11-26 | Payer: PRIVATE HEALTH INSURANCE | Attending: Family | Primary: Family

## 2017-11-25 DIAGNOSIS — Z794 Long term (current) use of insulin: Secondary | ICD-10-CM

## 2017-11-25 DIAGNOSIS — E1169 Type 2 diabetes mellitus with other specified complication: Principal | ICD-10-CM

## 2017-11-25 MED ORDER — METFORMIN ER 500 MG TABLET,EXTENDED RELEASE 24 HR
ORAL_TABLET | Freq: Two times a day (BID) | ORAL | 3 refills | 0.00000 days | Status: CP
Start: 2017-11-25 — End: 2018-11-25

## 2017-12-09 ENCOUNTER — Encounter
Admit: 2017-12-09 | Discharge: 2017-12-10 | Payer: PRIVATE HEALTH INSURANCE | Attending: Nutritionist | Primary: Nutritionist

## 2017-12-09 ENCOUNTER — Encounter: Payer: Self-pay | Admitting: Family Medicine

## 2017-12-09 ENCOUNTER — Encounter

## 2017-12-09 ENCOUNTER — Ambulatory Visit (INDEPENDENT_AMBULATORY_CARE_PROVIDER_SITE_OTHER): Payer: BLUE CROSS/BLUE SHIELD | Admitting: Family Medicine

## 2017-12-09 VITALS — BP 149/92 | HR 86 | Temp 98.5°F | Ht 65.5 in | Wt 189.8 lb

## 2017-12-09 DIAGNOSIS — E1169 Type 2 diabetes mellitus with other specified complication: Principal | ICD-10-CM

## 2017-12-09 DIAGNOSIS — Z794 Long term (current) use of insulin: Secondary | ICD-10-CM

## 2017-12-09 DIAGNOSIS — F5102 Adjustment insomnia: Secondary | ICD-10-CM

## 2017-12-09 DIAGNOSIS — Z23 Encounter for immunization: Secondary | ICD-10-CM

## 2017-12-09 DIAGNOSIS — F419 Anxiety disorder, unspecified: Secondary | ICD-10-CM | POA: Diagnosis not present

## 2017-12-09 MED ORDER — METOCLOPRAMIDE HCL 10 MG PO TABS: 10.0000 mg | ORAL_TABLET | Freq: Every day | ORAL | 12 refills | Status: AC | PRN

## 2017-12-09 MED ORDER — CLONAZEPAM 0.5 MG PO TABS: 0.5000 mg | ORAL_TABLET | Freq: Every evening | ORAL | 0 refills | Status: DC | PRN

## 2017-12-09 MED ORDER — METOPROLOL SUCCINATE ER 50 MG PO TB24: 50.0000 mg | ORAL_TABLET | Freq: Every day | ORAL | 1 refills | Status: DC

## 2017-12-09 MED ORDER — HYDROXYZINE HCL 25 MG PO TABS: 25.0000 mg | ORAL_TABLET | Freq: Three times a day (TID) | ORAL | 0 refills | Status: DC | PRN

## 2017-12-09 NOTE — Progress Notes (Signed)
BP (!) 149/92 (BP Location: Left Arm, Patient Position: Sitting, Cuff Size: Normal)   Pulse 86   Temp 98.5 F (36.9 C) (Oral)   Ht 5' 5.5" (1.664 m)   Wt 189 lb 12.8 oz (86.1 kg)   SpO2 100%   BMI 31.10 kg/m    Subjective:    Patient ID: Kristen Tran, female    DOB: July 19, 1990, 27 y.o.   MRN: [id number]  HPI: [name] is a 27 y.o. female  Chief Complaint  Patient presents with  . Insomnia    Patient can only stay asleep for 2 hours. Ongoing 2 weeks.  . Medication Refill    Reglan, and metoprolol   Here today with several weeks of significant trouble sleeping. Trying zzquil, nyquil, phenergan, and her normal 1 gabapentin. Also taking her normal as needed muscle relaxer with minimal relief. Getting about 2 hours of sleep nightly and waking up - sometimes due to nausea, sometimes to pee, sometimes in a sweat from a bad dream. Notes this sleep issue happens intermittently for her after stressful situations. Upon further questioning, she's going through a lot right now personally and struggling to cope. Anxious and crying often. Denies SI/HI.   Relevant past medical, surgical, family and social history reviewed and updated as indicated. Interim medical history since our last visit reviewed. Allergies and medications reviewed and updated.  Review of Systems  Per HPI unless specifically indicated above     Objective:    BP (!) 149/92 (BP Location: Left Arm, Patient Position: Sitting, Cuff Size: Normal)   Pulse 86   Temp 98.5 F (36.9 C) (Oral)   Ht 5' 5.5" (1.664 m)   Wt 189 lb 12.8 oz (86.1 kg)   SpO2 100%   BMI 31.10 kg/m   Wt Readings from Last 3 Encounters:  12/09/17 189 lb 12.8 oz (86.1 kg)  09/30/17 183 lb 12.8 oz (83.4 kg)  07/25/17 179 lb (81.2 kg)    Physical Exam  Constitutional: She is oriented to person, place, and time. She appears well-developed and well-nourished.  HENT:  Head: Atraumatic.  Eyes: Pupils are  equal, round, and reactive to light. Conjunctivae and EOM are normal.  Neck: Normal range of motion. Neck supple.  Cardiovascular: Normal rate and regular rhythm.  Pulmonary/Chest: Effort normal and breath sounds normal.  Musculoskeletal: Normal range of motion.  Neurological: She is alert and oriented to person, place, and time.  Skin: Skin is warm and dry.  Psychiatric:  Tearful, anxious  Nursing note and vitals reviewed.   Results for orders placed or performed in visit on 09/30/17  Microalbumin, urine  Result Value Ref Range   Microalb, Ur Waived 11.8   Hemoglobin A1c  Result Value Ref Range   Hemoglobin A1C 12.1   HM DIABETES FOOT EXAM  Result Value Ref Range   HM Diabetic Foot Exam Normal       Assessment & Plan:   Problem List Items Addressed This Visit      Other   Anxiety    Significant stress right now from some recent events, rare prn klonopin given for panic episodes. Hydroxyzine prn for less severe anxiety. Extensively discussed addictive potential and sedation risks. No refills, short term medication. Pt voices understanding. Counseling packet given, pt will call to schedule      Relevant Medications   hydrOXYzine (ATARAX/VISTARIL) 25 MG tablet   Insomnia - Primary    Will trial hydroxyzine at bedtime in addition to melatonin. Sleep hygiene  reviewed, and will start counseling to help with her recent stressors       Other Visit Diagnoses    Need for influenza vaccination       Relevant Orders   Flu Vaccine QUAD 6+ mos PF IM (Fluarix Quad PF) (Completed)       Follow up plan: Return in about 2 weeks (around 12/23/2017) for Insomnia, anxiety.

## 2017-12-09 NOTE — Patient Instructions (Signed)

## 2017-12-12 NOTE — Assessment & Plan Note (Addendum)
Significant stress right now from some recent events, rare prn klonopin given for panic episodes. Hydroxyzine prn for less severe anxiety. Extensively discussed addictive potential and sedation risks. No refills, short term medication. Pt voices understanding. Counseling packet given, pt will call to schedule

## 2017-12-12 NOTE — Assessment & Plan Note (Signed)
Will trial hydroxyzine at bedtime in addition to melatonin. Sleep hygiene reviewed, and will start counseling to help with her recent stressors

## 2017-12-23 ENCOUNTER — Telehealth: Payer: Self-pay | Admitting: Family Medicine

## 2017-12-23 ENCOUNTER — Encounter: Payer: Self-pay | Admitting: Family Medicine

## 2017-12-23 ENCOUNTER — Ambulatory Visit (INDEPENDENT_AMBULATORY_CARE_PROVIDER_SITE_OTHER): Payer: BLUE CROSS/BLUE SHIELD | Admitting: Family Medicine

## 2017-12-23 VITALS — BP 150/93 | HR 81 | Temp 99.1°F | Ht 65.5 in | Wt 190.9 lb

## 2017-12-23 DIAGNOSIS — I10 Essential (primary) hypertension: Secondary | ICD-10-CM

## 2017-12-23 DIAGNOSIS — F5102 Adjustment insomnia: Secondary | ICD-10-CM

## 2017-12-23 DIAGNOSIS — F419 Anxiety disorder, unspecified: Secondary | ICD-10-CM | POA: Diagnosis not present

## 2017-12-23 MED ORDER — QUETIAPINE FUMARATE 50 MG PO TABS: ORAL_TABLET | ORAL | 1 refills | Status: DC

## 2017-12-23 MED ORDER — HYDROXYZINE HCL 25 MG PO TABS: 25.0000 mg | ORAL_TABLET | Freq: Three times a day (TID) | ORAL | 1 refills | Status: DC | PRN

## 2017-12-23 MED ORDER — SUVOREXANT 10 MG PO TABS: 10.0000 mg | ORAL_TABLET | Freq: Every evening | ORAL | 0 refills | Status: DC | PRN

## 2017-12-23 MED ORDER — AMLODIPINE BESYLATE 5 MG PO TABS: 5.0000 mg | ORAL_TABLET | Freq: Every day | ORAL | 0 refills | Status: DC

## 2017-12-23 NOTE — Telephone Encounter (Signed)
Copied from CRM [id number]. Topic: Quick Communication - See Telephone Encounter >> Dec 23, 2017 11:10 AM Lorrine KinMcGee, Kathleen Tamm B, NT wrote: CRM for notification. See Telephone encounter for: 12/23/17. [facility] and states that the QUEtiapine (SEROQUEL) 50 MG tablet that was sent in today is showing that there will be an interraction with metoCLOPramide (REGLAN) 10 MG tablet. Would like a call back to clarify if the medications are okay to be taken together. Please advise.

## 2017-12-23 NOTE — Telephone Encounter (Signed)
Let's start belsomra instead - I'll get it sent over and she should be able to download a savings card on the drug website

## 2017-12-23 NOTE — Telephone Encounter (Signed)
Was on hold with pharmacy for 10+ minutes. Pharmacy not yet notified.  Patient notified and verbalized understanding. Explained to patient to call back if she had any other questions or concerns.

## 2017-12-23 NOTE — Progress Notes (Signed)
BP (!) 150/93 (BP Location: Left Arm, Patient Position: Sitting, Cuff Size: Normal)   Pulse 81   Temp 99.1 F (37.3 C) (Oral)   Ht 5' 5.5" (1.664 m)   Wt 190 lb 14.4 oz (86.6 kg)   SpO2 99%   BMI 31.28 kg/m    Subjective:    Patient ID: Kristen Tran, female    DOB: [birth year], 27 y.o.   MRN: [id number]  HPI: Kristen Tran is a 27 y.o. female  Chief Complaint  Patient presents with  . Anxiety    2 week follow-up  . Insomnia   Here today for anxiety and insomnia follow up. Still not sleeping well at all, got 5 hours one night but that was her best night. Tried klonopin a time or two and did not notice any benefit and does not wish to remain on it. Taking the hydroxyzine during the day which seems to help quite a bit for her anxiety. Has not yet reached out to a counselor. Still under significant stress with personal things going on.  Denies SI/HI.   Taking the metoprolol without side effects. Checks BP occasionally at home - getting 140s/80s-90s. Denies Cp, SOB, dizziness, HAs. Notes it's been high pretty frequently the past year or so. Tries to watch diet and stay active as much as possible.   Past Medical History:  Diagnosis Date  . Anemia   . Anxiety   . Asthma   . Atopic dermatitis   . Atypical chest pain   . Bronchopneumonia   . Depression   . Diabetes mellitus without complication (HCC)    a. Dx @ age 27.  . Gastroparesis   . GERD (gastroesophageal reflux disease)   . Hidradenitis suppurativa   . Hypertension   . Obesity   . Syncope    a. Recurrent - in setting of hypoglycemia; b. 12/2015 Echo: EF EF 60-65%, no rwma, mild conc hypertrophy w/ near cavity obliteration in systole.  Nl RV fxn.   Social History   Socioeconomic History  . Marital status: Single    Spouse name: Not on file  . Number of children: Not on file  . Years of education: Not on file  . Highest education level: Not on file  Occupational History  . Not  on file  Social Needs  . Financial resource strain: Not on file  . Food insecurity:    Worry: Not on file    Inability: Not on file  . Transportation needs:    Medical: Not on file    Non-medical: Not on file  Tobacco Use  . Smoking status: Never Smoker  . Smokeless tobacco: Never Used  Substance and Sexual Activity  . Alcohol use: Yes    Alcohol/week: 0.0 - 2.0 standard drinks    Comment: on occasion  . Drug use: No  . Sexual activity: Yes  Lifestyle  . Physical activity:    Days per week: Not on file    Minutes per session: Not on file  . Stress: Not on file  Relationships  . Social connections:    Talks on phone: Not on file    Gets together: Not on file    Attends religious service: Not on file    Active member of club or organization: Not on file    Attends meetings of clubs or organizations: Not on file    Relationship status: Not on file  . Intimate partner violence:    Fear of current or ex  partner: Not on file    Emotionally abused: Not on file    Physically abused: Not on file    Forced sexual activity: Not on file  Other Topics Concern  . Not on file  Social History Narrative  . Not on file    Relevant past medical, surgical, family and social history reviewed and updated as indicated. Interim medical history since our last visit reviewed. Allergies and medications reviewed and updated.  Review of Systems  Per HPI unless specifically indicated above     Objective:    BP (!) 150/93 (BP Location: Left Arm, Patient Position: Sitting, Cuff Size: Normal)   Pulse 81   Temp 99.1 F (37.3 C) (Oral)   Ht 5' 5.5" (1.664 m)   Wt 190 lb 14.4 oz (86.6 kg)   SpO2 99%   BMI 31.28 kg/m   Wt Readings from Last 3 Encounters:  12/23/17 190 lb 14.4 oz (86.6 kg)  12/09/17 189 lb 12.8 oz (86.1 kg)  09/30/17 183 lb 12.8 oz (83.4 kg)    Physical Exam Vitals signs and nursing note reviewed.  Constitutional:      Appearance: Normal appearance. She is not  ill-appearing.  HENT:     Head: Atraumatic.  Eyes:     Extraocular Movements: Extraocular movements intact.     Conjunctiva/sclera: Conjunctivae normal.  Neck:     Musculoskeletal: Normal range of motion and neck supple.  Cardiovascular:     Rate and Rhythm: Normal rate and regular rhythm.     Heart sounds: Normal heart sounds.  Pulmonary:     Effort: Pulmonary effort is normal.     Breath sounds: Normal breath sounds.  Musculoskeletal: Normal range of motion.  Skin:    General: Skin is warm and dry.  Neurological:     Mental Status: She is alert and oriented to person, place, and time.  Psychiatric:        Mood and Affect: Mood normal.        Thought Content: Thought content normal.        Judgment: Judgment normal.     Results for orders placed or performed in visit on 09/30/17  Microalbumin, urine  Result Value Ref Range   Microalb, Ur Waived 11.8   Hemoglobin A1c  Result Value Ref Range   Hemoglobin A1C 12.1   HM DIABETES FOOT EXAM  Result Value Ref Range   HM Diabetic Foot Exam Normal       Assessment & Plan:   Problem List Items Addressed This Visit      Cardiovascular and Mediastinum   Hypertension    Will add amlodipine and continue home monitoring. Recheck at upcoming f/u. DASH diet, exercise, stress reduction      Relevant Medications   amLODipine (NORVASC) 5 MG tablet     Other   Anxiety - Primary    Doing well on prn hydroxyzine, and does not want to start a daily medication at this time. Will consider counseling further.      Relevant Medications   hydrOXYzine (ATARAX/VISTARIL) 25 MG tablet   Insomnia    Will trial belsomra prn for sleep given persistent significant issues. She seems to only have insomnia issues during significant stressors in her life, will continue to encourage counseling to help with coping with these stressors and continue sleep hygiene          Follow up plan: Return in about 4 weeks (around 01/20/2018) for Sleep,  anxiety f/u.

## 2017-12-24 NOTE — Telephone Encounter (Signed)
Pharmacy notified of medication change

## 2017-12-25 NOTE — Telephone Encounter (Addendum)
Prior authorization for Belsomra was initiated via [email or web address]. Key: [id number] Spoke with patient. She does have savings card, however, they had to order medication so she has not yet tried to use the savings card.    If denied preferred medications are:   Eszopiclone  Zaleplon  Zolpidem

## 2017-12-28 NOTE — Assessment & Plan Note (Signed)
Will add amlodipine and continue home monitoring. Recheck at upcoming f/u. DASH diet, exercise, stress reduction

## 2017-12-28 NOTE — Assessment & Plan Note (Addendum)
Will trial belsomra prn for sleep given persistent significant issues. She seems to only have insomnia issues during significant stressors in her life, will continue to encourage counseling to help with coping with these stressors and continue sleep hygiene

## 2017-12-28 NOTE — Assessment & Plan Note (Signed)
Doing well on prn hydroxyzine, and does not want to start a daily medication at this time. Will consider counseling further.

## 2017-12-29 NOTE — Telephone Encounter (Signed)
PA for Belsomra approved.

## 2018-01-23 ENCOUNTER — Ambulatory Visit: Payer: BLUE CROSS/BLUE SHIELD | Admitting: Family Medicine

## 2018-01-23 ENCOUNTER — Encounter: Payer: Self-pay | Admitting: Family Medicine

## 2018-01-23 VITALS — BP 154/87 | HR 96 | Temp 98.6°F | Wt 187.8 lb

## 2018-01-23 DIAGNOSIS — I10 Essential (primary) hypertension: Secondary | ICD-10-CM

## 2018-01-23 DIAGNOSIS — F419 Anxiety disorder, unspecified: Secondary | ICD-10-CM

## 2018-01-23 DIAGNOSIS — F5102 Adjustment insomnia: Secondary | ICD-10-CM

## 2018-01-23 MED ORDER — AMLODIPINE BESYLATE 5 MG PO TABS: 5.0000 mg | ORAL_TABLET | Freq: Every day | ORAL | 1 refills | Status: DC

## 2018-01-23 MED ORDER — HYDROXYZINE HCL 25 MG PO TABS: 25.0000 mg | ORAL_TABLET | Freq: Three times a day (TID) | ORAL | 1 refills | Status: DC | PRN

## 2018-01-23 MED ORDER — SUVOREXANT 10 MG PO TABS: 10.0000 mg | ORAL_TABLET | Freq: Every evening | ORAL | 5 refills | Status: DC | PRN

## 2018-01-23 NOTE — Assessment & Plan Note (Signed)
Above average in clinic today, but typically WNL at home. Continue current regimen and call with persistent abnormal readings at home. Recheck at upcoming f/u.

## 2018-01-23 NOTE — Assessment & Plan Note (Signed)
Excellent improvement with belsomra. Continue current regimen as well as sleep hygiene

## 2018-01-23 NOTE — Progress Notes (Signed)
BP (!) 154/87   Pulse 96   Temp 98.6 F (37 C) (Oral)   Wt 187 lb 12.8 oz (85.2 kg)   LMP 12/25/2017 (Exact Date)   SpO2 99%   BMI 30.78 kg/m    Subjective:    Patient ID: Kristen Tran, female    DOB: 1991/01/05, 28 y.o.   MRN: [id number]  HPI: Kristen NorlanderMichaiah Jerusha Tran is a 28 y.o. female  Chief Complaint  Patient presents with  . Hypertension  . Anxiety  . Insomnia   Here today for 1 month f/u.   HTN - added 5 mg amlodipine at last visit. Tolerating well without side effects and taking faithfully in addition to metoprolol. Home BPs 120s-130s/80s-90s. Denies HAs, dizziness, Cp, SOB.   Taking hydroxyzine prn for anxiety with excellent control. Denies concerns there.   Started belsomra for insomnia and doing very well on it. States she is very relaxed with it and it keeps her asleep which nothing else has seemed to help with that. Denies side effects including daytime somnolence.   Past Medical History:  Diagnosis Date  . Anemia   . Anxiety   . Asthma   . Atopic dermatitis   . Atypical chest pain   . Bronchopneumonia   . Depression   . Diabetes mellitus without complication (HCC)    a. Dx @ age 28.  . Gastroparesis   . GERD (gastroesophageal reflux disease)   . Hidradenitis suppurativa   . Hypertension   . Obesity   . Syncope    a. Recurrent - in setting of hypoglycemia; b. 12/2015 Echo: EF EF 60-65%, no rwma, mild conc hypertrophy w/ near cavity obliteration in systole.  Nl RV fxn.   Social History   Socioeconomic History  . Marital status: Single    Spouse name: Not on file  . Number of children: Not on file  . Years of education: Not on file  . Highest education level: Not on file  Occupational History  . Not on file  Social Needs  . Financial resource strain: Not on file  . Food insecurity:    Worry: Not on file    Inability: Not on file  . Transportation needs:    Medical: Not on file    Non-medical: Not on file    Tobacco Use  . Smoking status: Never Smoker  . Smokeless tobacco: Never Used  Substance and Sexual Activity  . Alcohol use: Yes    Alcohol/week: 0.0 - 2.0 standard drinks    Comment: on occasion  . Drug use: No  . Sexual activity: Yes  Lifestyle  . Physical activity:    Days per week: Not on file    Minutes per session: Not on file  . Stress: Not on file  Relationships  . Social connections:    Talks on phone: Not on file    Gets together: Not on file    Attends religious service: Not on file    Active member of club or organization: Not on file    Attends meetings of clubs or organizations: Not on file    Relationship status: Not on file  . Intimate partner violence:    Fear of current or ex partner: Not on file    Emotionally abused: Not on file    Physically abused: Not on file    Forced sexual activity: Not on file  Other Topics Concern  . Not on file  Social History Narrative  . Not on file  Relevant past medical, surgical, family and social history reviewed and updated as indicated. Interim medical history since our last visit reviewed. Allergies and medications reviewed and updated.  Review of Systems  Per HPI unless specifically indicated above     Objective:    BP (!) 154/87   Pulse 96   Temp 98.6 F (37 C) (Oral)   Wt 187 lb 12.8 oz (85.2 kg)   LMP 12/25/2017 (Exact Date)   SpO2 99%   BMI 30.78 kg/m   Wt Readings from Last 3 Encounters:  01/23/18 187 lb 12.8 oz (85.2 kg)  12/23/17 190 lb 14.4 oz (86.6 kg)  12/09/17 189 lb 12.8 oz (86.1 kg)    Physical Exam Vitals signs and nursing note reviewed.  Constitutional:      Appearance: Normal appearance. She is not ill-appearing.  HENT:     Head: Atraumatic.  Eyes:     Extraocular Movements: Extraocular movements intact.     Conjunctiva/sclera: Conjunctivae normal.  Neck:     Musculoskeletal: Normal range of motion and neck supple.  Cardiovascular:     Rate and Rhythm: Normal rate and  regular rhythm.     Heart sounds: Normal heart sounds.  Pulmonary:     Effort: Pulmonary effort is normal.     Breath sounds: Normal breath sounds.  Musculoskeletal: Normal range of motion.  Skin:    General: Skin is warm and dry.  Neurological:     Mental Status: She is alert and oriented to person, place, and time.  Psychiatric:        Mood and Affect: Mood normal.        Thought Content: Thought content normal.        Judgment: Judgment normal.     Results for orders placed or performed in visit on 09/30/17  Microalbumin, urine  Result Value Ref Range   Microalb, Ur Waived 11.8   Hemoglobin A1c  Result Value Ref Range   Hemoglobin A1C 12.1   HM DIABETES FOOT EXAM  Result Value Ref Range   HM Diabetic Foot Exam Normal       Assessment & Plan:   Problem List Items Addressed This Visit      Cardiovascular and Mediastinum   Hypertension - Primary    Above average in clinic today, but typically WNL at home. Continue current regimen and call with persistent abnormal readings at home. Recheck at upcoming f/u.       Relevant Medications   amLODipine (NORVASC) 5 MG tablet     Other   Anxiety    Stable on prn hydroxyzine. Continue current regimen      Relevant Medications   hydrOXYzine (ATARAX/VISTARIL) 25 MG tablet   Insomnia    Excellent improvement with belsomra. Continue current regimen as well as sleep hygiene          Follow up plan: Return in about 6 months (around 07/24/2018) for BP, sleep f/u.

## 2018-01-23 NOTE — Assessment & Plan Note (Signed)
Stable on prn hydroxyzine. Continue current regimen

## 2018-01-30 ENCOUNTER — Telehealth: Payer: Self-pay | Admitting: Family Medicine

## 2018-01-30 NOTE — Telephone Encounter (Signed)
New issue, needs appt for eval  Copied from CRM [id number]. Topic: General - Other >> Jan 30, 2018 11:23 AM Arlyss Gandy, NT wrote: Reason for CRM: Pt was seen on 1/17 but now may have pink eye and would like to see if the dr can send in eye drops?

## 2018-01-30 NOTE — Telephone Encounter (Signed)
Spoke with pt and scheduled an appt for Monday  Because she didn't have transportation for today, just in case it gets worse. Also reminded pt that she could try to do an e visit.

## 2018-02-02 ENCOUNTER — Ambulatory Visit: Payer: BLUE CROSS/BLUE SHIELD | Admitting: Family Medicine

## 2018-02-09 ENCOUNTER — Other Ambulatory Visit: Payer: Self-pay | Admitting: Family Medicine

## 2018-02-10 NOTE — Telephone Encounter (Signed)
Requested medication (s) are due for refill today: no  Requested medication (s) are on the active medication list: yes  Last refill:  ?  Future visit scheduled: yes  Notes to clinic:  Historical provider    Requested Prescriptions  Pending Prescriptions Disp Refills   acyclovir (ZOVIRAX) 400 MG tablet [Pharmacy Med Name: ACYCLOVIR 400MG  TABLETS] 90 tablet     Sig: TAKE 1 TABLET(400 MG) BY MOUTH THREE TIMES DAILY     Antimicrobials:  Antiviral Agents - Anti-Herpetic Passed - 02/09/2018  5:43 PM      Passed - Valid encounter within last 12 months    Recent Outpatient Visits          2 weeks ago Essential hypertension   [facility] Particia [place], [place]   1 month ago Anxiety   [facility] Roosvelt Maser [place], [place]   2 months ago Adjustment insomnia   [facility] Roosvelt Maser [place], [place]   4 months ago Generalized abdominal pain   [facility] Roosvelt Maser [place], [place]   6 months ago Generalized abdominal pain   [facility] Gabriel Cirri, NP      Future Appointments            In 5 months Maurice March, Salley Hews, PA-C [facility], [facility]

## 2018-03-03 ENCOUNTER — Encounter: Admit: 2018-03-03 | Discharge: 2018-03-04 | Payer: PRIVATE HEALTH INSURANCE | Attending: Family | Primary: Family

## 2018-03-03 DIAGNOSIS — K3184 Gastroparesis: Secondary | ICD-10-CM

## 2018-03-03 DIAGNOSIS — E1143 Type 2 diabetes mellitus with diabetic autonomic (poly)neuropathy: Secondary | ICD-10-CM

## 2018-03-03 DIAGNOSIS — E1169 Type 2 diabetes mellitus with other specified complication: Principal | ICD-10-CM

## 2018-03-03 DIAGNOSIS — Z794 Long term (current) use of insulin: Secondary | ICD-10-CM

## 2018-03-03 DIAGNOSIS — I10 Essential (primary) hypertension: Secondary | ICD-10-CM

## 2018-03-03 DIAGNOSIS — E1165 Type 2 diabetes mellitus with hyperglycemia: Secondary | ICD-10-CM

## 2018-03-31 ENCOUNTER — Ambulatory Visit: Payer: BLUE CROSS/BLUE SHIELD | Admitting: Family Medicine

## 2018-04-07 ENCOUNTER — Encounter
Admit: 2018-04-07 | Discharge: 2018-04-08 | Payer: PRIVATE HEALTH INSURANCE | Attending: Nutritionist | Primary: Nutritionist

## 2018-04-13 ENCOUNTER — Encounter
Admit: 2018-04-13 | Discharge: 2018-04-14 | Payer: PRIVATE HEALTH INSURANCE | Attending: Nutritionist | Primary: Nutritionist

## 2018-04-24 ENCOUNTER — Telehealth: Payer: BLUE CROSS/BLUE SHIELD | Admitting: Internal Medicine

## 2018-05-20 ENCOUNTER — Ambulatory Visit: Payer: BLUE CROSS/BLUE SHIELD | Admitting: Family Medicine

## 2018-05-21 ENCOUNTER — Ambulatory Visit: Payer: BLUE CROSS/BLUE SHIELD | Admitting: Family Medicine

## 2018-05-21 ENCOUNTER — Encounter: Payer: Self-pay | Admitting: Family Medicine

## 2018-05-21 ENCOUNTER — Other Ambulatory Visit: Payer: Self-pay

## 2018-05-21 VITALS — BP 172/133 | HR 90 | Temp 99.6°F | Ht 65.5 in | Wt 189.0 lb

## 2018-05-21 DIAGNOSIS — N644 Mastodynia: Secondary | ICD-10-CM | POA: Diagnosis not present

## 2018-05-21 DIAGNOSIS — Z3041 Encounter for surveillance of contraceptive pills: Secondary | ICD-10-CM | POA: Diagnosis not present

## 2018-05-21 MED ORDER — NORETHINDRONE 0.35 MG PO TABS: 1.0000 | ORAL_TABLET | Freq: Every day | ORAL | 4 refills | Status: DC

## 2018-05-21 NOTE — Progress Notes (Signed)
BP (!) 172/133   Pulse 90   Temp 99.6 F (37.6 C) (Oral)   Ht 5' 5.5" (1.664 m)   Wt 189 lb (85.7 kg)   SpO2 99%   BMI 30.97 kg/m    Subjective:    Patient ID: Kristen Tran, female    DOB: [birth year], 28 y.o.   MRN: [id number]  HPI: Kristen Tran is a 28 y.o. female  Chief Complaint  Patient presents with  . Breast Mass    right breast. states pain started 2 months ago. states pop up the lupm about 2 weeks ago. sore all the time.  . Medication Refill    norethindrone   Here today for right breast pain x 2 months. Noticed some redness, swelling, and drainage about 2 weeks ago and since the swelling has come down. Still having tenderness in the area that concerns her. Has not paid much attention to if the issue is worsening certain times of the month to connect to menstrual cycle/hormone fluctuations. Denies fevers, chills, sweats. Of note, has a hx of hidradenitis for which she's on daily doxycycline. This is followed by Dermatology. No fhx of breast issues she's aware of.   Relevant past medical, surgical, family and social history reviewed and updated as indicated. Interim medical history since our last visit reviewed. Allergies and medications reviewed and updated.  Review of Systems  Per HPI unless specifically indicated above     Objective:    BP (!) 172/133   Pulse 90   Temp 99.6 F (37.6 C) (Oral)   Ht 5' 5.5" (1.664 m)   Wt 189 lb (85.7 kg)   SpO2 99%   BMI 30.97 kg/m   Wt Readings from Last 3 Encounters:  05/21/18 189 lb (85.7 kg)  01/23/18 187 lb 12.8 oz (85.2 kg)  12/23/17 190 lb 14.4 oz (86.6 kg)    Physical Exam Vitals signs and nursing note reviewed.  Constitutional:      Appearance: Normal appearance. She is not ill-appearing.  HENT:     Head: Atraumatic.  Eyes:     Extraocular Movements: Extraocular movements intact.     Conjunctiva/sclera: Conjunctivae normal.  Neck:     Musculoskeletal: Normal range  of motion and neck supple.  Cardiovascular:     Rate and Rhythm: Normal rate and regular rhythm.     Heart sounds: Normal heart sounds.  Pulmonary:     Effort: Pulmonary effort is normal.     Breath sounds: Normal breath sounds.  Chest:    Musculoskeletal: Normal range of motion.  Skin:    General: Skin is warm and dry.  Neurological:     Mental Status: She is alert and oriented to person, place, and time.  Psychiatric:        Mood and Affect: Mood normal.        Thought Content: Thought content normal.        Judgment: Judgment normal.     Results for orders placed or performed in visit on 09/30/17  Microalbumin, urine  Result Value Ref Range   Microalb, Ur Waived 11.8   Hemoglobin A1c  Result Value Ref Range   Hemoglobin A1C 12.1   HM DIABETES FOOT EXAM  Result Value Ref Range   HM Diabetic Foot Exam Normal       Assessment & Plan:   Problem List Items Addressed This Visit    None    Visit Diagnoses    Breast pain, right    -  Primary   Suspect infected cyst, but given persistence of pain and concern will order imaging. Continue chronic doxy for hidradenitis. Return precautions given   Relevant Orders   US BREAST LTD UNI RIGHT INC AXILLA   MM DIAG BREAST TOMO BILATERAL   Encounter for surveillance of contraceptive pills       Relevant Medications   norethindrone (ORTHO MICRONOR) 0.35 MG tablet       Follow up plan: Return for as scheduled.

## 2018-06-03 ENCOUNTER — Ambulatory Visit
Admission: RE | Admit: 2018-06-03 | Discharge: 2018-06-03 | Disposition: A | Discharge status: Home / Self Care | Payer: BLUE CROSS/BLUE SHIELD | Source: Ambulatory Visit | Attending: Family Medicine | Admitting: Family Medicine

## 2018-06-03 ENCOUNTER — Other Ambulatory Visit: Payer: Self-pay

## 2018-06-03 DIAGNOSIS — N644 Mastodynia: Secondary | ICD-10-CM | POA: Insufficient documentation

## 2018-06-21 ENCOUNTER — Other Ambulatory Visit: Payer: Self-pay | Admitting: Unknown Physician Specialty

## 2018-06-26 ENCOUNTER — Encounter: Payer: Self-pay | Admitting: Family Medicine

## 2018-06-26 ENCOUNTER — Other Ambulatory Visit: Payer: Self-pay

## 2018-06-26 ENCOUNTER — Ambulatory Visit: Payer: BLUE CROSS/BLUE SHIELD | Admitting: Family Medicine

## 2018-06-26 VITALS — BP 179/110 | HR 85 | Temp 99.0°F | Ht 65.5 in | Wt 179.0 lb

## 2018-06-26 DIAGNOSIS — I10 Essential (primary) hypertension: Secondary | ICD-10-CM | POA: Diagnosis not present

## 2018-06-26 DIAGNOSIS — L732 Hidradenitis suppurativa: Secondary | ICD-10-CM

## 2018-06-26 MED ORDER — AMLODIPINE BESYLATE 5 MG PO TABS: 5.0000 mg | ORAL_TABLET | Freq: Every day | ORAL | 1 refills | Status: DC

## 2018-06-26 MED ORDER — ONDANSETRON 4 MG PO TBDP: 4.0000 mg | ORAL_TABLET | Freq: Three times a day (TID) | ORAL | 0 refills | Status: DC | PRN

## 2018-06-26 MED ORDER — SULFAMETHOXAZOLE-TRIMETHOPRIM 800-160 MG PO TABS: 1.0000 | ORAL_TABLET | Freq: Two times a day (BID) | ORAL | 0 refills | Status: DC

## 2018-06-26 NOTE — Progress Notes (Signed)
BP (!) 179/110   Pulse 85   Temp 99 F (37.2 C)   Ht 5' 5.5" (1.664 m)   Wt 179 lb (81.2 kg)   SpO2 99%   BMI 29.33 kg/m    Subjective:    Patient ID: Kristen Tran, female    DOB: 1990-05-10, 28 y.o.   MRN: [id number]  HPI: Kristen Tran is a 28 y.o. female  Chief Complaint  Patient presents with  . Skin Problem     abscesses around vaginal area. first noticed around a year ago. gotten worse   . Breast swelling    right side. x 3 days. states feels very tender  . Medication Refill    zofran, amlodipine   Patient presenting with a hidradenitis flare in multiple areas. Vaginal abscess of right labia off and on for over a year now, followed by GYN, Dermatology, and plastic surgery for this. Now having another one come up on the left side of the vagina x almost 2 weeks, intermittent flares of right breast abscess, and buttock abscess x 1 week. On doxycycline daily which typically takes care of these. New vaginal abscess started draining last night and swelled up significantly. Denies fevers, chills, sweats, body aches.   Home BPs have been 130s/90s when checked. Taking her medications faithfully. Feels her current BP spike is from pain from her HS. Denies CP, SOB, HAs, dizziness.   Relevant past medical, surgical, family and social history reviewed and updated as indicated. Interim medical history since our last visit reviewed. Allergies and medications reviewed and updated.  Review of Systems  Per HPI unless specifically indicated above     Objective:    BP (!) 179/110   Pulse 85   Temp 99 F (37.2 C)   Ht 5' 5.5" (1.664 m)   Wt 179 lb (81.2 kg)   SpO2 99%   BMI 29.33 kg/m   Wt Readings from Last 3 Encounters:  06/26/18 179 lb (81.2 kg)  05/21/18 189 lb (85.7 kg)  01/23/18 187 lb 12.8 oz (85.2 kg)    Physical Exam Vitals signs and nursing note reviewed.  Constitutional:      Appearance: Normal appearance. She is not  ill-appearing.  HENT:     Head: Atraumatic.  Eyes:     Extraocular Movements: Extraocular movements intact.     Conjunctiva/sclera: Conjunctivae normal.  Neck:     Musculoskeletal: Normal range of motion and neck supple.  Cardiovascular:     Rate and Rhythm: Normal rate and regular rhythm.     Heart sounds: Normal heart sounds.  Pulmonary:     Effort: Pulmonary effort is normal.     Breath sounds: Normal breath sounds.  Musculoskeletal: Normal range of motion.  Skin:    General: Skin is warm and dry.     Comments: Multiple firm hidradenitis abscesses, b/l labial, left buttock, right breast around 10-11 o clock. No redness, heat, drainage currently with any of these  Neurological:     Mental Status: She is alert and oriented to person, place, and time.  Psychiatric:        Mood and Affect: Mood normal.        Thought Content: Thought content normal.        Judgment: Judgment normal.     Results for orders placed or performed in visit on 09/30/17  Microalbumin, urine  Result Value Ref Range   Microalb, Ur Waived 11.8   Hemoglobin A1c  Result Value Ref Range  Hemoglobin A1C 12.1   HM DIABETES FOOT EXAM  Result Value Ref Range   HM Diabetic Foot Exam Normal       Assessment & Plan:   Problem List Items Addressed This Visit      Cardiovascular and Mediastinum   Hypertension    Elevated today, but WNL at home. She also notes she's been out of amlodipine for a few days when asked. Will refill and restart amlodipine. Call with persistent abnormal readings      Relevant Medications   amLODipine (NORVASC) 5 MG tablet     Musculoskeletal and Integument   Hydradenitis - Primary    Will add bactrim for further coverage given worsening and new HS abscesses popping up. Discussed to f/u with Dermatology as soon as able for further management          Follow up plan: Return for as scheduled.

## 2018-07-01 ENCOUNTER — Encounter: Payer: Self-pay | Admitting: Family Medicine

## 2018-07-01 NOTE — Assessment & Plan Note (Signed)
Will add bactrim for further coverage given worsening and new HS abscesses popping up. Discussed to f/u with Dermatology as soon as able for further management

## 2018-07-01 NOTE — Assessment & Plan Note (Signed)
Elevated today, but WNL at home. She also notes she's been out of amlodipine for a few days when asked. Will refill and restart amlodipine. Call with persistent abnormal readings

## 2018-07-07 ENCOUNTER — Other Ambulatory Visit: Payer: Self-pay | Admitting: Unknown Physician Specialty

## 2018-07-24 ENCOUNTER — Ambulatory Visit (INDEPENDENT_AMBULATORY_CARE_PROVIDER_SITE_OTHER): Payer: BLUE CROSS/BLUE SHIELD | Admitting: Family Medicine

## 2018-07-24 ENCOUNTER — Other Ambulatory Visit: Payer: Self-pay

## 2018-07-24 ENCOUNTER — Encounter: Payer: Self-pay | Admitting: Family Medicine

## 2018-07-24 VITALS — BP 139/87 | HR 87 | Temp 98.8°F | Ht 65.5 in | Wt 175.0 lb

## 2018-07-24 DIAGNOSIS — E7800 Pure hypercholesterolemia, unspecified: Secondary | ICD-10-CM

## 2018-07-24 DIAGNOSIS — IMO0001 Reserved for inherently not codable concepts without codable children: Secondary | ICD-10-CM

## 2018-07-24 DIAGNOSIS — Z794 Long term (current) use of insulin: Secondary | ICD-10-CM

## 2018-07-24 DIAGNOSIS — I10 Essential (primary) hypertension: Secondary | ICD-10-CM | POA: Diagnosis not present

## 2018-07-24 DIAGNOSIS — E119 Type 2 diabetes mellitus without complications: Secondary | ICD-10-CM | POA: Diagnosis not present

## 2018-07-24 DIAGNOSIS — F5102 Adjustment insomnia: Secondary | ICD-10-CM | POA: Diagnosis not present

## 2018-07-24 DIAGNOSIS — L732 Hidradenitis suppurativa: Secondary | ICD-10-CM

## 2018-07-24 DIAGNOSIS — J3089 Other allergic rhinitis: Secondary | ICD-10-CM

## 2018-07-24 MED ORDER — SULFAMETHOXAZOLE-TRIMETHOPRIM 800-160 MG PO TABS: 1.0000 | ORAL_TABLET | Freq: Two times a day (BID) | ORAL | 0 refills | Status: DC

## 2018-07-24 MED ORDER — METOPROLOL SUCCINATE ER 50 MG PO TB24: 50.0000 mg | ORAL_TABLET | Freq: Every day | ORAL | 1 refills | Status: DC

## 2018-07-24 NOTE — Progress Notes (Signed)
BP 139/87   Pulse 87   Temp 98.8 F (37.1 C) (Oral)   Ht 5' 5.5" (1.664 m)   Wt 175 lb (79.4 kg)   SpO2 98%   BMI 28.68 kg/m    Subjective:    Patient ID: Kristen Tran, female    DOB: Oct 26, 1990, 28 y.o.   MRN: [id number]  HPI: Kristen NorlanderMichaiah Jerusha Tran is a 28 y.o. female  Chief Complaint  Patient presents with  . Hypertension    6975m f/u  . Insomnia   Presenting today for 6 month f/u.   Hidradenitis - Most of her abscesses have gone down significantly with bactrim course, but has a new area on her vagina that has come up 5 days ago. Takes chronic doxycycline per Dermatology for Hidradenitis and uses compresses and topical antiseptics on these areas. Has been having trouble getting back in with Dermatology with COVID precautions the past few months. Denies fevers, chills, active drainage.   Home BPs have been 130s/80s, tolerating medications well. Denies CP, SOB, HAs, dizziness.   Sleep - having good days and bad days, more good days than bad. Taking belsomra prn without side effects. Hydroxyzine prn for anxiety is going well for her.   HLD - Diet controlled currently, had very high LDL last year at 181, has not had it rechecked since this time but is worried about it. Eats very healthy, stays active.   DM - followed by Endocrinology, has been having to do virtual visits the past few months and is overdue for A1C. Requesting draw here while she's already getting labs.   Allergies - under good control with singulair and antihistamine regimen. No exacerbations recently.   Dep[facility] 2/9 07/25/2017 08/16/2016 05/07/2016  Decreased Interest 1 3 2   Down, Depressed, Hopeless 1 3 2   PHQ - 2 Score 2 6 4   Altered sleeping 3 3 3   Tired, decreased energy 1 3 2   Change in appetite 1 3 3   Feeling bad or failure about yourself  1 2 3   Trouble concentrating 1 0 0  Moving slowly or fidgety/restless 1 0 1  Suicidal thoughts 0 0 0  PHQ-9 Score 10 17 16     GAD 7 : Generalized Anxiety Score 07/24/2018 12/23/2017  Nervous, Anxious, on Edge 1 2  Control/stop worrying 0 3  Worry too much - different things 1 2  Trouble relaxing 1 3  Restless 2 1  Easily annoyed or irritable 1 3  Afraid - awful might happen 1 3  Total GAD 7 Score 7 17  Anxiety Difficulty - Not difficult at all   Relevant past medical, surgical, family and social history reviewed and updated as indicated. Interim medical history since our last visit reviewed. Allergies and medications reviewed and updated.  Review of Systems  Per HPI unless specifically indicated above     Objective:    BP 139/87   Pulse 87   Temp 98.8 F (37.1 C) (Oral)   Ht 5' 5.5" (1.664 m)   Wt 175 lb (79.4 kg)   SpO2 98%   BMI 28.68 kg/m   Wt Readings from Last 3 Encounters:  07/24/18 175 lb (79.4 kg)  06/26/18 179 lb (81.2 kg)  05/21/18 189 lb (85.7 kg)    Physical Exam Vitals signs and nursing note reviewed.  Constitutional:      Appearance: Normal appearance. She is not ill-appearing.  HENT:     Head: Atraumatic.  Eyes:     Extraocular Movements: Extraocular  movements intact.     Conjunctiva/sclera: Conjunctivae normal.  Neck:     Musculoskeletal: Normal range of motion and neck supple.  Cardiovascular:     Rate and Rhythm: Normal rate and regular rhythm.     Heart sounds: Normal heart sounds.  Pulmonary:     Effort: Pulmonary effort is normal.     Breath sounds: Normal breath sounds.  Musculoskeletal: Normal range of motion.  Skin:    General: Skin is warm and dry.  Neurological:     Mental Status: She is alert and oriented to person, place, and time.  Psychiatric:        Mood and Affect: Mood normal.        Thought Content: Thought content normal.        Judgment: Judgment normal.     Results for orders placed or performed in visit on 07/24/18  Lipid Panel w/o Chol/HDL Ratio  Result Value Ref Range   Cholesterol, Total 197 100 - 199 mg/dL   Triglycerides 409149 0 -  149 mg/dL   HDL 30 (L) >81>39 mg/dL   VLDL Cholesterol Cal 30 5 - 40 mg/dL   LDL Calculated 191137 (H) 0 - 99 mg/dL  Comprehensive metabolic panel  Result Value Ref Range   Glucose 327 (H) 65 - 99 mg/dL   BUN 10 6 - 20 mg/dL   Creatinine, Ser 4.780.52 (L) 0.57 - 1.00 mg/dL   GFR calc non Af Amer 131 >59 mL/min/1.73   GFR calc Af Amer 151 >59 mL/min/1.73   BUN/Creatinine Ratio 19 9 - 23   Sodium 137 134 - 144 mmol/L   Potassium 4.4 3.5 - 5.2 mmol/L   Chloride 102 96 - 106 mmol/L   CO2 22 20 - 29 mmol/L   Calcium 8.8 8.7 - 10.2 mg/dL   Total Protein 6.4 6.0 - 8.5 g/dL   Albumin 4.2 3.9 - 5.0 g/dL   Globulin, Total 2.2 1.5 - 4.5 g/dL   Albumin/Globulin Ratio 1.9 1.2 - 2.2   Bilirubin Total 0.3 0.0 - 1.2 mg/dL   Alkaline Phosphatase 64 39 - 117 IU/L   AST 7 0 - 40 IU/L   ALT 9 0 - 32 IU/L  HgB A1c  Result Value Ref Range   Hgb A1c MFr Bld 11.3 (H) 4.8 - 5.6 %   Est. average glucose Bld gHb Est-mCnc 278 mg/dL      Assessment & Plan:   Problem List Items Addressed This Visit      Cardiovascular and Mediastinum   Hypertension - Primary    BPs stable and WNL, continue current regimen      Relevant Medications   metoprolol succinate (TOPROL-XL) 50 MG 24 hr tablet   Other Relevant Orders   Comprehensive metabolic panel (Completed)     Respiratory   Allergic rhinitis, seasonal    Stable and under good control, continue current regimen        Endocrine   Insulin dependent diabetes mellitus (HCC)    Managed by Endocrinology, will defer to their plan and draw A1C while we have her here getting labs.       Relevant Orders   HgB A1c (Completed)     Musculoskeletal and Integument   Hydradenitis    Will start another round of bactrim given new abscess, continue doxycycline per Dermatology and work on getting back in with specialists for further management        Other   Hyperlipidemia    Recheck lipids, treat if  still elevated. Long discussion today about long term CV risks,  particularly in setting of poorly controlled DM. She is agreeable to starting medication if needed      Relevant Medications   metoprolol succinate (TOPROL-XL) 50 MG 24 hr tablet   Other Relevant Orders   Lipid Panel w/o Chol/HDL Ratio (Completed)   Insomnia    Under good control with belsomra prn, continue current regimen          Follow up plan: Return in about 6 months (around 01/24/2019) for BP, Chol, sleep .

## 2018-07-25 LAB — LIPID PANEL W/O CHOL/HDL RATIO
Cholesterol, Total: 197 mg/dL (ref 100–199)
HDL: 30 mg/dL — ABNORMAL LOW (ref >39)
LDL Calculated: 137 mg/dL — ABNORMAL HIGH (ref 0–99)
Triglycerides: 149 mg/dL (ref 0–149)
VLDL Cholesterol Cal: 30 mg/dL (ref 5–40)

## 2018-07-25 LAB — COMPREHENSIVE METABOLIC PANEL
ALT: 9 IU/L (ref 0–32)
AST: 7 IU/L (ref 0–40)
Albumin/Globulin Ratio: 1.9 (ref 1.2–2.2)
Albumin: 4.2 g/dL (ref 3.9–5.0)
Alkaline Phosphatase: 64 IU/L (ref 39–117)
BUN/Creatinine Ratio: 19 (ref 9–23)
BUN: 10 mg/dL (ref 6–20)
Bilirubin Total: 0.3 mg/dL (ref 0.0–1.2)
CO2: 22 mmol/L (ref 20–29)
Calcium: 8.8 mg/dL (ref 8.7–10.2)
Chloride: 102 mmol/L (ref 96–106)
Creatinine, Ser: 0.52 mg/dL — ABNORMAL LOW (ref 0.57–1.00)
GFR calc Af Amer: 151 mL/min/{1.73_m2} (ref >59)
GFR calc non Af Amer: 131 mL/min/{1.73_m2} (ref >59)
Globulin, Total: 2.2 g/dL (ref 1.5–4.5)
Glucose: 327 mg/dL — ABNORMAL HIGH (ref 65–99)
Potassium: 4.4 mmol/L (ref 3.5–5.2)
Sodium: 137 mmol/L (ref 134–144)
Total Protein: 6.4 g/dL (ref 6.0–8.5)

## 2018-07-25 LAB — HEMOGLOBIN A1C
Est. average glucose Bld gHb Est-mCnc: 278 mg/dL
Hgb A1c MFr Bld: 11.3 % — ABNORMAL HIGH (ref 4.8–5.6)

## 2018-07-28 ENCOUNTER — Telehealth: Payer: Self-pay | Admitting: Family Medicine

## 2018-07-28 NOTE — Assessment & Plan Note (Signed)
Stable and under good control, continue current regimen 

## 2018-07-28 NOTE — Assessment & Plan Note (Signed)
Will start another round of bactrim given new abscess, continue doxycycline per Dermatology and work on getting back in with specialists for further management

## 2018-07-28 NOTE — Assessment & Plan Note (Signed)
BPs stable and WNL, continue current regimen 

## 2018-07-28 NOTE — Assessment & Plan Note (Signed)
Managed by Endocrinology, will defer to their plan and draw A1C while we have her here getting labs.

## 2018-07-28 NOTE — Assessment & Plan Note (Signed)
Recheck lipids, treat if still elevated. Long discussion today about long term CV risks, particularly in setting of poorly controlled DM. She is agreeable to starting medication if needed

## 2018-07-28 NOTE — Telephone Encounter (Signed)
Called to discuss lab results, left message for her to return call

## 2018-07-28 NOTE — Assessment & Plan Note (Signed)
Under good control with belsomra prn, continue current regimen

## 2018-10-01 IMAGING — CT CT HEAD W/O CM
3 series · 16 of 45 positions shown, 19 images · non-contrast
Comparison: CT neck 04/20/2007

CLINICAL DATA: Blain Jumper is a 24 y.o. female
who comes into the hospital today with some weakness. The patient's
mother reports that she told her she felt as though she was going to
pass out. Patient became lethargic and minimally responsive.

EXAM:
CT HEAD WITHOUT CONTRAST
TECHNIQUE: Contiguous axial images were obtained from the base of the skull
through the vertex without intravenous contrast.

[Series 2: head wo · axial · 0.39mm/px · z∈[-136,-21]mm · 10 of 28 slices shown, 13 images]
[im 3/28  brain]
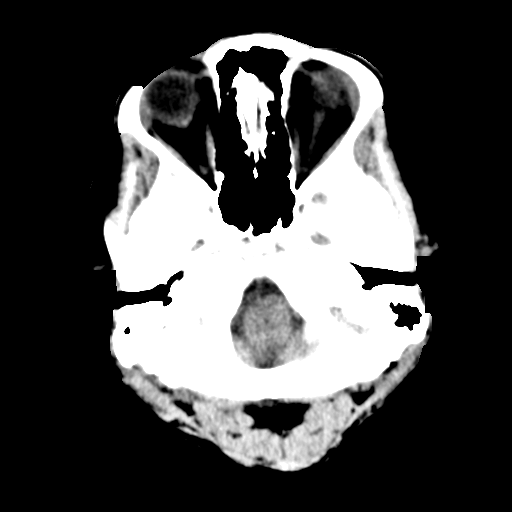
[im 3/28  bone]
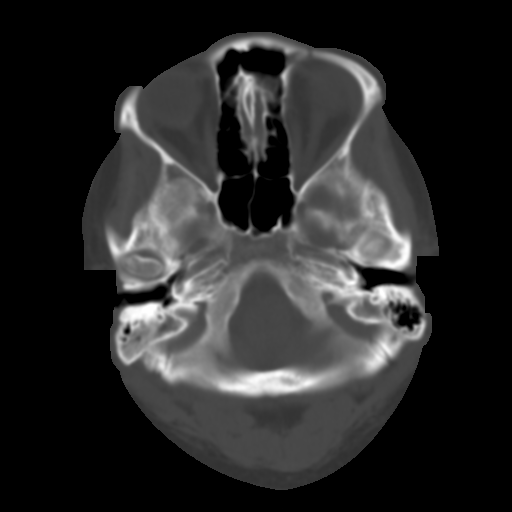
[im 5/28  brain]
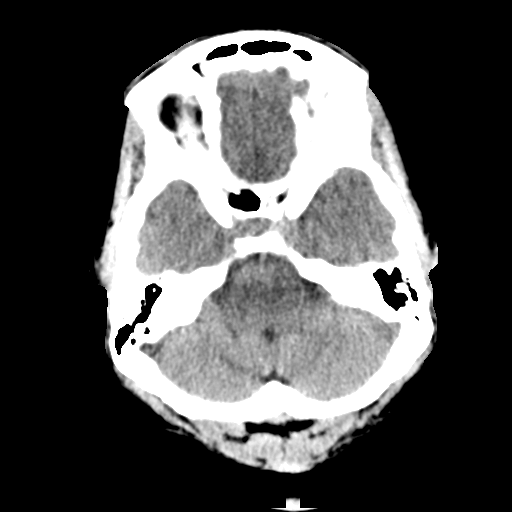
[im 8/28  brain]
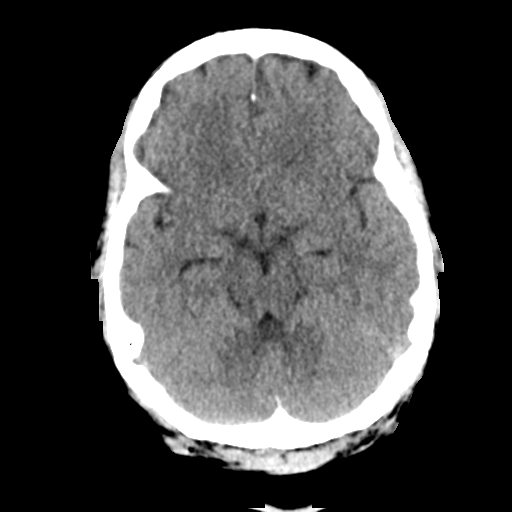
[im 11/28  brain]
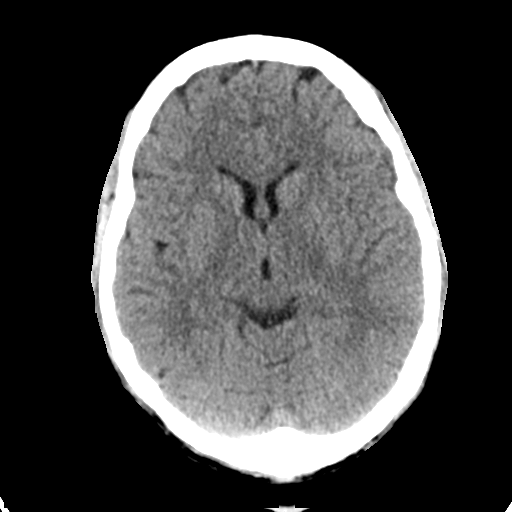
[im 13/28  brain]
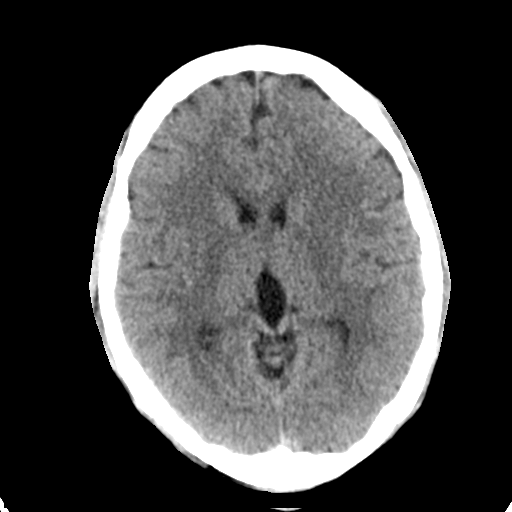
[im 13/28  bone]
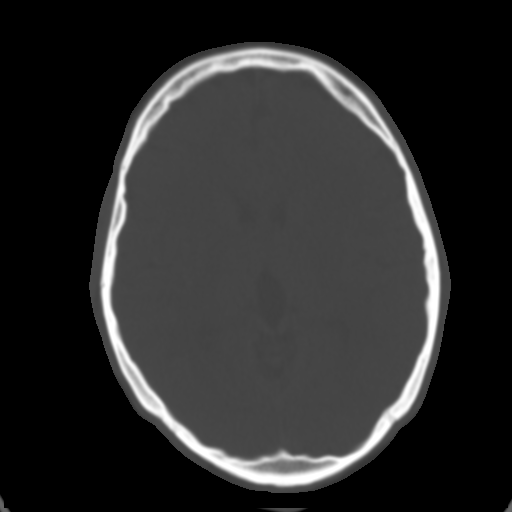
[im 16/28  brain]
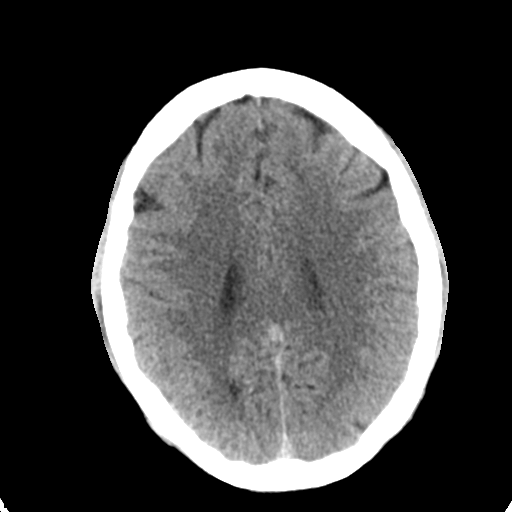
[im 18/28  brain]
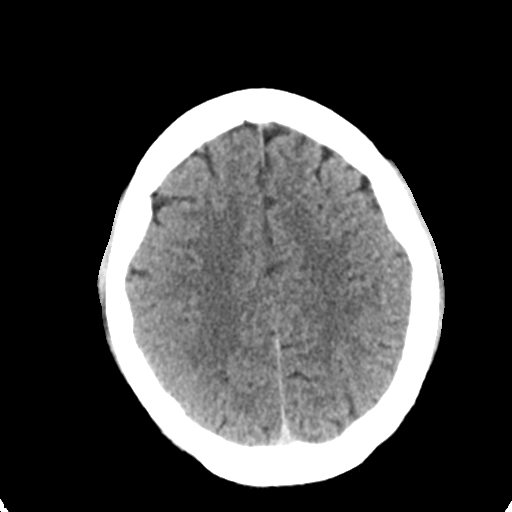
[im 21/28  brain]
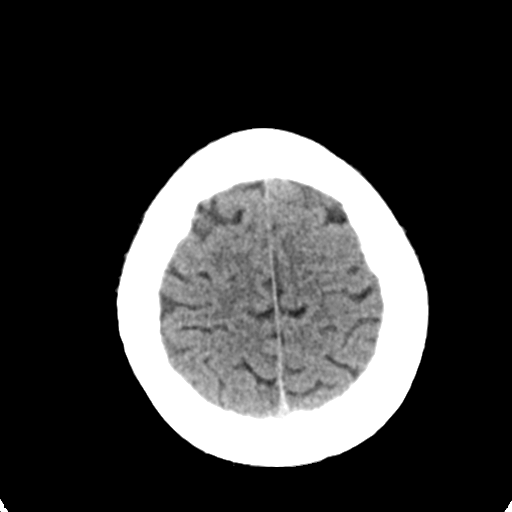
[im 24/28  brain]
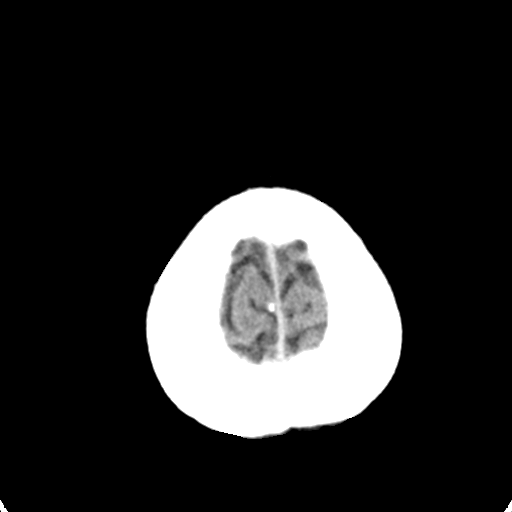
[im 24/28  bone]
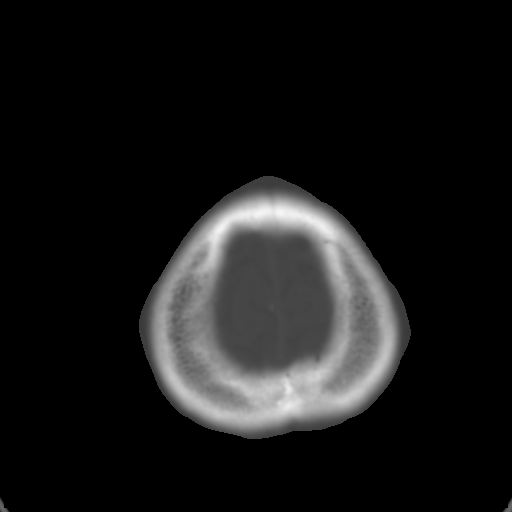
[im 26/28  brain]
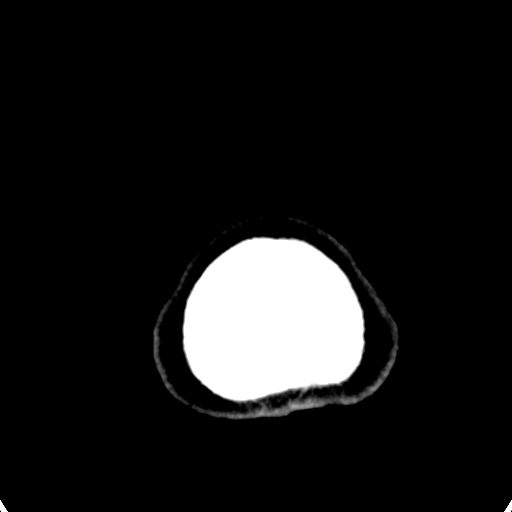

[Series 4: coronal soft tissue · coronal · 0.28mm/px · 3 of 62 slices shown]
[im 21/62  brain]
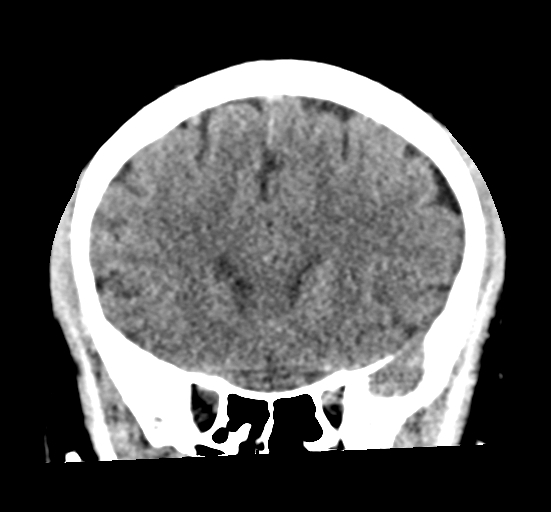
[im 28/62  brain]
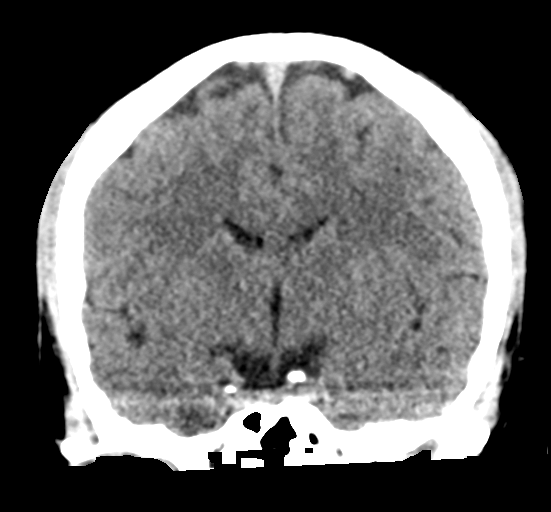
[im 34/62  brain]
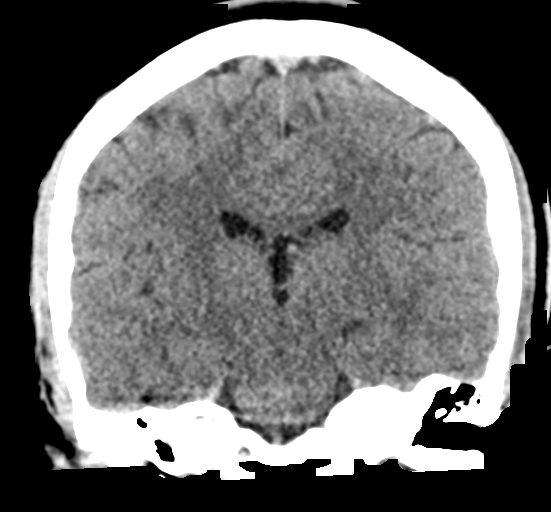

[Series 5: sagittal soft tissue · sagittal · 0.28mm/px · 3 of 53 slices shown]
[im 18/53  brain]
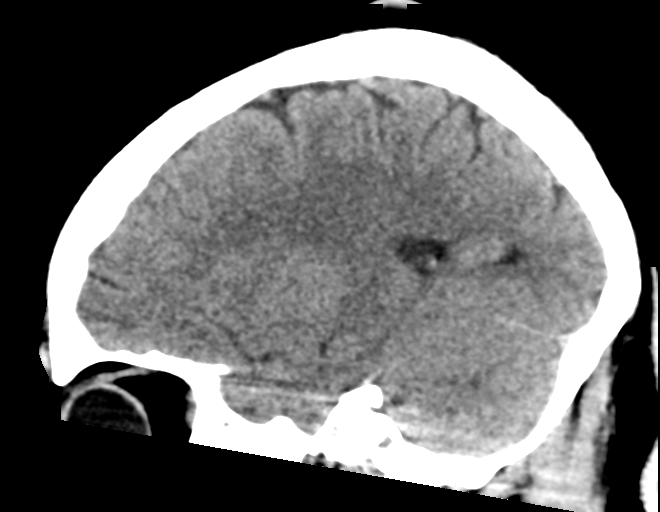
[im 27/53  brain]
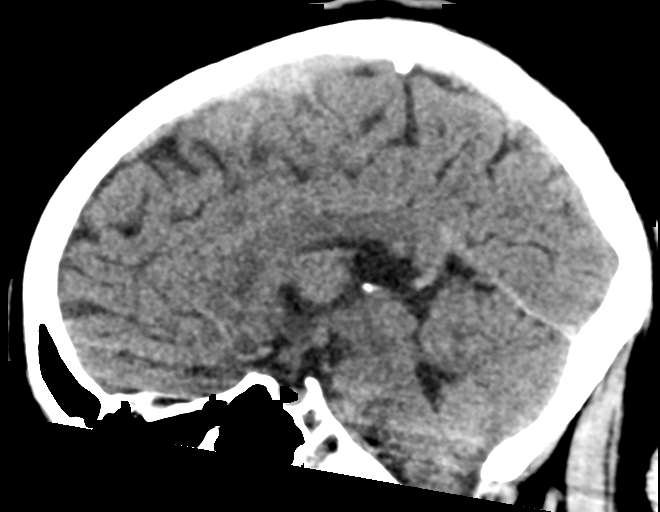
[im 35/53  brain]
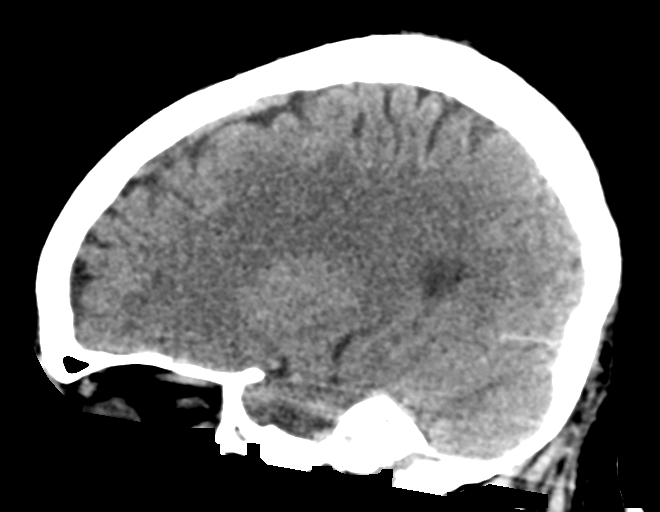

[16 of 45 positions shown; findings below may reference images not displayed]

FINDINGS: Brain: No evidence of acute infarction, hemorrhage, hydrocephalus,
extra-axial collection or mass lesion/mass effect.

Vascular: No hyperdense vessel or unexpected calcification.

Skull: Normal. Negative for fracture or focal lesion.

Sinuses/Orbits: No acute finding.

Other: None.
IMPRESSION: Negative exam.

## 2018-10-11 ENCOUNTER — Other Ambulatory Visit: Payer: Self-pay | Admitting: Family Medicine

## 2018-10-11 NOTE — Telephone Encounter (Signed)
Requested medication (s) are due for refill today:   No  Requested medication (s) are on the active medication list:   Yes  Future visit scheduled:   Yes in 3 months with Leodis Sias   Last ordered: Antibiotic that is not assigned a protocol.     Requested Prescriptions  Pending Prescriptions Disp Refills   sulfamethoxazole-trimethoprim (BACTRIM DS) 800-160 MG tablet [Pharmacy Med Name: SULFAMETH/TRIMETHOPRIM 800/160MG  TB] 14 tablet 0    Sig: TAKE 1 TABLET BY MOUTH TWICE DAILY     Off-Protocol Failed - 10/11/2018  6:50 PM      Failed - Medication not assigned to a protocol, review manually.      Passed - Valid encounter within last 12 months    Recent Outpatient Visits          2 months ago Essential hypertension   [place], [place], [place]   3 months ago Hydradenitis   [place], [place], [place]   4 months ago Breast pain, right   [place], [place], [place]   8 months ago Essential hypertension   [facility] Merrie Roof [place], [place]   9 months ago [place], Lilia Argue, [place]      Future Appointments            In 3 months Orene Desanctis, Lilia Argue, [place], [place]

## 2018-10-12 ENCOUNTER — Other Ambulatory Visit: Payer: Self-pay

## 2018-10-12 NOTE — Telephone Encounter (Signed)
Fax from [facility] requesting Rx refill on Belsomra 10 mg tablets

## 2018-10-13 MED ORDER — BELSOMRA 10 MG PO TABS: 10.0000 mg | ORAL_TABLET | Freq: Every evening | ORAL | 5 refills | Status: DC | PRN

## 2018-11-02 ENCOUNTER — Other Ambulatory Visit: Payer: Self-pay | Admitting: Unknown Physician Specialty

## 2018-12-18 ENCOUNTER — Other Ambulatory Visit: Payer: Self-pay | Admitting: Unknown Physician Specialty

## 2018-12-18 NOTE — Telephone Encounter (Signed)
Routing to provider  

## 2018-12-18 NOTE — Telephone Encounter (Signed)
Requested medication (s) are due for refill today: yes  Requested medication (s) are on the active medication list: yes  Last refill:  3/34/2020  Future visit scheduled: yes  Notes to clinic:  refill cannot be delegated    Requested Prescriptions  Pending Prescriptions Disp Refills   cyclobenzaprine (FLEXERIL) 10 MG tablet [Pharmacy Med Name: CYCLOBENZAPRINE 10MG  TABLETS] 90 tablet 1    Sig: TAKE 1 TABLET BY MOUTH AT BEDTIME IF NEEDED      Not Delegated - Analgesics:  Muscle Relaxants Failed - 12/18/2018  9:14 AM      Failed - This refill cannot be delegated      Passed - Valid encounter within last 6 months    Recent Outpatient Visits           4 months ago Essential hypertension   [facility] Volney [place], [place]   5 months ago Hydradenitis   [facility], [place], [place]   7 months ago Breast pain, right   [facility] [place], [place]   10 months ago Essential hypertension   [facility] Merrie Roof [place], [place]   12 months ago [place], Lilia Argue, [place]       Future Appointments             In 1 month Orene Desanctis, Lilia Argue, [place], [place]

## 2019-01-10 ENCOUNTER — Telehealth: Payer: Self-pay | Admitting: Family Medicine

## 2019-01-11 NOTE — Telephone Encounter (Signed)
Requested medication (s) are due for refill today: no  Requested medication (s) are on the active medication list: yes  Last refill:  10/13/2018  Future visit scheduled: yes  Notes to clinic:  Medication not assigned to a protocol, review manually   Requested Prescriptions  Pending Prescriptions Disp Refills   sulfamethoxazole-trimethoprim (BACTRIM DS) 800-160 MG tablet [Pharmacy Med Name: SULFAMETH/TRIMETHOPRIM 800/160MG  TB] 14 tablet 0    Sig: TAKE 1 TABLET BY MOUTH TWICE DAILY      Off-Protocol Failed - 01/10/2019  8:20 PM      Failed - Medication not assigned to a protocol, review manually.      Passed - Valid encounter within last 12 months    Recent Outpatient Visits           5 months ago Essential hypertension   [facility] Roosvelt Maser [place], [place]   6 months ago Hydradenitis   [facility] [place], [place], [place]   7 months ago Breast pain, right   [facility] Roosvelt Maser [place], [place]   11 months ago Essential hypertension   [facility] Roosvelt Maser [place], [place]   1 year ago Anxiety   [facility] [place], Salley Hews, [place]       Future Appointments             In 2 weeks Maurice March, Salley Hews, PA-C [facility], [facility]

## 2019-01-13 ENCOUNTER — Other Ambulatory Visit: Payer: Self-pay | Admitting: Family Medicine

## 2019-01-13 NOTE — Telephone Encounter (Signed)
Forwarding medication refill request to PCP for review. 

## 2019-01-15 MED ORDER — SULFAMETHOXAZOLE-TRIMETHOPRIM 800-160 MG PO TABS: 1.0000 | ORAL_TABLET | Freq: Two times a day (BID) | ORAL | 1 refills | Status: DC

## 2019-01-15 NOTE — Addendum Note (Signed)
Addended by: Roosvelt Maser E on: 01/15/2019 01:52 PM   Modules accepted: Orders

## 2019-01-15 NOTE — Telephone Encounter (Signed)
Please let her know I just sent over a refill - if she's not improving on that then she will need OV. I never got the message until just now which is why it hadn't been done yet so please apologize for the delay

## 2019-01-15 NOTE — Telephone Encounter (Signed)
Pt mother called and would like to know why this is not filled. Please call pt.

## 2019-01-15 NOTE — Telephone Encounter (Signed)
Called patient. Left detailed message (DPR reviewed) and asked patient to return call with any questions.

## 2019-01-15 NOTE — Telephone Encounter (Signed)
Routing to provider  

## 2019-01-27 ENCOUNTER — Encounter: Payer: Self-pay | Admitting: Family Medicine

## 2019-01-27 ENCOUNTER — Other Ambulatory Visit: Payer: Self-pay

## 2019-01-27 ENCOUNTER — Ambulatory Visit (INDEPENDENT_AMBULATORY_CARE_PROVIDER_SITE_OTHER): Payer: BLUE CROSS/BLUE SHIELD | Admitting: Family Medicine

## 2019-01-27 VITALS — BP 165/127 | HR 98 | Temp 98.9°F | Ht 65.0 in | Wt 168.0 lb

## 2019-01-27 DIAGNOSIS — E1143 Type 2 diabetes mellitus with diabetic autonomic (poly)neuropathy: Secondary | ICD-10-CM | POA: Diagnosis not present

## 2019-01-27 DIAGNOSIS — L732 Hidradenitis suppurativa: Secondary | ICD-10-CM | POA: Diagnosis not present

## 2019-01-27 DIAGNOSIS — I10 Essential (primary) hypertension: Secondary | ICD-10-CM | POA: Diagnosis not present

## 2019-01-27 DIAGNOSIS — E7800 Pure hypercholesterolemia, unspecified: Secondary | ICD-10-CM | POA: Diagnosis not present

## 2019-01-27 DIAGNOSIS — F5102 Adjustment insomnia: Secondary | ICD-10-CM

## 2019-01-27 DIAGNOSIS — K3184 Gastroparesis: Secondary | ICD-10-CM

## 2019-01-27 DIAGNOSIS — Z3041 Encounter for surveillance of contraceptive pills: Secondary | ICD-10-CM

## 2019-01-27 DIAGNOSIS — J4520 Mild intermittent asthma, uncomplicated: Secondary | ICD-10-CM

## 2019-01-27 MED ORDER — BELSOMRA 10 MG PO TABS: 10.0000 mg | ORAL_TABLET | Freq: Every evening | ORAL | 5 refills | Status: DC | PRN

## 2019-01-27 MED ORDER — AMLODIPINE BESYLATE 5 MG PO TABS: 5.0000 mg | ORAL_TABLET | Freq: Every day | ORAL | 1 refills | Status: AC

## 2019-01-27 MED ORDER — NORETHINDRONE 0.35 MG PO TABS: 1.0000 | ORAL_TABLET | Freq: Every day | ORAL | 4 refills | Status: AC

## 2019-01-27 MED ORDER — METOPROLOL SUCCINATE ER 50 MG PO TB24: 50.0000 mg | ORAL_TABLET | Freq: Every day | ORAL | 1 refills | Status: DC

## 2019-01-27 NOTE — Progress Notes (Signed)
BP (!) 165/127   Pulse 98   Temp 98.9 F (37.2 C) (Oral)   Ht 5\' 5"  (1.651 m)   Wt 168 lb (76.2 kg)   SpO2 99%   BMI 27.96 kg/m    Subjective:    Patient ID: Kristen Tran, female    DOB: [birth year], 29 y.o.   MRN: [id number]  HPI: Kristen Tran is a 29 y.o. female  Chief Complaint  Patient presents with  . Hypertension  . Hyperlipidemia  . Insomnia  . Medication Refill   HTN - home readings are around 120s-130s/80s-90s. States she tends to get stressed in clinic and feels her readings spike higher due to this. Denies CP, SOB, HAs, dizziness.   Has been having significant hidradenitis flare the past 2 weeks. Still taking her doxycycline which usually helps but this didn't resolve issue. Things seem a bit better now. Taking a bactrim course to help the flare with good relief. No fevers, body aches, sweats. Followed by Dermatology and Plastic Surgery for this issue as well but has not seen either recently due to pandemic.   Belsomra still helping when taking for sleep, usually getting 6 hours or so on it which doesn't happen otherwise.   Relevant past medical, surgical, family and social history reviewed and updated as indicated. Interim medical history since our last visit reviewed. Allergies and medications reviewed and updated.  Review of Systems  Per HPI unless specifically indicated above     Objective:    BP (!) 165/127   Pulse 98   Temp 98.9 F (37.2 C) (Oral)   Ht 5\' 5"  (1.651 m)   Wt 168 lb (76.2 kg)   SpO2 99%   BMI 27.96 kg/m   Wt Readings from Last 3 Encounters:  01/27/19 168 lb (76.2 kg)  07/24/18 175 lb (79.4 kg)  06/26/18 179 lb (81.2 kg)    Physical Exam  Results for orders placed or performed in visit on 01/27/19  Comprehensive metabolic panel  Result Value Ref Range   Glucose 334 (H) 65 - 99 mg/dL   BUN 9 6 - 20 mg/dL   Creatinine, Ser 0.61 0.57 - 1.00 mg/dL   GFR calc non Af Amer 124 >59 mL/min/1.73     GFR calc Af Amer 143 >59 mL/min/1.73   BUN/Creatinine Ratio 15 9 - 23   Sodium 138 134 - 144 mmol/L   Potassium 3.9 3.5 - 5.2 mmol/L   Chloride 101 96 - 106 mmol/L   CO2 22 20 - 29 mmol/L   Calcium 10.4 (H) 8.7 - 10.2 mg/dL   Total Protein 8.1 6.0 - 8.5 g/dL   Albumin 5.3 (H) 3.9 - 5.0 g/dL   Globulin, Total 2.8 1.5 - 4.5 g/dL   Albumin/Globulin Ratio 1.9 1.2 - 2.2   Bilirubin Total 0.4 0.0 - 1.2 mg/dL   Alkaline Phosphatase 87 39 - 117 IU/L   AST 11 0 - 40 IU/L   ALT 11 0 - 32 IU/L  Lipid Panel w/o Chol/HDL Ratio  Result Value Ref Range   Cholesterol, Total 262 (H) 100 - 199 mg/dL   Triglycerides 154 (H) 0 - 149 mg/dL   HDL 43 >39 mg/dL   VLDL Cholesterol Cal 29 5 - 40 mg/dL   LDL Chol Calc (NIH) 190 (H) 0 - 99 mg/dL      Assessment & Plan:   Problem List Items Addressed This Visit      Cardiovascular and Mediastinum   Hypertension - Primary  BP significantly elevated in office today, but normal home readings. Continue logging home readings and call with persistently abnormal readings. Continue current regimen and DASH diet      Relevant Medications   amLODipine (NORVASC) 5 MG tablet   metoprolol succinate (TOPROL-XL) 50 MG 24 hr tablet   Other Relevant Orders   Comprehensive metabolic panel (Completed)     Respiratory   Asthma    Stable on prn albuterol, continue current regimen        Digestive   Diabetic gastroparesis associated with type 2 diabetes mellitus (HCC)    Followed by Endocrinology        Musculoskeletal and Integument   Hydradenitis    Continue bactrim for flare and doxycycline continuously per Dermatology recommendation. F/u with specialists if not resolving        Other   Hyperlipidemia    Recheck lipids and adjust as needed. Continue lifestyle modifications for increased control      Relevant Medications   amLODipine (NORVASC) 5 MG tablet   metoprolol succinate (TOPROL-XL) 50 MG 24 hr tablet   Other Relevant Orders   Lipid  Panel w/o Chol/HDL Ratio (Completed)   Insomnia    Stable and under good control with prn belsomra. Continue current regimen       Other Visit Diagnoses    Encounter for surveillance of contraceptive pills       Relevant Medications   norethindrone (ORTHO MICRONOR) 0.35 MG tablet       Follow up plan: Return in about 6 months (around 07/27/2019) for CPE.

## 2019-01-28 LAB — LIPID PANEL W/O CHOL/HDL RATIO
Cholesterol, Total: 262 mg/dL — ABNORMAL HIGH (ref 100–199)
HDL: 43 mg/dL (ref >39)
LDL Chol Calc (NIH): 190 mg/dL — ABNORMAL HIGH (ref 0–99)
Triglycerides: 154 mg/dL — ABNORMAL HIGH (ref 0–149)
VLDL Cholesterol Cal: 29 mg/dL (ref 5–40)

## 2019-01-28 LAB — COMPREHENSIVE METABOLIC PANEL
ALT: 11 IU/L (ref 0–32)
AST: 11 IU/L (ref 0–40)
Albumin/Globulin Ratio: 1.9 (ref 1.2–2.2)
Albumin: 5.3 g/dL — ABNORMAL HIGH (ref 3.9–5.0)
Alkaline Phosphatase: 87 IU/L (ref 39–117)
BUN/Creatinine Ratio: 15 (ref 9–23)
BUN: 9 mg/dL (ref 6–20)
Bilirubin Total: 0.4 mg/dL (ref 0.0–1.2)
CO2: 22 mmol/L (ref 20–29)
Calcium: 10.4 mg/dL — ABNORMAL HIGH (ref 8.7–10.2)
Chloride: 101 mmol/L (ref 96–106)
Creatinine, Ser: 0.61 mg/dL (ref 0.57–1.00)
GFR calc Af Amer: 143 mL/min/{1.73_m2} (ref >59)
GFR calc non Af Amer: 124 mL/min/{1.73_m2} (ref >59)
Globulin, Total: 2.8 g/dL (ref 1.5–4.5)
Glucose: 334 mg/dL — ABNORMAL HIGH (ref 65–99)
Potassium: 3.9 mmol/L (ref 3.5–5.2)
Sodium: 138 mmol/L (ref 134–144)
Total Protein: 8.1 g/dL (ref 6.0–8.5)

## 2019-02-03 ENCOUNTER — Telehealth: Payer: Self-pay

## 2019-02-03 NOTE — Telephone Encounter (Signed)
Prior Authorization initiated via CoverMyMeds for Belsomra 10mg  tablets Key: 

## 2019-02-05 NOTE — Assessment & Plan Note (Signed)
Stable on prn albuterol, continue current regimen 

## 2019-02-05 NOTE — Assessment & Plan Note (Signed)
Stable and under good control with prn belsomra. Continue current regimen

## 2019-02-05 NOTE — Telephone Encounter (Signed)
PA denied. We should receive a fax determination as to why.

## 2019-02-05 NOTE — Assessment & Plan Note (Signed)
Continue bactrim for flare and doxycycline continuously per Dermatology recommendation. F/u with specialists if not resolving

## 2019-02-05 NOTE — Assessment & Plan Note (Signed)
Recheck lipids and adjust as needed. Continue lifestyle modifications for increased control

## 2019-02-05 NOTE — Assessment & Plan Note (Signed)
BP significantly elevated in office today, but normal home readings. Continue logging home readings and call with persistently abnormal readings. Continue current regimen and DASH diet

## 2019-02-05 NOTE — Assessment & Plan Note (Signed)
Followed by Endocrinology

## 2019-02-09 NOTE — Telephone Encounter (Signed)
Will await determination

## 2019-02-10 ENCOUNTER — Other Ambulatory Visit: Payer: Self-pay | Admitting: Unknown Physician Specialty

## 2019-02-19 ENCOUNTER — Ambulatory Visit (INDEPENDENT_AMBULATORY_CARE_PROVIDER_SITE_OTHER): Payer: BLUE CROSS/BLUE SHIELD | Admitting: Family Medicine

## 2019-02-19 ENCOUNTER — Other Ambulatory Visit: Payer: Self-pay

## 2019-02-19 ENCOUNTER — Encounter: Payer: Self-pay | Admitting: Family Medicine

## 2019-02-19 VITALS — BP 149/107 | HR 97 | Temp 98.4°F | Ht 65.0 in | Wt 167.2 lb

## 2019-02-19 DIAGNOSIS — R200 Anesthesia of skin: Secondary | ICD-10-CM | POA: Diagnosis not present

## 2019-02-19 DIAGNOSIS — I10 Essential (primary) hypertension: Secondary | ICD-10-CM

## 2019-02-19 DIAGNOSIS — G629 Polyneuropathy, unspecified: Secondary | ICD-10-CM | POA: Insufficient documentation

## 2019-02-19 NOTE — Progress Notes (Signed)
BP (!) 149/107 (BP Location: Right Arm, Patient Position: Sitting, Cuff Size: Normal)   Pulse 97   Temp 98.4 F (36.9 C) (Oral)   Ht 5\' 5"  (1.651 m)   Wt 167 lb 3.2 oz (75.8 kg)   SpO2 100%   BMI 27.82 kg/m    Subjective:    Patient ID: Kristen Tran, female    DOB: 30-Sep-1990, 29 y.o.   MRN: [id number]  HPI: Kristen Tran is a 29 y.o. female  Chief Complaint  Patient presents with  . Ankle Problem    Patient states that she can't move her left ankle. Ongoing 1 month.   1 month of left ankle numbness, loss of ability to flex foot. Mild intermittent pain but thinks that's from an old ankle sprain she dealt with last summer. No new injury, back pain, hip or knee pain, swelling, redness. Does have severe DM with neuropathy for which she used to be followed by Neurology for, but states this has been under good control for years and that this feels very different. Not trying anything OTC for relief.  Relevant past medical, surgical, family and social history reviewed and updated as indicated. Interim medical history since our last visit reviewed. Allergies and medications reviewed and updated.  Review of Systems  Per HPI unless specifically indicated above     Objective:    BP (!) 149/107 (BP Location: Right Arm, Patient Position: Sitting, Cuff Size: Normal)   Pulse 97   Temp 98.4 F (36.9 C) (Oral)   Ht 5\' 5"  (1.651 m)   Wt 167 lb 3.2 oz (75.8 kg)   SpO2 100%   BMI 27.82 kg/m   Wt Readings from Last 3 Encounters:  02/19/19 167 lb 3.2 oz (75.8 kg)  01/27/19 168 lb (76.2 kg)  07/24/18 175 lb (79.4 kg)    Physical Exam Vitals and nursing note reviewed.  Constitutional:      Appearance: Normal appearance. She is not ill-appearing.  HENT:     Head: Atraumatic.  Eyes:     Extraocular Movements: Extraocular movements intact.     Conjunctiva/sclera: Conjunctivae normal.  Cardiovascular:     Rate and Rhythm: Normal rate and regular  rhythm.     Heart sounds: Normal heart sounds.  Pulmonary:     Effort: Pulmonary effort is normal.     Breath sounds: Normal breath sounds.  Musculoskeletal:        General: No swelling. Normal range of motion.     Cervical back: Normal range of motion and neck supple.     Comments: Good ROM of toes left foot and able to extend left foot, no ability to flex left foot  Skin:    General: Skin is warm and dry.     Coloration: Skin is not pale.     Findings: No erythema.  Neurological:     Mental Status: She is alert and oriented to person, place, and time.     Sensory: Sensory deficit (localized left ankle decreased sensation. good sensation above and below ankle) present.  Psychiatric:        Mood and Affect: Mood normal.        Thought Content: Thought content normal.        Judgment: Judgment normal.     Results for orders placed or performed in visit on 01/27/19  Comprehensive metabolic panel  Result Value Ref Range   Glucose 334 (H) 65 - 99 mg/dL   BUN 9 6 - 20  mg/dL   Creatinine, Ser 9.56 0.57 - 1.00 mg/dL   GFR calc non Af Amer 124 >59 mL/min/1.73   GFR calc Af Amer 143 >59 mL/min/1.73   BUN/Creatinine Ratio 15 9 - 23   Sodium 138 134 - 144 mmol/L   Potassium 3.9 3.5 - 5.2 mmol/L   Chloride 101 96 - 106 mmol/L   CO2 22 20 - 29 mmol/L   Calcium 10.4 (H) 8.7 - 10.2 mg/dL   Total Protein 8.1 6.0 - 8.5 g/dL   Albumin 5.3 (H) 3.9 - 5.0 g/dL   Globulin, Total 2.8 1.5 - 4.5 g/dL   Albumin/Globulin Ratio 1.9 1.2 - 2.2   Bilirubin Total 0.4 0.0 - 1.2 mg/dL   Alkaline Phosphatase 87 39 - 117 IU/L   AST 11 0 - 40 IU/L   ALT 11 0 - 32 IU/L  Lipid Panel w/o Chol/HDL Ratio  Result Value Ref Range   Cholesterol, Total 262 (H) 100 - 199 mg/dL   Triglycerides 387 (H) 0 - 149 mg/dL   HDL 43 >56 mg/dL   VLDL Cholesterol Cal 29 5 - 40 mg/dL   LDL Chol Calc (NIH) 433 (H) 0 - 99 mg/dL      Assessment & Plan:   Problem List Items Addressed This Visit      Cardiovascular and  Mediastinum   Hypertension    Readings persistently elevated in clinic but normal during regular home BP checks. Continue to monitor closely, will avoid increasing regimen so as to not drop her pressures too low when in relaxed environment. Has significant anxiety related to healthcare facilities given long past hx of medical issues        Nervous and Auditory   Peripheral neuropathy    Stable and well controlled, related to her DM. Followed by Neurology       Other Visit Diagnoses    Lower extremity numbness    -  Primary   localized to left ankle, particularly with flexion of L foot. Will follow up with her Neurologist for further workup. Monitor closely in meantime       Follow up plan: Return for as scheduled.

## 2019-02-19 NOTE — Assessment & Plan Note (Signed)
Readings persistently elevated in clinic but normal during regular home BP checks. Continue to monitor closely, will avoid increasing regimen so as to not drop her pressures too low when in relaxed environment. Has significant anxiety related to healthcare facilities given long past hx of medical issues

## 2019-02-19 NOTE — Assessment & Plan Note (Signed)
Stable and well controlled, related to her DM. Followed by Neurology

## 2019-03-04 ENCOUNTER — Other Ambulatory Visit: Payer: Self-pay | Admitting: Family Medicine

## 2019-03-04 NOTE — Telephone Encounter (Signed)
Metoprolol Requested medications are due for refill today? NO - has one refill remaining.    Requested medications are on active medication list? Yes  Last Refill:  01/27/2019 #90 with one refill.  Patient has refill available.    Future visit scheduled?  Yes in 5 months.    Cyclobenzaprine Requested medications are due for refill today? NON-DELEGATED MEDICATION  Requested medications are on active medication list? Yes  Last Refill:  12/18/2018 90 tabs with 0 refills.    Future visit scheduled?  Yes in 5 months.

## 2019-03-05 NOTE — Telephone Encounter (Signed)
Cyclobenazeprine not due until 03/18/19  Metoprolol just given 01/27/19 for a 6 month supply

## 2019-03-15 NOTE — Progress Notes (Signed)
Cardiology Office Note    Date:  03/19/2019   ID:  Kristen Tran, DOB 1990/12/24, MRN [id number]  PCP:  Kristen Nearing, PA-C  Cardiologist:  Formerly, Dr. Alvino Chapel  Electrophysiologist:  None   Chief Complaint: Follow-up  History of Present Illness:   Kristen Tran is a 29 y.o. female with history of recurrent syncope, poorly controlled diabetes diagnosed at age 17, family history of premature CAD with her father dying at a young age, HTN, obesity, atypical chest pain, anxiety, and depression who presents for follow-up of syncope.  She was previously evaluated in late 2017 secondary to syncopal episodes that were occurring in the setting of high blood sugars.  There was concern for vasovagal syncope.  Echo in 2017 showed normal LV systolic function with concentric LVH and near cavity obliteration in systole.  RV systolic function was normal.  It was recommended the patient maintain adequate hydration and follow blood sugar levels closely.  She has reported a many year history of intermittent syncopal episodes, sometimes occurring with hypoglycemia as well.  She was last seen in the office in 10/2016 after being evaluated at [facility] ED twice.  Initial visit in the ED for syncope at which time she was found to be hypoglycemic and bradycardic with remaining work-up unrevealing.  Following this, she had pleuritic chest discomfort with associated LOC.  Work-up was unrevealing outside of a rash on the left thigh with the patient subsequently being diagnosed with shingles.  Given the patient's symptoms and comorbid risk factors including diabetes diagnosed at a young age as well as family history of premature coronary disease it was recommended she undergo exercise treadmill stress testing, I do not see this was completed.  Outpatient cardiac monitoring was felt to be of low yield given the infrequency of her symptoms.    She comes in doing well today.  Since she  was last seen, she reports 3 syncopal episodes in 2019 and 2 syncopal episodes in 2020, both attributed to hypo-/hyperglycemic episodes.  She indicates these episodes are gradual in onset during which time she gets herself to the floor.  She reports that she is typically out for 30 seconds to 1 minute in duration.  They are not sudden onset.  They are associated with some flushing and palpitations.  She denies any chest pain or dyspnea.  She is tolerating her antihypertensive medications without issue.   Labs independently reviewed: 01/2019 - TC 262, TG 154, HDL 43, LDL 190, BUN 9, serum creatinine 0.61, potassium 3.9, albumin 5.3, AST/ALT normal 07/2018 - A1c 11.3 05/2017 - Hgb 15.1, PLT 227  Past Medical History:  Diagnosis Date  . Anemia   . Anxiety   . Asthma   . Atopic dermatitis   . Atypical chest pain   . Bronchopneumonia   . Depression   . Diabetes mellitus without complication (HCC)    a. Dx @ age 43.  . Gastroparesis   . GERD (gastroesophageal reflux disease)   . Hidradenitis suppurativa   . Hypertension   . Obesity   . Syncope    a. Recurrent - in setting of hypoglycemia; b. 12/2015 Echo: EF EF 60-65%, no rwma, mild conc hypertrophy w/ near cavity obliteration in systole.  Nl RV fxn.    Past Surgical History:  Procedure Laterality Date  . CHOLECYSTECTOMY    . FRACTURE SURGERY    . HYDRADENITIS EXCISION     pt states she has a place removed from right underarm and replaced  it with a skin graft from right leg  . WISDOM TOOTH EXTRACTION      Current Medications: Current Meds  Medication Sig  . acyclovir (ZOVIRAX) 400 MG tablet TAKE 1 TABLET(400 MG) BY MOUTH THREE TIMES DAILY  . albuterol (VENTOLIN HFA) 108 (90 BASE) MCG/ACT inhaler Inhale 2 puffs into the lungs every 6 (six) hours as needed for wheezing or shortness of breath.  Marland Kitchen amLODipine (NORVASC) 5 MG tablet Take 1 tablet (5 mg total) by mouth daily.  . cetirizine (ZYRTEC) 10 MG tablet Take 10 mg by mouth daily. As  needed for allergies  . clindamycin-tretinoin (ZIANA) gel Apply topically at bedtime.  . Continuous Blood Gluc Sensor (FREESTYLE LIBRE 14 DAY SENSOR) MISC Dispense 1 sensor every 14 days.  . cyclobenzaprine (FLEXERIL) 10 MG tablet TAKE 1 TABLET BY MOUTH AT BEDTIME AS NEEDED  . doxycycline (VIBRAMYCIN) 100 MG capsule Take by mouth as needed.   . gabapentin (NEURONTIN) 300 MG capsule TAKE 1 CAPSULE BY MOUTH THREE TIMES DAILY AS NEEDED  . glucose blood test strip 1 each by Other route as needed for other. Use as instructed  . hydrOXYzine (ATARAX/VISTARIL) 25 MG tablet Take 1 tablet (25 mg total) by mouth every 8 (eight) hours as needed.  . insulin aspart (NOVOLOG FLEXPEN) 100 UNIT/ML FlexPen Take 3-5 units with meals and snacks plus 2 extra for every 50 points over 150. Up to 50 units per day.  . Insulin Syringe-Needle U-100 (INSULIN SYRINGE .5CC/31GX5/16") 31G X 5/16" 0.5 ML MISC ok to sub any brand or size insulin syringe preferred by insurance/patient, use 5x/day, dx E11.65  . metoCLOPramide (REGLAN) 10 MG tablet Take 1 tablet (10 mg total) by mouth daily as needed.  . metoprolol succinate (TOPROL-XL) 50 MG 24 hr tablet TAKE 1 TABLET BY MOUTH EVERY DAY WITH OR IMMEDIATELY FOLLOWING A MEAL  . montelukast (SINGULAIR) 10 MG tablet Take 10 mg by mouth at bedtime. As needed  . norethindrone (ORTHO MICRONOR) 0.35 MG tablet Take 1 tablet (0.35 mg total) by mouth daily.  Marland Kitchen omeprazole (PRILOSEC) 20 MG capsule Take 20 mg by mouth daily.  . ondansetron (ZOFRAN ODT) 4 MG disintegrating tablet Take 1 tablet (4 mg total) by mouth every 8 (eight) hours as needed for nausea or vomiting.  . promethazine (PHENERGAN) 25 MG tablet TAKE 1 TABLET BY MOUTH EVERY 8 HOURS AS NEEDED FOR NAUSEA OR VOMITING  . spironolactone (ALDACTONE) 100 MG tablet Take 1 tablet (100 mg total) by mouth daily.  . Suvorexant (BELSOMRA) 10 MG TABS Take 10 mg by mouth at bedtime as needed.  . TRESIBA FLEXTOUCH 100 UNIT/ML SOPN FlexTouch Pen  INJECT UP TO 70 UNITS SUBCUTANEOUSLY ONCE DAILY  . triamcinolone cream (KENALOG) 0.1 % Apply 1 application topically 2 (two) times daily.    Allergies:   Bee venom, Ibuprofen, Morphine sulfate, Nsaids, Tomato, Wool alcohol [lanolin], and Contrast media [iodinated diagnostic agents]   Social History   Socioeconomic History  . Marital status: Single    Spouse name: Not on file  . Number of children: Not on file  . Years of education: Not on file  . Highest education level: Not on file  Occupational History  . Not on file  Tobacco Use  . Smoking status: Never Smoker  . Smokeless tobacco: Never Used  Substance and Sexual Activity  . Alcohol use: Yes    Alcohol/week: 0.0 - 2.0 standard drinks    Comment: on occasion  . Drug use: No  . Sexual activity:  Yes  Other Topics Concern  . Not on file  Social History Narrative  . Not on file   Social Determinants of Health   Financial Resource Strain:   . Difficulty of Paying Living Expenses:   Food Insecurity:   . Worried About Charity fundraiser in the Last Year:   . Arboriculturist in the Last Year:   Transportation Needs:   . Film/video editor (Medical):   Marland Kitchen Lack of Transportation (Non-Medical):   Physical Activity:   . Days of Exercise per Week:   . Minutes of Exercise per Session:   Stress:   . Feeling of Stress :   Social Connections:   . Frequency of Communication with Friends and Family:   . Frequency of Social Gatherings with Friends and Family:   . Attends Religious Services:   . Active Member of Clubs or Organizations:   . Attends Archivist Meetings:   Marland Kitchen Marital Status:      Family History:  The patient's family history includes Diabetes in her brother, father, maternal grandfather, maternal grandmother, mother, paternal grandfather, paternal grandmother, and sister; Heart disease in her father; Hypertension in her father, maternal grandfather, maternal grandmother, paternal grandfather, and  paternal grandmother.  ROS:   Review of Systems  Constitutional: Negative for chills, diaphoresis, fever, malaise/fatigue and weight loss.  HENT: Negative for congestion.   Eyes: Negative for discharge and redness.  Respiratory: Negative for cough, sputum production, shortness of breath and wheezing.   Cardiovascular: Negative for chest pain, palpitations, orthopnea, claudication, leg swelling and PND.  Gastrointestinal: Negative for abdominal pain, heartburn, nausea and vomiting.  Musculoskeletal: Positive for joint pain. Negative for falls and myalgias.       Left ankle  Skin: Negative for rash.  Neurological: Positive for loss of consciousness. Negative for dizziness, tingling, tremors, sensory change, speech change, focal weakness and weakness.  Psychiatric/Behavioral: Negative for substance abuse. The patient is not nervous/anxious.   All other systems reviewed and are negative.    EKGs/Labs/Other Studies Reviewed:    Studies reviewed were summarized above. The additional studies were reviewed today:  2D echo 12/2015: - Left ventricle: The cavity size was normal. There was mild  concentric hypertrophy and near cavity obliteration in systole.  No significant LVOT gradient. Systolic function was normal. The  estimated ejection fraction was in the range of 60% to 65%. Wall  motion was normal; there were no regional wall motion  abnormalities. Left ventricular diastolic function parameters  were normal.  - Left atrium: The atrium was normal in size.  - Right ventricle: Systolic function was normal.  - Pulmonary arteries: Systolic pressure was within the normal  range.    EKG:  EKG is ordered today.  The EKG ordered today demonstrates NSR, 81 bpm, early repolarization abnormality with inferior T wave inversion, unchanged from prior  Recent Labs: 01/27/2019: ALT 11; BUN 9; Creatinine, Ser 0.61; Potassium 3.9; Sodium 138  Recent Lipid Panel    Component Value  Date/Time   CHOL 262 (H) 01/27/2019 1650   CHOL 191 12/26/2015 0811   TRIG 154 (H) 01/27/2019 1650   TRIG 126 12/26/2015 0811   HDL 43 01/27/2019 1650   VLDL 25 12/26/2015 0811   LDLCALC 190 (H) 01/27/2019 1650    PHYSICAL EXAM:    VS:  BP 122/74 (BP Location: Left Arm, Patient Position: Sitting, Cuff Size: Normal)   Pulse 81   Ht 5\' 5"  (1.651 m)   Wt 166  lb 9.6 oz (75.6 kg)   SpO2 97%   BMI 27.72 kg/m   BMI: Body mass index is 27.72 kg/m.  Physical Exam  Constitutional: She is oriented to person, place, and time. She appears well-developed and well-nourished.  HENT:  Head: Normocephalic and atraumatic.  Eyes: Right eye exhibits no discharge. Left eye exhibits no discharge.  Neck: No JVD present.  Cardiovascular: Normal rate, regular rhythm, S1 normal, S2 normal and normal heart sounds. Exam reveals no distant heart sounds, no friction rub, no midsystolic click and no opening snap.  No murmur heard. Pulses:      Dorsalis pedis pulses are 2+ on the right side and 2+ on the left side.       Posterior tibial pulses are 2+ on the right side and 2+ on the left side.  Pulmonary/Chest: Effort normal and breath sounds normal. No respiratory distress. She has no decreased breath sounds. She has no wheezes. She has no rales. She exhibits no tenderness.  Abdominal: Soft. She exhibits no distension. There is no abdominal tenderness.  Musculoskeletal:        General: No edema.     Cervical back: Normal range of motion.  Neurological: She is alert and oriented to person, place, and time.  Skin: Skin is warm and dry. No cyanosis. Nails show no clubbing.  Psychiatric: She has a normal mood and affect. Her speech is normal and behavior is normal. Judgment and thought content normal.    Wt Readings from Last 3 Encounters:  03/19/19 166 lb 9.6 oz (75.6 kg)  02/19/19 167 lb 3.2 oz (75.8 kg)  01/27/19 168 lb (76.2 kg)     ASSESSMENT & PLAN:   1. Recurrent syncope: She has a long history  of intermittent syncope that has been attributed to both hypo and hyperglycemia, possibly exacerbated by near cavity obliteration in systole as noted on prior echo.  Recommend she maintain well-controlled blood sugars and avoid dehydrated state.  No evidence of LVOT gradient on echo.  No family history of sudden cardiac death.  Since she was last seen, she indicates 3 syncopal episodes in 2019 and 2 syncopal episodes in 2020 which she has attributed to the above blood sugar issues.  Update echo.  Given the infrequency of her episodes and gradual onset of symptoms outpatient cardiac monitoring is of low yield.  If echo shows any evidence of HCM would pursue cardiac MRI.  No plans for ischemic evaluation at this time as outlined below.  2. Chest pain: No further episodes.  Previously felt to be pleuritic in nature.  Check echo as outlined above.  Assuming this shows no evidence of cardiomyopathy no plans for ischemic evaluation at this time given no recent symptoms.  3. HTN: Blood pressure is well controlled in the office today.  Continue current therapy including amlodipine, Toprol-XL, and spironolactone.  Recent chemistries showed normal renal function and potassium of 3.9.  4. HLD: LDL of 190 from 01/2019.  Would have target LDL at this time of less than 100.  She has been a diabetic since age 62 and has family history of premature coronary disease.  Not currently on a statin.  Lipid panel from 01/2019 was nonfasting.  She is fasting this morning.  In the setting we will check a lipid panel with low threshold to initiate statin therapy given she is a diabetic and has family history of premature coronary disease.  Alternatively, we did discuss the possibility of pursuing a calcium score for further  risk ratification though I do feel she should be on a statin if her LDL remains significantly elevated given the above comorbid conditions.  5. DM: Followed by PCP.  Most recent A1c of 11.3 from 07/2018.  Notes  indicate episodes of hypo and hyperglycemia have played a role in her recurrent loss of consciousness.  Recommend she follow-up with PCP for further escalation of diabetic therapy.  Disposition: F/u with Dr. Azucena Cecil in 6 months, sooner if needed.  Visit must be with MD to reestablish primary cardiologist.   Medication Adjustments/Labs and Tests Ordered: Current medicines are reviewed at length with the patient today.  Concerns regarding medicines are outlined above. Medication changes, Labs and Tests ordered today are summarized above and listed in the Patient Instructions accessible in Encounters.   Signed, Eula Listen, PA-C 03/19/2019 8:10 AM     [facility] [place] [address] [place], [place] [address] [phone]

## 2019-03-19 ENCOUNTER — Ambulatory Visit (INDEPENDENT_AMBULATORY_CARE_PROVIDER_SITE_OTHER): Payer: Self-pay | Admitting: Physician Assistant

## 2019-03-19 ENCOUNTER — Other Ambulatory Visit: Payer: Self-pay

## 2019-03-19 ENCOUNTER — Other Ambulatory Visit: Payer: Self-pay | Admitting: Surgery

## 2019-03-19 ENCOUNTER — Encounter: Payer: Self-pay | Admitting: Physician Assistant

## 2019-03-19 VITALS — BP 122/74 | HR 81 | Ht 65.0 in | Wt 166.6 lb

## 2019-03-19 DIAGNOSIS — R55 Syncope and collapse: Secondary | ICD-10-CM

## 2019-03-19 DIAGNOSIS — E785 Hyperlipidemia, unspecified: Secondary | ICD-10-CM

## 2019-03-19 DIAGNOSIS — R0789 Other chest pain: Secondary | ICD-10-CM

## 2019-03-19 DIAGNOSIS — I10 Essential (primary) hypertension: Secondary | ICD-10-CM

## 2019-03-19 DIAGNOSIS — E1165 Type 2 diabetes mellitus with hyperglycemia: Secondary | ICD-10-CM

## 2019-03-19 NOTE — Addendum Note (Signed)
Addended by: Delorse Limber I on: 03/19/2019 10:35 AM   Modules accepted: Orders

## 2019-03-19 NOTE — Patient Instructions (Signed)
Medication Instructions:  Your physician recommends that you continue on your current medications as directed. Please refer to the Current Medication list given to you today.  *If you need a refill on your cardiac medications before your next appointment, please call your pharmacy*   Lab Work: Your physician recommends that you have lab work today(Lipid)  If you have labs (blood work) drawn today and your tests are completely normal, you will receive your results only by: Marland Kitchen MyChart Message (if you have MyChart) OR . A paper copy in the mail If you have any lab test that is abnormal or we need to change your treatment, we will call you to review the results.   Testing/Procedures: 1- Echo  Please return to [facility] on ______________ at _______________ AM/PM for an Echocardiogram. Your physician has requested that you have an echocardiogram. Echocardiography is a painless test that uses sound waves to create images of your heart. It provides your doctor with information about the size and shape of your heart and how well your heart's chambers and valves are working. This procedure takes approximately one hour. There are no restrictions for this procedure. Please note; depending on visual quality an IV may need to be placed.     Follow-Up: At [facility], you and your health needs are our priority.  As part of our continuing mission to provide you with exceptional heart care, we have created designated Provider Care Teams.  These Care Teams include your primary Cardiologist (physician) and Advanced Practice Providers (APPs -  Physician Assistants and Nurse Practitioners) who all work together to provide you with the care you need, when you need it.  We recommend signing up for the patient portal called "MyChart".  Sign up information is provided on this After Visit Summary.  MyChart is used to connect with patients for Virtual Visits (Telemedicine).  Patients are able to view  lab/test results, encounter notes, upcoming appointments, etc.  Non-urgent messages can be sent to your provider as well.   To learn more about what you can do with MyChart, go to [email or web address].    Your next appointment:   6 month(s)  The format for your next appointment:   In Person  Provider:   Please establish primary cardiologist at your next office visit.  Please see MD.

## 2019-03-20 LAB — LIPID PANEL
Chol/HDL Ratio: 6.9 ratio — ABNORMAL HIGH (ref 0.0–4.4)
Cholesterol, Total: 220 mg/dL — ABNORMAL HIGH (ref 100–199)
HDL: 32 mg/dL — ABNORMAL LOW (ref >39)
LDL Chol Calc (NIH): 166 mg/dL — ABNORMAL HIGH (ref 0–99)
Triglycerides: 121 mg/dL (ref 0–149)
VLDL Cholesterol Cal: 22 mg/dL (ref 5–40)

## 2019-03-21 ENCOUNTER — Other Ambulatory Visit: Payer: Self-pay | Admitting: Family Medicine

## 2019-03-22 ENCOUNTER — Other Ambulatory Visit
Admission: RE | Admit: 2019-03-22 | Discharge: 2019-03-22 | Disposition: A | Discharge status: Home / Self Care | Payer: BLUE CROSS/BLUE SHIELD | Source: Ambulatory Visit | Attending: Surgery | Admitting: Surgery

## 2019-03-22 ENCOUNTER — Telehealth: Payer: Self-pay

## 2019-03-22 ENCOUNTER — Other Ambulatory Visit: Payer: Self-pay

## 2019-03-22 DIAGNOSIS — Z01812 Encounter for preprocedural laboratory examination: Secondary | ICD-10-CM | POA: Insufficient documentation

## 2019-03-22 DIAGNOSIS — E785 Hyperlipidemia, unspecified: Secondary | ICD-10-CM

## 2019-03-22 DIAGNOSIS — Z20822 Contact with and (suspected) exposure to covid-19: Secondary | ICD-10-CM | POA: Insufficient documentation

## 2019-03-22 LAB — SARS CORONAVIRUS 2 (TAT 6-24 HRS): SARS Coronavirus 2: NEGATIVE

## 2019-03-22 MED ORDER — ATORVASTATIN CALCIUM 40 MG PO TABS: 40.0000 mg | ORAL_TABLET | Freq: Every day | ORAL | 3 refills | Status: AC

## 2019-03-22 NOTE — Telephone Encounter (Signed)
Call to patient to review labs and results.   Rx sent as requested.   Labs ordered for recheck.   Advised pt to call for any further questions or concerns.

## 2019-03-22 NOTE — Telephone Encounter (Signed)
-----   Message from Creig Hines, NP sent at 03/22/2019 10:15 AM EDT ----- LDL cholesterol 166 w/ total cholesterol of 220.  HDL chol is low @ 32.  With type 1 diabetes and Fam hx of premature CAD, I recommend initiating atorvastatin 40mg  daily.  I also strongly encourage regular exercise.

## 2019-03-22 NOTE — Telephone Encounter (Signed)
Requested Prescriptions  Pending Prescriptions Disp Refills  . acyclovir (ZOVIRAX) 400 MG tablet [Pharmacy Med Name: ACYCLOVIR 400MG  TABLETS] 90 tablet 2    Sig: TAKE 1 TABLET(400 MG) BY MOUTH THREE TIMES DAILY     Antimicrobials:  Antiviral Agents - Anti-Herpetic Passed - 03/21/2019  7:32 PM      Passed - Valid encounter within last 12 months    Recent Outpatient Visits          1 month ago Lower extremity numbness   [facility], [place] Nearing   1 month ago Essential hypertension   [facility] [place], [place]   8 months ago Essential hypertension   [facility] [place]   8 months ago Hydradenitis   [facility], [place], [place]   10 months ago Breast pain, right   [facility], [facility], [place] Hews      Future Appointments            In 4 months [place], Maurice March, PA-C [place], [facility]   In 6 months [place], [employer], [place] [facility], [facility]

## 2019-03-23 ENCOUNTER — Other Ambulatory Visit: Payer: Self-pay

## 2019-03-23 ENCOUNTER — Encounter
Admission: RE | Disposition: A | Discharge status: Home / Self Care | Payer: Self-pay | Source: Home / Self Care | Attending: Surgery

## 2019-03-23 ENCOUNTER — Ambulatory Visit: Payer: BLUE CROSS/BLUE SHIELD | Admitting: Anesthesiology

## 2019-03-23 ENCOUNTER — Encounter: Payer: Self-pay | Admitting: Surgery

## 2019-03-23 ENCOUNTER — Ambulatory Visit
Admission: RE | Admit: 2019-03-23 | Discharge: 2019-03-23 | Disposition: A | Discharge status: Home / Self Care | Payer: BLUE CROSS/BLUE SHIELD | Attending: Surgery | Admitting: Surgery

## 2019-03-23 DIAGNOSIS — G5792 Unspecified mononeuropathy of left lower limb: Secondary | ICD-10-CM | POA: Diagnosis present

## 2019-03-23 DIAGNOSIS — M21372 Foot drop, left foot: Secondary | ICD-10-CM | POA: Diagnosis not present

## 2019-03-23 DIAGNOSIS — F419 Anxiety disorder, unspecified: Secondary | ICD-10-CM | POA: Diagnosis not present

## 2019-03-23 DIAGNOSIS — F329 Major depressive disorder, single episode, unspecified: Secondary | ICD-10-CM | POA: Insufficient documentation

## 2019-03-23 DIAGNOSIS — Z6827 Body mass index (BMI) 27.0-27.9, adult: Secondary | ICD-10-CM | POA: Diagnosis not present

## 2019-03-23 DIAGNOSIS — K219 Gastro-esophageal reflux disease without esophagitis: Secondary | ICD-10-CM | POA: Insufficient documentation

## 2019-03-23 DIAGNOSIS — E669 Obesity, unspecified: Secondary | ICD-10-CM | POA: Insufficient documentation

## 2019-03-23 HISTORY — PX: SUPERFICIAL PERONEAL NERVE RELEASE: SHX6200

## 2019-03-23 LAB — BASIC METABOLIC PANEL
Anion gap: 11 (ref 5–15)
BUN: 13 mg/dL (ref 6–20)
CO2: 27 mmol/L (ref 22–32)
Calcium: 9.1 mg/dL (ref 8.9–10.3)
Chloride: 100 mmol/L (ref 98–111)
Creatinine, Ser: 0.5 mg/dL (ref 0.44–1.00)
GFR calc Af Amer: >60 mL/min (ref >60)
GFR calc non Af Amer: >60 mL/min (ref >60)
Glucose, Bld: 384 mg/dL — ABNORMAL HIGH (ref 70–99)
Potassium: 3.4 mmol/L — ABNORMAL LOW (ref 3.5–5.1)
Sodium: 138 mmol/L (ref 135–145)

## 2019-03-23 LAB — CBC
HCT: 42.8 % (ref 36.0–46.0)
Hemoglobin: 15.3 g/dL — ABNORMAL HIGH (ref 12.0–15.0)
MCH: 31.9 pg (ref 26.0–34.0)
MCHC: 35.7 g/dL (ref 30.0–36.0)
MCV: 89.4 fL (ref 80.0–100.0)
Platelets: 283 10*3/uL (ref 150–400)
RBC: 4.79 MIL/uL (ref 3.87–5.11)
RDW: 11.1 % — ABNORMAL LOW (ref 11.5–15.5)
WBC: 6 10*3/uL (ref 4.0–10.5)
nRBC: 0 % (ref 0.0–0.2)

## 2019-03-23 LAB — POCT PREGNANCY, URINE: Preg Test, Ur: NEGATIVE

## 2019-03-23 LAB — GLUCOSE, CAPILLARY
Glucose-Capillary: 346 mg/dL — ABNORMAL HIGH (ref 70–99)
Glucose-Capillary: 322 mg/dL — ABNORMAL HIGH (ref 70–99)
Glucose-Capillary: 252 mg/dL — ABNORMAL HIGH (ref 70–99)

## 2019-03-23 SURGERY — DECOMPRESSION, NERVE, SUPERFICIAL PERONEAL
Anesthesia: General | Laterality: Left

## 2019-03-23 MED ORDER — FAMOTIDINE 20 MG PO TABS
ORAL_TABLET | ORAL | Status: AC
  Administered 2019-03-23: 20 mg via ORAL
  Filled 2019-03-23: qty 1

## 2019-03-23 MED ORDER — ACETAMINOPHEN 10 MG/ML IV SOLN
INTRAVENOUS | Status: AC
  Filled 2019-03-23: qty 100

## 2019-03-23 MED ORDER — POTASSIUM CHLORIDE IN NACL 20-0.9 MEQ/L-% IV SOLN
INTRAVENOUS | Status: DC
  Filled 2019-03-23: qty 1000

## 2019-03-23 MED ORDER — BUPIVACAINE-EPINEPHRINE (PF) 0.5% -1:200000 IJ SOLN
INTRAMUSCULAR | Status: DC | PRN
  Administered 2019-03-23: 10 mL

## 2019-03-23 MED ORDER — INSULIN ASPART 100 UNIT/ML ~~LOC~~ SOLN
SUBCUTANEOUS | Status: AC
  Administered 2019-03-23: 10:00:00 10 [IU] via SUBCUTANEOUS
  Filled 2019-03-23: qty 1

## 2019-03-23 MED ORDER — INSULIN ASPART 100 UNIT/ML ~~LOC~~ SOLN: 10.0000 [IU] | Freq: Once | SUBCUTANEOUS | Status: AC

## 2019-03-23 MED ORDER — ONDANSETRON HCL 4 MG PO TABS: 4.0000 mg | ORAL_TABLET | Freq: Four times a day (QID) | ORAL | Status: DC | PRN

## 2019-03-23 MED ORDER — MIDAZOLAM HCL 2 MG/2ML IJ SOLN
INTRAMUSCULAR | Status: AC
  Filled 2019-03-23: qty 2

## 2019-03-23 MED ORDER — PROMETHAZINE HCL 25 MG/ML IJ SOLN: 6.2500 mg | INTRAMUSCULAR | Status: DC | PRN

## 2019-03-23 MED ORDER — FENTANYL CITRATE (PF) 100 MCG/2ML IJ SOLN
INTRAMUSCULAR | Status: DC | PRN
  Administered 2019-03-23 (×4): 25 ug via INTRAVENOUS

## 2019-03-23 MED ORDER — FAMOTIDINE 20 MG PO TABS: 20.0000 mg | ORAL_TABLET | Freq: Once | ORAL | Status: AC

## 2019-03-23 MED ORDER — MIDAZOLAM HCL 2 MG/2ML IJ SOLN
INTRAMUSCULAR | Status: DC | PRN
  Administered 2019-03-23: 2 mg via INTRAVENOUS

## 2019-03-23 MED ORDER — LIDOCAINE HCL (CARDIAC) PF 100 MG/5ML IV SOSY
PREFILLED_SYRINGE | INTRAVENOUS | Status: DC | PRN
  Administered 2019-03-23: 60 mg via INTRAVENOUS

## 2019-03-23 MED ORDER — SODIUM CHLORIDE 0.9 % IV SOLN: INTRAVENOUS | Status: DC

## 2019-03-23 MED ORDER — CHLORHEXIDINE GLUCONATE 4 % EX LIQD
60.0000 mL | Freq: Once | CUTANEOUS | Status: AC
  Administered 2019-03-23: 4 via TOPICAL

## 2019-03-23 MED ORDER — METOCLOPRAMIDE HCL 5 MG/ML IJ SOLN: 5.0000 mg | Freq: Three times a day (TID) | INTRAMUSCULAR | Status: DC | PRN

## 2019-03-23 MED ORDER — FENTANYL CITRATE (PF) 100 MCG/2ML IJ SOLN: 25.0000 ug | INTRAMUSCULAR | Status: DC | PRN

## 2019-03-23 MED ORDER — ONDANSETRON HCL 4 MG/2ML IJ SOLN: 4.0000 mg | Freq: Four times a day (QID) | INTRAMUSCULAR | Status: DC | PRN

## 2019-03-23 MED ORDER — ACETAMINOPHEN 10 MG/ML IV SOLN
INTRAVENOUS | Status: DC | PRN
  Administered 2019-03-23: 1000 mg via INTRAVENOUS

## 2019-03-23 MED ORDER — BUPIVACAINE HCL (PF) 0.5 % IJ SOLN
INTRAMUSCULAR | Status: AC
  Filled 2019-03-23: qty 30

## 2019-03-23 MED ORDER — DEXAMETHASONE SODIUM PHOSPHATE 10 MG/ML IJ SOLN
INTRAMUSCULAR | Status: AC
  Filled 2019-03-23: qty 1

## 2019-03-23 MED ORDER — ONDANSETRON HCL 4 MG/2ML IJ SOLN
INTRAMUSCULAR | Status: AC
  Filled 2019-03-23: qty 2

## 2019-03-23 MED ORDER — CEFAZOLIN SODIUM-DEXTROSE 2-4 GM/100ML-% IV SOLN
2.0000 g | INTRAVENOUS | Status: AC
  Administered 2019-03-23: 2 g via INTRAVENOUS

## 2019-03-23 MED ORDER — ONDANSETRON HCL 4 MG/2ML IJ SOLN
INTRAMUSCULAR | Status: DC | PRN
  Administered 2019-03-23: 4 mg via INTRAVENOUS

## 2019-03-23 MED ORDER — HYDROCODONE-ACETAMINOPHEN 5-325 MG PO TABS: 1.0000 | ORAL_TABLET | Freq: Four times a day (QID) | ORAL | 0 refills | Status: DC | PRN

## 2019-03-23 MED ORDER — LIDOCAINE HCL (PF) 2 % IJ SOLN
INTRAMUSCULAR | Status: AC
  Filled 2019-03-23: qty 10

## 2019-03-23 MED ORDER — FENTANYL CITRATE (PF) 100 MCG/2ML IJ SOLN
INTRAMUSCULAR | Status: AC
  Filled 2019-03-23: qty 2

## 2019-03-23 MED ORDER — PROPOFOL 10 MG/ML IV BOLUS
INTRAVENOUS | Status: DC | PRN
  Administered 2019-03-23: 180 mg via INTRAVENOUS

## 2019-03-23 MED ORDER — PROPOFOL 10 MG/ML IV BOLUS
INTRAVENOUS | Status: AC
  Filled 2019-03-23: qty 20

## 2019-03-23 MED ORDER — HYDROCODONE-ACETAMINOPHEN 5-325 MG PO TABS: 1.0000 | ORAL_TABLET | ORAL | Status: DC | PRN

## 2019-03-23 MED ORDER — METOPROLOL SUCCINATE ER 25 MG PO TB24
50.0000 mg | ORAL_TABLET | Freq: Every day | ORAL | Status: DC
  Administered 2019-03-23: 50 mg via ORAL
  Filled 2019-03-23: qty 2

## 2019-03-23 MED ORDER — CEFAZOLIN SODIUM-DEXTROSE 2-4 GM/100ML-% IV SOLN
INTRAVENOUS | Status: AC
  Filled 2019-03-23: qty 100

## 2019-03-23 MED ORDER — METOCLOPRAMIDE HCL 10 MG PO TABS: 5.0000 mg | ORAL_TABLET | Freq: Three times a day (TID) | ORAL | Status: DC | PRN

## 2019-03-23 MED ORDER — EPINEPHRINE PF 1 MG/ML IJ SOLN
INTRAMUSCULAR | Status: AC
  Filled 2019-03-23: qty 1

## 2019-03-23 SURGICAL SUPPLY — 42 items
BLADE SURG SZ10 CARB STEEL (BLADE) ×4 IMPLANT
BNDG COHESIVE 4X5 TAN STRL (GAUZE/BANDAGES/DRESSINGS) ×2 IMPLANT
BNDG ELASTIC 4X5.8 VLCR STR LF (GAUZE/BANDAGES/DRESSINGS) ×2 IMPLANT
BNDG ELASTIC 6X5.8 VLCR STR LF (GAUZE/BANDAGES/DRESSINGS) ×1 IMPLANT
BNDG ESMARK 6X12 TAN STRL LF (GAUZE/BANDAGES/DRESSINGS) ×2 IMPLANT
BRACE KNEE POST OP SHORT (BRACE) ×1 IMPLANT
CANISTER SUCT 1200ML W/VALVE (MISCELLANEOUS) ×2 IMPLANT
CHLORAPREP W/TINT 26 (MISCELLANEOUS) ×2 IMPLANT
COOLER POLAR GLACIER W/PUMP (MISCELLANEOUS) ×1 IMPLANT
COVER WAND RF STERILE (DRAPES) ×2 IMPLANT
DRAPE SPLIT 6X30 W/TAPE (DRAPES) ×4 IMPLANT
ELECT REM PT RETURN 9FT ADLT (ELECTROSURGICAL) ×2
ELECTRODE REM PT RTRN 9FT ADLT (ELECTROSURGICAL) ×1 IMPLANT
GAUZE SPONGE 4X4 12PLY STRL (GAUZE/BANDAGES/DRESSINGS) ×2 IMPLANT
GLOVE BIO SURGEON STRL SZ8 (GLOVE) ×4 IMPLANT
GLOVE INDICATOR 8.0 STRL GRN (GLOVE) ×2 IMPLANT
GOWN STRL REUS W/ TWL LRG LVL3 (GOWN DISPOSABLE) ×2 IMPLANT
GOWN STRL REUS W/ TWL XL LVL3 (GOWN DISPOSABLE) ×1 IMPLANT
GOWN STRL REUS W/TWL LRG LVL3 (GOWN DISPOSABLE) ×2
GOWN STRL REUS W/TWL XL LVL3 (GOWN DISPOSABLE) ×1
KIT TURNOVER KIT A (KITS) ×2 IMPLANT
NDL FILTER BLUNT 18X1 1/2 (NEEDLE) ×1 IMPLANT
NEEDLE FILTER BLUNT 18X 1/2SAF (NEEDLE) ×1
NEEDLE FILTER BLUNT 18X1 1/2 (NEEDLE) ×1 IMPLANT
NS IRRIG 500ML POUR BTL (IV SOLUTION) ×2 IMPLANT
PACK EXTREMITY ARMC (MISCELLANEOUS) ×2 IMPLANT
PAD CAST CTTN 4X4 STRL (SOFTGOODS) ×2 IMPLANT
PAD WRAPON POLAR KNEE (MISCELLANEOUS) ×1 IMPLANT
PADDING CAST COTTON 4X4 STRL (SOFTGOODS)
SPONGE LAP 18X18 RF (DISPOSABLE) ×1 IMPLANT
STAPLER SKIN PROX 35W (STAPLE) ×1 IMPLANT
STOCKINETTE IMPERVIOUS 9X36 MD (GAUZE/BANDAGES/DRESSINGS) ×2 IMPLANT
STOCKINETTE M/LG 89821 (MISCELLANEOUS) ×1 IMPLANT
SUT ETHIBOND #5 BRAIDED 30INL (SUTURE) ×1 IMPLANT
SUT ETHIBOND CT1 BRD #0 30IN (SUTURE) ×2 IMPLANT
SUT VIC AB 2-0 CT1 27 (SUTURE) ×2
SUT VIC AB 2-0 CT1 TAPERPNT 27 (SUTURE) ×2 IMPLANT
SUT VIC AB 3-0 SH 27 (SUTURE) ×1
SUT VIC AB 3-0 SH 27X BRD (SUTURE) ×1 IMPLANT
SYR 10ML LL (SYRINGE) ×2 IMPLANT
SYR 20ML LL LF (SYRINGE) ×2 IMPLANT
WRAPON POLAR PAD KNEE (MISCELLANEOUS)

## 2019-03-23 NOTE — Discharge Instructions (Addendum)
Crutch Use, Adult Crutches are used to take weight off of one of your legs or feet when you stand or walk. You may need crutches to help you heal after an injury or procedure. It is important to use crutches that fit properly. When fitted properly:  Each crutch should be 2-3 finger widths below the armpit.  Your weight should be supported by your hand and not by resting your armpit on the crutch. It is important that a health care provider has seen you use crutches effectively before you use them at home. What are the risks? Improper use of crutches can injure your shoulders, arms, back, armpits, wrists, and hands. To prevent this from happening, make sure your crutches fit properly, and do not put pressure on your armpits when using the crutches. While using crutches, you also have a higher risk of falling. To prevent falls while using crutches at home or work:  Move furniture or barriers that are in your walkway when possible. Have someone help you with this as needed.  Remove rugs, cords, and other items that you can trip on. Have someone help you with this as needed.  Keep walkways well lit.  Use a backpack so you do not need to carry items in your hands. How to use your crutches How you use your crutches will depend on the reason you need them. Your health care provider may tell you not to put any weight on the affected leg (non-weight-bearing). Or your health care provider may allow you to put some, but not all, weight on the affected leg (partial weight-bearing). Follow instructions from your health care provider about weight-bearing. Do not bear weight in an amount that causes pain to the affected area. Walking  1. Stand on your healthy leg and lift both crutches at the same time. 2. Place the crutches one step-length in front of you. Keep your weight over the hand grips. 3. Bring your healthy leg forward to meet, or land slightly ahead of, the crutches. 4. Repeat. Going up  steps If there is no handrail: 1. Walk up to steps and put weight on hand grips to step up. 2. Step up with the healthy leg. 3. Step up with the crutches and injured leg. 4. Repeat. If there is a handrail: 1. Hold both crutches in one hand. 2. Place your other hand on the handrail. 3. Place your weight on your arms and step up with your healthy leg. 4. Bring the crutches and the injured leg up to that step. 5. Continue in this way. If you feel unsteady on steps, you can go up steps on your buttocks. To go up, sit on the lowest step with your injured leg in front and holding both crutches flat against the stairs in one hand. Then use your free hand and your healthy leg for support to scoot your buttocks up to the next step. Going down steps If there is no handrail: 1. Step down with the injured leg and crutches. Make sure to keep the crutch tips in the center of the step, not close to the front edge. 2. Step down with the healthy leg. 3. Repeat. If there is a handrail: 1. Place one hand on the handrail. 2. Hold both crutches with your free hand. 3. Lower your injured leg and crutches to the step below you. Make sure to keep the crutch tips in the center of the step, not close to the front edge. 4. Lower your healthy leg down  to the next step. 5. Repeat. If you feel unsteady on steps, you can go down steps on your buttocks. To go down, sit on the highest step with your injured leg in front and holding both crutches flat against the stairs in the other hand. Then use your free hand and your healthy leg for support to scoot your buttocks down to the next step. Standing up  Move to the edge of the seat. If there is an armrest: 1. Hold the injured leg forward. 2. Grab the armrest with one hand and the top of the crutches with the other hand. 3. Using the armrest and your crutches, pull yourself up to a standing position. If there is no armrest: 1. Hold the injured leg forward. 2. Hold on  to the seat with one hand and the top of the crutches with the other hand. 3. Using the seat and your crutches, bring yourself up to a standing position. Sitting down  Move back until your leg touches the edge of the seat. If there is an armrest: 1. Hold the injured leg forward. 2. Grab the armrest with one hand and the top of the crutches with the other hand. 3. Slowly lower yourself to a sitting position. If there is no armrest: 1. Hold the injured leg forward. 2. Reach for and hold on to the seat with one hand and hold on to the top of the crutches with the other hand. 3. Slowly lower yourself to a sitting position. Contact a health care provider if:  You feel unsteady using crutches.  You develop any new pain.  You develop any numbness or tingling.  Your crutches do not fit. Get help right away if:  You fall. Summary  Crutches are used when you need to take weight off of one of your legs or feet to stand or walk.  To prevent injury, make sure your crutches fit properly, and do not put pressure on your armpits when using the crutches.  Follow instructions from your health care provider about weight-bearing. This information is not intended to replace advice given to you by your health care provider. Make sure you discuss any questions you have with your health care provider. Document Revised: 07/15/2018 Document Reviewed: 07/15/2018 Elsevier Patient Education  [place]   1) The drugs that you were given will stay in your system until tomorrow so for the next 24 hours you should not:  A) Drive an automobile B) Make any legal decisions C) Drink any alcoholic beverage   2) You may resume regular meals tomorrow.  Today it is better to start with liquids and gradually work up to solid foods.  You may eat anything you prefer, but it is better to start with liquids, then soup and crackers, and gradually work up to solid  foods.   3) Please notify your doctor immediately if you have any unusual bleeding, trouble breathing, redness and pain at the surgery site, drainage, fever, or pain not relieved by medication.    4) Additional Instructions:        Please contact your physician with any problems or Same Day Surgery at [phone], Monday through Friday 6 am to 4 pm, or [place] at [facility] number at [phone].  Orthopedic discharge instructions: Keep dressing dry and intact.  May shower after dressing changed on post-op day #4 (Saturday).  Cover staples/sutures with Band-Aids after drying off. Apply ice frequently to knee with hand towel  or wash cloth between skin and ice. Take pain medication as prescribed or ES Tylenol when needed.  May weight-bear as tolerated - use crutches or walker as needed. Follow-up in 10-14 days or as scheduled.

## 2019-03-23 NOTE — Progress Notes (Signed)
Left foot pink in color, warm to touch and pedal pulse good strong qulity. +3.

## 2019-03-23 NOTE — H&P (Signed)
Paper H&P to be scanned into permanent record. H&P reviewed and patient re-examined. No changes. 

## 2019-03-23 NOTE — Anesthesia Preprocedure Evaluation (Signed)
Anesthesia Evaluation  Patient identified by MRN, date of birth, ID band Patient awake    Reviewed: Allergy & Precautions, H&P , NPO status , Patient's Chart, lab work & pertinent test results, reviewed documented beta blocker date and time   History of Anesthesia Complications Negative for: history of anesthetic complications  Airway Mallampati: II  TM Distance: >3 FB Neck ROM: full    Dental  (+) Missing, Teeth Intact, Dental Advidsory Given   Pulmonary neg shortness of breath, asthma (as a child) , neg sleep apnea, neg COPD, neg recent URI,    Pulmonary exam normal        Cardiovascular Exercise Tolerance: Good hypertension, (-) angina(-) Past MI and (-) Cardiac Stents Normal cardiovascular exam(-) dysrhythmias (-) Valvular Problems/Murmurs     Neuro/Psych neg Seizures PSYCHIATRIC DISORDERS Anxiety Depression  Neuromuscular disease (neuropathy)    GI/Hepatic Neg liver ROS, GERD  ,  Endo/Other  diabetes, Poorly Controlled, Type 1  Renal/GU negative Renal ROS  negative genitourinary   Musculoskeletal   Abdominal   Peds  Hematology negative hematology ROS (+)   Anesthesia Other Findings Past Medical History: No date: Anemia No date: Anxiety No date: Asthma No date: Atopic dermatitis No date: Atypical chest pain No date: Bronchopneumonia No date: Depression No date: Diabetes mellitus without complication (HCC)     Comment:  a. Dx @ age 48. No date: Gastroparesis No date: GERD (gastroesophageal reflux disease) No date: Hidradenitis suppurativa No date: Hypertension No date: Obesity No date: Syncope     Comment:  a. Recurrent - in setting of hypoglycemia; b. 12/2015               Echo: EF EF 60-65%, no rwma, mild conc hypertrophy w/               near cavity obliteration in systole.  Nl RV fxn.   Reproductive/Obstetrics negative OB ROS                             Anesthesia  Physical Anesthesia Plan  ASA: II  Anesthesia Plan: General   Post-op Pain Management:    Induction: Intravenous  PONV Risk Score and Plan: 3 and Ondansetron, Dexamethasone, Midazolam and Promethazine  Airway Management Planned: LMA  Additional Equipment:   Intra-op Plan:   Post-operative Plan: Extubation in OR  Informed Consent: I have reviewed the patients History and Physical, chart, labs and discussed the procedure including the risks, benefits and alternatives for the proposed anesthesia with the patient or authorized representative who has indicated his/her understanding and acceptance.     Dental Advisory Given  Plan Discussed with: Anesthesiologist, CRNA and Surgeon  Anesthesia Plan Comments:         Anesthesia Quick Evaluation

## 2019-03-23 NOTE — Op Note (Signed)
03/23/2019  11:49 AM  Patient:   Kristen Tran  Pre-Op Diagnosis:   Subacute peroneal neuropathy, left lower extremity.  Post-Op Diagnosis:   Same  Procedure:   Exploration/decompression of left common peroneal nerve.  Surgeon:   Maryagnes Amos, MD  Assistant:   Horris Latino, PA-C  Anesthesia:   General LMA  Findings:   As above.  Complications:   None  Fluids:   400 cc crystalloid  EBL:   0 cc  UOP:   None  TT:   33 minutes at 300 mmHg  Drains:   None  Closure:   2-0 Vicryl subcuticular sutures  Brief Clinical Note:   The patient is a 29 year old female who presented to the office last week with a 27-month history of a left dropfoot following an injury in which she was pulled by a dog and fell awkwardly onto the lateral aspect of her left knee and lower leg.  Shortly thereafter, she was unable to dorsiflex or evert her foot.  She eventually was seen by neurology who performed an EMG which confirmed the presence of a peroneal neuropathy.  She presents at this time for a decompression of the common peroneal nerve at the left knee.  Procedure:   The patient was brought into the operating room and lain in the supine position.  After adequate general laryngeal mask anesthesia was obtained, the patient's left lower extremity was prepped with ChloraPrep solution before being draped sterilely.  Preoperative antibiotics were administered.  A timeout was performed to verify the appropriate surgical site before the limb was exsanguinated and the tourniquet inflated to 300 mmHg.    An approximately 4 to 5 cm incision was made obliquely over the lateral aspect of the leg just below the fibular head.  This incision was carried down through the subcutaneous tissues to expose the fascia overlying the nerve and lateral compartment muscles.  Using careful blunt dissection, the nerve was exposed as it wrapped around the fibular neck.  The nerve was tracked distally, releasing  first the posterior peroneal fascial band superficial to the peroneal nerve, then the lateral soleus fascial band deep to the peroneal nerve, then the anterior peroneal fascial band, and finally the innominate fascial band.  Care was taken to avoid damaging the peroneal nerve and any associated vessels.  The nerve was then followed proximally, releasing any additional bands or adhesions until the nerve wrapped posterior to the lateral biceps tendon.  The nerve was followed further proximally using digital dissection to be sure that the nerve was freed up as far proximally as possible.  The wound was copiously irrigated with sterile saline solution before the wound was closed using 2-0 Vicryl subcuticular sutures.  Benzoin and Steri-Strips were applied to the skin before the incision site was injected with 10 cc of 0.5% Sensorcaine with epinephrine to help with postoperative analgesia.  A sterile bulky dressing was applied to the leg before the patient was awakened, extubated, and returned to the recovery room in satisfactory condition after tolerating the procedure well.

## 2019-03-23 NOTE — Transfer of Care (Signed)
Immediate Anesthesia Transfer of Care Note  Patient: Kristen Tran  Procedure(s) Performed: EXPLORATION/DEBRIDEMENT OF THE LEFT PERONEAL NERVE. (Left )  Patient Location: PACU  Anesthesia Type:General  Level of Consciousness: awake, alert  and oriented  Airway & Oxygen Therapy: Patient Spontanous Breathing and Patient connected to face mask oxygen  Post-op Assessment: Report given to RN and Post -op Vital signs reviewed and stable  Post vital signs: Reviewed and stable  Last Vitals:  Vitals Value Taken Time  BP 133/93 03/23/19 1201  Temp 36.4 C 03/23/19 1153  Pulse 81 03/23/19 1206  Resp 6 03/23/19 1203  SpO2 100 % 03/23/19 1206  Vitals shown include unvalidated device data.  Last Pain:  Vitals:   03/23/19 1200  TempSrc:   PainSc: 0-No pain         Complications: No apparent anesthesia complications

## 2019-03-23 NOTE — Anesthesia Procedure Notes (Signed)
Procedure Name: LMA Insertion Date/Time: 03/23/2019 10:40 AM Performed by: Henrietta Hoover, CRNA Pre-anesthesia Checklist: Patient identified, Patient being monitored, Timeout performed, Emergency Drugs available and Suction available Patient Re-evaluated:Patient Re-evaluated prior to induction Oxygen Delivery Method: Circle system utilized Preoxygenation: Pre-oxygenation with 100% oxygen Induction Type: IV induction Ventilation: Mask ventilation without difficulty LMA: LMA inserted LMA Size: 4.0 Tube type: Oral Number of attempts: 1 Placement Confirmation: positive ETCO2 and breath sounds checked- equal and bilateral Tube secured with: Tape Dental Injury: Teeth and Oropharynx as per pre-operative assessment

## 2019-03-23 NOTE — OR Nursing (Signed)
Crutch instruction given with return demonstration and written instructions.

## 2019-03-24 NOTE — Anesthesia Postprocedure Evaluation (Signed)
Anesthesia Post Note  Patient: Kristen Tran  Procedure(s) Performed: EXPLORATION/DEBRIDEMENT OF THE LEFT PERONEAL NERVE. (Left )  Patient location during evaluation: PACU Anesthesia Type: General Level of consciousness: awake and alert Pain management: pain level controlled Vital Signs Assessment: post-procedure vital signs reviewed and stable Respiratory status: spontaneous breathing, nonlabored ventilation, respiratory function stable and patient connected to nasal cannula oxygen Cardiovascular status: blood pressure returned to baseline and stable Postop Assessment: no apparent nausea or vomiting Anesthetic complications: no     Last Vitals:  Vitals:   03/23/19 1235 03/23/19 1306  BP: (!) 147/87 (!) 148/81  Pulse: 86 79  Resp: 16 18  Temp: 36.9 C 36.8 C  SpO2: 100% 100%    Last Pain:  Vitals:   03/23/19 1306  TempSrc: Temporal  PainSc: 2                  Lenard Simmer

## 2019-03-30 ENCOUNTER — Encounter
Admit: 2019-03-30 | Discharge: 2019-03-31 | Disposition: A | Discharge status: Home / Self Care | Payer: PRIVATE HEALTH INSURANCE

## 2019-03-30 DIAGNOSIS — M25473 Effusion, unspecified ankle: Principal | ICD-10-CM

## 2019-04-19 ENCOUNTER — Encounter: Payer: Self-pay | Admitting: Family Medicine

## 2019-04-19 ENCOUNTER — Telehealth: Payer: Self-pay | Admitting: Family Medicine

## 2019-04-20 ENCOUNTER — Other Ambulatory Visit: Payer: Self-pay | Admitting: Family Medicine

## 2019-04-20 MED ORDER — SULFAMETHOXAZOLE-TRIMETHOPRIM 800-160 MG PO TABS: 1.0000 | ORAL_TABLET | Freq: Two times a day (BID) | ORAL | 1 refills | Status: AC

## 2019-04-20 NOTE — Telephone Encounter (Signed)
Pt is scheduled for an OV next Friday 04/30/2019, requesting refill to last her until then. Please advise

## 2019-04-30 ENCOUNTER — Ambulatory Visit (INDEPENDENT_AMBULATORY_CARE_PROVIDER_SITE_OTHER): Payer: BLUE CROSS/BLUE SHIELD | Admitting: Family Medicine

## 2019-04-30 ENCOUNTER — Encounter: Payer: Self-pay | Admitting: Family Medicine

## 2019-04-30 ENCOUNTER — Other Ambulatory Visit: Payer: Self-pay

## 2019-04-30 ENCOUNTER — Ambulatory Visit (INDEPENDENT_AMBULATORY_CARE_PROVIDER_SITE_OTHER): Payer: BLUE CROSS/BLUE SHIELD

## 2019-04-30 VITALS — BP 145/94 | HR 106 | Temp 98.2°F | Wt 166.0 lb

## 2019-04-30 DIAGNOSIS — R55 Syncope and collapse: Secondary | ICD-10-CM

## 2019-04-30 DIAGNOSIS — R590 Localized enlarged lymph nodes: Secondary | ICD-10-CM | POA: Diagnosis not present

## 2019-04-30 DIAGNOSIS — F5102 Adjustment insomnia: Secondary | ICD-10-CM

## 2019-04-30 DIAGNOSIS — I10 Essential (primary) hypertension: Secondary | ICD-10-CM

## 2019-04-30 MED ORDER — CYCLOBENZAPRINE HCL 10 MG PO TABS: 10.0000 mg | ORAL_TABLET | Freq: Every evening | ORAL | 0 refills | Status: DC | PRN

## 2019-04-30 MED ORDER — BELSOMRA 10 MG PO TABS: 10.0000 mg | ORAL_TABLET | Freq: Every evening | ORAL | 5 refills | Status: AC | PRN

## 2019-04-30 NOTE — Progress Notes (Signed)
BP (!) 145/94   Pulse (!) 106   Temp 98.2 F (36.8 C) (Oral)   Wt 166 lb (75.3 kg)   SpO2 99%   BMI 27.62 kg/m    Subjective:    Patient ID: Kristen Tran, female    DOB: [birth year], 29 y.o.   MRN: [id number]  HPI: Kristen Tran is a 29 y.o. female  Chief Complaint  Patient presents with  . Adenopathy    pt states has had right side lymph swollen x a month ago   Presenting today following up on some abscesses that had formed in her mouth and right throat about a month ago. After 2 rounds of bactrim finally feels everything has dissipated but still having some mildly swollen right neck lymph nodes. Denies swallowing, breathing issues and not having any fevers.   Still benefiting from belsomra, now taking nightly and notes excellent sleep and relaxation benefit. Denies side effects.   Home BPs running 120s/70s-80s back on amlodipine. Always runs high at appts. Denies CP, SOB, HAs, dizziness.   Relevant past medical, surgical, family and social history reviewed and updated as indicated. Interim medical history since our last visit reviewed. Allergies and medications reviewed and updated.  Review of Systems  Per HPI unless specifically indicated above     Objective:    BP (!) 145/94   Pulse (!) 106   Temp 98.2 F (36.8 C) (Oral)   Wt 166 lb (75.3 kg)   SpO2 99%   BMI 27.62 kg/m   Wt Readings from Last 3 Encounters:  04/30/19 166 lb (75.3 kg)  03/19/19 166 lb 9.6 oz (75.6 kg)  02/19/19 167 lb 3.2 oz (75.8 kg)    Physical Exam Vitals and nursing note reviewed.  Constitutional:      Appearance: Normal appearance. She is not ill-appearing.  HENT:     Head: Atraumatic.     Mouth/Throat:     Comments: No current abscesses noted in mouth Eyes:     Extraocular Movements: Extraocular movements intact.     Conjunctiva/sclera: Conjunctivae normal.  Cardiovascular:     Rate and Rhythm: Normal rate and regular rhythm.     Heart  sounds: Normal heart sounds.  Pulmonary:     Effort: Pulmonary effort is normal.     Breath sounds: Normal breath sounds.  Musculoskeletal:        General: Normal range of motion.     Cervical back: Normal range of motion and neck supple. No tenderness.  Lymphadenopathy:     Cervical: Cervical adenopathy (minimal diffuse right cervical adenopathy) present.  Skin:    General: Skin is warm and dry.  Neurological:     Mental Status: She is alert and oriented to person, place, and time.  Psychiatric:        Mood and Affect: Mood normal.        Thought Content: Thought content normal.        Judgment: Judgment normal.    Results for orders placed or performed during the hospital encounter of 03/23/19  Basic metabolic panel  Result Value Ref Range   Sodium 138 135 - 145 mmol/L   Potassium 3.4 (L) 3.5 - 5.1 mmol/L   Chloride 100 98 - 111 mmol/L   CO2 27 22 - 32 mmol/L   Glucose, Bld 384 (H) 70 - 99 mg/dL   BUN 13 6 - 20 mg/dL   Creatinine, Ser 3.01 0.44 - 1.00 mg/dL   Calcium 9.1 8.9 - 10.3  mg/dL   GFR calc non Af Amer >60 >60 mL/min   GFR calc Af Amer >60 >60 mL/min   Anion gap 11 5 - 15  CBC  Result Value Ref Range   WBC 6.0 4.0 - 10.5 K/uL   RBC 4.79 3.87 - 5.11 MIL/uL   Hemoglobin 15.3 (H) 12.0 - 15.0 g/dL   HCT 42.8 36.0 - 46.0 %   MCV 89.4 80.0 - 100.0 fL   MCH 31.9 26.0 - 34.0 pg   MCHC 35.7 30.0 - 36.0 g/dL   RDW 11.1 (L) 11.5 - 15.5 %   Platelets 283 150 - 400 K/uL   nRBC 0.0 0.0 - 0.2 %  Glucose, capillary  Result Value Ref Range   Glucose-Capillary 346 (H) 70 - 99 mg/dL  Glucose, capillary  Result Value Ref Range   Glucose-Capillary 322 (H) 70 - 99 mg/dL  Glucose, capillary  Result Value Ref Range   Glucose-Capillary 252 (H) 70 - 99 mg/dL  Pregnancy, urine POC  Result Value Ref Range   Preg Test, Ur NEGATIVE NEGATIVE      Assessment & Plan:   Problem List Items Addressed This Visit      Cardiovascular and Mediastinum   Hypertension - Primary     High today, but home readings consistently WNL. Continue current regimen        Other   Insomnia    Stable and well controlled, continue current regimen       Other Visit Diagnoses    Cervical adenopathy       Suspect residual from recent mouth infection. Continue to monitor, if not resolving will recheck and consider u/s       Follow up plan: Return for as scheduled.

## 2019-04-30 NOTE — Assessment & Plan Note (Signed)
High today, but home readings consistently WNL. Continue current regimen

## 2019-05-01 NOTE — Assessment & Plan Note (Signed)
Stable and well controlled, continue current regimen 

## 2019-05-03 ENCOUNTER — Telehealth: Payer: Self-pay

## 2019-05-03 DIAGNOSIS — R55 Syncope and collapse: Secondary | ICD-10-CM

## 2019-05-03 NOTE — Telephone Encounter (Signed)
-----   Message from Sondra Barges, [place] sent at 05/01/2019  4:02 PM EDT ----- Echo showed increased pump function, mild thickening of the heart, and stiffening of the heart.   Recommendations: -Please schedule cardiac MRI -Please refer to be evaluated by EP (appointment after cardiac MRI for review of study and consideration for loop recorder given recurrent syncope)

## 2019-05-03 NOTE — Telephone Encounter (Signed)
Attempted to call patient. LMTCB 05/03/2019   

## 2019-05-03 NOTE — Telephone Encounter (Signed)
Patient calling to discuss recent testing results   Please call . If no answer patient wants detailed results on vm.

## 2019-05-04 NOTE — Telephone Encounter (Signed)
Attempted to call patient. [facility] 05/04/2019  Your physician has requested that you have a cardiac MRI. Cardiac MRI uses a computer to create images of your heart as its beating, producing both still and moving pictures of your heart and major blood vessels. For further information please visit [email or web address]. Please follow the instruction sheet given to you today for more information.  You will be called by MRI scheduling for appt time and date after order has been through pre certification process.  You are scheduled for Cardiac MRI on _____ date. Please arrive at the [facility] main entrance of [facility] at ______ time (30-45 minutes prior to test start time). ?  [facility]  [address]  [place], [place] [address]  [phone]  Proceed to the [facility] Radiology Department (First Floor).  ?  Magnetic resonance imaging (MRI) is a painless test that produces images of the inside of the body without using X-rays. During an MRI, strong magnets and radio waves work together in a Data processing manager to form detailed images. MRI images may provide more details about a medical condition than X-rays, CT scans, and ultrasounds can provide.  You may be given earphones to listen for instructions.  You may eat a light breakfast and take medications as ordered with the exception of spironolactone (fluid pill, other). If a contrast material will be used, an IV will be inserted into one of your veins. Contrast material will be injected into your IV.  You will be asked to remove all metal, including: Watch, jewelry, and other metal objects including hearing aids, hair pieces and dentures. (Braces and fillings normally are not a problem.)  If contrast material was used:  It will leave your body through your urine within a day. You may be told to drink plenty of fluids to help flush the contrast material out of your system.  TEST WILL TAKE APPROXIMATELY 1 HOUR  PLEASE NOTIFY  SCHEDULING AT LEAST 24 HOURS IN ADVANCE IF YOU ARE UNABLE TO KEEP YOUR APPOINTMENT @ [phone]

## 2019-05-06 NOTE — Telephone Encounter (Signed)
Missed call from patient who has tight work schedule. She asked that I send information over in a mychart message. Message sent. Will need further correspondence based on her reply.

## 2019-05-12 NOTE — Telephone Encounter (Signed)
Call to patient to review echo results and POC from Eula Listen, [place].   Pt verbalized understanding and cooperative with POC.   Orders placed as advised.    Sent to precert.  Reviewed instructions verbally with patient, sent written copy via mychart.

## 2019-05-12 NOTE — Telephone Encounter (Signed)
Attempted to call patient. LMTCB 05/12/2019   

## 2019-05-13 ENCOUNTER — Ambulatory Visit: Admit: 2019-05-13 | Discharge: 2019-05-15 | Payer: PRIVATE HEALTH INSURANCE

## 2019-05-13 ENCOUNTER — Encounter: Admit: 2019-05-13 | Discharge: 2019-05-15 | Payer: PRIVATE HEALTH INSURANCE

## 2019-05-13 LAB — HEMOGLOBIN A1C: Hemoglobin A1C: 12

## 2019-05-13 NOTE — Telephone Encounter (Signed)
appt with Dr. Graciela Husbands confirmed via [place].   No further orders at this time.

## 2019-05-14 ENCOUNTER — Telehealth: Payer: Self-pay | Admitting: Internal Medicine

## 2019-05-14 MED ORDER — METOPROLOL SUCCINATE ER 100 MG TABLET,EXTENDED RELEASE 24 HR
ORAL_TABLET | Freq: Every morning | ORAL | 0 refills | 90 days | Status: CP
Start: 2019-05-14 — End: ?

## 2019-05-14 MED ORDER — ONDANSETRON 4 MG PO TBDP
4.00 | ORAL_TABLET | ORAL | Status: DC
Start: ? — End: 2019-05-14

## 2019-05-14 MED ORDER — METOCLOPRAMIDE HCL 10 MG PO TABS
10.00 | ORAL_TABLET | ORAL | Status: DC
Start: 2019-05-15 — End: 2019-05-14

## 2019-05-14 MED ORDER — INSULIN LISPRO 100 UNIT/ML ~~LOC~~ SOLN
0.00 | SUBCUTANEOUS | Status: DC
Start: 2019-05-14 — End: 2019-05-14

## 2019-05-14 MED ORDER — INSULIN GLARGINE 100 UNIT/ML ~~LOC~~ SOLN
40.00 | SUBCUTANEOUS | Status: DC
Start: 2019-05-15 — End: 2019-05-14

## 2019-05-14 MED ORDER — GABAPENTIN 300 MG PO CAPS
300.00 | ORAL_CAPSULE | ORAL | Status: DC
Start: ? — End: 2019-05-14

## 2019-05-14 MED ORDER — METFORMIN HCL 500 MG PO TABS
500.00 | ORAL_TABLET | ORAL | Status: DC
Start: 2019-05-15 — End: 2019-05-14

## 2019-05-14 MED ORDER — ATORVASTATIN CALCIUM 40 MG PO TABS
40.00 | ORAL_TABLET | ORAL | Status: DC
Start: 2019-05-15 — End: 2019-05-14

## 2019-05-14 MED ORDER — DEXTROSE 50 % IV SOLN
12.50 | INTRAVENOUS | Status: DC
Start: ? — End: 2019-05-14

## 2019-05-14 MED ORDER — ACETAMINOPHEN 325 MG PO TABS
650.00 | ORAL_TABLET | ORAL | Status: DC
Start: ? — End: 2019-05-14

## 2019-05-14 MED ORDER — CEFDINIR 300 MG PO CAPS
300.00 | ORAL_CAPSULE | ORAL | Status: DC
Start: 2019-05-15 — End: 2019-05-14

## 2019-05-14 MED ORDER — PANTOPRAZOLE SODIUM 20 MG PO TBEC
20.00 | DELAYED_RELEASE_TABLET | ORAL | Status: DC
Start: 2019-05-15 — End: 2019-05-14

## 2019-05-14 MED ORDER — DIPHENHYDRAMINE HCL 25 MG PO CAPS
25.00 | ORAL_CAPSULE | ORAL | Status: DC
Start: ? — End: 2019-05-14

## 2019-05-14 MED ORDER — AMLODIPINE BESYLATE 5 MG PO TABS
5.00 | ORAL_TABLET | ORAL | Status: DC
Start: 2019-05-15 — End: 2019-05-14

## 2019-05-14 NOTE — Telephone Encounter (Signed)
Left message for patient to call regarding the Cardiac MRI ordered by Dr. Graciela Husbands

## 2019-05-15 MED ORDER — CEFDINIR 300 MG CAPSULE
ORAL_CAPSULE | Freq: Two times a day (BID) | ORAL | 0 refills | 1.00000 days | Status: CP
Start: 2019-05-15 — End: 2019-05-16

## 2019-05-18 NOTE — Telephone Encounter (Signed)
Left message for patient to call regarding preferred dates and times for Cardiac MRI ordered by D. Graciela Husbands

## 2019-05-19 DIAGNOSIS — I456 Pre-excitation syndrome: Principal | ICD-10-CM

## 2019-05-19 NOTE — Telephone Encounter (Signed)
Spoke with patient at 2:02pm---she states she was admitted to the hospital at [facility] and the cardiac mri was done while she was there.

## 2019-05-24 ENCOUNTER — Other Ambulatory Visit: Payer: Self-pay | Admitting: Nurse Practitioner

## 2019-05-24 ENCOUNTER — Encounter: Payer: Self-pay | Admitting: Family Medicine

## 2019-05-24 ENCOUNTER — Ambulatory Visit (INDEPENDENT_AMBULATORY_CARE_PROVIDER_SITE_OTHER): Payer: BLUE CROSS/BLUE SHIELD | Admitting: Family Medicine

## 2019-05-24 ENCOUNTER — Other Ambulatory Visit: Payer: Self-pay

## 2019-05-24 VITALS — BP 155/107 | HR 89 | Temp 98.3°F | Wt 160.0 lb

## 2019-05-24 DIAGNOSIS — I10 Essential (primary) hypertension: Secondary | ICD-10-CM

## 2019-05-24 DIAGNOSIS — R55 Syncope and collapse: Secondary | ICD-10-CM

## 2019-05-24 DIAGNOSIS — E1165 Type 2 diabetes mellitus with hyperglycemia: Secondary | ICD-10-CM

## 2019-05-24 DIAGNOSIS — Z794 Long term (current) use of insulin: Secondary | ICD-10-CM

## 2019-05-24 MED ORDER — NOVOLOG FLEXPEN 100 UNIT/ML ~~LOC~~ SOPN: 5.0000 [IU] | PEN_INJECTOR | Freq: Three times a day (TID) | SUBCUTANEOUS | 0 refills | Status: AC

## 2019-05-24 MED ORDER — TRESIBA FLEXTOUCH 100 UNIT/ML ~~LOC~~ SOPN: 60.0000 [IU] | PEN_INJECTOR | Freq: Every day | SUBCUTANEOUS | 0 refills | Status: AC

## 2019-05-24 NOTE — Progress Notes (Signed)
BP (!) 155/107   Pulse 89   Temp 98.3 F (36.8 C) (Oral)   Wt 160 lb (72.6 kg)   SpO2 99%   BMI 26.63 kg/m    Subjective:    Patient ID: Kristen Tran, female    DOB: 09/04/1990, 29 y.o.   MRN: [id number]  HPI: Kristen Tran is a 29 y.o. female  Chief Complaint  Patient presents with  . Hospitalization Follow-up    Here today for hospital follow up for syncopal episode. 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 Has scheduled outpt Cardiac follow up with Dr. Graciela Husbands for possible ablation. Med change in hospital was only increase in metoprolol to 100 mg XL. Was also found to have a UTI while admitted and was on IV abx, d/c'd home on cefdinir which she's completed with no residual sxs.   Home BPs have been 120s/80s consistently.  Has white coat HTN and notes BPs seem to always shoot up when she's here in the office.   DM - A1C cntinues to be under poor control, A1C of 12 during recent admission. Followed by Endocrinology for this. States she is completely out of insulin   Relevant past medical, surgical, family and social history reviewed and updated as indicated. Interim medical history since our last visit reviewed. Allergies and medications reviewed and updated.  Review of Systems  Per HPI unless specifically indicated above     Objective:    BP (!) 155/107   Pulse 89   Temp 98.3 F (36.8 C) (Oral)   Wt 160 lb (72.6 kg)   SpO2 99%   BMI 26.63 kg/m   Wt Readings from Last 3 Encounters:  05/24/19 160 lb (72.6 kg)  04/30/19 166 lb (75.3 kg)  03/19/19 166 lb 9.6 oz (75.6 kg)    Physical Exam Vitals and nursing note reviewed.  Constitutional:      Appearance: Normal appearance. She is not ill-appearing.  HENT:     Head: Atraumatic.  Eyes:     Extraocular Movements: Extraocular movements intact.     Conjunctiva/sclera: Conjunctivae normal.  Cardiovascular:     Rate and Rhythm: Normal rate and regular rhythm.       Heart sounds: Normal heart sounds.  Pulmonary:     Effort: Pulmonary effort is normal.     Breath sounds: Normal breath sounds.  Musculoskeletal:        General: Normal range of motion.     Cervical back: Normal range of motion and neck supple.  Skin:    General: Skin is warm and dry.  Neurological:     Mental Status: She is alert and oriented to person, place, and time.  Psychiatric:        Mood and Affect: Mood normal.        Thought Content: Thought content normal.        Judgment: Judgment normal.     Results for orders placed or performed during the hospital encounter of 03/23/19  Basic metabolic panel  Result Value Ref Range   Sodium 138 135 - 145 mmol/L   Potassium 3.4 (L) 3.5 - 5.1 mmol/L   Chloride 100 98 - 111 mmol/L   CO2 27 22 - 32 mmol/L   Glucose, Bld 384 (H) 70 - 99 mg/dL   BUN 13 6 - 20 mg/dL   Creatinine, Ser 3.87 0.44 - 1.00 mg/dL   Calcium 9.1 8.9 - 56.4 mg/dL   GFR calc non  Af Amer >60 >60 mL/min   GFR calc Af Amer >60 >60 mL/min   Anion gap 11 5 - 15  CBC  Result Value Ref Range   WBC 6.0 4.0 - 10.5 K/uL   RBC 4.79 3.87 - 5.11 MIL/uL   Hemoglobin 15.3 (H) 12.0 - 15.0 g/dL   HCT 42.8 36.0 - 46.0 %   MCV 89.4 80.0 - 100.0 fL   MCH 31.9 26.0 - 34.0 pg   MCHC 35.7 30.0 - 36.0 g/dL   RDW 11.1 (L) 11.5 - 15.5 %   Platelets 283 150 - 400 K/uL   nRBC 0.0 0.0 - 0.2 %  Glucose, capillary  Result Value Ref Range   Glucose-Capillary 346 (H) 70 - 99 mg/dL  Glucose, capillary  Result Value Ref Range   Glucose-Capillary 322 (H) 70 - 99 mg/dL  Glucose, capillary  Result Value Ref Range   Glucose-Capillary 252 (H) 70 - 99 mg/dL  Pregnancy, urine POC  Result Value Ref Range   Preg Test, Ur NEGATIVE NEGATIVE      Assessment & Plan:   Problem List Items Addressed This Visit      Cardiovascular and Mediastinum   Hypertension    Readings typically elevated in clinic but WNL home readings, per patient significant anxiety with clinic setting. 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 Has outpatient Cardiology f/u coming up to discuss mgmt. Continue increased metoprolol and close monitoring for now.         Endocrine   Diabetes mellitus ([place])    Poorly controlled, followed by Endocrinology. Per patient she's asked for insulin refill through her specialist but having trouble obtaining this and completely out of her medication. Will send bridge supply but strongly encouraged continued pursuit of a follow up for both refills and to discuss continued poor control      Relevant Medications   insulin aspart (NOVOLOG FLEXPEN) 100 UNIT/ML FlexPen   TRESIBA FLEXTOUCH 100 UNIT/ML FlexTouch Pen       Follow up plan: Return for as scheduled for 6 month f/u.

## 2019-05-26 NOTE — Assessment & Plan Note (Signed)
Readings typically elevated in clinic but WNL home readings, per patient significant anxiety with clinic setting. WIll continue to monitor on current regimen, has Cardiology f/u soon

## 2019-05-26 NOTE — Assessment & Plan Note (Signed)
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 Has outpatient Cardiology f/u coming up to discuss mgmt. Continue increased metoprolol and close monitoring for now.

## 2019-05-26 NOTE — Assessment & Plan Note (Signed)
Poorly controlled, followed by Endocrinology. Per patient she's asked for insulin refill through her specialist but having trouble obtaining this and completely out of her medication. Will send bridge supply but strongly encouraged continued pursuit of a follow up for both refills and to discuss continued poor control

## 2019-05-28 ENCOUNTER — Encounter: Admit: 2019-05-28 | Discharge: 2019-05-29 | Payer: PRIVATE HEALTH INSURANCE

## 2019-05-28 DIAGNOSIS — R55 Syncope and collapse: Principal | ICD-10-CM

## 2019-05-28 DIAGNOSIS — I456 Pre-excitation syndrome: Principal | ICD-10-CM

## 2019-06-04 ENCOUNTER — Ambulatory Visit: Admit: 2019-06-04 | Discharge: 2019-06-05 | Payer: PRIVATE HEALTH INSURANCE

## 2019-06-04 ENCOUNTER — Encounter
Admit: 2019-06-04 | Discharge: 2019-06-05 | Payer: PRIVATE HEALTH INSURANCE | Attending: Certified Registered" | Primary: Certified Registered"

## 2019-06-04 MED ORDER — SPIRONOLACTONE 100 MG TABLET
ORAL_TABLET | Freq: Every day | ORAL | 0 refills | 30 days
Start: 2019-06-04 — End: 2019-07-04

## 2019-06-04 MED ORDER — BLOOD-GLUCOSE METER KIT WRAPPER
1 refills | 0 days
Start: 2019-06-04 — End: 2020-06-03

## 2019-06-30 ENCOUNTER — Other Ambulatory Visit: Payer: Self-pay | Admitting: Unknown Physician Specialty

## 2019-06-30 ENCOUNTER — Other Ambulatory Visit: Payer: Self-pay | Admitting: Family Medicine

## 2019-06-30 NOTE — Telephone Encounter (Signed)
TC to patient to inquire her need for these anti-nausea medication. No answer Left VM to return call to speak with a nurse regarding her need for the requested medications. Patient recently had surgical procedure on 06/04/19. Last OV 05/24/19 Future OV 08/05/19. Routing to clinic for consideration/Medications not delegated for refill by the [facility].

## 2019-06-30 NOTE — Telephone Encounter (Signed)
Routing to provider  

## 2019-07-01 ENCOUNTER — Ambulatory Visit: Payer: BLUE CROSS/BLUE SHIELD | Admitting: Internal Medicine

## 2019-07-15 ENCOUNTER — Encounter: Payer: Self-pay | Admitting: Family Medicine

## 2019-07-30 ENCOUNTER — Encounter: Admit: 2019-07-30 | Payer: PRIVATE HEALTH INSURANCE

## 2019-08-04 ENCOUNTER — Encounter: Payer: BLUE CROSS/BLUE SHIELD | Admitting: Family Medicine

## 2019-08-05 ENCOUNTER — Encounter: Payer: BLUE CROSS/BLUE SHIELD | Admitting: Family Medicine

## 2019-08-30 ENCOUNTER — Telehealth: Payer: Self-pay | Admitting: Family Medicine

## 2019-08-30 MED ORDER — HYDROXYZINE HCL 25 MG PO TABS: 25.0000 mg | ORAL_TABLET | Freq: Three times a day (TID) | ORAL | 2 refills | Status: AC | PRN

## 2019-08-30 MED ORDER — PROMETHAZINE HCL 25 MG PO TABS: ORAL_TABLET | ORAL | 2 refills | Status: AC

## 2019-08-30 NOTE — Telephone Encounter (Signed)
Called and requested refill of hydroxyzine and phenergan. Refills sent

## 2019-09-20 ENCOUNTER — Other Ambulatory Visit: Payer: Self-pay | Admitting: Unknown Physician Specialty

## 2019-09-20 ENCOUNTER — Other Ambulatory Visit: Payer: Self-pay | Admitting: Family Medicine

## 2019-09-20 NOTE — Telephone Encounter (Signed)
Requested medication (s) are due for refill today:  Yes  Requested medication (s) are on the active medication list:  Yes  Future visit scheduled:  NO  Last Refill: 04/30/19; #90; no refills; previous pt. Of [facility]  Notes to clinic- not delegated  Requested Prescriptions  Pending Prescriptions Disp Refills   cyclobenzaprine (FLEXERIL) 10 MG tablet [Pharmacy Med Name: CYCLOBENZAPRINE 10MG  TABLETS] 90 tablet 0    Sig: TAKE 1 TABLET(10 MG) BY MOUTH AT BEDTIME AS NEEDED      Not Delegated - Analgesics:  Muscle Relaxants Failed - 09/20/2019  1:31 PM      Failed - This refill cannot be delegated      Passed - Valid encounter within last 6 months    Recent Outpatient Visits           3 months ago Syncope, unspecified syncope type   [facility] [place]   4 months ago Essential hypertension   [facility] [place], [place]   7 months ago Lower extremity numbness   [facility] [place]   7 months ago Essential hypertension   [facility], [place]   1 year ago Essential hypertension   [facility] [place]       Future Appointments             In 1 week [place], [place], MD [facility]

## 2019-09-20 NOTE — Telephone Encounter (Signed)
Previous RL patient. Patient last seen 05/24/19

## 2019-09-20 NOTE — Telephone Encounter (Signed)
Requested Prescriptions  Pending Prescriptions Disp Refills  . gabapentin (NEURONTIN) 300 MG capsule [Pharmacy Med Name: GABAPENTIN 300MG  CAPSULES] 90 capsule 0    Sig: TAKE 1 CAPSULE BY MOUTH THREE TIMES DAILY AS NEEDED     Neurology: Anticonvulsants - gabapentin Passed - 09/20/2019  1:31 PM      Passed - Valid encounter within last 12 months    Recent Outpatient Visits          3 months ago Syncope, unspecified syncope type   [facility] [place]   4 months ago Essential hypertension   [facility] [place], [place]   7 months ago Lower extremity numbness   [facility] [place]   7 months ago Essential hypertension   [facility] [place]   1 year ago Essential hypertension   [facility] [place]      Future Appointments            In 1 week [place], [place], MD [facility], [facility]

## 2019-10-01 ENCOUNTER — Ambulatory Visit: Payer: BLUE CROSS/BLUE SHIELD | Admitting: Cardiology

## 2019-10-27 ENCOUNTER — Other Ambulatory Visit: Payer: Self-pay | Admitting: Unknown Physician Specialty

## 2019-11-12 ENCOUNTER — Ambulatory Visit
Admit: 2019-11-12 | Discharge: 2019-11-14 | Disposition: A | Discharge status: Home / Self Care | Payer: PRIVATE HEALTH INSURANCE

## 2019-11-12 ENCOUNTER — Encounter
Admit: 2019-11-12 | Discharge: 2019-11-14 | Disposition: A | Discharge status: Home / Self Care | Payer: PRIVATE HEALTH INSURANCE

## 2019-11-12 DIAGNOSIS — R001 Bradycardia, unspecified: Principal | ICD-10-CM

## 2019-11-12 DIAGNOSIS — Z9049 Acquired absence of other specified parts of digestive tract: Principal | ICD-10-CM

## 2019-11-12 DIAGNOSIS — K3184 Gastroparesis: Principal | ICD-10-CM

## 2019-11-12 DIAGNOSIS — Z6824 Body mass index (BMI) 24.0-24.9, adult: Principal | ICD-10-CM

## 2019-11-12 DIAGNOSIS — E669 Obesity, unspecified: Principal | ICD-10-CM

## 2019-11-12 DIAGNOSIS — Z20822 Contact with and (suspected) exposure to covid-19: Principal | ICD-10-CM

## 2019-11-12 DIAGNOSIS — E875 Hyperkalemia: Principal | ICD-10-CM

## 2019-11-12 DIAGNOSIS — J45909 Unspecified asthma, uncomplicated: Principal | ICD-10-CM

## 2019-11-12 DIAGNOSIS — I2119 ST elevation (STEMI) myocardial infarction involving other coronary artery of inferior wall: Principal | ICD-10-CM

## 2019-11-12 DIAGNOSIS — Z794 Long term (current) use of insulin: Principal | ICD-10-CM

## 2019-11-12 DIAGNOSIS — E785 Hyperlipidemia, unspecified: Principal | ICD-10-CM

## 2019-11-12 DIAGNOSIS — I2111 ST elevation (STEMI) myocardial infarction involving right coronary artery: Principal | ICD-10-CM

## 2019-11-12 DIAGNOSIS — K219 Gastro-esophageal reflux disease without esophagitis: Principal | ICD-10-CM

## 2019-11-12 DIAGNOSIS — I25119 Atherosclerotic heart disease of native coronary artery with unspecified angina pectoris: Principal | ICD-10-CM

## 2019-11-12 DIAGNOSIS — I959 Hypotension, unspecified: Principal | ICD-10-CM

## 2019-11-12 DIAGNOSIS — Z91041 Radiographic dye allergy status: Principal | ICD-10-CM

## 2019-11-12 DIAGNOSIS — I10 Essential (primary) hypertension: Principal | ICD-10-CM

## 2019-11-12 DIAGNOSIS — E1065 Type 1 diabetes mellitus with hyperglycemia: Principal | ICD-10-CM

## 2019-11-12 DIAGNOSIS — E1042 Type 1 diabetes mellitus with diabetic polyneuropathy: Principal | ICD-10-CM

## 2019-11-12 DIAGNOSIS — D649 Anemia, unspecified: Principal | ICD-10-CM

## 2019-11-12 DIAGNOSIS — E1043 Type 1 diabetes mellitus with diabetic autonomic (poly)neuropathy: Principal | ICD-10-CM

## 2019-11-12 DIAGNOSIS — R55 Syncope and collapse: Principal | ICD-10-CM

## 2019-11-12 DIAGNOSIS — E1169 Type 2 diabetes mellitus with other specified complication: Principal | ICD-10-CM

## 2019-11-12 DIAGNOSIS — R079 Chest pain, unspecified: Principal | ICD-10-CM

## 2019-11-12 DIAGNOSIS — L732 Hidradenitis suppurativa: Principal | ICD-10-CM

## 2019-11-13 DIAGNOSIS — Z9049 Acquired absence of other specified parts of digestive tract: Principal | ICD-10-CM

## 2019-11-13 DIAGNOSIS — E1042 Type 1 diabetes mellitus with diabetic polyneuropathy: Principal | ICD-10-CM

## 2019-11-13 DIAGNOSIS — K3184 Gastroparesis: Principal | ICD-10-CM

## 2019-11-13 DIAGNOSIS — Z6824 Body mass index (BMI) 24.0-24.9, adult: Principal | ICD-10-CM

## 2019-11-13 DIAGNOSIS — R079 Chest pain, unspecified: Principal | ICD-10-CM

## 2019-11-13 DIAGNOSIS — D649 Anemia, unspecified: Principal | ICD-10-CM

## 2019-11-13 DIAGNOSIS — I10 Essential (primary) hypertension: Principal | ICD-10-CM

## 2019-11-13 DIAGNOSIS — E1065 Type 1 diabetes mellitus with hyperglycemia: Principal | ICD-10-CM

## 2019-11-13 DIAGNOSIS — E669 Obesity, unspecified: Principal | ICD-10-CM

## 2019-11-13 DIAGNOSIS — Z91041 Radiographic dye allergy status: Principal | ICD-10-CM

## 2019-11-13 DIAGNOSIS — Z20822 Contact with and (suspected) exposure to covid-19: Principal | ICD-10-CM

## 2019-11-13 DIAGNOSIS — E875 Hyperkalemia: Principal | ICD-10-CM

## 2019-11-13 DIAGNOSIS — I2119 ST elevation (STEMI) myocardial infarction involving other coronary artery of inferior wall: Principal | ICD-10-CM

## 2019-11-13 DIAGNOSIS — E1043 Type 1 diabetes mellitus with diabetic autonomic (poly)neuropathy: Principal | ICD-10-CM

## 2019-11-13 DIAGNOSIS — I959 Hypotension, unspecified: Principal | ICD-10-CM

## 2019-11-13 DIAGNOSIS — J45909 Unspecified asthma, uncomplicated: Principal | ICD-10-CM

## 2019-11-13 DIAGNOSIS — I25119 Atherosclerotic heart disease of native coronary artery with unspecified angina pectoris: Principal | ICD-10-CM

## 2019-11-13 DIAGNOSIS — E785 Hyperlipidemia, unspecified: Principal | ICD-10-CM

## 2019-11-13 DIAGNOSIS — K219 Gastro-esophageal reflux disease without esophagitis: Principal | ICD-10-CM

## 2019-11-14 MED ORDER — INSULIN ASPART (U-100) 100 UNIT/ML (3 ML) SUBCUTANEOUS PEN
Freq: Three times a day (TID) | SUBCUTANEOUS | 12 refills | 84 days | Status: CP
Start: 2019-11-14 — End: 2019-12-15

## 2019-11-14 MED ORDER — GABAPENTIN 300 MG CAPSULE
ORAL_CAPSULE | 0 refills | 0 days | Status: CP
Start: 2019-11-14 — End: 2019-12-13

## 2019-11-14 MED ORDER — ATORVASTATIN 80 MG TABLET
ORAL_TABLET | Freq: Every day | ORAL | 0 refills | 30.00000 days | Status: CP
Start: 2019-11-14 — End: 2019-12-14

## 2019-11-14 MED ORDER — SPIRONOLACTONE 100 MG TABLET
ORAL_TABLET | Freq: Every day | ORAL | 0 refills | 30.00000 days
Start: 2019-11-14 — End: 2019-12-13

## 2019-11-15 MED ORDER — LOSARTAN 25 MG TABLET
ORAL_TABLET | Freq: Every day | ORAL | 0 refills | 30 days | Status: CP
Start: 2019-11-15 — End: 2019-12-13

## 2019-11-15 MED ORDER — ASPIRIN 81 MG CHEWABLE TABLET
ORAL_TABLET | Freq: Every day | ORAL | 11 refills | 30 days | Status: CP
Start: 2019-11-15 — End: ?

## 2019-11-15 MED ORDER — PRASUGREL 10 MG TABLET
ORAL_TABLET | Freq: Every day | ORAL | 11 refills | 30 days | Status: CP
Start: 2019-11-15 — End: ?

## 2019-11-19 ENCOUNTER — Ambulatory Visit
Admit: 2019-11-19 | Discharge: 2019-11-20 | Disposition: A | Discharge status: Home / Self Care | Payer: PRIVATE HEALTH INSURANCE

## 2019-11-19 DIAGNOSIS — Z91048 Other nonmedicinal substance allergy status: Principal | ICD-10-CM

## 2019-11-19 DIAGNOSIS — Z886 Allergy status to analgesic agent status: Principal | ICD-10-CM

## 2019-11-19 DIAGNOSIS — Z794 Long term (current) use of insulin: Principal | ICD-10-CM

## 2019-11-19 DIAGNOSIS — Z79899 Other long term (current) drug therapy: Principal | ICD-10-CM

## 2019-11-19 DIAGNOSIS — I252 Old myocardial infarction: Principal | ICD-10-CM

## 2019-11-19 DIAGNOSIS — Z91041 Radiographic dye allergy status: Principal | ICD-10-CM

## 2019-11-19 DIAGNOSIS — J45909 Unspecified asthma, uncomplicated: Principal | ICD-10-CM

## 2019-11-19 DIAGNOSIS — R778 Other specified abnormalities of plasma proteins: Principal | ICD-10-CM

## 2019-11-19 DIAGNOSIS — I10 Essential (primary) hypertension: Principal | ICD-10-CM

## 2019-11-19 DIAGNOSIS — R079 Chest pain, unspecified: Principal | ICD-10-CM

## 2019-11-19 DIAGNOSIS — Z91030 Bee allergy status: Principal | ICD-10-CM

## 2019-11-19 DIAGNOSIS — Z885 Allergy status to narcotic agent status: Principal | ICD-10-CM

## 2019-11-19 DIAGNOSIS — K219 Gastro-esophageal reflux disease without esophagitis: Principal | ICD-10-CM

## 2019-11-19 DIAGNOSIS — Z9049 Acquired absence of other specified parts of digestive tract: Principal | ICD-10-CM

## 2019-11-19 DIAGNOSIS — Z23 Encounter for immunization: Principal | ICD-10-CM

## 2019-11-19 DIAGNOSIS — Z20822 Contact with and (suspected) exposure to covid-19: Principal | ICD-10-CM

## 2019-11-19 DIAGNOSIS — I2510 Atherosclerotic heart disease of native coronary artery without angina pectoris: Principal | ICD-10-CM

## 2019-11-19 DIAGNOSIS — K3184 Gastroparesis: Principal | ICD-10-CM

## 2019-11-19 DIAGNOSIS — R55 Syncope and collapse: Principal | ICD-10-CM

## 2019-11-19 DIAGNOSIS — Z8679 Personal history of other diseases of the circulatory system: Principal | ICD-10-CM

## 2019-11-19 DIAGNOSIS — E1143 Type 2 diabetes mellitus with diabetic autonomic (poly)neuropathy: Principal | ICD-10-CM

## 2019-11-23 ENCOUNTER — Encounter: Admit: 2019-11-23 | Discharge: 2019-11-24 | Payer: PRIVATE HEALTH INSURANCE

## 2019-11-23 DIAGNOSIS — E118 Type 2 diabetes mellitus with unspecified complications: Principal | ICD-10-CM

## 2019-11-23 DIAGNOSIS — Z794 Long term (current) use of insulin: Principal | ICD-10-CM

## 2019-11-23 DIAGNOSIS — I456 Pre-excitation syndrome: Principal | ICD-10-CM

## 2019-11-23 DIAGNOSIS — I2111 ST elevation (STEMI) myocardial infarction involving right coronary artery: Principal | ICD-10-CM

## 2019-11-23 DIAGNOSIS — E1165 Type 2 diabetes mellitus with hyperglycemia: Principal | ICD-10-CM

## 2019-11-23 MED ORDER — NITROGLYCERIN 0.4 MG SUBLINGUAL TABLET
ORAL_TABLET | SUBLINGUAL | 0 refills | 1 days | Status: CP | PRN
Start: 2019-11-23 — End: 2020-11-22

## 2019-11-23 MED ORDER — TRESIBA FLEXTOUCH U-100 INSULIN 100 UNIT/ML (3 ML) SUBCUTANEOUS PEN
11 refills | 0 days | Status: CP
Start: 2019-11-23 — End: 2019-12-15

## 2019-11-23 MED ORDER — FUROSEMIDE 40 MG TABLET
ORAL_TABLET | Freq: Every day | ORAL | 6 refills | 30.00000 days | Status: CP | PRN
Start: 2019-11-23 — End: 2020-02-21

## 2019-11-25 ENCOUNTER — Other Ambulatory Visit: Payer: Self-pay | Admitting: Nurse Practitioner

## 2019-11-25 NOTE — Telephone Encounter (Signed)
Requested medication (s) are due for refill today: no  Requested medication (s) are on the active medication list: yes  Last refill:  09/20/2019  Future visit scheduled: No  Notes to clinic: this refill cannot be delegated   Requested Prescriptions  Pending Prescriptions Disp Refills   cyclobenzaprine (FLEXERIL) 10 MG tablet [Pharmacy Med Name: CYCLOBENZAPRINE 10MG  TABLETS] 90 tablet 0    Sig: TAKE 1 TABLET(10 MG) BY MOUTH AT BEDTIME AS NEEDED      Not Delegated - Analgesics:  Muscle Relaxants Failed - 11/25/2019  9:15 PM      Failed - This refill cannot be delegated      Failed - Valid encounter within last 6 months    Recent Outpatient Visits           6 months ago Syncope, unspecified syncope type   [facility] [place]   6 months ago Essential hypertension   [facility] [place]   9 months ago Lower extremity numbness   [facility] [place], [place]   10 months ago Essential hypertension   [facility] [place], [place]   1 year ago Essential hypertension   [facility] [place], [place]

## 2019-11-26 NOTE — Telephone Encounter (Signed)
FYI Pt Stated cardiologist is refilling this medication as she now takes everyday instead of per needed. Pt stated she no longer is a pt of [facility] due to establishing care else where.

## 2019-12-01 ENCOUNTER — Other Ambulatory Visit: Payer: Self-pay | Admitting: Family Medicine

## 2019-12-01 NOTE — Telephone Encounter (Signed)
Please see RX request.   

## 2019-12-01 NOTE — Telephone Encounter (Signed)
Requested medication (s) are due for refill today: yes  Requested medication (s) are on the active medication list: yes,  Last refill: 04/20/19  #14  1 refill  Future visit scheduled no  Notes to clinic: Off protocol.   Requested Prescriptions  Pending Prescriptions Disp Refills   sulfamethoxazole-trimethoprim (BACTRIM DS) 800-160 MG tablet [Pharmacy Med Name: SULFAMETH/TRIMETHOPRIM 800/160MG  TB] 14 tablet 1    Sig: TAKE 1 TABLET BY MOUTH TWICE DAILY      Off-Protocol Failed - 12/01/2019  1:45 AM      Failed - Medication not assigned to a protocol, review manually.      Passed - Valid encounter within last 12 months    Recent Outpatient Visits           6 months ago Syncope, unspecified syncope type   [facility] [place] [place]   7 months ago Essential hypertension   [facility] [place], [place]   9 months ago Lower extremity numbness   [facility] [place], [place]   10 months ago Essential hypertension   [facility] [place], [place]   1 year ago Essential hypertension   [facility] [place], [place]

## 2019-12-07 ENCOUNTER — Telehealth: Payer: Self-pay

## 2019-12-07 NOTE — Telephone Encounter (Signed)
Called pt to verify who PCP is as she had [place] fill handicap placard and per provider needs apt, form in bin.If pt calls back direct call to front office.

## 2019-12-08 NOTE — Telephone Encounter (Signed)
Unable to lvm

## 2019-12-08 NOTE — Telephone Encounter (Signed)
New PCP at another location, no worries

## 2019-12-08 NOTE — Telephone Encounter (Signed)
Pt returned Sabina's call during lunch/ please advise  Pt will be at work and you can call her again at 4:30pm

## 2019-12-13 ENCOUNTER — Encounter: Admit: 2019-12-13 | Discharge: 2019-12-14 | Payer: PRIVATE HEALTH INSURANCE

## 2019-12-13 DIAGNOSIS — L732 Hidradenitis suppurativa: Principal | ICD-10-CM

## 2019-12-13 DIAGNOSIS — G4700 Insomnia, unspecified: Principal | ICD-10-CM

## 2019-12-13 DIAGNOSIS — I2111 ST elevation (STEMI) myocardial infarction involving right coronary artery: Principal | ICD-10-CM

## 2019-12-13 DIAGNOSIS — I10 Essential (primary) hypertension: Principal | ICD-10-CM

## 2019-12-13 DIAGNOSIS — E7800 Pure hypercholesterolemia, unspecified: Principal | ICD-10-CM

## 2019-12-13 DIAGNOSIS — J4520 Mild intermittent asthma, uncomplicated: Principal | ICD-10-CM

## 2019-12-13 DIAGNOSIS — G63 Polyneuropathy in diseases classified elsewhere: Principal | ICD-10-CM

## 2019-12-13 DIAGNOSIS — E1165 Type 2 diabetes mellitus with hyperglycemia: Principal | ICD-10-CM

## 2019-12-13 DIAGNOSIS — Z794 Long term (current) use of insulin: Principal | ICD-10-CM

## 2019-12-13 DIAGNOSIS — R252 Cramp and spasm: Principal | ICD-10-CM

## 2019-12-13 DIAGNOSIS — K219 Gastro-esophageal reflux disease without esophagitis: Principal | ICD-10-CM

## 2019-12-13 DIAGNOSIS — Z1159 Encounter for screening for other viral diseases: Principal | ICD-10-CM

## 2019-12-13 MED ORDER — LOSARTAN 25 MG TABLET
ORAL_TABLET | Freq: Every day | ORAL | 1 refills | 90 days | Status: CP
Start: 2019-12-13 — End: 2020-12-12

## 2019-12-13 MED ORDER — METOPROLOL SUCCINATE ER 100 MG TABLET,EXTENDED RELEASE 24 HR
ORAL_TABLET | Freq: Every morning | ORAL | 1 refills | 90 days | Status: CP
Start: 2019-12-13 — End: ?

## 2019-12-13 MED ORDER — SUVOREXANT 10 MG TABLET
ORAL_TABLET | Freq: Every evening | ORAL | 1 refills | 90 days | Status: CP
Start: 2019-12-13 — End: 2020-12-12

## 2019-12-13 MED ORDER — ATORVASTATIN 80 MG TABLET
ORAL_TABLET | Freq: Every day | ORAL | 1 refills | 90 days | Status: CP
Start: 2019-12-13 — End: 2021-01-11

## 2019-12-13 MED ORDER — SPIRONOLACTONE 100 MG TABLET
ORAL_TABLET | Freq: Every day | ORAL | 1 refills | 90 days | Status: CP
Start: 2019-12-13 — End: 2020-02-21

## 2019-12-13 MED ORDER — CYCLOBENZAPRINE 10 MG TABLET
ORAL_TABLET | Freq: Every evening | ORAL | 0 refills | 90 days | Status: CP
Start: 2019-12-13 — End: ?

## 2019-12-13 MED ORDER — AMLODIPINE 5 MG TABLET
ORAL_TABLET | Freq: Every day | ORAL | 1 refills | 90.00000 days | Status: CP
Start: 2019-12-13 — End: 2019-12-21

## 2019-12-13 MED ORDER — GABAPENTIN 300 MG CAPSULE
ORAL_CAPSULE | ORAL | 1 refills | 0.00000 days | Status: CP
Start: 2019-12-13 — End: 2020-12-12

## 2019-12-13 MED ORDER — METFORMIN ER 500 MG TABLET,EXTENDED RELEASE 24 HR
ORAL_TABLET | Freq: Two times a day (BID) | ORAL | 1 refills | 90 days | Status: CP
Start: 2019-12-13 — End: 2020-12-12

## 2019-12-13 MED ORDER — SULFAMETHOXAZOLE 800 MG-TRIMETHOPRIM 160 MG TABLET
ORAL_TABLET | Freq: Two times a day (BID) | ORAL | 0 refills | 10.00000 days | Status: CP
Start: 2019-12-13 — End: 2019-12-23

## 2019-12-15 ENCOUNTER — Encounter: Admit: 2019-12-15 | Discharge: 2019-12-15 | Payer: PRIVATE HEALTH INSURANCE

## 2019-12-15 ENCOUNTER — Encounter
Admit: 2019-12-15 | Discharge: 2019-12-15 | Payer: PRIVATE HEALTH INSURANCE | Attending: Student in an Organized Health Care Education/Training Program | Primary: Student in an Organized Health Care Education/Training Program

## 2019-12-15 DIAGNOSIS — Z23 Encounter for immunization: Principal | ICD-10-CM

## 2019-12-15 DIAGNOSIS — Z6824 Body mass index (BMI) 24.0-24.9, adult: Principal | ICD-10-CM

## 2019-12-15 DIAGNOSIS — E118 Type 2 diabetes mellitus with unspecified complications: Principal | ICD-10-CM

## 2019-12-15 DIAGNOSIS — Z794 Long term (current) use of insulin: Principal | ICD-10-CM

## 2019-12-15 DIAGNOSIS — E1169 Type 2 diabetes mellitus with other specified complication: Principal | ICD-10-CM

## 2019-12-15 DIAGNOSIS — I2111 ST elevation (STEMI) myocardial infarction involving right coronary artery: Principal | ICD-10-CM

## 2019-12-15 MED ORDER — FREESTYLE LIBRE 14 DAY SENSOR KIT
ORAL | 11 refills | 0.00000 days | Status: CP
Start: 2019-12-15 — End: 2019-12-15

## 2019-12-15 MED ORDER — FREESTYLE LIBRE 14 DAY READER
0 refills | 0.00000 days | Status: CP
Start: 2019-12-15 — End: 2019-12-15

## 2019-12-15 MED ORDER — TRESIBA FLEXTOUCH U-100 INSULIN 100 UNIT/ML (3 ML) SUBCUTANEOUS PEN
11 refills | 0 days | Status: CP
Start: 2019-12-15 — End: ?

## 2019-12-15 MED ORDER — FREESTYLE LIBRE 2 READER
2 refills | 0 days | Status: CP
Start: 2019-12-15 — End: 2020-02-16

## 2019-12-15 MED ORDER — INSULIN ASPART (U-100) 100 UNIT/ML (3 ML) SUBCUTANEOUS PEN
Freq: Three times a day (TID) | SUBCUTANEOUS | 12 refills | 84.00000 days | Status: CP
Start: 2019-12-15 — End: ?

## 2019-12-15 MED ORDER — OZEMPIC 0.25 MG OR 0.5 MG (2 MG/1.5 ML) SUBCUTANEOUS PEN INJECTOR
SUBCUTANEOUS | 3 refills | 0.00000 days | Status: CP
Start: 2019-12-15 — End: 2020-12-14

## 2019-12-15 MED ORDER — FREESTYLE LIBRE 2 SENSOR KIT
0 refills | 0 days | Status: CP
Start: 2019-12-15 — End: 2020-12-14

## 2019-12-21 ENCOUNTER — Ambulatory Visit: Admit: 2019-12-21 | Discharge: 2019-12-21 | Payer: PRIVATE HEALTH INSURANCE

## 2019-12-21 ENCOUNTER — Encounter: Admit: 2019-12-21 | Discharge: 2019-12-21 | Payer: PRIVATE HEALTH INSURANCE

## 2019-12-21 DIAGNOSIS — R252 Cramp and spasm: Principal | ICD-10-CM

## 2019-12-21 DIAGNOSIS — E7800 Pure hypercholesterolemia, unspecified: Principal | ICD-10-CM

## 2019-12-21 DIAGNOSIS — I10 Essential (primary) hypertension: Principal | ICD-10-CM

## 2019-12-21 DIAGNOSIS — Z1159 Encounter for screening for other viral diseases: Principal | ICD-10-CM

## 2019-12-21 DIAGNOSIS — I208 Other forms of angina pectoris: Principal | ICD-10-CM

## 2019-12-21 DIAGNOSIS — I2111 ST elevation (STEMI) myocardial infarction involving right coronary artery: Principal | ICD-10-CM

## 2019-12-21 MED ORDER — AMLODIPINE 10 MG TABLET
ORAL_TABLET | Freq: Every day | ORAL | 3 refills | 90.00000 days | Status: CP
Start: 2019-12-21 — End: 2020-01-20

## 2019-12-22 MED ORDER — EZETIMIBE 10 MG TABLET
ORAL_TABLET | Freq: Every day | ORAL | 3 refills | 90 days | Status: CP
Start: 2019-12-22 — End: 2020-12-21

## 2020-01-12 MED ORDER — METOCLOPRAMIDE 10 MG TABLET
ORAL_TABLET | Freq: Four times a day (QID) | ORAL | 3 refills | 30 days | Status: CP | PRN
Start: 2020-01-12 — End: 2021-01-11

## 2020-01-20 ENCOUNTER — Encounter: Admit: 2020-01-20 | Discharge: 2020-02-18 | Payer: PRIVATE HEALTH INSURANCE

## 2020-01-20 ENCOUNTER — Encounter
Admit: 2020-01-20 | Discharge: 2020-02-18 | Payer: PRIVATE HEALTH INSURANCE | Attending: Cardiovascular Disease | Primary: Cardiovascular Disease

## 2020-01-20 ENCOUNTER — Encounter
Admit: 2020-01-20 | Discharge: 2020-01-20 | Payer: PRIVATE HEALTH INSURANCE | Attending: Registered" | Primary: Registered"

## 2020-01-20 ENCOUNTER — Ambulatory Visit: Admit: 2020-01-20 | Discharge: 2020-02-18 | Payer: PRIVATE HEALTH INSURANCE

## 2020-01-20 ENCOUNTER — Ambulatory Visit
Admit: 2020-01-20 | Discharge: 2020-01-20 | Payer: PRIVATE HEALTH INSURANCE | Attending: Pharmacist Clinician (PhC)/ Clinical Pharmacy Specialist | Primary: Pharmacist Clinician (PhC)/ Clinical Pharmacy Specialist

## 2020-01-20 ENCOUNTER — Encounter
Admit: 2020-01-20 | Discharge: 2020-02-18 | Payer: PRIVATE HEALTH INSURANCE | Attending: Registered" | Primary: Registered"

## 2020-01-20 DIAGNOSIS — Z794 Long term (current) use of insulin: Principal | ICD-10-CM

## 2020-01-20 DIAGNOSIS — E118 Type 2 diabetes mellitus with unspecified complications: Principal | ICD-10-CM

## 2020-01-20 DIAGNOSIS — E1165 Type 2 diabetes mellitus with hyperglycemia: Principal | ICD-10-CM

## 2020-01-20 DIAGNOSIS — E1169 Type 2 diabetes mellitus with other specified complication: Principal | ICD-10-CM

## 2020-01-20 DIAGNOSIS — I2111 ST elevation (STEMI) myocardial infarction involving right coronary artery: Principal | ICD-10-CM

## 2020-01-27 DIAGNOSIS — F32A Depression, unspecified depression type: Principal | ICD-10-CM

## 2020-01-27 DIAGNOSIS — F419 Anxiety disorder, unspecified: Principal | ICD-10-CM

## 2020-01-27 MED ORDER — SERTRALINE 50 MG TABLET
ORAL_TABLET | Freq: Every day | ORAL | 3 refills | 30.00000 days | Status: CP
Start: 2020-01-27 — End: 2021-01-26

## 2020-02-03 ENCOUNTER — Ambulatory Visit: Admit: 2020-02-03 | Discharge: 2020-02-04 | Payer: PRIVATE HEALTH INSURANCE

## 2020-02-03 ENCOUNTER — Encounter
Admit: 2020-02-03 | Discharge: 2020-02-04 | Disposition: A | Discharge status: Home / Self Care | Payer: PRIVATE HEALTH INSURANCE

## 2020-02-03 DIAGNOSIS — L309 Dermatitis, unspecified: Principal | ICD-10-CM

## 2020-02-03 DIAGNOSIS — J45909 Unspecified asthma, uncomplicated: Principal | ICD-10-CM

## 2020-02-03 DIAGNOSIS — D649 Anemia, unspecified: Principal | ICD-10-CM

## 2020-02-03 DIAGNOSIS — I2510 Atherosclerotic heart disease of native coronary artery without angina pectoris: Principal | ICD-10-CM

## 2020-02-03 DIAGNOSIS — Z8249 Family history of ischemic heart disease and other diseases of the circulatory system: Principal | ICD-10-CM

## 2020-02-03 DIAGNOSIS — K3184 Gastroparesis: Principal | ICD-10-CM

## 2020-02-03 DIAGNOSIS — Z833 Family history of diabetes mellitus: Principal | ICD-10-CM

## 2020-02-03 DIAGNOSIS — E785 Hyperlipidemia, unspecified: Principal | ICD-10-CM

## 2020-02-03 DIAGNOSIS — I252 Old myocardial infarction: Principal | ICD-10-CM

## 2020-02-03 DIAGNOSIS — E1165 Type 2 diabetes mellitus with hyperglycemia: Principal | ICD-10-CM

## 2020-02-03 DIAGNOSIS — E669 Obesity, unspecified: Principal | ICD-10-CM

## 2020-02-03 DIAGNOSIS — Z794 Long term (current) use of insulin: Principal | ICD-10-CM

## 2020-02-03 DIAGNOSIS — Z8616 Personal history of COVID-19: Principal | ICD-10-CM

## 2020-02-03 DIAGNOSIS — R739 Hyperglycemia, unspecified: Principal | ICD-10-CM

## 2020-02-03 DIAGNOSIS — R9431 Abnormal electrocardiogram [ECG] [EKG]: Principal | ICD-10-CM

## 2020-02-03 DIAGNOSIS — E1143 Type 2 diabetes mellitus with diabetic autonomic (poly)neuropathy: Principal | ICD-10-CM

## 2020-02-03 DIAGNOSIS — I10 Essential (primary) hypertension: Principal | ICD-10-CM

## 2020-02-03 DIAGNOSIS — E1110 Type 2 diabetes mellitus with ketoacidosis without coma: Principal | ICD-10-CM

## 2020-02-03 MED ORDER — PROMETHAZINE 25 MG TABLET
ORAL_TABLET | Freq: Four times a day (QID) | ORAL | 3 refills | 15 days | Status: CP | PRN
Start: 2020-02-03 — End: ?

## 2020-02-03 MED ORDER — ONDANSETRON 4 MG DISINTEGRATING TABLET
ORAL_TABLET | Freq: Three times a day (TID) | ORAL | 3 refills | 20 days | Status: CP | PRN
Start: 2020-02-03 — End: 2021-02-02

## 2020-02-14 DIAGNOSIS — I2111 ST elevation (STEMI) myocardial infarction involving right coronary artery: Principal | ICD-10-CM

## 2020-02-14 DIAGNOSIS — E1165 Type 2 diabetes mellitus with hyperglycemia: Principal | ICD-10-CM

## 2020-02-14 DIAGNOSIS — Z794 Long term (current) use of insulin: Principal | ICD-10-CM

## 2020-02-16 ENCOUNTER — Encounter
Admit: 2020-02-16 | Discharge: 2020-02-17 | Payer: PRIVATE HEALTH INSURANCE | Attending: Student in an Organized Health Care Education/Training Program | Primary: Student in an Organized Health Care Education/Training Program

## 2020-02-16 DIAGNOSIS — Z794 Long term (current) use of insulin: Principal | ICD-10-CM

## 2020-02-16 DIAGNOSIS — E118 Type 2 diabetes mellitus with unspecified complications: Principal | ICD-10-CM

## 2020-02-16 DIAGNOSIS — E1165 Type 2 diabetes mellitus with hyperglycemia: Principal | ICD-10-CM

## 2020-02-16 DIAGNOSIS — I2111 ST elevation (STEMI) myocardial infarction involving right coronary artery: Principal | ICD-10-CM

## 2020-02-16 MED ORDER — FREESTYLE LIBRE 2 READER
2 refills | 0 days | Status: CP
Start: 2020-02-16 — End: ?

## 2020-02-16 MED ORDER — BLOOD GLUCOSE TEST STRIPS
ORAL_STRIP | 3 refills | 0.00000 days | Status: CP
Start: 2020-02-16 — End: 2020-02-16

## 2020-02-18 DIAGNOSIS — E1165 Type 2 diabetes mellitus with hyperglycemia: Principal | ICD-10-CM

## 2020-02-18 DIAGNOSIS — I2111 ST elevation (STEMI) myocardial infarction involving right coronary artery: Principal | ICD-10-CM

## 2020-02-18 DIAGNOSIS — Z794 Long term (current) use of insulin: Principal | ICD-10-CM

## 2020-02-21 ENCOUNTER — Encounter: Admit: 2020-02-21 | Discharge: 2020-03-19 | Payer: PRIVATE HEALTH INSURANCE

## 2020-02-21 ENCOUNTER — Encounter
Admit: 2020-02-21 | Discharge: 2020-03-19 | Payer: PRIVATE HEALTH INSURANCE | Attending: "Psychiatric/Mental Health | Primary: "Psychiatric/Mental Health

## 2020-02-21 ENCOUNTER — Encounter
Admit: 2020-02-21 | Discharge: 2020-03-19 | Payer: PRIVATE HEALTH INSURANCE | Attending: Cardiovascular Disease | Primary: Cardiovascular Disease

## 2020-02-21 ENCOUNTER — Ambulatory Visit: Admit: 2020-02-21 | Discharge: 2020-03-19 | Payer: PRIVATE HEALTH INSURANCE

## 2020-02-21 ENCOUNTER — Ambulatory Visit: Admit: 2020-02-21 | Discharge: 2020-02-22 | Payer: PRIVATE HEALTH INSURANCE

## 2020-02-21 ENCOUNTER — Institutional Professional Consult (permissible substitution): Admit: 2020-02-21 | Discharge: 2020-03-19 | Payer: PRIVATE HEALTH INSURANCE

## 2020-02-21 DIAGNOSIS — I2111 ST elevation (STEMI) myocardial infarction involving right coronary artery: Principal | ICD-10-CM

## 2020-02-21 DIAGNOSIS — F331 Major depressive disorder, recurrent, moderate: Principal | ICD-10-CM

## 2020-02-21 DIAGNOSIS — J4520 Mild intermittent asthma, uncomplicated: Principal | ICD-10-CM

## 2020-02-21 DIAGNOSIS — I10 Essential (primary) hypertension: Principal | ICD-10-CM

## 2020-02-21 DIAGNOSIS — E1165 Type 2 diabetes mellitus with hyperglycemia: Principal | ICD-10-CM

## 2020-02-21 DIAGNOSIS — Z794 Long term (current) use of insulin: Principal | ICD-10-CM

## 2020-02-21 DIAGNOSIS — E7800 Pure hypercholesterolemia, unspecified: Principal | ICD-10-CM

## 2020-02-21 DIAGNOSIS — G63 Polyneuropathy in diseases classified elsewhere: Principal | ICD-10-CM

## 2020-02-21 MED ORDER — GABAPENTIN 300 MG CAPSULE
ORAL_CAPSULE | 1 refills | 0 days
Start: 2020-02-21 — End: 2021-02-20

## 2020-02-21 MED ORDER — SERTRALINE 50 MG TABLET
ORAL_TABLET | Freq: Every day | ORAL | 3 refills | 90 days | Status: CP
Start: 2020-02-21 — End: 2021-02-20

## 2020-03-02 MED ORDER — CYCLOBENZAPRINE 10 MG TABLET
ORAL_TABLET | 0 refills | 0 days | Status: CP
Start: 2020-03-02 — End: ?

## 2020-03-17 ENCOUNTER — Encounter
Admit: 2020-03-17 | Discharge: 2020-03-18 | Payer: PRIVATE HEALTH INSURANCE | Attending: Pharmacist Clinician (PhC)/ Clinical Pharmacy Specialist | Primary: Pharmacist Clinician (PhC)/ Clinical Pharmacy Specialist

## 2020-03-20 ENCOUNTER — Ambulatory Visit
Admit: 2020-03-20 | Discharge: 2020-03-20 | Disposition: A | Discharge status: Home / Self Care | Payer: PRIVATE HEALTH INSURANCE

## 2020-03-20 DIAGNOSIS — R079 Chest pain, unspecified: Principal | ICD-10-CM

## 2020-03-20 DIAGNOSIS — R112 Nausea with vomiting, unspecified: Principal | ICD-10-CM

## 2020-03-22 ENCOUNTER — Emergency Department
Admit: 2020-03-22 | Discharge: 2020-03-22 | Disposition: A | Discharge status: Home / Self Care | Payer: PRIVATE HEALTH INSURANCE | Attending: Emergency Medicine

## 2020-03-22 ENCOUNTER — Encounter
Admit: 2020-03-22 | Discharge: 2020-03-22 | Disposition: A | Discharge status: Home / Self Care | Payer: PRIVATE HEALTH INSURANCE | Attending: Emergency Medicine

## 2020-03-22 DIAGNOSIS — M7989 Other specified soft tissue disorders: Principal | ICD-10-CM

## 2020-03-22 DIAGNOSIS — R079 Chest pain, unspecified: Principal | ICD-10-CM

## 2020-03-27 ENCOUNTER — Institutional Professional Consult (permissible substitution)
Admit: 2020-03-22 | Payer: PRIVATE HEALTH INSURANCE | Attending: Cardiovascular Disease | Primary: Cardiovascular Disease

## 2020-03-27 ENCOUNTER — Encounter: Admit: 2020-03-27 | Discharge: 2020-03-28 | Payer: PRIVATE HEALTH INSURANCE

## 2020-03-27 DIAGNOSIS — L732 Hidradenitis suppurativa: Principal | ICD-10-CM

## 2020-03-27 DIAGNOSIS — L910 Hypertrophic scar: Principal | ICD-10-CM

## 2020-03-27 MED ORDER — CLOBETASOL 0.05 % TOPICAL OINTMENT
Freq: Two times a day (BID) | TOPICAL | 3 refills | 0.00000 days | Status: CP
Start: 2020-03-27 — End: 2021-03-27

## 2020-03-27 MED ORDER — CLINDAMYCIN 1 % LOTION
11 refills | 0.00000 days | Status: CP
Start: 2020-03-27 — End: ?

## 2020-03-29 ENCOUNTER — Encounter: Admit: 2020-03-29 | Discharge: 2020-03-30 | Payer: PRIVATE HEALTH INSURANCE

## 2020-03-29 ENCOUNTER — Ambulatory Visit: Admit: 2020-03-24 | Payer: PRIVATE HEALTH INSURANCE

## 2020-03-29 DIAGNOSIS — R1013 Epigastric pain: Principal | ICD-10-CM

## 2020-03-29 DIAGNOSIS — Z794 Long term (current) use of insulin: Principal | ICD-10-CM

## 2020-03-29 DIAGNOSIS — R600 Localized edema: Principal | ICD-10-CM

## 2020-03-29 DIAGNOSIS — R1031 Right lower quadrant pain: Principal | ICD-10-CM

## 2020-03-29 DIAGNOSIS — K3184 Gastroparesis: Principal | ICD-10-CM

## 2020-03-29 DIAGNOSIS — R1114 Bilious vomiting: Principal | ICD-10-CM

## 2020-03-29 DIAGNOSIS — R0602 Shortness of breath: Principal | ICD-10-CM

## 2020-03-29 DIAGNOSIS — E1143 Type 2 diabetes mellitus with diabetic autonomic (poly)neuropathy: Principal | ICD-10-CM

## 2020-03-29 DIAGNOSIS — E1165 Type 2 diabetes mellitus with hyperglycemia: Principal | ICD-10-CM

## 2020-03-29 MED ORDER — PREDNISONE 50 MG TABLET
ORAL_TABLET | 0 refills | 0 days | Status: CP
Start: 2020-03-29 — End: ?

## 2020-03-31 ENCOUNTER — Encounter: Admit: 2020-03-31 | Payer: PRIVATE HEALTH INSURANCE

## 2020-03-31 ENCOUNTER — Encounter: Admit: 2020-03-31 | Discharge: 2020-04-01 | Payer: PRIVATE HEALTH INSURANCE

## 2020-03-31 DIAGNOSIS — K3184 Gastroparesis: Principal | ICD-10-CM

## 2020-03-31 DIAGNOSIS — E1143 Type 2 diabetes mellitus with diabetic autonomic (poly)neuropathy: Principal | ICD-10-CM

## 2020-03-31 MED ORDER — PREDNISONE 10 MG TABLET
ORAL_TABLET | ORAL | 0 refills | 9 days | Status: CP
Start: 2020-03-31 — End: 2020-04-09

## 2020-04-03 ENCOUNTER — Ambulatory Visit: Admit: 2020-04-03 | Payer: PRIVATE HEALTH INSURANCE

## 2020-04-03 ENCOUNTER — Encounter: Admit: 2020-04-03 | Payer: PRIVATE HEALTH INSURANCE

## 2020-04-11 MED ORDER — FUROSEMIDE 40 MG TABLET
ORAL_TABLET | 0 refills | 0 days | Status: CP
Start: 2020-04-11 — End: ?

## 2020-04-12 ENCOUNTER — Encounter: Admit: 2020-04-12 | Discharge: 2020-04-13 | Payer: PRIVATE HEALTH INSURANCE

## 2020-04-12 DIAGNOSIS — E1165 Type 2 diabetes mellitus with hyperglycemia: Principal | ICD-10-CM

## 2020-04-12 DIAGNOSIS — M79671 Pain in right foot: Principal | ICD-10-CM

## 2020-04-12 DIAGNOSIS — K3184 Gastroparesis: Principal | ICD-10-CM

## 2020-04-12 DIAGNOSIS — M79672 Pain in left foot: Principal | ICD-10-CM

## 2020-04-12 DIAGNOSIS — Z124 Encounter for screening for malignant neoplasm of cervix: Principal | ICD-10-CM

## 2020-04-12 DIAGNOSIS — E1143 Type 2 diabetes mellitus with diabetic autonomic (poly)neuropathy: Principal | ICD-10-CM

## 2020-04-12 DIAGNOSIS — Z794 Long term (current) use of insulin: Principal | ICD-10-CM

## 2020-04-14 ENCOUNTER — Encounter: Admit: 2020-04-14 | Discharge: 2020-04-15 | Payer: PRIVATE HEALTH INSURANCE

## 2020-04-14 DIAGNOSIS — R079 Chest pain, unspecified: Principal | ICD-10-CM

## 2020-04-14 DIAGNOSIS — R002 Palpitations: Principal | ICD-10-CM

## 2020-04-14 DIAGNOSIS — I2510 Atherosclerotic heart disease of native coronary artery without angina pectoris: Principal | ICD-10-CM

## 2020-04-14 DIAGNOSIS — M79672 Pain in left foot: Principal | ICD-10-CM

## 2020-04-14 DIAGNOSIS — M79671 Pain in right foot: Principal | ICD-10-CM

## 2020-04-14 DIAGNOSIS — R0600 Dyspnea, unspecified: Principal | ICD-10-CM

## 2020-04-18 MED ORDER — HYDROCODONE 5 MG-ACETAMINOPHEN 325 MG TABLET
ORAL_TABLET | ORAL | 0 refills | 0.00000 days | Status: CP
Start: 2020-04-18 — End: ?

## 2020-04-18 MED ORDER — AMOXICILLIN 875 MG-POTASSIUM CLAVULANATE 125 MG TABLET
ORAL_TABLET | 2 refills | 0.00000 days | Status: CP
Start: 2020-04-18 — End: ?

## 2020-04-24 ENCOUNTER — Ambulatory Visit: Admit: 2020-04-24 | Discharge: 2020-05-07 | Payer: PRIVATE HEALTH INSURANCE

## 2020-04-24 ENCOUNTER — Encounter: Admit: 2020-04-24 | Discharge: 2020-05-07 | Payer: PRIVATE HEALTH INSURANCE

## 2020-04-25 ENCOUNTER — Other Ambulatory Visit: Payer: Self-pay | Admitting: Family Medicine

## 2020-04-25 DIAGNOSIS — Z3041 Encounter for surveillance of contraceptive pills: Secondary | ICD-10-CM

## 2020-04-25 MED ORDER — HYDROXYZINE HCL 25 MG TABLET
ORAL_TABLET | 0 refills | 0 days | Status: CP
Start: 2020-04-25 — End: ?

## 2020-04-25 MED ORDER — NORLYDA 0.35 MG TABLET
ORAL_TABLET | 3 refills | 0 days | Status: CP
Start: 2020-04-25 — End: ?

## 2020-04-27 ENCOUNTER — Encounter
Admit: 2020-04-27 | Discharge: 2020-04-28 | Payer: PRIVATE HEALTH INSURANCE | Attending: Registered" | Primary: Registered"

## 2020-05-01 ENCOUNTER — Encounter: Admit: 2020-05-01 | Discharge: 2020-05-02 | Payer: PRIVATE HEALTH INSURANCE

## 2020-05-01 DIAGNOSIS — R079 Chest pain, unspecified: Principal | ICD-10-CM

## 2020-05-01 DIAGNOSIS — I2510 Atherosclerotic heart disease of native coronary artery without angina pectoris: Principal | ICD-10-CM

## 2020-05-15 ENCOUNTER — Ambulatory Visit: Admit: 2020-05-15 | Discharge: 2020-05-15 | Payer: PRIVATE HEALTH INSURANCE | Attending: Urology | Primary: Urology

## 2020-05-15 ENCOUNTER — Ambulatory Visit: Admit: 2020-05-15 | Discharge: 2020-05-15 | Payer: PRIVATE HEALTH INSURANCE

## 2020-05-15 DIAGNOSIS — K3184 Gastroparesis: Principal | ICD-10-CM

## 2020-05-15 DIAGNOSIS — Z113 Encounter for screening for infections with a predominantly sexual mode of transmission: Principal | ICD-10-CM

## 2020-05-15 DIAGNOSIS — Z794 Long term (current) use of insulin: Principal | ICD-10-CM

## 2020-05-15 DIAGNOSIS — L732 Hidradenitis suppurativa: Principal | ICD-10-CM

## 2020-05-15 DIAGNOSIS — F331 Major depressive disorder, recurrent, moderate: Principal | ICD-10-CM

## 2020-05-15 DIAGNOSIS — A599 Trichomoniasis, unspecified: Principal | ICD-10-CM

## 2020-05-15 DIAGNOSIS — E1165 Type 2 diabetes mellitus with hyperglycemia: Principal | ICD-10-CM

## 2020-05-15 DIAGNOSIS — E1143 Type 2 diabetes mellitus with diabetic autonomic (poly)neuropathy: Principal | ICD-10-CM

## 2020-05-15 MED ORDER — METRONIDAZOLE 500 MG TABLET
ORAL_TABLET | Freq: Two times a day (BID) | ORAL | 0 refills | 7 days | Status: CP
Start: 2020-05-15 — End: 2020-05-22

## 2020-05-19 DIAGNOSIS — G4700 Insomnia, unspecified: Principal | ICD-10-CM

## 2020-05-19 MED ORDER — BELSOMRA 10 MG TABLET
ORAL_TABLET | 1 refills | 0 days | Status: CP
Start: 2020-05-19 — End: ?

## 2020-05-19 MED ORDER — CYCLOBENZAPRINE 10 MG TABLET
ORAL_TABLET | 0 refills | 0 days | Status: CP
Start: 2020-05-19 — End: ?

## 2020-05-30 ENCOUNTER — Encounter: Admit: 2020-05-30 | Discharge: 2020-05-31 | Payer: PRIVATE HEALTH INSURANCE

## 2020-05-30 DIAGNOSIS — I2510 Atherosclerotic heart disease of native coronary artery without angina pectoris: Principal | ICD-10-CM

## 2020-05-30 DIAGNOSIS — R0600 Dyspnea, unspecified: Principal | ICD-10-CM

## 2020-05-31 DIAGNOSIS — Z8679 Personal history of other diseases of the circulatory system: Principal | ICD-10-CM

## 2020-06-12 MED ORDER — FUROSEMIDE 40 MG TABLET
ORAL_TABLET | 1 refills | 0 days | Status: CP
Start: 2020-06-12 — End: ?

## 2020-06-19 ENCOUNTER — Encounter: Admit: 2020-06-19 | Discharge: 2020-06-20 | Payer: PRIVATE HEALTH INSURANCE

## 2020-06-19 DIAGNOSIS — I10 Essential (primary) hypertension: Principal | ICD-10-CM

## 2020-06-19 DIAGNOSIS — S99922A Unspecified injury of left foot, initial encounter: Principal | ICD-10-CM

## 2020-06-19 DIAGNOSIS — Z794 Long term (current) use of insulin: Principal | ICD-10-CM

## 2020-06-19 DIAGNOSIS — F331 Major depressive disorder, recurrent, moderate: Principal | ICD-10-CM

## 2020-06-19 DIAGNOSIS — K3184 Gastroparesis: Principal | ICD-10-CM

## 2020-06-19 DIAGNOSIS — M7989 Other specified soft tissue disorders: Principal | ICD-10-CM

## 2020-06-19 DIAGNOSIS — I2111 ST elevation (STEMI) myocardial infarction involving right coronary artery: Principal | ICD-10-CM

## 2020-06-19 DIAGNOSIS — E1143 Type 2 diabetes mellitus with diabetic autonomic (poly)neuropathy: Principal | ICD-10-CM

## 2020-06-19 DIAGNOSIS — E1165 Type 2 diabetes mellitus with hyperglycemia: Principal | ICD-10-CM

## 2020-06-19 DIAGNOSIS — E7800 Pure hypercholesterolemia, unspecified: Principal | ICD-10-CM

## 2020-06-19 DIAGNOSIS — M25572 Pain in left ankle and joints of left foot: Principal | ICD-10-CM

## 2020-06-20 ENCOUNTER — Ambulatory Visit
Admit: 2020-06-20 | Discharge: 2020-06-21 | Disposition: A | Discharge status: Home / Self Care | Payer: PRIVATE HEALTH INSURANCE

## 2020-06-20 ENCOUNTER — Encounter
Admit: 2020-06-20 | Discharge: 2020-06-21 | Disposition: A | Discharge status: Home / Self Care | Payer: PRIVATE HEALTH INSURANCE | Attending: Student in an Organized Health Care Education/Training Program

## 2020-06-20 DIAGNOSIS — M25572 Pain in left ankle and joints of left foot: Principal | ICD-10-CM

## 2020-06-20 DIAGNOSIS — M25562 Pain in left knee: Principal | ICD-10-CM

## 2020-06-20 DIAGNOSIS — W19XXXA Unspecified fall, initial encounter: Principal | ICD-10-CM

## 2020-06-21 ENCOUNTER — Ambulatory Visit: Admit: 2020-06-21 | Discharge: 2020-06-22 | Payer: PRIVATE HEALTH INSURANCE

## 2020-06-21 ENCOUNTER — Encounter
Admit: 2020-06-21 | Discharge: 2020-06-22 | Payer: PRIVATE HEALTH INSURANCE | Attending: Student in an Organized Health Care Education/Training Program | Primary: Student in an Organized Health Care Education/Training Program

## 2020-06-21 DIAGNOSIS — E1165 Type 2 diabetes mellitus with hyperglycemia: Principal | ICD-10-CM

## 2020-06-21 DIAGNOSIS — Z794 Long term (current) use of insulin: Principal | ICD-10-CM

## 2020-06-21 MED ORDER — GABAPENTIN 300 MG CAPSULE
ORAL_CAPSULE | 1 refills | 0 days | Status: CP
Start: 2020-06-21 — End: 2021-06-21

## 2020-06-21 NOTE — Unmapped (Signed)
[facility]   Emergency Department Provider Note    ED Final Clinical Impression     Final diagnoses:   Fall, initial encounter (Primary)   Acute pain of left knee   Acute left ankle pain         ED Course, Assessment and Plan     Pamela Gutierrez is a 30 y.o. female with PMH of T2DM, HTN, HLD, STEMI, and depression, being seen in the ED for evaluation of L leg pain.    Initial vital signs are unremarkable. On exam, patient is nontoxic appearing and in NAD. Abrasion to interior L knee. Calf tenderness. Swelling to malleolus. Achilles intact. Heart rate normal and rhythm regular. Lungs clear to auscultation bilaterally. Abdomen is soft and non-tender.     Plan for XR Knee.       ED Course:     XR knee neg. XR performed at PCP reviewed and neg for acute fracture. BSUS for PVL was neg for acute DVT. Plan for CAM walking boot and ortho follow-up. Patient will obtain PVL as an outpatient in the AM. Discussed return precautions and follow-up instructions.       ____________________________________________    Time seen: June 20, 2020 9:14 PM     History     CHIEF COMPLAINT:   Chief Complaint   Patient presents with   ??? Foot Injury       HPI:   Pamela Gutierrez is a 30 y.o. female with PMH of T2DM, HTN, HLD, STEMI, and depression, being seen in the ED for evaluation of L leg pain. The patient reports 1 week of worsening swelling of L knee with associated pain after sustaining a mechanical fall. No head trauma or LOC. She reports that the pain is around her ankles and knees and is worsened with ambulation and weight bearing.     Patient was evaluated at PCP where she had an XR ankle and XR foot that was unremarkable. Advised to have PVL down as an outpatient with PCP  ____________________________________________    PAST MEDICAL HISTORY/PAST SURGICAL HISTORY:   Past Medical History:   Diagnosis Date   ??? Allergic    ??? Anemia    ??? Asthma    ??? Eczema    ??? Gastroparesis    ??? GERD (gastroesophageal reflux disease)    ??? Hyperlipidemia    ??? Hypertension    ??? Murmur, cardiac    ??? Obesity    ??? 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 Type 2 diabetes mellitus (CMS-HCC)     BS- 90-130       MEDICATIONS:   No current facility-administered medications for this encounter.    Current Outpatient Medications:   ???  acetaminophen (TYLENOL 8 HOUR) 650 MG CR tablet, Take 650 mg by mouth daily., Disp: , Rfl:   ???  acyclovir (ZOVIRAX) 400 MG tablet, Take 400 mg by mouth once as needed. , Disp: , Rfl:   ???  albuterol HFA 90 mcg/actuation inhaler, Inhale 2 puffs every four (4) hours as needed. , Disp: , Rfl:   ???  amLODIPine (NORVASC) 10 MG tablet, Take 1 tablet (10 mg total) by mouth daily., Disp: 90 tablet, Rfl: 3  ???  amoxicillin-clavulanate (AUGMENTIN) 875-125 mg per tablet, Take twice daily, Disp: 60 tablet, Rfl: 2  ???  aspirin 81 MG chewable tablet, Chew 1 tablet (81 mg total) daily., Disp: 30 tablet, Rfl: 11  ???  atorvastatin (LIPITOR)  80 MG tablet, Take 1 tablet (80 mg total) by mouth daily., Disp: 90 tablet, Rfl: 1  ???  BELSOMRA 10 mg tablet, TAKE 1 TABLET(10 MG) BY MOUTH EVERY NIGHT, Disp: 90 tablet, Rfl: 1  ???  blood sugar diagnostic (GLUCOSE BLOOD) Strp, Test once daily and as directed.one touch verio flex testing strips, Disp: 100 strip, Rfl: 3  ???  blood-glucose meter kit, Currently has One Touch Ultra test strips. Please issue per her formulary., Disp: 1 each, Rfl: 1  ???  cetirizine 10 mg cap, Take 10 mg by mouth daily as needed. , Disp: , Rfl:   ???  clindamycin (CLEOCIN T) 1 % lotion, Once daily to body areas that are typically affected, Disp: 60 mL, Rfl: 11  ???  clobetasoL (TEMOVATE) 0.05 % ointment, Apply topically Two (2) times a day. As needed for flares up to a week at a time, Disp: 60 g, Rfl: 3  ???  cyclobenzaprine (FLEXERIL) 10 MG tablet, TAKE 1 TABLET(10 MG) BY MOUTH EVERY NIGHT, Disp: 90 tablet, Rfl: 0  ???  evolocumab (REPATHA SYRINGE) 140 mg/mL Syrg, Inject the contents of one pen (140 mg) under the skin every fourteen (14) days., Disp: 6 mL, Rfl: 3  ???  ezetimibe (ZETIA) 10 mg tablet, Take 1 tablet (10 mg total) by mouth daily. To help lower cholesterol, Disp: 90 tablet, Rfl: 3  ???  ferrous sulfate 325 (65 FE) MG tablet, Take by mouth. Once daily PRN, Disp: , Rfl:   ???  flash glucose scanning reader (FLASH GLUCOSE SCANNING READER), by Other route Take as directed. Dispense Libre 2 scanning reader, use as directed, Disp: 1 each, Rfl: 2  ???  flash glucose sensor (FLASH GLUCOSE SENSOR) kit, by Other route every fourteen (14) days. Dispense Libre 2 sensors, either 1 mo supply or 3 mo supply, as per patient preference; change q 14 days, Disp: 6 each, Rfl: 0  ???  furosemide (LASIX) 40 MG tablet, TAKE 1 TABLET BY MOUTH DAILY AS NEEDED FOR> 2 POUND WEIGHT GAIN IN A 24 HOUR PERIOD, Disp: 90 tablet, Rfl: 1  ???  gabapentin (NEURONTIN) 300 MG capsule, Take 300 mg (1 capsule) in the morning, 300 mg at noon, and 600 mg (2 capsules) in the evening., Disp: 360 capsule, Rfl: 1  ???  HYDROcodone-acetaminophen (NORCO) 5-325 mg per tablet, Take 1 tablet every 6 hours as needed for pain, Disp: 15 tablet, Rfl: 0  ???  hydrOXYzine (ATARAX) 25 MG tablet, TAKE 1 TABLET(25 MG) BY MOUTH EVERY 8 HOURS AS NEEDED, Disp: 90 tablet, Rfl: 0  ???  insulin ASPART (NOVOLOG FLEXPEN) 100 unit/mL (3 mL) injection pen, Inject 0.06 mL (6 Units total) under the skin Three (3) times a day before meals. Take 5 units three times per day with meals (2 units with snacks/small meals), plus 2 extra for every 50 points over 150. Up to 50 units per day., Disp: 15 mL, Rfl: 12  ???  insulin degludec (TRESIBA FLEXTOUCH U-100) 100 unit/mL (3 mL) InPn, 36 units per day., Disp: 21 mL, Rfl: 11  ???  insulin needles, disposable, (BD ULTRA-FINE NANO PEN NEEDLES) 32 x 5/32  Ndle, Inject 1 each under the skin Five (5) times a day. Use 5x/day with Lantus, Novolog, and Victoza pens., Disp: 200 each, Rfl: 12  ???  insulin syringe-needle U-100 (BD INSULIN SYRINGE ULT-FINE II) 0.5 mL 31 gauge x 5/16 Syrg, ok to sub any brand or size insulin syringe preferred by insurance/patient, use 5x/day, dx E11.65, Disp: 200  each, Rfl: 5  ???  insulin syringe-needle U-100 0.3 mL 31 x 3/8 Syrg, For use with Humulin R AC TID., Disp: 300 Syringe, Rfl: 3  ???  losartan (COZAAR) 25 MG tablet, Take 1 tablet (25 mg total) by mouth daily., Disp: 90 tablet, Rfl: 1  ???  metFORMIN (GLUCOPHAGE-XR) 500 MG 24 hr tablet, Take 1 tablet (500 mg total) by mouth two (2) times a day., Disp: 180 tablet, Rfl: 1  ???  metoclopramide (REGLAN) 10 MG tablet, Take 1 tablet (10 mg total) by mouth four (4) times a day as needed. Take 1 tablet 4 times daily as neeed, Disp: 120 tablet, Rfl: 3  ???  metoprolol succinate (TOPROL-XL) 100 MG 24 hr tablet, Take 1 tablet (100 mg total) by mouth every morning., Disp: 90 tablet, Rfl: 1  ???  montelukast (SINGULAIR) 10 mg tablet, Take 10 mg by mouth daily as needed. , Disp: , Rfl:   ???  nitroglycerin (NITROSTAT) 0.4 MG SL tablet, Place 1 tablet (0.4 mg total) under the tongue every five (5) minutes as needed for chest pain. Maximum of 3 doses in 15 minutes., Disp: 25 tablet, Rfl: 0  ???  NORLYDA 0.35 mg tablet, TAKE 1 TABLET(0.35 MG) BY MOUTH DAILY, Disp: 84 tablet, Rfl: 3  ???  omeprazole (PRILOSEC) 20 MG capsule, Take 20 mg by mouth daily., Disp: , Rfl:   ???  ondansetron (ZOFRAN-ODT) 4 MG disintegrating tablet, Take 1 tablet (4 mg total) by mouth every eight (8) hours as needed for nausea., Disp: 60 tablet, Rfl: 3  ???  prasugreL (EFFIENT) 10 mg tablet, Take 1 tablet (10 mg total) by mouth daily., Disp: 30 tablet, Rfl: 11  ???  promethazine (PHENERGAN) 25 MG tablet, Take 1 tablet (25 mg total) by mouth every six (6) hours as needed for nausea., Disp: 60 tablet, Rfl: 3  ???  semaglutide (OZEMPIC) 0.25 mg or 0.5 mg(2 mg/1.5 mL) PnIj injection, Start 0.25 mg/week and increase after one month to 0.5 mg/week as directed by MD, Disp: 1.5 mL, Rfl: 3  ???  sertraline (ZOLOFT) 50 MG tablet, Take 1.5 tablets (75 mg total) by mouth daily., Disp: 135 tablet, Rfl: 3  ???  simethicone 125 mg cap, Take 120 mg by mouth two (2) times a day as needed., Disp: , Rfl:   ???  spironolactone (ALDACTONE) 100 MG tablet, Take 1 tablet (100 mg total) by mouth daily., Disp: 90 tablet, Rfl: 1  ???  triamcinolone (KENALOG) 0.1 % cream, Apply topically once as needed. , Disp: , Rfl:     ALLERGIES:   Lanolin, Morphine (pf), Tomato, Venom-honey bee, Wool, Ibuprofen, Nsaids (non-steroidal anti-inflammatory drug), Iodinated contrast media, and Ioxaglate sodium    SOCIAL HISTORY:   Social History     Tobacco Use   ??? Smoking status: Current Some Day Smoker     Types: Cigars   ??? Smokeless tobacco: Never Used   ??? Tobacco comment: Black milds   Substance Use Topics   ??? Alcohol use: Yes     Comment: occasional       FAMILY HISTORY:  Family History   Problem Relation Age of Onset   ??? Diabetes Mother    ??? Diabetes Father    ??? Heart disease Maternal Grandmother    ??? Diabetes Maternal Grandmother    ??? Heart disease Paternal Grandmother    ??? Melanoma Neg Hx    ??? Basal cell carcinoma Neg Hx    ??? Squamous cell carcinoma Neg Hx  ____________________________________________    Review of Systems  Constitutional: Negative for fever.  Cardiovascular: Negative for chest pain.  Respiratory: Negative for shortness of breath.  A 10 point review of systems was performed and is negative other than positive elements noted in HPI     Physical Exam     VITAL SIGNS:    BP 130/92  - Pulse 82  - Temp 37 ??C (98.6 ??F)  - Resp 18  - SpO2 100%     Constitutional: Alert and oriented. Well appearing and in no distress.  Head: Normocephalic and atraumatic  Eyes: EOMI, PERRL, conjunctiva normal  ENT: External ears normal, bilateral nares clear, MMM  Cardiovascular: Normal rate, regular rhythm. Normal and symmetric distal pulses are present in all extremities.  Respiratory: Normal respiratory effort. Breath sounds are normal.  Gastrointestinal: Soft, non-distended and nontender. There is no CVA tenderness.  Musculoskeletal: Calf tenderness. Swelling to malleolus. Achilles intact.       Right lower leg: No tenderness or edema.       Left lower leg: No tenderness or edema.  Neurologic: Normal speech and language. No gross focal neurologic deficits are appreciated.  Skin: Abrasion to interior L knee.  Psychiatric: Mood and affect are normal. Speech and behavior are normal.    Radiology     XR Knee 1 or 2 Views Left   Final Result   No acute osseous abnormality.            Pertinent labs & imaging results that were available during my care of the patient were reviewed by me and considered in my medical decision making. Labs and radiology studies included in my note may not constitute all ordered/reviewied labs during this encounter (see chart for details).    Portions of this record have been created using Scientist, clinical (histocompatibility and immunogenetics). Dictation errors have been sought, but may not have been identified and corrected.    Documentation assistance was provided by Debby Freiberg Casimiro Needle) Durenda Age, on June 20, 2020 at 21:14 for Bea Laura, MD.    June 27, 2020 10:08 AM. Documentation assistance provided by the above mentioned scribe. I was present during the time the encounter was recorded. The information recorded by the scribe was done at my direction and has been reviewed and validated by me.   Bea Laura, MD          Bea Laura, MD  06/27/20 [phone]

## 2020-06-22 DIAGNOSIS — E785 Hyperlipidemia, unspecified: Principal | ICD-10-CM

## 2020-06-22 DIAGNOSIS — I2111 ST elevation (STEMI) myocardial infarction involving right coronary artery: Principal | ICD-10-CM

## 2020-06-22 MED ORDER — REPATHA SYRINGE 140 MG/ML SUBCUTANEOUS SYRINGE
SUBCUTANEOUS | 3 refills | 0 days | Status: CP
Start: 2020-06-22 — End: ?
  Filled 2020-07-14: qty 6, 84d supply, fill #0

## 2020-06-23 NOTE — Unmapped (Signed)
[facility] Specialty Medication Onboarding    Specialty Medication: Repatha  Prior Authorization: Approved   Financial Assistance: No - copay  <$25  Final Copay/Day Supply: $0 / 84    Insurance Restrictions: None     Notes to Pharmacist: n/a    The triage team has completed the benefits investigation and has determined that the patient is able to fill this medication at [facility]. Please contact the patient to complete the onboarding or follow up with the prescribing physician as needed.

## 2020-06-23 NOTE — Unmapped (Unsigned)
[facility]   Patient Onboarding/Medication Counseling    Pamela Gutierrez is a 30 y.o. female with hyperlipidemia who I am counseling today on initiation of therapy.  I am speaking to the patient.    Was a Nurse, learning disability used for this call? No    Verified patient's date of birth / HIPAA.    Specialty medication(s) to be sent: General Specialty: Repatha      Non-specialty medications/supplies to be sent: Higher education careers adviser      Medications not needed at this time: n/a         Repatha (evolocumab)    The patient declined counseling on medication administration, missed dose instructions, goals of therapy, side effects and monitoring parameters, warnings and precautions, drug/food interactions and storage, handling precautions, and disposal because they were counseled in clinic. The information in the declined sections below are for informational purposes only and was not discussed with patient.       Medication & Administration     Dosage: Inject the contents of 1 syringe (140mg ) under the skin every 2 weeks.    Administration: Administer under the skin of the abdomen, thigh or upper arm. Rotate sites with each injection.  ??? Injection instructions - Syringe:  o Remove 1 Repatha syringe and let stand at room temperature for at least 30 minutes.  o Check the syringe for the following:  - Expiration date  - Absence of any cracks or damage  - The medicine is clear and colorless and does not contain any particles  o Choose your injection site and clean with an alcohol wipe. Allow to air dry completely.  o Pull the gray needle cap straight off and dispose of it  o Pinch the skin (or stretch) with your thumb and fingers creating an area 2 inches wide  o Maintaining the pinch (or stretch) insert the needle at a 45 to 90 degree angle  o When you are ready to inject, slowly and steadily press the plunger rod down all the way until the syringe is empty  o When done, release your thumb and gently lift the syringe straight off the skin  o Dispose of the syringe in a sharps container. Do not try to recap the syringe with the gray needle cap.  o If there is blood at the injection site, press a cotton ball or gauze to the site. Do not rub the injection site.    Adherence/Missed dose instructions: Administer a missed dose within 7 days and resume your normal schedule.  If it has been more than 7 days and you inject every 2 weeks, skip the missed dose and resume your normal schedule.    Goals of Therapy     Lower cholesterol, prevention of cardiovascular events in patients with established cardiovascular disease    Side Effects & Monitoring Parameters   ??? Flu-like symptoms  ??? Signs of a common cold  ??? Back pain  ??? Injection site irritation  ??? Nose or throat irritation    The following side effects should be reported to the provider:  ??? Signs of an allergic reaction  ??? Signs of high blood sugar (confusion, drowsiness, increase thirst/hunger/urination, fast breathing, flushing)      Contraindications, Warnings, & Precautions     ??? Latex (the packaging of Repatha may contain natural rubber)    Drug/Food Interactions     ??? Medication list reviewed in Epic. The patient was instructed to inform the care team before taking any new  medications or supplements. No drug interactions identified.     Storage, Handling Precautions, & Disposal   ??? Repatha should be stored in the refrigerator. If necessary, Repatha may be kept at room temperature for no more than 30 days.  ??? Place used devices in a sharps container for disposal.      Current Medications (including OTC/herbals), Comorbidities and Allergies     Current Outpatient Medications   Medication Sig Dispense Refill   ??? acetaminophen (TYLENOL 8 HOUR) 650 MG CR tablet Take 650 mg by mouth daily.     ??? acyclovir (ZOVIRAX) 400 MG tablet Take 400 mg by mouth once as needed.      ??? albuterol HFA 90 mcg/actuation inhaler Inhale 2 puffs every four (4) hours as needed.      ??? amLODIPine (NORVASC) 10 MG tablet Take 1 tablet (10 mg total) by mouth daily. 90 tablet 3   ??? amoxicillin-clavulanate (AUGMENTIN) 875-125 mg per tablet Take twice daily 60 tablet 2   ??? aspirin 81 MG chewable tablet Chew 1 tablet (81 mg total) daily. 30 tablet 11   ??? atorvastatin (LIPITOR) 80 MG tablet Take 1 tablet (80 mg total) by mouth daily. 90 tablet 1   ??? BELSOMRA 10 mg tablet TAKE 1 TABLET(10 MG) BY MOUTH EVERY NIGHT 90 tablet 1   ??? blood sugar diagnostic (GLUCOSE BLOOD) Strp Test once daily and as directed.one touch verio flex testing strips 100 strip 3   ??? blood-glucose meter kit Currently has One Touch Ultra test strips. Please issue per her formulary. 1 each 1   ??? cetirizine 10 mg cap Take 10 mg by mouth daily as needed.      ??? clindamycin (CLEOCIN T) 1 % lotion Once daily to body areas that are typically affected 60 mL 11   ??? clobetasoL (TEMOVATE) 0.05 % ointment Apply topically Two (2) times a day. As needed for flares up to a week at a time 60 g 3   ??? cyclobenzaprine (FLEXERIL) 10 MG tablet TAKE 1 TABLET(10 MG) BY MOUTH EVERY NIGHT 90 tablet 0   ??? evolocumab (REPATHA SYRINGE) 140 mg/mL Syrg Inject the contents of one pen (140 mg) under the skin every fourteen (14) days. 6 mL 3   ??? ezetimibe (ZETIA) 10 mg tablet Take 1 tablet (10 mg total) by mouth daily. To help lower cholesterol 90 tablet 3   ??? ferrous sulfate 325 (65 FE) MG tablet Take by mouth. Once daily PRN     ??? flash glucose scanning reader (FLASH GLUCOSE SCANNING READER) by Other route Take as directed. Dispense Libre 2 scanning reader, use as directed 1 each 2   ??? flash glucose sensor (FLASH GLUCOSE SENSOR) kit by Other route every fourteen (14) days. Dispense Libre 2 sensors, either 1 mo supply or 3 mo supply, as per patient preference; change q 14 days 6 each 0   ??? furosemide (LASIX) 40 MG tablet TAKE 1 TABLET BY MOUTH DAILY AS NEEDED FOR> 2 POUND WEIGHT GAIN IN A 24 HOUR PERIOD 90 tablet 1   ??? gabapentin (NEURONTIN) 300 MG capsule Take 300 mg (1 capsule) in the morning, 300 mg at noon, and 600 mg (2 capsules) in the evening. 360 capsule 1   ??? HYDROcodone-acetaminophen (NORCO) 5-325 mg per tablet Take 1 tablet every 6 hours as needed for pain 15 tablet 0   ??? hydrOXYzine (ATARAX) 25 MG tablet TAKE 1 TABLET(25 MG) BY MOUTH EVERY 8 HOURS AS NEEDED 90 tablet 0   ???  insulin ASPART (NOVOLOG FLEXPEN) 100 unit/mL (3 mL) injection pen Inject 0.06 mL (6 Units total) under the skin Three (3) times a day before meals. Take 5 units three times per day with meals (2 units with snacks/small meals), plus 2 extra for every 50 points over 150. Up to 50 units per day. 15 mL 12   ??? insulin degludec (TRESIBA FLEXTOUCH U-100) 100 unit/mL (3 mL) InPn 36 units per day. 21 mL 11   ??? insulin needles, disposable, (BD ULTRA-FINE NANO PEN NEEDLES) 32 x 5/32  Ndle Inject 1 each under the skin Five (5) times a day. Use 5x/day with Lantus, Novolog, and Victoza pens. 200 each 12   ??? insulin syringe-needle U-100 (BD INSULIN SYRINGE ULT-FINE II) 0.5 mL 31 gauge x 5/16 Syrg ok to sub any brand or size insulin syringe preferred by insurance/patient, use 5x/day, dx E11.65 200 each 5   ??? insulin syringe-needle U-100 0.3 mL 31 x 3/8 Syrg For use with Humulin R AC TID. 300 Syringe 3   ??? losartan (COZAAR) 25 MG tablet Take 1 tablet (25 mg total) by mouth daily. 90 tablet 1   ??? metFORMIN (GLUCOPHAGE-XR) 500 MG 24 hr tablet Take 1 tablet (500 mg total) by mouth two (2) times a day. 180 tablet 1   ??? metoclopramide (REGLAN) 10 MG tablet Take 1 tablet (10 mg total) by mouth four (4) times a day as needed. Take 1 tablet 4 times daily as neeed 120 tablet 3   ??? metoprolol succinate (TOPROL-XL) 100 MG 24 hr tablet Take 1 tablet (100 mg total) by mouth every morning. 90 tablet 1   ??? montelukast (SINGULAIR) 10 mg tablet Take 10 mg by mouth daily as needed.      ??? nitroglycerin (NITROSTAT) 0.4 MG SL tablet Place 1 tablet (0.4 mg total) under the tongue every five (5) minutes as needed for chest pain. Maximum of 3 doses in 15 minutes. 25 tablet 0   ??? NORLYDA 0.35 mg tablet TAKE 1 TABLET(0.35 MG) BY MOUTH DAILY 84 tablet 3   ??? omeprazole (PRILOSEC) 20 MG capsule Take 20 mg by mouth daily.     ??? ondansetron (ZOFRAN-ODT) 4 MG disintegrating tablet Take 1 tablet (4 mg total) by mouth every eight (8) hours as needed for nausea. 60 tablet 3   ??? prasugreL (EFFIENT) 10 mg tablet Take 1 tablet (10 mg total) by mouth daily. 30 tablet 11   ??? promethazine (PHENERGAN) 25 MG tablet Take 1 tablet (25 mg total) by mouth every six (6) hours as needed for nausea. 60 tablet 3   ??? semaglutide (OZEMPIC) 0.25 mg or 0.5 mg(2 mg/1.5 mL) PnIj injection Start 0.25 mg/week and increase after one month to 0.5 mg/week as directed by MD 1.5 mL 3   ??? sertraline (ZOLOFT) 50 MG tablet Take 1.5 tablets (75 mg total) by mouth daily. 135 tablet 3   ??? simethicone 125 mg cap Take 120 mg by mouth two (2) times a day as needed.     ??? spironolactone (ALDACTONE) 100 MG tablet Take 1 tablet (100 mg total) by mouth daily. 90 tablet 1   ??? triamcinolone (KENALOG) 0.1 % cream Apply topically once as needed.        No current facility-administered medications for this visit.       Allergies   Allergen Reactions   ??? Lanolin Hives   ??? Morphine (Pf) Swelling   ??? Tomato Other (See Comments) and Swelling   ??? Venom-Honey Bee Swelling   ???  Wool Hives and Swelling   ??? Ibuprofen Other (See Comments)   ??? Nsaids (Non-Steroidal Anti-Inflammatory Drug) Other (See Comments)   ??? Iodinated Contrast Media Nausea And Vomiting and Rash   ??? Ioxaglate Sodium Nausea And Vomiting       Patient Active Problem List   Diagnosis   ??? Type 2 diabetes mellitus with hyperglycemia, with long-term current use of insulin (CMS-HCC)   ??? GERD (gastroesophageal reflux disease)   ??? Mild intermittent asthma without complication   ??? Seasonal allergies   ??? Anemia   ??? Hypertension   ??? Gastroparesis due to DM (CMS-HCC)   ??? Syncope   ??? WPW (Wolff-Parkinson-White syndrome)   ??? ST elevation myocardial infarction involving right coronary artery (CMS-HCC)   ??? Chest pain   ??? Pure hypercholesterolemia   ??? Left foot drop   ??? Hidradenitis suppurativa   ??? Insomnia   ??? Polyneuropathy associated with underlying disease (CMS-HCC)   ??? Moderate episode of recurrent major depressive disorder (CMS-HCC)   ??? Cervical high risk HPV (human papillomavirus) test positive       Reviewed and up to date in Epic.    Appropriateness of Therapy     Acute infections noted within Epic:  Rule Out COVID-19.  Patient reported infection: None    Is medication and dose appropriate based on diagnosis and infection status? Yes    Prescription has been clinically reviewed: Yes      Baseline Quality of Life Assessment      How many days over the past month did your hyperlipidemia  keep you from your normal activities? For example, brushing your teeth or getting up in the morning. Patient declined to answer    Financial Information     Medication Assistance provided: Prior Authorization    Anticipated copay of $0 (84 days) reviewed with patient. Verified delivery address.    Delivery Information     Scheduled delivery date: 07/14/20    Expected start date: 07/14/20    Medication will be delivered via Same Day Courier to the prescription address in [facility].  This shipment will not require a signature.      Explained the services we provide at [facility] and that each month we would call to set up refills.  Stressed importance of returning phone calls so that we could ensure they receive their medications in time each month.  Informed patient that we should be setting up refills 7-10 days prior to when they will run out of medication.  A pharmacist will reach out to perform a clinical assessment periodically.  Informed patient that a welcome packet, containing information about our pharmacy and other support services, a Notice of Privacy Practices, and a drug information handout will be sent.      The patient or caregiver noted above participated in the development of this care plan and knows that they can request review of or adjustments to the care plan at any time.      Patient or caregiver verbalized understanding of the above information as well as how to contact the pharmacy at [phone] option 4 with any questions/concerns.  The pharmacy is open Monday through Friday 8:30am-4:30pm.  A pharmacist is available 24/7 via pager to answer any clinical questions they may have.    Patient Specific Needs     - Does the patient have any physical, cognitive, or cultural barriers? No    - Does the patient have adequate living arrangements? (i.e. the  ability to store and take their medication appropriately) Yes    - Did you identify any home environmental safety or security hazards? No    - Patient prefers to have medications discussed with  Patient     - Is the patient or caregiver able to read and understand education materials at a high school level or above? No    - Patient's primary language is  English     - Is the patient high risk? No    - Does the patient require physician intervention or other additional services (i.e. dietary/nutrition, smoking cessation, social work)? No      Camillo Flaming  [facility] Specialty Pharmacist pharmacy at [phone] option 4 with any questions/concerns.  The pharmacy is open Monday through Friday 8:30am-4:30pm.  A pharmacist is available 24/7 via pager to answer any clinical questions they may have.    Patient Specific Needs     - Does the patient have any physical, cognitive, or cultural barriers? {Blank single:19197::No,Yes - ***}    - Does the patient have adequate living arrangements? (i.e. the ability to store and take their medication appropriately) {Blank single:19197::Yes,No - ***}    - Did you identify any home environmental safety or security hazards? {Blank single:19197::No,Yes - ***}    - Patient prefers to have medications discussed with  {Blank single:19197::Patient,Family Member,Caregiver,Other}     - Is the patient or caregiver able to read and understand education materials at a high school level or above? {Blank single:19197::No,Yes}    - Patient's primary language is  {Blank single:19197::English,Spanish,***}     - Is the patient high risk? {sschighriskpts:78327}    - Does the patient require physician intervention or other additional services (i.e. dietary/nutrition, smoking cessation, social work)? {Blank single:19197::No,Yes - ***}      Camillo Flaming  [facility] Specialty Pharmacist

## 2020-06-27 NOTE — Unmapped (Signed)
CARDIAC REHABILITATION  INDIVIDUALIZED TREATMENT PLAN  ITP Phase: Initial   Date: 06/27/2020    Patient Name: Pamela Gutierrez                                           AACVPR Risk: High Risk  Angina Scale: No symptoms  Diagnosis: STEMI, Stent      EXERCISE PRESCRIPTION    EXERCISE GOALS:  EXERCISE GOALS 01/20/2020 01/31/2020 02/14/2020 03/13/2020 04/03/2020 06/27/2020   Patient goal Improve energy level Improve energy level Improve energy level Improve energy level Improve energy level Improve energy level   Other Goals Patient will demonstrate an ability to take their own pulse;Patient will demonstrate an understanding of their exercise prescription;Patient will demonstrate knowledge of safe exercise parameters;Patient will increase the duration and/or intensity of exercise;Increase MET level as appropriate Patient will demonstrate an ability to take their own pulse;Patient will demonstrate an understanding of their exercise prescription;Patient will demonstrate knowledge of safe exercise parameters;Patient will increase the duration and/or intensity of exercise;Increase MET level as appropriate Patient will demonstrate an ability to take their own pulse;Patient will demonstrate an understanding of their exercise prescription;Patient will demonstrate knowledge of safe exercise parameters;Patient will increase the duration and/or intensity of exercise;Increase MET level as appropriate Patient will demonstrate an ability to take their own pulse;Patient will demonstrate an understanding of their exercise prescription;Patient will demonstrate knowledge of safe exercise parameters;Patient will increase the duration and/or intensity of exercise;Increase MET level as appropriate Patient will demonstrate an ability to take their own pulse;Patient will demonstrate an understanding of their exercise prescription;Patient will demonstrate knowledge of safe exercise parameters;Patient will increase the duration and/or intensity of exercise;Increase MET level as appropriate Patient will demonstrate an ability to take their own pulse;Patient will demonstrate an understanding of their exercise prescription;Patient will demonstrate knowledge of safe exercise parameters;Patient will increase the duration and/or intensity of exercise;Increase MET level as appropriate   Progress towards goal In Progress;Verbalizes understanding In Progress;Verbalizes understanding In Progress;Verbalizes understanding In Progress;Verbalizes understanding No No       EXERCISE INTERVENTIONS:  EXERCISE INTERVENTIONS 01/20/2020 01/31/2020 02/14/2020 03/13/2020 04/03/2020 06/27/2020   Interventions Educate patient on Dyspnea scale;Educate patient on exercise prescription, THRR, RPE scale, and MET level;Educate patient on normal/abnormal response to exercise;Educate patient on pulse-taking techniques;Orient patient to equipment safety guidelines;Participate in warm-up and cool-down;Provide home exercise plan and community resources for maintenance Educate patient on Dyspnea scale;Educate patient on exercise prescription, THRR, RPE scale, and MET level;Educate patient on normal/abnormal response to exercise;Educate patient on pulse-taking techniques;Orient patient to equipment safety guidelines;Participate in warm-up and cool-down;Provide home exercise plan and community resources for maintenance Educate patient on Dyspnea scale;Educate patient on exercise prescription, THRR, RPE scale, and MET level;Educate patient on normal/abnormal response to exercise;Educate patient on pulse-taking techniques;Orient patient to equipment safety guidelines;Participate in warm-up and cool-down;Provide home exercise plan and community resources for maintenance Educate patient on Dyspnea scale;Educate patient on exercise prescription, THRR, RPE scale, and MET level;Educate patient on normal/abnormal response to exercise;Educate patient on pulse-taking techniques;Orient patient to equipment safety guidelines;Participate in warm-up and cool-down;Provide home exercise plan and community resources for maintenance Educate patient on Dyspnea scale;Educate patient on exercise prescription, THRR, RPE scale, and MET level;Educate patient on normal/abnormal response to exercise;Educate patient on pulse-taking techniques;Orient patient to equipment safety guidelines;Participate in warm-up and cool-down;Provide home exercise plan and community resources for maintenance Educate patient on Dyspnea scale;Educate  patient on exercise prescription, THRR, RPE scale, and MET level;Educate patient on normal/abnormal response to exercise;Educate patient on pulse-taking techniques;Orient patient to equipment safety guidelines;Participate in warm-up and cool-down;Provide home exercise plan and community resources for maintenance       EXERCISE ASSSESSMENT:  EXERCISE PRESCRIPTION 01/20/2020 01/31/2020 02/14/2020 02/16/2020 03/13/2020 04/03/2020 06/27/2020   ITP Phase Initial Initial Initial Initial 30 day Early Exit Initial   Special Precautions / Protocols Indicated Continuous telemetry;Diabetic precautions;Fall precautions;Orthopedic precautions Continuous telemetry;Diabetic precautions;Fall precautions;Orthopedic precautions Continuous telemetry;Diabetic precautions;Fall precautions;Orthopedic precautions - Continuous telemetry;Diabetic precautions;Fall precautions;Orthopedic precautions Continuous telemetry;Diabetic precautions;Fall precautions;Orthopedic precautions Continuous telemetry;Diabetic precautions;Fall precautions;Orthopedic precautions   Supplemental O2 Required? No No No - No No No       AEROBIC CONDITIONING / EXERCISE PLAN:  AEROBIC CONDITIONING / EXERCISE PLAN 01/20/2020 01/31/2020 02/14/2020 03/13/2020 04/03/2020 06/27/2020   Frequency per day 1x per day 1x per day 1x per day 1x per day 1x per day 1x per day   Frequency per week 3xs weekly 3xs weekly 3xs weekly 3xs weekly 3xs weekly 3xs weekly   Aerobic minutes per session 31 - 45 31 - 45 31 - 45 31 - 45 31 - 45 31 - 45   RPE/BORG 3 - 6 - - - - -   THRR Low 130 130 130 130 130 130   THRR High 150 150 150 150 150 150   Determined Predicted HR Calculated on HR Reserve formula;Calculated on  age predicted HR Calculated on HR Reserve formula;Calculated on  age predicted HR Calculated on HR Reserve formula;Calculated on  age predicted HR Calculated on HR Reserve formula;Calculated on  age predicted HR Calculated on HR Reserve formula;Calculated on  age predicted HR Calculated on HR Reserve formula;Calculated on  age predicted HR   Rehab Progression Decrease rest interval duration;Gradually increase frequency and duration Decrease rest interval duration;Gradually increase frequency and duration Decrease rest interval duration;Gradually increase frequency and duration Decrease rest interval duration;Gradually increase frequency and duration Decrease rest interval duration;Gradually increase frequency and duration Decrease rest interval duration;Gradually increase frequency and duration       AEROBIC CONDITIONING / MODES OF EXERCISE:  AEROBIC CONDITIONING / MODES OF EXERCISE: 01/20/2020 01/31/2020 02/14/2020 03/13/2020 04/03/2020 06/27/2020   Stationary Bike Recumbent Recumbent Recumbent Recumbent Recumbent Recumbent   Level 1 1 1 1 1  -   Elliptical No No No No No No   NuStep Yes Yes Yes Yes Yes Yes   Level 3 3 3 5 5  -   Rower No No No No No No   Track Yes Yes Yes Yes Yes Yes   Treadmill Yes Yes Yes Yes Yes Yes   MPH 2 2 2 2 2  -   UBE Yes Yes Yes Yes Yes Yes   Level 2 2 2 2 2  -       STRENGTH TRAINING / EXERCISE PLAN:  RESISTANCE TRAINING 01/20/2020 01/31/2020 02/14/2020 03/13/2020 04/03/2020 06/27/2020   Resistance Training Yes Yes Yes Yes Yes Yes   Modes theraband;body weight theraband;body weight theraband;body weight theraband;body weight theraband;body weight -   Frequency 2-3 x week 2-3 x week 2-3 x week 2-3 x week 2-3 x week -   Intensity / RPE 3 - 6 3 - 6 3 - 6 3 - 6 3 - 6 -   Repetitions 10 - 12 10 - 12 10 - 12 10 - 12 10 - 12 -   Sets 2 2 2 2 2  -   Limitations or precautions orthopedics orthopedics orthopedics orthopedics orthopedics -  Progression Increase resistance amount when patient successfully completes the prescribed number of  sets and reps with RPE below 3 (1-10 scale) Increase resistance amount when patient successfully completes the prescribed number of  sets and reps with RPE below 3 (1-10 scale) Increase resistance amount when patient successfully completes the prescribed number of  sets and reps with RPE below 3 (1-10 scale) Increase resistance amount when patient successfully completes the prescribed number of  sets and reps with RPE below 3 (1-10 scale) Increase resistance amount when patient successfully completes the prescribed number of  sets and reps with RPE below 3 (1-10 scale) -       OTHER TRAINING / EXERCISE PLAN:  OTHER TRAINING 01/20/2020 01/31/2020 02/14/2020 03/13/2020 04/03/2020 06/27/2020   Balance Training Yes - Daily dynamic balance exercises in warm up and cool down phase of aerobic exercise session. Yes - Daily dynamic balance exercises in warm up and cool down phase of aerobic exercise session. Yes - Daily dynamic balance exercises in warm up and cool down phase of aerobic exercise session. Yes - Daily dynamic balance exercises in warm up and cool down phase of aerobic exercise session. Yes - Daily dynamic balance exercises in warm up and cool down phase of aerobic exercise session. Yes - Daily dynamic balance exercises in warm up and cool down phase of aerobic exercise session.   Flexibility training type static stretching post cardio or resistance training for all major muscle groups x 16 - 30sec duration. static stretching post cardio or resistance training for all major muscle groups x 16 - 30sec duration. static stretching post cardio or resistance training for all major muscle groups x 16 - 30sec duration. static stretching post cardio or resistance training for all major muscle groups x 16 - 30sec duration. static stretching post cardio or resistance training for all major muscle groups x 16 - 30sec duration. static stretching post cardio or resistance training for all major muscle groups x 16 - 30sec duration.   Home Exercise Yes Yes Yes Yes Yes Yes   Other Exercise Comments - - Today was Akeisha's initial cardiac rehab exercise session. Patient was educated on what signs/symptoms should be reported to the cardiac rehab staff. Patient was also educated on Diplomatic Services operational officer. Cici managed 33 minutes on the Nu-Step at level 3. Exercise was tolerated with no signs/symptoms or complaints. However, pre exercise blood glucose was 374. Due to the elevated reading, cardiologist on staff was consulted and patient deemed safe to exercise. Burnis Kingfisher stated she had not taken any of her diabetes medicine this morning as she was nervous to have a low blood glucose reading. Discussed the importance of taking her medication as prescribed. She verbalized understanding. Post exercise blood glucose was 317. Will continue to monitor blood glucose levels pre and post exercise for the next few sessions. Patient demonstrated verbal understanding of all recommendations and education during today???s session. Patient was monitored via telemetry and we will notify the MD if changes or symptoms are noted. Patient understands that masks are required to be worn during each visit with cardiac rehab. Burnis Kingfisher is progressing in strength and endurance. She has increased her workload on the NuStep. She states that she feels she has gained endurance. She is monitored by telemetry and no EKG changes have been noted. She demonstrates understanding of THRR, RPE scale and s/sx to report. Burnis Kingfisher will exit the program early due to other health issues. She has not met her goals. She will call us when she is  able and we will request a new referral. Burnis Kingfisher will exit the program early due to other health issues. She has not met her goals. She will call us when she is able and we will request a new referral.       EXERCISE EDUCATION CLASSES:  EXERCISE EDUCATION 03/28/2020 03/28/2020   Exercise Education Exercise for Life Strength Training   Attend date 03/24/2020 03/31/2020   Other comments [facility] Chart [facility] Chart         NUTRITION ASSESSMENT    NUTRITION GOALS:  NUTRITION GOALS 01/20/2020 01/31/2020 02/14/2020 03/13/2020 04/03/2020 06/27/2020   Other Goals Patient will lose 1-2 lbs. per week if BMI is greater than 25;Patient will improve nutrition assessment score;Patient will improve serum cholesterol status;Patient will decrease waist circumference;Patient will increase daily water intake Patient will lose 1-2 lbs. per week if BMI is greater than 25;Patient will improve nutrition assessment score;Patient will improve serum cholesterol status;Patient will decrease waist circumference;Patient will increase daily water intake Patient will lose 1-2 lbs. per week if BMI is greater than 25;Patient will improve nutrition assessment score;Patient will improve serum cholesterol status;Patient will decrease waist circumference;Patient will increase daily water intake Patient will lose 1-2 lbs. per week if BMI is greater than 25;Patient will improve nutrition assessment score;Patient will improve serum cholesterol status;Patient will decrease waist circumference;Patient will increase daily water intake Patient will lose 1-2 lbs. per week if BMI is greater than 25;Patient will improve nutrition assessment score;Patient will improve serum cholesterol status;Patient will decrease waist circumference;Patient will increase daily water intake Patient will lose 1-2 lbs. per week if BMI is greater than 25;Patient will improve nutrition assessment score;Patient will improve serum cholesterol status;Patient will decrease waist circumference;Patient will increase daily water intake   Progress towards goal Verbalizes understanding Verbalizes understanding Verbalizes understanding Verbalizes understanding Verbalizes understanding Verbalizes understanding       NUTRITION INTERVENTIONS:  NUTRITION INTERVENTIONS 01/20/2020 01/31/2020 02/14/2020 03/13/2020 04/03/2020 06/27/2020   Interventions Educate the patient on healthy weight loss;Attend nutrition education classes;Monitor weight at each exercise session;Attend individual consultation with the RD;Encourage patient to bring water bottle to each exercise session;Educate the patient on the importance of proper hydration;Educate the patient on correlation between heart disease and cholesterol status Educate the patient on healthy weight loss;Attend nutrition education classes;Monitor weight at each exercise session;Attend individual consultation with the RD;Encourage patient to bring water bottle to each exercise session;Educate the patient on the importance of proper hydration;Educate the patient on correlation between heart disease and cholesterol status Educate the patient on healthy weight loss;Attend nutrition education classes;Monitor weight at each exercise session;Attend individual consultation with the RD;Encourage patient to bring water bottle to each exercise session;Educate the patient on the importance of proper hydration;Educate the patient on correlation between heart disease and cholesterol status Educate the patient on healthy weight loss;Attend nutrition education classes;Monitor weight at each exercise session;Attend individual consultation with the RD;Encourage patient to bring water bottle to each exercise session;Educate the patient on the importance of proper hydration;Educate the patient on correlation between heart disease and cholesterol status Educate the patient on healthy weight loss;Attend nutrition education classes;Monitor weight at each exercise session;Attend individual consultation with the RD;Encourage patient to bring water bottle to each exercise session;Educate the patient on the importance of proper hydration;Educate the patient on correlation between heart disease and cholesterol status Educate the patient on healthy weight loss;Attend nutrition education classes;Monitor weight at each exercise session;Attend individual consultation with the RD;Encourage patient to bring water bottle to each exercise session;Educate the patient  on the importance of proper hydration;Educate the patient on correlation between heart disease and cholesterol status       NUTRITION ASSESSMENT:  NUTRITION ASSESSMENT 04/14/2020 05/15/2020 05/15/2020 05/30/2020 06/19/2020 06/21/2020 06/27/2020   Weight 167 lb 14.4 oz 166 lb 165 lb 9.6 oz 166 lb 3.2 oz 164 lb 1.6 oz 164 lb 0.4 oz -   Height 5' 5.5 5' 5 - 5' 5.5 5' 5.5 5' 5.512 -   BMI (Calculated) 27.51 27.62 - 27.23 26.88 26.87 -   Method/Tool - - - - - - PYP   Score - - - - - - 65   Other Nutrition Comments - - - - - - Marcedes's weight is stable. She has met her educational goal by meeting with the RD individually. Please see attached note.       Lipids:  Total Cholesterol:   Cholesterol   Date Value Ref Range Status   02/21/2020 110 <=200 mg/dL Final     Cholesterol, Total   Date Value Ref Range Status   12/17/2013 169 100 - 199 mg/dL Final     Trig:   Triglycerides   Date Value Ref Range Status   11/12/2019 65 0 - 150 mg/dL Final   13/08/6576 469 (H) 1 - 149 mg/dL Final     Hdl:   HDL   Date Value Ref Range Status   02/21/2020 24 (L) 40 - 60 mg/dL Final   62/95/2841 27 (L) 40 - 59 mg/dL Final     LDL:   LDL Calculated   Date Value Ref Range Status   11/12/2019 87 40 - 99 mg/dL Final     Comment:     NHLBI Recommended Ranges, LDL Cholesterol, for Adults (20+yrs) (ATPIII), mg/dL  Optimal              <324  Near Optimal        100-129  Borderline High     130-159  High                160-189  Very High            >=190  NHLBI Recommended Ranges, LDL Cholesterol, for Children (2-19 yrs), mg/dL  Desirable            <401  Borderline High     110-129  High                 >=130       LDL Direct   Date Value Ref Range Status   06/21/2020 137.6 mg/dL Final     Comment:     NHLBI Recommended Ranges, LDL Cholesterol, for Adults (20+yrs) (ATPIII), mg/dL  Optimal              <027  Near Optimal        100-129  Borderline High     130-159  High                160-189  Very High            >=190  NHLBI Recommended Ranges, LDL Cholesterol, for Children (2-19 yrs), mg/dL  Desirable            <253  Borderline High     110-129  High                 >=130       11/18/2012 128 mg/dL Final     Comment:     :  ADULTS (20 years or older)  Optimal         <100  Near Optimal    100-129  Borderline High 130-159  High            160-189  Very High       >=190  CHILDREN (2-19 years)  Desirable       <110  Borderline High 110-129  High            >/= 130       NUTRITION EDUCATION CLASSES:  NUTRITION EDUCATION 02/16/2020 03/14/2020   Nutrition Education One to One Consult Reading Food Labels   Attend date 02/15/2020 03/17/2020   Other comments - [facility] Chart         PSYCHOSOCIAL ASSESSMENT    PSYCHOSOCIAL GOALS:  PSYCHOSOCIAL GOALS 01/20/2020 01/31/2020 02/14/2020 03/13/2020 04/03/2020 06/27/2020   Other Goals Patient will improve psychosocial assessment scores;Patient will maximize coping skills;Patient will demonstrate relaxation techniques;Patient will identify support systems;Patient will report decreased stress levels Patient will improve psychosocial assessment scores;Patient will maximize coping skills;Patient will demonstrate relaxation techniques;Patient will identify support systems;Patient will report decreased stress levels Patient will improve psychosocial assessment scores;Patient will maximize coping skills;Patient will demonstrate relaxation techniques;Patient will identify support systems;Patient will report decreased stress levels Patient will improve psychosocial assessment scores;Patient will maximize coping skills;Patient will demonstrate relaxation techniques;Patient will identify support systems;Patient will report decreased stress levels Patient will improve psychosocial assessment scores;Patient will maximize coping skills;Patient will demonstrate relaxation techniques;Patient will identify support systems;Patient will report decreased stress levels Patient will improve psychosocial assessment scores;Patient will maximize coping skills;Patient will demonstrate relaxation techniques;Patient will identify support systems;Patient will report decreased stress levels   Progress towards goal In progress;Verbalizes understanding In progress;Verbalizes understanding In progress;Verbalizes understanding In progress;Verbalizes understanding In progress;Verbalizes understanding In progress;Verbalizes understanding       PSYCHOSOCIAL INTERVENTIONS:  PSYCHOSOCIAL INTERVENTIONS 01/20/2020 01/31/2020 02/14/2020 03/13/2020 04/03/2020 06/27/2020   Interventions Refer to Vocational Rehab;Attend stress management class;Attend individual consultation with mental health professional;Instruct in relaxation techniques and coping skills Refer to Vocational Rehab;Attend stress management class;Attend individual consultation with mental health professional;Instruct in relaxation techniques and coping skills Refer to Vocational Rehab;Attend stress management class;Attend individual consultation with mental health professional;Instruct in relaxation techniques and coping skills Refer to Vocational Rehab;Attend stress management class;Attend individual consultation with mental health professional;Instruct in relaxation techniques and coping skills Refer to Vocational Rehab;Attend stress management class;Attend individual consultation with mental health professional;Instruct in relaxation techniques and coping skills Refer to Vocational Rehab;Attend stress management class;Attend individual consultation with mental health professional;Instruct in relaxation techniques and coping skills PSYCHOSOCIAL ASSESSMENT:  PSYCHOSOCIAL ASSESSEMENT 01/20/2020 01/31/2020 02/14/2020 03/10/2020 03/13/2020 04/03/2020 06/27/2020   Psychosocial Method/Tool ESSI ESSI ESSI - ESSI ESSI ESSI   Psychosocial Score 22 22 22  - 22 22 22    SF 12 Physical score 14 14 14  - 14 14 14    Depression Method/Tool PHQ9 PHQ9 PHQ9 - PHQ9 PHQ9 PHQ9   Depression Score 15 15 15  - 15 15 15    Anxiety Method/Tool GAD GAD GAD - GAD GAD GAD   Anxiety Score 9 9 9  - 9 9 9    Current State On meds On meds On meds - On meds On meds On meds   Other Psychosocial Comments Eddie North reports good social support from friends and family. Her PHQ9 and GAD scores are high. Will refer her to psychosocial counselor. Eddie North reports good social support from friends and family. Her PHQ9 and GAD scores are high. Will refer her to psychosocial  counselor. Eddie North reports good social support from friends and family. Her PHQ9 and GAD scores are high. Will refer her to psychosocial counselor. Per Covid 19 protocol for safety, [facility] Individual Stress Management services were provided. Stress: Identifies walking anywhere - causes fatigue and sometimes shortness of breath as primary stress. PHQ 9=  15 suggesting a moderately severe level of depression, including little interest or pleasure and low mood with occasional thoughts that it would be OK not to wake up, but has not thought of hurting herself, has difficulty falling and staying asleep, low energy, poor appetite, and feeling fidgety;  started Zoloft 75 mg daily about one month ago; is tolerating without side effects; discussed onset of action and likelihood that she will begin to see additional benefits over the next few months; has first outpatient, online psychotherapy appointment scheduled for March 8 GAD= 9 suggesting a moderate level of anxiety including worry, difficulty relaxing, feeling restless, easily annoyed, and fearful that something else bad will happen. ESSI= 22 indicating a high level of social support including family, friends, and a few co-workers   INTERVENTIONS: Reviewed results of PHQ. 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 Stated Goal to improve walking so she can switch from walker to cane. Has home exercise plan from PT and goes to park to exercise with her brothers. Excellent attendance in cardiac rehab, so very motivated to improve functional status. Sleep difficulties started following skin graft under R armpit (DFA and DSA). Uses an app for sleep that a friend told her about. Recognizes racing heart when feeling distressed, and uses mindful breathing. Suggested she view Stress Management classes 1 and 4 to to help with these tools. Appreciates the support of family and friends, boss who welcomes her back to work when her health permits. 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 Follow up: Available while in cardiac rehab. Time: 70 minutes Per Covid 19 protocol for safety, CR Individual Stress Management services were provided. Stress: Identifies walking anywhere - causes fatigue and sometimes shortness of breath as primary stress. PHQ 9=  15 suggesting a moderately severe level of depression, including little interest or pleasure and low mood with occasional thoughts that it would be OK not to wake up, but has not thought of hurting herself, has difficulty falling and staying asleep, low energy, poor appetite, and feeling fidgety;  started Zoloft 75 mg daily about one month ago; is tolerating without side effects; discussed onset of action and likelihood that she will begin to see additional benefits over the next few months; has first outpatient, online psychotherapy appointment scheduled for March 8 GAD= 9 suggesting a moderate level of anxiety including worry, difficulty relaxing, feeling restless, easily annoyed, and fearful that something else bad will happen. ESSI= 22 indicating a high level of social support including family, friends, and a few co-workers   INTERVENTIONS: Reviewed results of PHQ. 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 Stated Goal to improve walking so she can switch from walker to cane. Has home exercise plan from PT and goes to park to exercise with her brothers. Excellent attendance in cardiac rehab, so very motivated to improve functional status. Sleep difficulties started following skin graft under R armpit (DFA and DSA). Uses an app for sleep that a friend told her about. Recognizes racing heart when feeling distressed, and uses mindful breathing. Suggested she view Stress Management classes 1 and 4 to to help with these tools. Appreciates the support of family and friends, boss who welcomes her back to work when her health permits. 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 Follow up: Available while in cardiac rehab. Time: 70 minutes Per Covid 19 protocol for safety, CR Individual Stress Management services were provided. Stress: Identifies walking anywhere - causes fatigue and sometimes shortness of breath as primary stress. PHQ 9=  15 suggesting a moderately severe level of depression, including little interest or pleasure and low mood with occasional thoughts that it would be OK not to wake up, but has not thought of hurting herself, has difficulty falling and staying asleep, low energy, poor appetite, and feeling fidgety;  started Zoloft 75 mg daily about one month ago; is tolerating without side effects; discussed onset of action and likelihood that she will begin to see additional benefits over the next few months; has first outpatient, online psychotherapy appointment scheduled for March 8 GAD= 9 suggesting a moderate level of anxiety including worry, difficulty relaxing, feeling restless, easily annoyed, and fearful that something else bad will happen. ESSI= 22 indicating a high level of social support including family, friends, and a few co-workers   INTERVENTIONS: Reviewed results of PHQ. 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 Stated Goal to improve walking so she can switch from walker to cane. Has home exercise plan from PT and goes to park to exercise with her brothers. Excellent attendance in cardiac rehab, so very motivated to improve functional status. Sleep difficulties started following skin graft under R armpit (DFA and DSA). Uses an app for sleep that a friend told her about. Recognizes racing heart when feeling distressed, and uses mindful breathing. Suggested she view Stress Management classes 1 and 4 to to help with these tools. Appreciates the support of family and friends, boss who welcomes her back to work when her health permits. 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 Follow up: Available while in cardiac rehab. Time: 70 minutes Per Covid 19 protocol for safety, CR Individual Stress Management services were provided. Stress: Identifies walking anywhere - causes fatigue and sometimes shortness of breath as primary stress. PHQ 9=  15 suggesting a moderately severe level of depression, including little interest or pleasure and low mood with occasional thoughts that it would be OK not to wake up, but has not thought of hurting herself, has difficulty falling and staying asleep, low energy, poor appetite, and feeling fidgety;  started Zoloft 75 mg daily about one month ago; is tolerating without side effects; discussed onset of action and likelihood  that she will begin to see additional benefits over the next few months; has first outpatient, online psychotherapy appointment scheduled for March 8 GAD= 9 suggesting a moderate level of anxiety including worry, difficulty relaxing, feeling restless, easily annoyed, and fearful that something else bad will happen. ESSI= 22 indicating a high level of social support including family, friends, and a few co-workers   INTERVENTIONS: Reviewed results of PHQ. 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 Stated Goal to improve walking so she can switch from walker to cane. Has home exercise plan from PT and goes to park to exercise with her brothers. Excellent attendance in cardiac rehab, so very motivated to improve functional status. Sleep difficulties started following skin graft under R armpit (DFA and DSA). Uses an app for sleep that a friend told her about. Recognizes racing heart when feeling distressed, and uses mindful breathing. Suggested she view Stress Management classes 1 and 4 to to help with these tools. Appreciates the support of family and friends, boss who welcomes her back to work when her health permits. 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 Follow up: Available while in cardiac rehab. Time: 70 minutes       PSYCHOSOCIAL EDUCATION CLASSES:  PSYCHOSOCIAL EDUCATION 01/20/2020 02/15/2020 03/07/2020 03/10/2020   Psychosocial Education Stress management Relaxation Techniques Other One to One Consult   Attend date 01/20/2020 02/18/2020 03/10/2020 03/10/2020   Other comments MyChart Videos [facility] Chart [facility] Individual stress management meeting         OTHER CORE COMPONENTS    BLOOD PRESSURE:  BLOOD PRESSURE 01/20/2020 01/31/2020 02/14/2020 03/13/2020 04/03/2020 06/27/2020   Other Goals Patient will maintain appropriate blood pressure readings less than 130/80 Patient will maintain appropriate blood pressure readings less than 130/80 Patient will maintain appropriate blood pressure readings less than 130/80 Patient will maintain appropriate blood pressure readings less than 130/80 Patient will maintain appropriate blood pressure readings less than 130/80 Patient will maintain appropriate blood pressure readings less than 130/80   Interventions Monitor blood pressure at each session;Educate on importance of taking prescribed blood pressure medication correctly;Educate on importance of regular exercise program;Educate on importance of maintaining appropriate diet and weight Monitor blood pressure at each session;Educate on importance of taking prescribed blood pressure medication correctly;Educate on importance of regular exercise program;Educate on importance of maintaining appropriate diet and weight Monitor blood pressure at each session;Educate on importance of taking prescribed blood pressure medication correctly;Educate on importance of regular exercise program;Educate on importance of maintaining appropriate diet and weight Monitor blood pressure at each session;Educate on importance of taking prescribed blood pressure medication correctly;Educate on importance of regular exercise program;Educate on importance of maintaining appropriate diet and weight Monitor blood pressure at each session;Educate on importance of taking prescribed blood pressure medication correctly;Educate on importance of regular exercise program;Educate on importance of maintaining appropriate diet and weight Monitor blood pressure at each session;Educate on importance of taking prescribed blood pressure medication correctly;Educate on importance of regular exercise program;Educate on importance of maintaining appropriate diet and weight   Progress towards goal In progress;Verbalizes understanding In progress;Verbalizes understanding In progress;Verbalizes understanding In progress;Verbalizes understanding In progress;Verbalizes understanding In progress;Verbalizes understanding  Most recent BP:      DIABETES:  DIABETES 01/20/2020 01/31/2020 02/14/2020 03/13/2020 04/03/2020 06/27/2020   Other Goals Patient will maintain an acceptable blood sugar range Patient will maintain an acceptable blood sugar range Patient will maintain an acceptable blood sugar range Patient will maintain an acceptable blood sugar range Patient will maintain an acceptable blood sugar range Patient will maintain an acceptable blood sugar range   Interventions Educate patient on compliance with home blood sugar monitoring as prescribed by physician;Educate patient on signs and symptoms of hypo- and hyperglycemia;Instruct patient to monitor blood sugar before and after exercise;Referral to Diabetes Education Educate patient on compliance with home blood sugar monitoring as prescribed by physician;Educate patient on signs and symptoms of hypo- and hyperglycemia;Instruct patient to monitor blood sugar before and after exercise;Referral to Diabetes Education Educate patient on compliance with home blood sugar monitoring as prescribed by physician;Educate patient on signs and symptoms of hypo- and hyperglycemia;Instruct patient to monitor blood sugar before and after exercise;Referral to Diabetes Education Educate patient on compliance with home blood sugar monitoring as prescribed by physician;Educate patient on signs and symptoms of hypo- and hyperglycemia;Instruct patient to monitor blood sugar before and after exercise;Referral to Diabetes Education Educate patient on compliance with home blood sugar monitoring as prescribed by physician;Educate patient on signs and symptoms of hypo- and hyperglycemia;Instruct patient to monitor blood sugar before and after exercise;Referral to Diabetes Education Educate patient on compliance with home blood sugar monitoring as prescribed by physician;Educate patient on signs and symptoms of hypo- and hyperglycemia;Instruct patient to monitor blood sugar before and after exercise;Referral to Diabetes Education   HbA1c 11.5 11.5 11.5 11.5 11.5 11.5   Blood sugar 283 283 283 283 283 283   Check BS at home? Yes Yes Yes Yes Yes Yes   Progress towards goal In progress;Verbalizes understanding In progress;Verbalizes understanding In progress;Verbalizes understanding In progress;Verbalizes understanding In progress;No In progress;No       HEART FAILURE:  HEART FAILURE 01/20/2020 01/31/2020 02/14/2020 03/13/2020 04/03/2020 06/27/2020   Patient goal improve energy level improve energy level improve energy level improve energy level improve energy level improve energy level   Other Goals Patient will document daily weight;Improve activity level and ability for self-care Patient will document daily weight;Improve activity level and ability for self-care Patient will document daily weight;Improve activity level and ability for self-care Patient will document daily weight;Improve activity level and ability for self-care Patient will document daily weight;Improve activity level and ability for self-care Patient will document daily weight;Improve activity level and ability for self-care   Interventions Educate the patient on importance of daily weight documentation;Educate the patient the role of diuretics and rationale for compliance and monitor for side effects;Encourage adequate fluid intake within fluid restrictions as ordered by MD;Educate on low sodium foods and beverages and the importance of compliance with all ordered diet and fluid restrictions;Monitor and educate for signs/symptoms of volume overload such as edema, increased weight, and/or shortness of breath Educate the patient on importance of daily weight documentation;Educate the patient the role of diuretics and rationale for compliance and monitor for side effects;Encourage adequate fluid intake within fluid restrictions as ordered by MD;Educate on low sodium foods and beverages and the importance of compliance with all ordered diet and fluid restrictions;Monitor and educate for signs/symptoms of volume overload such as edema, increased weight, and/or shortness of breath Educate the patient on importance of daily weight documentation;Educate the patient the role of diuretics and rationale for compliance and monitor for side effects;Encourage  adequate fluid intake within fluid restrictions as ordered by MD;Educate on low sodium foods and beverages and the importance of compliance with all ordered diet and fluid restrictions;Monitor and educate for signs/symptoms of volume overload such as edema, increased weight, and/or shortness of breath Educate the patient on importance of daily weight documentation;Educate the patient the role of diuretics and rationale for compliance and monitor for side effects;Encourage adequate fluid intake within fluid restrictions as ordered by MD;Educate on low sodium foods and beverages and the importance of compliance with all ordered diet and fluid restrictions;Monitor and educate for signs/symptoms of volume overload such as edema, increased weight, and/or shortness of breath Educate the patient on importance of daily weight documentation;Educate the patient the role of diuretics and rationale for compliance and monitor for side effects;Encourage adequate fluid intake within fluid restrictions as ordered by MD;Educate on low sodium foods and beverages and the importance of compliance with all ordered diet and fluid restrictions;Monitor and educate for signs/symptoms of volume overload such as edema, increased weight, and/or shortness of breath Educate the patient on importance of daily weight documentation;Educate the patient the role of diuretics and rationale for compliance and monitor for side effects;Encourage adequate fluid intake within fluid restrictions as ordered by MD;Educate on low sodium foods and beverages and the importance of compliance with all ordered diet and fluid restrictions;Monitor and educate for signs/symptoms of volume overload such as edema, increased weight, and/or shortness of breath   Type of Heart Failure Chronic diastolic (congestive) heart failure Chronic diastolic (congestive) heart failure Chronic diastolic (congestive) heart failure Chronic diastolic (congestive) heart failure Chronic diastolic (congestive) heart failure Chronic diastolic (congestive) heart failure   EF % 55 55 55 55 55 55   NYHA Class 1 1 1 1 1 1    Progress towards goal In progress;Verbalizes understanding In progress;Verbalizes understanding In progress;Verbalizes understanding In progress;Verbalizes understanding No No       MEDICATION COMPREHENSION:  MEDICATION COMPREHENSION 01/20/2020 01/31/2020 02/14/2020 03/13/2020 04/03/2020 06/27/2020   Other Goals Patient will demonstrate knowledge of importance of taking meds as evidenced by verbalizing understanding of class, dosage, frequency and side effects. Patient will demonstrate knowledge of importance of taking meds as evidenced by verbalizing understanding of class, dosage, frequency and side effects. Patient will demonstrate knowledge of importance of taking meds as evidenced by verbalizing understanding of class, dosage, frequency and side effects. Patient will demonstrate knowledge of importance of taking meds as evidenced by verbalizing understanding of class, dosage, frequency and side effects. Patient will demonstrate knowledge of importance of taking meds as evidenced by verbalizing understanding of class, dosage, frequency and side effects. Patient will demonstrate knowledge of importance of taking meds as evidenced by verbalizing understanding of class, dosage, frequency and side effects.   Interventions Educate the patient on prescription and importance of time of day taken;Educate the patient on the class of medication, action and side effects of medication;Identify reasons for poor compliance with meds and discuss alternative options;Review dosage and frequency of medication with the patient Educate the patient on prescription and importance of time of day taken;Educate the patient on the class of medication, action and side effects of medication;Identify reasons for poor compliance with meds and discuss alternative options;Review dosage and frequency of medication with the patient Educate the patient on prescription and importance of time of day taken;Educate the patient on the class of medication, action and side effects of medication;Identify reasons for poor compliance with meds and discuss alternative options;Review dosage and frequency of medication with  the patient Educate the patient on prescription and importance of time of day taken;Educate the patient on the class of medication, action and side effects of medication;Identify reasons for poor compliance with meds and discuss alternative options;Review dosage and frequency of medication with the patient Educate the patient on prescription and importance of time of day taken;Educate the patient on the class of medication, action and side effects of medication;Identify reasons for poor compliance with meds and discuss alternative options;Review dosage and frequency of medication with the patient Educate the patient on prescription and importance of time of day taken;Educate the patient on the class of medication, action and side effects of medication;Identify reasons for poor compliance with meds and discuss alternative options;Review dosage and frequency of medication with the patient   Medications Understood Yes Yes Yes Yes Yes Yes   Meds taken % of time 95 95 95 95 95 95   Progress towards goal In progress;Verbalizes understanding In progress;Verbalizes understanding In progress;Verbalizes understanding In progress;Verbalizes understanding In progress In progress       TOBACCO CESSATION:  TOBACCO CESSATION 01/20/2020 01/31/2020 02/14/2020 03/13/2020 04/03/2020 06/27/2020   Patient goal never smoker never smoker never smoker never smoker never smoker never smoker       Social History     Tobacco Use   Smoking Status Current Some Day Smoker   ??? Types: Cigars   Smokeless Tobacco Never Used   Tobacco Comment    Black milds       OTHER CORE COMPONENTS COMMENTS:  OTHER COMMENTS 01/20/2020 01/31/2020 02/14/2020 03/13/2020 04/03/2020 06/27/2020   Other comments Burnis Kingfisher came in today for her evaluation for CARDIAC REHAB. She was oriented to CARDIAC REHAB and the [facility]. We discussed signs and symptoms that he should report while in CARDIAC REHAB. We also reviewed the precautions we are taking to prevent the spread of Covid here at the [facility]. Madilynne tested positive for covid 11 days ago and she is asymptomatic, and has quarantined for 10 days. She uses a walker because she has foot drop bilaterally. She has a high A1C and she checks her blood sugar 3-4 times per day. She states that she takes her meds as prescribed 95% of the time. Her goal for CARDIAC REHAB is to get her energy back. She carries her NTG with her, and has only needed to use it once. Burnis Kingfisher will join the 8:15 class on 01/28/20. Burnis Kingfisher came in today for her evaluation for CARDIAC REHAB. She was oriented to CARDIAC REHAB and the [facility]. We discussed signs and symptoms that he should report while in CARDIAC REHAB. We also reviewed the precautions we are taking to prevent the spread of Covid here at the [facility]. Joella tested positive for covid 11 days ago and she is asymptomatic, and has quarantined for 10 days. She uses a walker because she has foot drop bilaterally. She has a high A1C and she checks her blood sugar 3-4 times per day. She states that she takes her meds as prescribed 95% of the time. Her goal for CARDIAC REHAB is to get her energy back. She carries her NTG with her, and has only needed to use it once. Burnis Kingfisher will join the 8:15 class on 01/28/20. Burnis Kingfisher came in today for her evaluation for CARDIAC REHAB. She was oriented to CARDIAC REHAB and the [facility]. We discussed signs and symptoms that he should report while in CARDIAC REHAB. We also reviewed the precautions we are taking to prevent the spread  of Covid here at the [facility]. Chanese tested positive for covid 11 days ago and she is asymptomatic, and has quarantined for 10 days. She uses a walker because she has foot drop bilaterally. She has a high A1C and she checks her blood sugar 3-4 times per day. She states that she takes her meds as prescribed 95% of the time. Her goal for CARDIAC REHAB is to get her energy back. She carries her NTG with her, and has only needed to use it once. Burnis Kingfisher will join the 8:15 class on 01/28/20. Burnis Kingfisher is progressing in CR. She has missed several sessions due to GI problems. Her BP is WNL. She manages her blood sugar with diet and medication and exercise. She weighs daily. she states that she takes her meds as prescribed. She receives education modules via MyChart. Burnis Kingfisher has missed several sessions due to other health issues. we will discharge her now, and will he happy to have her return when her other issues allow. Burnis Kingfisher has missed several sessions due to other health issues. we will discharge her now, and will he happy to have her return when her other issues allow.       OTHER CORE COMPONENTS EDUCATION CLASSES:  OTHER CORE COMPONENT EDUCATION 03/02/2020   Core Component Ed Medications   Attend Date 03/03/2020   Other Comments [facility] Chart           Signed:  [place] Callas, RN  06/27/2020 10:11 AM

## 2020-06-28 ENCOUNTER — Ambulatory Visit: Admit: 2020-06-28 | Discharge: 2020-07-27 | Payer: PRIVATE HEALTH INSURANCE

## 2020-06-28 ENCOUNTER — Encounter: Admit: 2020-06-28 | Discharge: 2020-07-27 | Payer: PRIVATE HEALTH INSURANCE

## 2020-06-28 ENCOUNTER — Institutional Professional Consult (permissible substitution): Admit: 2020-06-28 | Discharge: 2020-07-27 | Payer: PRIVATE HEALTH INSURANCE

## 2020-06-28 ENCOUNTER — Institutional Professional Consult (permissible substitution)
Admit: 2020-06-28 | Discharge: 2020-07-27 | Payer: PRIVATE HEALTH INSURANCE | Attending: Internal Medicine | Primary: Internal Medicine

## 2020-06-28 ENCOUNTER — Institutional Professional Consult (permissible substitution)
Admit: 2020-06-28 | Discharge: 2020-07-27 | Payer: PRIVATE HEALTH INSURANCE | Attending: Student in an Organized Health Care Education/Training Program | Primary: Student in an Organized Health Care Education/Training Program

## 2020-06-28 ENCOUNTER — Encounter
Admit: 2020-06-28 | Discharge: 2020-07-27 | Payer: PRIVATE HEALTH INSURANCE | Attending: Student in an Organized Health Care Education/Training Program | Primary: Student in an Organized Health Care Education/Training Program

## 2020-06-28 NOTE — Unmapped (Signed)
Breeanna re-started cardiac rehab today. She used the Nustep for 30 minutes. She was asymptomatic throughout the session but was fatigued at the end of her 30 minutes. We checked Rai early without her participating in the strength training; she can try that part of the program next week.

## 2020-06-30 NOTE — Unmapped (Signed)
[place] Assessment of Medications Program (CAMP)        DIABETES RECRUITMENT SUMMARY NOTE     Patient was identified for CAMP Services.   A letter has been sent explaining program services.    The following quality measure gaps were identified: A1C Control    Shinichi Anguiano [place]  Clinical Operations Specialist/CPhT  [place] of [facility]ication Program (CAMP)  (P) [phone]

## 2020-07-03 ENCOUNTER — Other Ambulatory Visit: Payer: Self-pay | Admitting: Family Medicine

## 2020-07-03 MED ORDER — LOSARTAN 25 MG TABLET
ORAL_TABLET | 1 refills | 0 days
Start: 2020-07-03 — End: ?

## 2020-07-04 MED ORDER — LOSARTAN 25 MG TABLET
ORAL_TABLET | 3 refills | 0 days | Status: CP
Start: 2020-07-04 — End: ?

## 2020-07-04 MED ORDER — HYDROXYZINE HCL 25 MG TABLET
ORAL_TABLET | 1 refills | 0 days | Status: CP
Start: 2020-07-04 — End: ?

## 2020-07-04 NOTE — Unmapped (Signed)
Patient is requesting the following refill  Requested Prescriptions     Pending Prescriptions Disp Refills   ??? losartan (COZAAR) 25 MG tablet [Pharmacy Med Name: LOSARTAN 25MG  TABLETS] 90 tablet 1     Sig: TAKE 1 TABLET(25 MG) BY MOUTH DAILY   ??? hydrOXYzine (ATARAX) 25 MG tablet [Pharmacy Med Name: HYDROXYZINE HCL 25MG  TABS (WHITE)] 90 tablet 0     Sig: TAKE 1 TABLET(25 MG) BY MOUTH EVERY 8 HOURS AS NEEDED       Order pended. Please advise. Thanks    Last OV: 06/19/2020   Next OV: 07/31/2020

## 2020-07-11 NOTE — Unmapped (Signed)
[place] Assessment of Medications Program (CAMP)         DIABETES RECRUITMENT SUMMARY NOTE     Patient was outreached for CAMP Services. Patient requests call back. around 4pm        [facility]  Clinical Operations Specialist/CPhT  [place] Assessment of Medication Program (CAMP)  [phone]

## 2020-07-12 MED ORDER — EMPTY CONTAINER
2 refills | 0 days
Start: 2020-07-12 — End: ?

## 2020-07-12 NOTE — Unmapped (Signed)
[place] Assessment of Medications Program (CAMP)         DIABETES RECRUITMENT SUMMARY NOTE     Patient was outreached for CAMP Services. Patient declined.    Benna Dunks [place]  Clinical Operations Specialist/CPhT  [place] Assessment of Medication Program (CAMP)  (P) [phone]

## 2020-07-13 ENCOUNTER — Encounter: Admit: 2020-07-13 | Discharge: 2020-07-14 | Payer: PRIVATE HEALTH INSURANCE | Attending: Family | Primary: Family

## 2020-07-13 ENCOUNTER — Encounter: Admit: 2020-07-13 | Discharge: 2020-07-14 | Payer: PRIVATE HEALTH INSURANCE

## 2020-07-13 DIAGNOSIS — M25572 Pain in left ankle and joints of left foot: Principal | ICD-10-CM

## 2020-07-13 DIAGNOSIS — M25372 Other instability, left ankle: Principal | ICD-10-CM

## 2020-07-13 DIAGNOSIS — M21371 Foot drop, right foot: Principal | ICD-10-CM

## 2020-07-13 DIAGNOSIS — M21372 Foot drop, left foot: Principal | ICD-10-CM

## 2020-07-13 NOTE — Unmapped (Signed)
Thank you for choosing [facility]!  We appreciate the opportunity to participate in your care. Please let us know if we can be of assistance your orthopaedic issues in the future.      If you have questions or concerns, please do not hesitate to contact us by [facility] or by calling [phone] to speak with one of our clinical support sports team members.      [place] MyChart Website: [email or web address]        Appointment Scheduling: [phone]     Walk-in hours:          [facility]:  Monday - Friday 8 am - 5 pm  [address]  [address] Floor, [address]  [place], [place]  [address]        __________________________________________________________________________     Patient Specific Information: Use AFO braces when walking, continue use of walker.      __________________________________________________________________________

## 2020-07-13 NOTE — Unmapped (Signed)
[facility]  Date: 07/13/2020     Primary Care Physician: Sherol Dade, MD          ASSESSMENT:    ICD-10-CM   1. 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 Foot drop, bilateral  M21.371    M21.372   3. Acute left ankle pain  M25.572       PLAN:  Given her difficulty with ambulation at baseline and overall weakness, I do not recommend continuing with a controlled ankle motion boot rather returning back to her AFOs using a walker for ambulation.  I do think that she would benefit from having more stabilizing ankle braces, possibly [place] braces.  I recommended follow-up with Wallis Bamberg, foot and ankle nurse practitioner, to determine the most appropriate stabilizing treatment.  We will proceed with the following treatment plan:  1. Medications: No new medication today  2. DME/Cast:     none    3. PT/OT: none  4. Injections:   None    Scheduling Notes:  Return for First available with Wallis Bamberg for bilateral foot drop and ankle instability.    - X-rays to be ordered at next visit: None       Requested Prescriptions      No prescriptions requested or ordered in this encounter        No orders of the defined types were placed in this encounter.      SUBJECTIVE:  Chief Complaint:  L ankle pain  History of Present Illness:   Pamela Gutierrez is a 30 y.o. female who presents for L ankle pain. Wearing CAM for 2.5-3 weeks. Had injury at work when she was dragged by a dog in a parking lot. Had L peroneal nerve release on 03/23/19 at [facility]. Pain improved after surgery, but ankle swelling remained. Had a trip and fall at home about 2 months ago. PCP ordered multiple x-rays. Uses walker since slip and fall Sept 2021.   She reports using an AFO on the left lower extremity at baseline but this is quite old and states that she has similar function on the right lower extremity but has never had a custom AFO and uses an AFO she gets off of [employer].    Pain Score: 0-10 Pain Scale: 3 (as reported on intake)    ROS:    .       Pertinent positives and negatives are documented in the HPI. All other systems reviewed are negative. Patient was instructed to follow-up with the appropriate provider as necessary for all pertinent positives not related to today's encounter.    Medical History:  Past Medical History:   Diagnosis Date   ??? Allergic    ??? Anemia    ??? Asthma    ??? Eczema    ??? Gastroparesis    ??? GERD (gastroesophageal reflux disease)    ??? Hyperlipidemia    ??? Hypertension    ??? Murmur, cardiac    ??? Obesity    ??? 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 Type 2 diabetes mellitus (CMS-HCC)     BS- 90-130       Surgical History:  Past Surgical History:   Procedure Laterality Date   ??? ABLATION OF DYSRHYTHMIC FOCUS     ??? CARDIAC CATHETERIZATION     ??? CHOLECYSTECTOMY  2015   ??? CORONARY STENT PLACEMENT     ??? ELBOW FRACTURE SURGERY     ??? PR CATH  PLACE/CORON ANGIO, IMG SUPER/INTERP,W LEFT HEART VENTRICULOGRAPHY N/A 11/12/2019    Procedure: Left Heart Catheterization;  Surgeon: Alvira Philips, MD;  Location: [facility] CATH;  Service: Cardiology   ??? PR COMPRE EP EVAL ABLTJ 3D MAPG TX SVT N/A 06/04/2019    Procedure: Accessory Pathway Ablation;  Surgeon: Joretta Bachelor, MD;  Location: [facility] EP;  Service: Cardiology   ??? PR ELECTROPHYS EV,R A-V PACE/REC,W/O INDUCT N/A 06/04/2019    Procedure: Comprehensive Study W IND;  Surgeon: Joretta Bachelor, MD;  Location: [facility] EP;  Service: Cardiology   ??? PR EXC SWEAT GLAND LESN AXILL,SIMPL Right 10/30/2015    Procedure: RIGHT AXILLAE EXCISE HIDRADENITIS;  Surgeon: Oren Section, MD;  Location: [facility];  Service: Plastics   ??? PR NEGATIVE PRESSURE WOUND THERAPY DME </= 50 SQ CM N/A 10/30/2015    Procedure: WOUND VAC PLACEMENT;  Surgeon: Oren Section, MD;  Location: [facility];  Service: Plastics   ??? PR SPLIT GRFT TRUNK,ARM,LEG <100 SQCM N/A 10/30/2015    Procedure: SPLIT-THICKNESS SKIN GRAFT;  Surgeon: Oren Section, MD;  Location: [facility];  Service: Plastics       Medications:  ??? acetaminophen (TYLENOL 8 HOUR) 650 MG CR tablet Take 650 mg by mouth daily.   ??? acyclovir (ZOVIRAX) 400 MG tablet Take 400 mg by mouth once as needed.    ??? albuterol HFA 90 mcg/actuation inhaler Inhale 2 puffs every four (4) hours as needed.    ??? amLODIPine (NORVASC) 10 MG tablet Take 1 tablet (10 mg total) by mouth daily.   ??? amoxicillin-clavulanate (AUGMENTIN) 875-125 mg per tablet Take twice daily   ??? aspirin 81 MG chewable tablet Chew 1 tablet (81 mg total) daily.   ??? atorvastatin (LIPITOR) 80 MG tablet Take 1 tablet (80 mg total) by mouth daily.   ??? BELSOMRA 10 mg tablet TAKE 1 TABLET(10 MG) BY MOUTH EVERY NIGHT   ??? blood sugar diagnostic (GLUCOSE BLOOD) Strp Test once daily and as directed.one touch verio flex testing strips   ??? blood-glucose meter kit Currently has One Touch Ultra test strips. Please issue per her formulary.   ??? cetirizine 10 mg cap Take 10 mg by mouth daily as needed.    ??? clindamycin (CLEOCIN T) 1 % lotion Once daily to body areas that are typically affected   ??? clobetasoL (TEMOVATE) 0.05 % ointment Apply topically Two (2) times a day. As needed for flares up to a week at a time   ??? cyclobenzaprine (FLEXERIL) 10 MG tablet TAKE 1 TABLET(10 MG) BY MOUTH EVERY NIGHT   ??? empty container (SHARPS-A-GATOR DISPOSAL SYSTEM) Misc Use as directed for sharps disposal   ??? evolocumab (REPATHA SYRINGE) 140 mg/mL Syrg Inject the contents of one pen (140 mg) under the skin every fourteen (14) days.   ??? ezetimibe (ZETIA) 10 mg tablet Take 1 tablet (10 mg total) by mouth daily. To help lower cholesterol   ??? ferrous sulfate 325 (65 FE) MG tablet Take by mouth. Once daily PRN   ??? flash glucose scanning reader (FLASH GLUCOSE SCANNING READER) by Other route Take as directed. Dispense Libre 2 scanning reader, use as directed   ??? flash glucose sensor (FLASH GLUCOSE SENSOR) kit by Other route every fourteen (14) days. Dispense Libre 2 sensors, either 1 mo supply or 3 mo supply, as per patient preference; change q 14 days   ??? furosemide (LASIX) 40 MG tablet TAKE 1 TABLET BY MOUTH DAILY AS NEEDED FOR> 2  POUND WEIGHT GAIN IN A 24 HOUR PERIOD   ??? gabapentin (NEURONTIN) 300 MG capsule Take 300 mg (1 capsule) in the morning, 300 mg at noon, and 600 mg (2 capsules) in the evening.   ??? HYDROcodone-acetaminophen (NORCO) 5-325 mg per tablet Take 1 tablet every 6 hours as needed for pain   ??? hydrOXYzine (ATARAX) 25 MG tablet TAKE 1 TABLET(25 MG) BY MOUTH EVERY 8 HOURS AS NEEDED   ??? insulin ASPART (NOVOLOG FLEXPEN) 100 unit/mL (3 mL) injection pen Inject 0.06 mL (6 Units total) under the skin Three (3) times a day before meals. Take 5 units three times per day with meals (2 units with snacks/small meals), plus 2 extra for every 50 points over 150. Up to 50 units per day.   ??? insulin degludec (TRESIBA FLEXTOUCH U-100) 100 unit/mL (3 mL) InPn 36 units per day.   ??? insulin needles, disposable, (BD ULTRA-FINE NANO PEN NEEDLES) 32 x 5/32  Ndle Inject 1 each under the skin Five (5) times a day. Use 5x/day with Lantus, Novolog, and Victoza pens.   ??? insulin syringe-needle U-100 (BD INSULIN SYRINGE ULT-FINE II) 0.5 mL 31 gauge x 5/16 Syrg ok to sub any brand or size insulin syringe preferred by insurance/patient, use 5x/day, dx E11.65   ??? insulin syringe-needle U-100 0.3 mL 31 x 3/8 Syrg For use with Humulin R AC TID.   ??? losartan (COZAAR) 25 MG tablet TAKE 1 TABLET(25 MG) BY MOUTH DAILY   ??? metFORMIN (GLUCOPHAGE-XR) 500 MG 24 hr tablet Take 1 tablet (500 mg total) by mouth two (2) times a day.   ??? metoclopramide (REGLAN) 10 MG tablet Take 1 tablet (10 mg total) by mouth four (4) times a day as needed. Take 1 tablet 4 times daily as neeed   ??? metoprolol succinate (TOPROL-XL) 100 MG 24 hr tablet Take 1 tablet (100 mg total) by mouth every morning.   ??? montelukast (SINGULAIR) 10 mg tablet Take 10 mg by mouth daily as needed.    ??? nitroglycerin (NITROSTAT) 0.4 MG SL tablet Place 1 tablet (0.4 mg total) under the tongue every five (5) minutes as needed for chest pain. Maximum of 3 doses in 15 minutes.   ??? NORLYDA 0.35 mg tablet TAKE 1 TABLET(0.35 MG) BY MOUTH DAILY   ??? omeprazole (PRILOSEC) 20 MG capsule Take 20 mg by mouth daily.   ??? ondansetron (ZOFRAN-ODT) 4 MG disintegrating tablet Take 1 tablet (4 mg total) by mouth every eight (8) hours as needed for nausea.   ??? prasugreL (EFFIENT) 10 mg tablet Take 1 tablet (10 mg total) by mouth daily.   ??? promethazine (PHENERGAN) 25 MG tablet Take 1 tablet (25 mg total) by mouth every six (6) hours as needed for nausea.   ??? semaglutide (OZEMPIC) 0.25 mg or 0.5 mg(2 mg/1.5 mL) PnIj injection Start 0.25 mg/week and increase after one month to 0.5 mg/week as directed by MD   ??? sertraline (ZOLOFT) 50 MG tablet Take 1.5 tablets (75 mg total) by mouth daily.   ??? simethicone 125 mg cap Take 120 mg by mouth two (2) times a day as needed.   ??? spironolactone (ALDACTONE) 100 MG tablet Take 1 tablet (100 mg total) by mouth daily.   ??? triamcinolone (KENALOG) 0.1 % cream Apply topically once as needed.        Allergies:  Lanolin, Morphine (pf), Tomato, Venom-honey bee, Wool, Ibuprofen, Nsaids (non-steroidal anti-inflammatory drug), Iodinated contrast media, and Ioxaglate sodium    Social History:  Social History     Socioeconomic History   ??? Marital status: Single   Tobacco Use   ??? Smoking status: Current Some Day Smoker     Types: Cigars   ??? Smokeless tobacco: Never Used   ??? Tobacco comment: Black milds   Substance and Sexual Activity   ??? Alcohol use: Yes     Comment: occasional   ??? Drug use: Yes     Types: Marijuana   Other Topics Concern   ??? Do you use sunscreen? Yes   ??? Tanning bed use? No   ??? Are you easily burned? Yes   ??? Excessive sun exposure? No   ??? Blistering sunburns? Yes     Social Determinants of Health     Financial Resource Strain: Low Risk    ??? Difficulty of Paying Living Expenses: Not very hard   Food Insecurity: No Food Insecurity   ??? Worried About Running Out of Food in the Last Year: Never true   ??? Ran Out of Food in the Last Year: Never true   Transportation Needs: No Transportation Needs   ??? Lack of Transportation (Medical): No   ??? Lack of Transportation (Non-Medical): No       Family History:  Family History   Problem Relation Age of Onset   ??? Diabetes Mother    ??? Diabetes Father    ??? Heart disease Maternal Grandmother    ??? Diabetes Maternal Grandmother    ??? Heart disease Paternal Grandmother    ??? Melanoma Neg Hx    ??? Basal cell carcinoma Neg Hx    ??? Squamous cell carcinoma Neg Hx          OBJECTIVE:  DETAILED PHYSICAL EXAM   General Appearance ?? well-nourished, in no acute distress.  Estimated body mass index is 26.51 kg/m?? as calculated from the following:    Height as of 06/21/20: 166.4 cm (5' 5.51).    Weight as of 07/12/20: 73.4 kg (161 lb 12.8 oz).   Mood and Affect ?? alert, cooperative and pleasant.   Gait  ??  obvious foot drop with swinging leg gait   Cardiovascular ?? well-perfused distally and no swelling.   Sensation ?? sensation to light touch distally diminished      Bilateral pes planus.  Valgus alignment noted at the left ankle.  Tenderness palpation along the posterior tibial tendon and deltoid ligaments on the left.  Laxity with anterior drawer of the bilateral ankles.  Very minimal plantar flexion on the right no appreciable plantarflexion or dorsiflexion on the left.  Not able to move toes on the left side.  Foot is warm and well-perfused without excessive warmth, erythema or ecchymosis.    Test Results  Imaging:  X-rays of the bilateral ankles and feet were obtained today and independently interpreted by myself. These reveal:There is no acute fracture. There is bilateral pes planus and hindfoot valgus. Joint spaces are normal. No osseous erosions or focal soft tissue swelling.     *This note was created using Scientist, clinical (histocompatibility and immunogenetics). Errors may persist despite proofreading.       Ivor Reining, FNP

## 2020-07-14 ENCOUNTER — Encounter: Admit: 2020-07-14 | Discharge: 2020-07-15 | Payer: PRIVATE HEALTH INSURANCE

## 2020-07-14 DIAGNOSIS — R002 Palpitations: Principal | ICD-10-CM

## 2020-07-14 MED ADMIN — lidocaine (PF) (XYLOCAINE-MPF) 10 mg/mL (1 %) injection: @ 18:00:00 | Stop: 2020-07-14

## 2020-07-14 MED FILL — EMPTY CONTAINER: 120 days supply | Qty: 1 | Fill #0

## 2020-07-14 NOTE — Unmapped (Addendum)
[facility] - DEPARTMENT OF ELECTROPHYSIOLOGY       IMPLANTABLE LOOP RECORDER  Post-operative Instructions      Incision Care - If you have a clear, plastic dressing covering your incision, please remove this in 24 hours.  You may shower 48 hours after implant.  Do not submerge incision in water such as a pool or bath tub for 4 weeks.      Signs/symptoms to report: If you find any redness, swelling, drainage, warmth, or have a fever greater than 100 degrees, notify the Device Clinic at [phone]. If after hours call hospital operator at [phone] and ask for the Cardiology Fellow on call.    Pain Management. Ice packs may help reduce pain and the risk of bleeding.  Do not apply ice packs directly to the skin, and limit exposure to 20 minutes per hour.  Keep your incision dry for 48 hours. You may take over-the -counter Tylenol to help with the pain.    Activity - You may continue regular daily activities.  Prevent any hard blows to the site.    ID card - You will have a temporary ID card until a permanent card is sent to you by the device company.  The permanent card will look like a driver???s license or credit card and should arrive within 8 weeks.  CARRY YOUR ID CARD WITH YOU AT ALL TIMES.    Follow-up Care -   The transmitting device has been synced to your loop recorder.  Once you return home place the transmitting device in an area that will be within 8-10 feet of yourself at midnight every night.   The transmitting device will automatically transmit the days recorded information.  You will be contacted if there has been an event that warrants medical attention.      Questions or problems - If you have any questions or problems Monday-Friday 7:00a-5:00p call the Device Clinic.  For emergencies after hours or on weekends, please call the hospital operator at [phone] and ask for the Cardiology Fellow on call.      IMPORTANT PHONE NUMBERS:       [facility]: For all device related questions. If you leave a message, you should receive a call back within 24 hours.   Monday-Friday, 7:00 a.m. - 5:00 p.m.      Office: [phone]    Fax: [phone]   Email:  [email or web address]     Cardiology Fellow On-Call:   For after-hours or weekend-                                                      [phone]) [phone] and ask for Cardiology Fellow On-Call      Revised 04/2018    Listed below are your [employer] remote monitoring transmission dates for your implanted cardiac device checks.    Please note, this is NOT an office visit.  These are in addition to any visits scheduled with your physician.      Your monitor will automatically transmit on the following dates and no action is required by you   when your monitor is continuously connected.   Otherwise please do a manual transmission on   the following dates:    Future Appointments   Date Time Provider Department Center  07/17/2020  8:15 AM [facility] THERAPIST [facility]   07/19/2020  8:15 AM [facility] THERAPIST [facility]   07/21/2020  8:15 AM [facility] THERAPIST [facility]   07/21/2020  9:00 AM Ryan T Sanders [facility]   07/24/2020  8:15 AM [facility] THERAPIST [facility]   07/26/2020  8:15 AM [facility] THERAPIST [facility]   07/28/2020  8:15 AM [facility] THERAPIST [facility]   07/28/2020  3:00 PM Ancil Linsey, NP [facility]   07/31/2020  8:15 AM [facility] THERAPIST [facility]   07/31/2020  3:20 PM Loistine Chance, MD [facility]   08/02/2020  8:15 AM [facility] THERAPIST [facility]   08/04/2020  8:15 AM [facility] THERAPIST [facility]   08/07/2020  8:15 AM [facility] THERAPIST [facility]   08/09/2020  8:15 AM [facility] THERAPIST [facility]   08/11/2020  8:15 AM [facility] THERAPIST [facility]   08/11/2020 10:30 AM Volney Presser, MD [facility]   08/14/2020  8:15 AM [facility] THERAPIST [facility]   08/16/2020  8:15 AM [facility] THERAPIST [facility]   08/18/2020 12:00 AM [place] [facility] REMOTE MONITORING E[facility]   08/18/2020  8:15 AM [facility] THERAPIST [facility]   08/21/2020  8:15 AM [facility] THERAPIST [facility]   08/23/2020  8:15 AM [facility] THERAPIST [facility]   08/31/2020  3:00 PM Ardeth Sportsman, MD CARD [facility]   09/05/2020  3:00 PM Arelia Longest, MD [facility]   09/18/2020 12:00 AM [place] [facility] REMOTE MONITORING EPM[facility]   10/19/2020 12:00 AM [place] [facility] REMOTE MONITORING EPMONITORCH [facility]   11/20/2020 12:00 AM [place] [facility] REMOTE MONITORING EPMONITORCH [facility]   12/21/2020 12:00 AM [place] [facility] REMOTE MONITORING EPMONITORCH [facility]          If your transmission is not received on the date listed above or if your transmission shows anything of concern, you will be contacted by a member of our staff.  Please contact our office with any questions.     Please call the office and speak with someone before sending a transmission that is not scheduled or requested from you.  Your symptoms/concerns may not warrant a transmission and we may not see it right away as we are not expecting it to come to [place].      Phone: [phone]  Fax: [phone]  Email: [email or web address]    If you need help sending a transmission or your monitor is not working properly please call [employer] [phone] Monday-Friday 8:00am to 5:00 pm CT.    [email or web address]

## 2020-07-14 NOTE — Unmapped (Signed)
Cardiac Electrophysiology  History & Physical    History of Present Illness:   Pamela Gutierrez is a(n) 30 y.o. female with a history of rare but bothersome palpitations who presents for implantation of a loop recorder today.    Her Past Medical History, Problem List, Family and Social History, Allergies, and Medication List have been reviewed and updated in Epic.    Review of Systems:  As stated in the HPI, otherwise 10-point review of systems is negative    Physical Exam:  There were no vitals taken for this visit.  EYES: No scleral icterus or pallor  ENT: Oropharynx clear  RESPIRATORY: Normal work of breathing  CARDIOVASCULAR: Extremities appear well-perfused. No edema   GASTROINTESTINAL: Abdomen not distended  SKIN: Dry  HEMATOLOGICAL: No ecchymoses noted  NEUROLOGICAL: Grossly nonfocal  PSYCHOLOGICAL: Mood is appropriate    Labs, Reports, and available Outside records have been reviewed.    Lab Results   Component Value Date    WBC 6.4 03/29/2020    WBC 5.9 03/24/2013    HGB 14.5 03/29/2020    HGB 13.6 03/24/2013    HCT 41.3 03/29/2020    HCT 40.7 03/24/2013    Platelet 332 03/29/2020    Platelet 289 03/24/2013    INR 0.86 03/22/2020    Creatinine 0.56 (L) 03/29/2020    Creatinine 0.50 (L) 12/17/2013    Sodium 131 (L) 03/29/2020    Sodium 138 12/17/2013    Potassium 4.5 03/29/2020    Potassium 4.2 12/17/2013    Magnesium 1.9 03/20/2020    TSH 1.181 11/12/2019       ASSESSMENT AND PLAN   Pamela Gutierrez is a(n) 30 y.o. female with the above stated history who has been referred for loop recorder implantation.    SITE MARKING ATTESTATION   Site Marked: Yes    CONSENT FOR OPERATION OR PROCEDURE: PROVIDER CERTIFICATION   I hereby certify that the nature, purpose, benefits, usual and most frequent risks of, and alternatives to, the operation or procedure have been explained to the patient (or person authorized to sign for the patient) either by a physician or by the provider who is to perform the operation or procedure, that the patient has had an opportunity to ask questions, and that those questions have been answered. The patient or the patient's representative has been advised that selected tasks may be performed by assistants to the primary health care provider(s). I believe that the patient (or person authorized to sign for the patient) understands what has been explained, and has consented to the operation or procedure.

## 2020-07-14 NOTE — Unmapped (Signed)
ELECTROPHYSIOLOGY REPORT  ?   Date of Procedure: July 14, 2020   ?   Preoperative Diagnosis:   Palpitations   ?   Procedure(s) Performed:   Implantation of a loop recorder  ?   Proceduralist(s):   Huel Coventry, MD   ?   Description of Procedure:   ?   The left pectoral region was prepped and draped using sterile technique. Local anesthesia was achieved with lidocaine/bupivicaine. An approximately one-centimeter incision was made in the skin at the fourth intercostal space to the left of the sternum.    Using the supplied insertion tool, an implantable loop recorder was  placed subcutaneously via the skin incision. ILR sensing was tested  with a pacing system analyzer.  The skin edges were approximated with one layer of absorbable suture. Dermabond was applied to the incision.     There were no complications.    The attending electrophysiologist was present for the entire procedure.    Estimated blood loss:  5 mL    Specimen(s) removed:  None    Implanted:  Implants     Stent    Stent Resolute 2.5x30 Onyx Des - [id number] - Implanted  Coronary Artery - Right Coronary    Inventory item: STENT RESOLUTE 2.5X30 ONYX DES Model/Cat number: [id number]    Serial number: [id number] Manufacturer: [employer] [place]    Lot number: [id number] Size: 2.5 X 30    Device identifier: [id number] Device identifier type: GS1    GUDID Information     Request status Successful      Brand name: Resolute Onyx??? Version/Model: [id number]    Company name: [employer] MRI safety info as of 11/12/19: MR Conditional    Contains dry or latex rubber: No      GMDN P.T. name: Drug-eluting coronary artery stent, non-bioabsorbable-polymer-coated            As of 11/12/2019     Status: Implanted                        Findings:  R waves (mV): 0.6    Fluoro time: 0 mins  ?   In summary, this was an uncomplicated implantation of a loop recorder.       Huel Coventry, MD  July 14, 2020 1:58 PM

## 2020-07-14 NOTE — Unmapped (Signed)
Rwave 0.80mV at implant

## 2020-07-18 MED ORDER — PROMETHAZINE 25 MG TABLET
ORAL_TABLET | 3 refills | 0 days
Start: 2020-07-18 — End: ?

## 2020-07-18 NOTE — Unmapped (Signed)
Patient is requesting the following refill  Requested Prescriptions     Pending Prescriptions Disp Refills   ??? acyclovir (ZOVIRAX) 400 MG tablet [Pharmacy Med Name: ACYCLOVIR 400MG  TABLETS] 90 tablet 0     Sig: TAKE 1 TABLET(400 MG) BY MOUTH THREE TIMES DAILY   ??? promethazine (PHENERGAN) 25 MG tablet [Pharmacy Med Name: PROMETHAZINE 25MG  TABLETS] 60 tablet 3     Sig: TAKE 1 TABLET(25 MG) BY MOUTH EVERY 6 HOURS AS NEEDED FOR NAUSEA       Order pended. Please advise. Thanks    Last OV: 06/19/2020   Next OV: 07/31/2020

## 2020-07-19 MED ORDER — ACYCLOVIR 400 MG TABLET
ORAL_TABLET | 0 refills | 0 days | Status: CP
Start: 2020-07-19 — End: ?

## 2020-07-20 NOTE — Unmapped (Signed)
Called Pt left message about symptom that patient pushed on loop it appears to be [address].

## 2020-07-21 ENCOUNTER — Encounter: Admit: 2020-07-21 | Discharge: 2020-07-22 | Payer: PRIVATE HEALTH INSURANCE

## 2020-07-21 NOTE — Unmapped (Signed)
Diabetes Education Note    Referring Provider:  Arelia Longest MD    Time In / Out: 9-10am  60 min      Assessment:   Pt presents with Mother, Pamela Gutierrez. A1c today is 11.4%. Did not bring any glucose data as she lost the reader and has not really been using the glucose meter.     Plan:      Education Intervention:  -- get a new reader for about 50$. Maybe contact the company to see if they would replace  -- insulin plan reviewed to ensure clear understanding along with safety. Maximize correction scale but abide by the 4 hour rule. Evaristo Bury everyday regardless of food.  -- role of food and the liver on glucose control discussed.  -- titration of tresiba by 2 units every 4 days until 3/4 readings are under 140 instructed  -- carb counting review performed. Pt is working hard on this. My fav carb worksheet provided  -- gastroparesis guidelines discussed to support not inducing vomiting. Take insulin up to 30 minutes after eating if worried about vomitting or not eating what is expected. Ideally taken immediately after.  -- glucose targets for A1c and meter reviewed  -- will submit a new script for a reader today.    Educator Recommendations / Plan for Supervising Physician:  -- see education provided above  Subjective:        Desired Leaning Objective of Today  -- FU     Diabetes Meds:   Novolog ICR 10; 2:50>150; typically 10-14 units  Tresiba 50  Ozempic stopped for about 4 weeks  Metformin 500mg  twice per day XR; intended 1500mg  but causing stomach    Monitoring  -- Are you currently monitoring your sugar?  Lost reader  Has a meter but not really using    Objective:       Past Medical History:   Diagnosis Date   ??? Allergic    ??? Anemia    ??? Asthma    ??? Eczema    ??? Gastroparesis    ??? GERD (gastroesophageal reflux disease)    ??? Hyperlipidemia    ??? Hypertension    ??? Murmur, cardiac    ??? Obesity    ??? 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 Type 2 diabetes mellitus (CMS-HCC) BS- 90-130       Relevant Labs  HGB A1C, POC   Date/Time Value Ref Range Status   07/21/2020 08:52 AM 11.4 (H) <7.0 % Final     Comment:     A1c Glycemic Goal: <7.0%     **Goals should be individualized; more or less stringent A1c glycemic goals may be appropriate for individual patients.      (Adopted from: 2020 ADA Standards of Medical Care In Diabetes)  Point of Care A1c testing is not FDA-approved for the diagnosis of Diabetes.   02/16/2020 01:13 PM 11.6 (H) <7.0 % Final     Comment:     A1c Glycemic Goal: <7.0%     **Goals should be individualized; more or less stringent A1c glycemic goals may be appropriate for individual patients.      (Adopted from: 2020 ADA Standards of Medical Care In Diabetes)  Point of Care A1c testing is not FDA-approved for the diagnosis of Diabetes.   03/25/2014 12:43 PM 11.4 (H) 4.8 - 6.0 % Final     Comment:     Performed by:  [facility]  [address]  [address]  [place], [place]  [address]     12/17/2013 12:31 PM 10.7 (H) 4.8 - 6.0 % Final     Comment:     Performed by:  [facility]  [address]  [place], [place]  [address]       Hemoglobin A1C   Date/Time Value Ref Range Status   05/15/2020 03:42 PM 12.2 (H) 4.8 - 5.6 % Final         Glucose Data  -- None to evaluate          Cathrine Muster. RD. CDCES

## 2020-07-26 NOTE — Unmapped (Signed)
CARDIAC REHABILITATION  INDIVIDUALIZED TREATMENT PLAN  ITP Phase: 60 day   Date: 07/26/2020    Patient Name: Pamela Gutierrez                                           AACVPR Risk: High Risk  Angina Scale: No symptoms  Diagnosis:    Diagnosis ICD-10-CM Associated Orders   1. ST elevation myocardial infarction involving right coronary artery (CMS-HCC)  I21.11          EXERCISE PRESCRIPTION    EXERCISE GOALS:  EXERCISE GOALS 01/20/2020 01/31/2020 02/14/2020 03/13/2020 04/03/2020 06/27/2020 07/26/2020   Patient goal Improve energy level Improve energy level Improve energy level Improve energy level Improve energy level Improve energy level Improve energy level   Other Goals Patient will demonstrate an ability to take their own pulse;Patient will demonstrate an understanding of their exercise prescription;Patient will demonstrate knowledge of safe exercise parameters;Patient will increase the duration and/or intensity of exercise;Increase MET level as appropriate Patient will demonstrate an ability to take their own pulse;Patient will demonstrate an understanding of their exercise prescription;Patient will demonstrate knowledge of safe exercise parameters;Patient will increase the duration and/or intensity of exercise;Increase MET level as appropriate Patient will demonstrate an ability to take their own pulse;Patient will demonstrate an understanding of their exercise prescription;Patient will demonstrate knowledge of safe exercise parameters;Patient will increase the duration and/or intensity of exercise;Increase MET level as appropriate Patient will demonstrate an ability to take their own pulse;Patient will demonstrate an understanding of their exercise prescription;Patient will demonstrate knowledge of safe exercise parameters;Patient will increase the duration and/or intensity of exercise;Increase MET level as appropriate Patient will demonstrate an ability to take their own pulse;Patient will demonstrate an understanding of their exercise prescription;Patient will demonstrate knowledge of safe exercise parameters;Patient will increase the duration and/or intensity of exercise;Increase MET level as appropriate Patient will demonstrate an ability to take their own pulse;Patient will demonstrate an understanding of their exercise prescription;Patient will demonstrate knowledge of safe exercise parameters;Patient will increase the duration and/or intensity of exercise;Increase MET level as appropriate Patient will demonstrate an ability to take their own pulse;Patient will demonstrate an understanding of their exercise prescription;Patient will demonstrate knowledge of safe exercise parameters;Patient will increase the duration and/or intensity of exercise;Increase MET level as appropriate   Progress towards goal In Progress;Verbalizes understanding In Progress;Verbalizes understanding In Progress;Verbalizes understanding In Progress;Verbalizes understanding No No No       EXERCISE INTERVENTIONS:  EXERCISE INTERVENTIONS 01/20/2020 01/31/2020 02/14/2020 03/13/2020 04/03/2020 06/27/2020 07/26/2020   Interventions Educate patient on Dyspnea scale;Educate patient on exercise prescription, THRR, RPE scale, and MET level;Educate patient on normal/abnormal response to exercise;Educate patient on pulse-taking techniques;Orient patient to equipment safety guidelines;Participate in warm-up and cool-down;Provide home exercise plan and community resources for maintenance Educate patient on Dyspnea scale;Educate patient on exercise prescription, THRR, RPE scale, and MET level;Educate patient on normal/abnormal response to exercise;Educate patient on pulse-taking techniques;Orient patient to equipment safety guidelines;Participate in warm-up and cool-down;Provide home exercise plan and community resources for maintenance Educate patient on Dyspnea scale;Educate patient on exercise prescription, THRR, RPE scale, and MET level;Educate patient on normal/abnormal response to exercise;Educate patient on pulse-taking techniques;Orient patient to equipment safety guidelines;Participate in warm-up and cool-down;Provide home exercise plan and community resources for maintenance Educate patient on Dyspnea scale;Educate patient on exercise prescription, THRR, RPE scale, and MET level;Educate patient on normal/abnormal response to exercise;Educate patient  on pulse-taking techniques;Orient patient to equipment safety guidelines;Participate in warm-up and cool-down;Provide home exercise plan and community resources for maintenance Educate patient on Dyspnea scale;Educate patient on exercise prescription, THRR, RPE scale, and MET level;Educate patient on normal/abnormal response to exercise;Educate patient on pulse-taking techniques;Orient patient to equipment safety guidelines;Participate in warm-up and cool-down;Provide home exercise plan and community resources for maintenance Educate patient on Dyspnea scale;Educate patient on exercise prescription, THRR, RPE scale, and MET level;Educate patient on normal/abnormal response to exercise;Educate patient on pulse-taking techniques;Orient patient to equipment safety guidelines;Participate in warm-up and cool-down;Provide home exercise plan and community resources for maintenance Educate patient on Dyspnea scale;Educate patient on exercise prescription, THRR, RPE scale, and MET level;Educate patient on normal/abnormal response to exercise;Educate patient on pulse-taking techniques;Orient patient to equipment safety guidelines;Participate in warm-up and cool-down;Provide home exercise plan and community resources for maintenance       EXERCISE ASSSESSMENT:  EXERCISE PRESCRIPTION 01/31/2020 02/14/2020 02/16/2020 03/13/2020 04/03/2020 06/27/2020 07/26/2020   ITP Phase Initial Initial Initial 30 day Early Exit Initial 60 day   Special Precautions / Protocols Indicated Continuous telemetry;Diabetic precautions;Fall precautions;Orthopedic precautions Continuous telemetry;Diabetic precautions;Fall precautions;Orthopedic precautions - Continuous telemetry;Diabetic precautions;Fall precautions;Orthopedic precautions Continuous telemetry;Diabetic precautions;Fall precautions;Orthopedic precautions Continuous telemetry;Diabetic precautions;Fall precautions;Orthopedic precautions Continuous telemetry;Diabetic precautions;Fall precautions;Orthopedic precautions   Supplemental O2 Required? No No - No No No No       AEROBIC CONDITIONING / EXERCISE PLAN:  AEROBIC CONDITIONING / EXERCISE PLAN 01/20/2020 01/31/2020 02/14/2020 03/13/2020 04/03/2020 06/27/2020 07/26/2020   Frequency per day 1x per day 1x per day 1x per day 1x per day 1x per day 1x per day 1x per day   Frequency per week 3xs weekly 3xs weekly 3xs weekly 3xs weekly 3xs weekly 3xs weekly 3xs weekly   Aerobic minutes per session 31 - 45 31 - 45 31 - 45 31 - 45 31 - 45 31 - 45 31 - 45   RPE/BORG 3 - 6 - - - - - -   THRR Low 130 130 130 130 130 130 130   THRR High 150 150 150 150 150 150 150   Determined Predicted HR Calculated on HR Reserve formula;Calculated on  age predicted HR Calculated on HR Reserve formula;Calculated on  age predicted HR Calculated on HR Reserve formula;Calculated on  age predicted HR Calculated on HR Reserve formula;Calculated on  age predicted HR Calculated on HR Reserve formula;Calculated on  age predicted HR Calculated on HR Reserve formula;Calculated on  age predicted HR Calculated on HR Reserve formula;Calculated on  age predicted HR   Rehab Progression Decrease rest interval duration;Gradually increase frequency and duration Decrease rest interval duration;Gradually increase frequency and duration Decrease rest interval duration;Gradually increase frequency and duration Decrease rest interval duration;Gradually increase frequency and duration Decrease rest interval duration;Gradually increase frequency and duration Decrease rest interval duration;Gradually increase frequency and duration Decrease rest interval duration;Gradually increase frequency and duration       AEROBIC CONDITIONING / MODES OF EXERCISE:  AEROBIC CONDITIONING / MODES OF EXERCISE: 01/20/2020 01/31/2020 02/14/2020 03/13/2020 04/03/2020 06/27/2020 07/26/2020   Stationary Bike Recumbent Recumbent Recumbent Recumbent Recumbent Recumbent Recumbent   Level 1 1 1 1 1  - -   Elliptical No No No No No No No   NuStep Yes Yes Yes Yes Yes Yes Yes   Level 3 3 3 5 5  - 4   Rower No No No No No No No   Track Yes Yes Yes Yes Yes Yes Yes   Treadmill Yes Yes Yes Yes Yes Yes Yes  MPH 2 2 2 2 2  - -   UBE Yes Yes Yes Yes Yes Yes Yes   Level 2 2 2 2 2  - -       STRENGTH TRAINING / EXERCISE PLAN:  RESISTANCE TRAINING 01/20/2020 01/31/2020 02/14/2020 03/13/2020 04/03/2020 06/27/2020 07/26/2020   Resistance Training Yes Yes Yes Yes Yes Yes Yes   Modes theraband;body weight theraband;body weight theraband;body weight theraband;body weight theraband;body weight - -   Frequency 2-3 x week 2-3 x week 2-3 x week 2-3 x week 2-3 x week - -   Intensity / RPE 3 - 6 3 - 6 3 - 6 3 - 6 3 - 6 - -   Repetitions 10 - 12 10 - 12 10 - 12 10 - 12 10 - 12 - -   Sets 2 2 2 2 2  - -   Limitations or precautions orthopedics orthopedics orthopedics orthopedics orthopedics - -   Progression Increase resistance amount when patient successfully completes the prescribed number of  sets and reps with RPE below 3 (1-10 scale) Increase resistance amount when patient successfully completes the prescribed number of  sets and reps with RPE below 3 (1-10 scale) Increase resistance amount when patient successfully completes the prescribed number of  sets and reps with RPE below 3 (1-10 scale) Increase resistance amount when patient successfully completes the prescribed number of  sets and reps with RPE below 3 (1-10 scale) Increase resistance amount when patient successfully completes the prescribed number of  sets and reps with RPE below 3 (1-10 scale) - - OTHER TRAINING / EXERCISE PLAN:  OTHER TRAINING 01/31/2020 02/14/2020 03/13/2020 04/03/2020 06/27/2020 06/28/2020 07/26/2020   Balance Training Yes - Daily dynamic balance exercises in warm up and cool down phase of aerobic exercise session. Yes - Daily dynamic balance exercises in warm up and cool down phase of aerobic exercise session. Yes - Daily dynamic balance exercises in warm up and cool down phase of aerobic exercise session. Yes - Daily dynamic balance exercises in warm up and cool down phase of aerobic exercise session. Yes - Daily dynamic balance exercises in warm up and cool down phase of aerobic exercise session. - Yes - Daily dynamic balance exercises in warm up and cool down phase of aerobic exercise session.   Flexibility training type static stretching post cardio or resistance training for all major muscle groups x 16 - 30sec duration. static stretching post cardio or resistance training for all major muscle groups x 16 - 30sec duration. static stretching post cardio or resistance training for all major muscle groups x 16 - 30sec duration. static stretching post cardio or resistance training for all major muscle groups x 16 - 30sec duration. static stretching post cardio or resistance training for all major muscle groups x 16 - 30sec duration. - static stretching post cardio or resistance training for all major muscle groups x 16 - 30sec duration.   Home Exercise Yes Yes Yes Yes Yes - Yes   Other Exercise Comments - Today was Pamela Gutierrez's initial cardiac rehab exercise session. Patient was educated on what signs/symptoms should be reported to the cardiac rehab staff. Patient was also educated on Diplomatic Services operational officer. Pamela Gutierrez managed 33 minutes on the Nu-Step at level 3. Exercise was tolerated with no signs/symptoms or complaints. However, pre exercise blood glucose was 374. Due to the elevated reading, cardiologist on staff was consulted and patient deemed safe to exercise. Pamela Gutierrez stated she had not taken any of her diabetes medicine this morning as  she was nervous to have a low blood glucose reading. Discussed the importance of taking her medication as prescribed. She verbalized understanding. Post exercise blood glucose was 317. Will continue to monitor blood glucose levels pre and post exercise for the next few sessions. Patient demonstrated verbal understanding of all recommendations and education during today???s session. Patient was monitored via telemetry and we will notify the MD if changes or symptoms are noted. Patient understands that masks are required to be worn during each visit with cardiac rehab. Pamela Gutierrez is progressing in strength and endurance. She has increased her workload on the NuStep. She states that she feels she has gained endurance. She is monitored by telemetry and no EKG changes have been noted. She demonstrates understanding of THRR, RPE scale and s/sx to report. Pamela Gutierrez will exit the program early due to other health issues. She has not met her goals. She will call us when she is able and we will request a new referral. Pamela Gutierrez will exit the program early due to other health issues. She has not met her goals. She will call us when she is able and we will request a new referral. Pamela Gutierrez re-started cardiac rehab today. She used the Nustep for 30 minutes. She was asymptomatic throughout the session but was fatigued at the end of her 30 minutes. We checked Pamela Gutierrez early without her participating in the strength training; she can try that part of the program next week. Pamela Gutierrez states that her energy is increasing. She uses an exercise band for RT on tuesday and thursday. Her Mom and brother feel she is walking better. she is monitored by telemetry and no EKG changes have been noted. She demonstrates understanding of THRR, RPE scale, and s/sx to report.       EXERCISE EDUCATION CLASSES:  EXERCISE EDUCATION 03/28/2020 03/28/2020   Exercise Education Exercise for Life Strength Training Attend date 03/24/2020 03/31/2020   Other comments [facility] Chart [facility]         NUTRITION ASSESSMENT    NUTRITION GOALS:  NUTRITION GOALS 01/20/2020 01/31/2020 02/14/2020 03/13/2020 04/03/2020 06/27/2020 07/26/2020   Other Goals Patient will lose 1-2 lbs. per week if BMI is greater than 25;Patient will improve nutrition assessment score;Patient will improve serum cholesterol status;Patient will decrease waist circumference;Patient will increase daily water intake Patient will lose 1-2 lbs. per week if BMI is greater than 25;Patient will improve nutrition assessment score;Patient will improve serum cholesterol status;Patient will decrease waist circumference;Patient will increase daily water intake Patient will lose 1-2 lbs. per week if BMI is greater than 25;Patient will improve nutrition assessment score;Patient will improve serum cholesterol status;Patient will decrease waist circumference;Patient will increase daily water intake Patient will lose 1-2 lbs. per week if BMI is greater than 25;Patient will improve nutrition assessment score;Patient will improve serum cholesterol status;Patient will decrease waist circumference;Patient will increase daily water intake Patient will lose 1-2 lbs. per week if BMI is greater than 25;Patient will improve nutrition assessment score;Patient will improve serum cholesterol status;Patient will decrease waist circumference;Patient will increase daily water intake Patient will lose 1-2 lbs. per week if BMI is greater than 25;Patient will improve nutrition assessment score;Patient will improve serum cholesterol status;Patient will decrease waist circumference;Patient will increase daily water intake Patient will lose 1-2 lbs. per week if BMI is greater than 25;Patient will improve nutrition assessment score;Patient will improve serum cholesterol status;Patient will decrease waist circumference;Patient will increase daily water intake   Progress towards goal Verbalizes understanding Verbalizes understanding Verbalizes understanding Verbalizes understanding [employer]  understanding Verbalizes understanding Verbalizes understanding       NUTRITION INTERVENTIONS:  NUTRITION INTERVENTIONS 01/20/2020 01/31/2020 02/14/2020 03/13/2020 04/03/2020 06/27/2020 07/26/2020   Interventions Educate the patient on healthy weight loss;Attend nutrition education classes;Monitor weight at each exercise session;Attend individual consultation with the RD;Encourage patient to bring water bottle to each exercise session;Educate the patient on the importance of proper hydration;Educate the patient on correlation between heart disease and cholesterol status Educate the patient on healthy weight loss;Attend nutrition education classes;Monitor weight at each exercise session;Attend individual consultation with the RD;Encourage patient to bring water bottle to each exercise session;Educate the patient on the importance of proper hydration;Educate the patient on correlation between heart disease and cholesterol status Educate the patient on healthy weight loss;Attend nutrition education classes;Monitor weight at each exercise session;Attend individual consultation with the RD;Encourage patient to bring water bottle to each exercise session;Educate the patient on the importance of proper hydration;Educate the patient on correlation between heart disease and cholesterol status Educate the patient on healthy weight loss;Attend nutrition education classes;Monitor weight at each exercise session;Attend individual consultation with the RD;Encourage patient to bring water bottle to each exercise session;Educate the patient on the importance of proper hydration;Educate the patient on correlation between heart disease and cholesterol status Educate the patient on healthy weight loss;Attend nutrition education classes;Monitor weight at each exercise session;Attend individual consultation with the RD;Encourage patient to bring water bottle to each exercise session;Educate the patient on the importance of proper hydration;Educate the patient on correlation between heart disease and cholesterol status Educate the patient on healthy weight loss;Attend nutrition education classes;Monitor weight at each exercise session;Attend individual consultation with the RD;Encourage patient to bring water bottle to each exercise session;Educate the patient on the importance of proper hydration;Educate the patient on correlation between heart disease and cholesterol status Educate the patient on healthy weight loss;Attend nutrition education classes;Monitor weight at each exercise session;Attend individual consultation with the RD;Encourage patient to bring water bottle to each exercise session;Educate the patient on the importance of proper hydration;Educate the patient on correlation between heart disease and cholesterol status       NUTRITION ASSESSMENT:  NUTRITION ASSESSMENT 07/07/2020 07/12/2020 07/14/2020 07/14/2020 07/17/2020 07/19/2020 07/26/2020   Weight 162 lb 3.2 oz 161 lb 12.8 oz 162 lb 162 lb 162 lb 160 lb 14.4 oz 160 lb 14.4 oz   Height - - - 5' 5.5 - - -   BMI (Calculated) - - - 26.54 - - -   Method/Tool - - - - - - PYP   Score - - - - - - 65   Other Nutrition Comments - - - - - - Weight history:  Pt is content with her current weight.     Appetite/intake:  Pt eats 2 meals a day and snacks for lunch.  She is often not hungry and has granola bars and trail mix with low calorie sports drink sometime mid day.     Food allergies/prefs: Likes [place] bacon and sausage, trail mix, granola bars and sports drinks.     3-Day Food Record indicates:   Eats 2-3 times a day.  Diet is low in fresh fruit, low fat dairy (almond milk or yogurt), fatty fish and whole grains.     Estimated Nutrition Needs:       Energy:  1860   kcal/day     Protein:  60   g/day        Fluid:    75oz  /day     Nutrition Diagnosis:  Tries to follow a diet for DM and gatroparesis which is higher in saturated fat and sodium than desired.        Intervention Prescription/Goals:               Maintain wt:    Pt is content with her current weight.                                                                                                    Combine protein and carbohydrates at meals/snacks:  Encouraged to have protein every time that she eats.  Eat 5-6 small meals/day:  Encouraged her to eat smaller meals, more often.                                    Reduce sodium intake:   Reviewed Nutrition therapy for HTN and Low Sodium Seasoning Tips.           Avoid canned soups and make at home with unsalted broth.  Avoid sports beverages and go for water or flavored waters without added sodium.  Add more dairy into diet such as plain [place] yogurt and unsweetened almond milk.                                   Increase fruits and vegetables to 5+ servings/day:  Encouraged to add more fruits and vegetables that are fresh- not dried or canned.  Add veggies to eggs in the am instaed of bacon and sausage.  Improve lipids to within normal limits:   Add more fatty fish, limit bacon and sausage, add unsalted nuts, and limit granola bars.            Try overnight oatmeal.             Improve HgA1c to within normal limits:  Continue to limit sugar added beverages and desserts; have protein every time that she eats and limit dried fruit.     Monitor and Evaluate: Labs and weight     Dietitian Follow Up: Available as needed during episode of care in cardiac rehab.     February 16, 2020 12:49 PM  Ricke Hey Hinderliter       Lipids:  Total Cholesterol:   Cholesterol   Date Value Ref Range Status   02/21/2020 110 <=200 mg/dL Final     Cholesterol, Total   Date Value Ref Range Status   12/17/2013 169 100 - 199 mg/dL Final     Trig:   Triglycerides   Date Value Ref Range Status   11/12/2019 65 0 - 150 mg/dL Final   16/10/9602 540 (H) 1 - 149 mg/dL Final     Hdl:   HDL   Date Value Ref Range Status   02/21/2020 24 (L) 40 - 60 mg/dL Final 98/11/9145 27 (L) 40 - 59 mg/dL Final     LDL:   LDL Calculated   Date Value Ref Range Status  11/12/2019 87 40 - 99 mg/dL Final     Comment:     NHLBI Recommended Ranges, LDL Cholesterol, for Adults (20+yrs) (ATPIII), mg/dL  Optimal              <295  Near Optimal        100-129  Borderline High     130-159  High                160-189  Very High            >=190  NHLBI Recommended Ranges, LDL Cholesterol, for Children (2-19 yrs), mg/dL  Desirable            <621  Borderline High     110-129  High                 >=130       LDL Direct   Date Value Ref Range Status   06/21/2020 137.6 mg/dL Final     Comment:     NHLBI Recommended Ranges, LDL Cholesterol, for Adults (20+yrs) (ATPIII), mg/dL  Optimal              <308  Near Optimal        100-129  Borderline High     130-159  High                160-189  Very High            >=190  NHLBI Recommended Ranges, LDL Cholesterol, for Children (2-19 yrs), mg/dL  Desirable            <657  Borderline High     110-129  High                 >=130       11/18/2012 128 mg/dL Final     Comment:     :  ADULTS (20 years or older)  Optimal         <100  Near Optimal    100-129  Borderline High 130-159  High            160-189  Very High       >=190  CHILDREN (2-19 years)  Desirable       <110  Borderline High 110-129  High            >/= 130       NUTRITION EDUCATION CLASSES:  NUTRITION EDUCATION 02/16/2020 03/14/2020 07/04/2020 07/25/2020   Nutrition Education One to One Consult Reading Food Labels Low Sodium Lipid Management   Attend date 02/15/2020 03/17/2020 07/07/2020 07/25/2020   Other comments - [facility] Chart MyChart MyChart         PSYCHOSOCIAL ASSESSMENT    PSYCHOSOCIAL GOALS:  PSYCHOSOCIAL GOALS 01/20/2020 01/31/2020 02/14/2020 03/13/2020 04/03/2020 06/27/2020   Other Goals Patient will improve psychosocial assessment scores;Patient will maximize coping skills;Patient will demonstrate relaxation techniques;Patient will identify support systems;Patient will report decreased stress levels Patient will improve psychosocial assessment scores;Patient will maximize coping skills;Patient will demonstrate relaxation techniques;Patient will identify support systems;Patient will report decreased stress levels Patient will improve psychosocial assessment scores;Patient will maximize coping skills;Patient will demonstrate relaxation techniques;Patient will identify support systems;Patient will report decreased stress levels Patient will improve psychosocial assessment scores;Patient will maximize coping skills;Patient will demonstrate relaxation techniques;Patient will identify support systems;Patient will report decreased stress levels Patient will improve psychosocial assessment scores;Patient will maximize coping skills;Patient will demonstrate relaxation techniques;Patient will identify support systems;Patient will report decreased stress  levels Patient will improve psychosocial assessment scores;Patient will maximize coping skills;Patient will demonstrate relaxation techniques;Patient will identify support systems;Patient will report decreased stress levels   Progress towards goal In progress;Verbalizes understanding In progress;Verbalizes understanding In progress;Verbalizes understanding In progress;Verbalizes understanding In progress;Verbalizes understanding In progress;Verbalizes understanding       PSYCHOSOCIAL INTERVENTIONS:  PSYCHOSOCIAL INTERVENTIONS 01/20/2020 01/31/2020 02/14/2020 03/13/2020 04/03/2020 06/27/2020   Interventions Refer to Vocational Rehab;Attend stress management class;Attend individual consultation with mental health professional;Instruct in relaxation techniques and coping skills Refer to Vocational Rehab;Attend stress management class;Attend individual consultation with mental health professional;Instruct in relaxation techniques and coping skills Refer to Vocational Rehab;Attend stress management class;Attend individual consultation with mental health professional;Instruct in relaxation techniques and coping skills Refer to Vocational Rehab;Attend stress management class;Attend individual consultation with mental health professional;Instruct in relaxation techniques and coping skills Refer to Vocational Rehab;Attend stress management class;Attend individual consultation with mental health professional;Instruct in relaxation techniques and coping skills Refer to Vocational Rehab;Attend stress management class;Attend individual consultation with mental health professional;Instruct in relaxation techniques and coping skills       PSYCHOSOCIAL ASSESSMENT:  PSYCHOSOCIAL ASSESSEMENT 01/20/2020 01/31/2020 02/14/2020 03/10/2020 03/13/2020 04/03/2020 06/27/2020   Psychosocial Method/Tool ESSI ESSI ESSI - ESSI ESSI ESSI   Psychosocial Score 22 22 22  - 22 22 22    SF 12 Physical score 14 14 14  - 14 14 14    Depression Method/Tool PHQ9 PHQ9 PHQ9 - PHQ9 PHQ9 PHQ9   Depression Score 15 15 15  - 15 15 15    Anxiety Method/Tool GAD GAD GAD - GAD GAD GAD   Anxiety Score 9 9 9  - 9 9 9    Current State On meds On meds On meds - On meds On meds On meds   Other Psychosocial Comments Pamela Gutierrez reports good social support from friends and family. Her PHQ9 and GAD scores are high. Will refer her to psychosocial counselor. Pamela Gutierrez reports good social support from friends and family. Her PHQ9 and GAD scores are high. Will refer her to psychosocial counselor. Pamela Gutierrez reports good social support from friends and family. Her PHQ9 and GAD scores are high. Will refer her to psychosocial counselor. Per Covid 19 protocol for safety, CR Individual Stress Management services were provided. Stress: Identifies walking anywhere - causes fatigue and sometimes shortness of breath as primary stress. PHQ 9=  15 suggesting a moderately severe level of depression, including little interest or pleasure and low mood with occasional thoughts that it would be OK not to wake up, but has not thought of hurting herself, has difficulty falling and staying asleep, low energy, poor appetite, and feeling fidgety;  started Zoloft 75 mg daily about one month ago; is tolerating without side effects; discussed onset of action and likelihood that she will begin to see additional benefits over the next few months; has first outpatient, online psychotherapy appointment scheduled for March 8 GAD= 9 suggesting a moderate level of anxiety including worry, difficulty relaxing, feeling restless, easily annoyed, and fearful that something else bad will happen. ESSI= 22 indicating a high level of social support including family, friends, and a few co-workers   INTERVENTIONS: Reviewed results of PHQ. 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 Stated Goal to improve walking so she can switch from  walker to cane. Has home exercise plan from PT and goes to park to exercise with her brothers. Excellent attendance in cardiac rehab, so very motivated to improve functional status. Sleep difficulties started following skin graft under R armpit (DFA and DSA). Uses an app for sleep that a friend told her about. Recognizes racing heart when feeling distressed, and uses mindful breathing. Suggested she view Stress Management classes 1 and 4 to to help with these tools. Appreciates the support of family and friends, boss who welcomes her back to work when her health permits. 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 Follow up: Available while in cardiac rehab. Time: 70 minutes Per Covid 19 protocol for safety, CR Individual Stress Management services were provided. Stress: Identifies walking anywhere - causes fatigue and sometimes shortness of breath as primary stress. PHQ 9=  15 suggesting a moderately severe level of depression, including little interest or pleasure and low mood with occasional thoughts that it would be OK not to wake up, but has not thought of hurting herself, has difficulty falling and staying asleep, low energy, poor appetite, and feeling fidgety;  started Zoloft 75 mg daily about one month ago; is tolerating without side effects; discussed onset of action and likelihood that she will begin to see additional benefits over the next few months; has first outpatient, online psychotherapy appointment scheduled for March 8 GAD= 9 suggesting a moderate level of anxiety including worry, difficulty relaxing, feeling restless, easily annoyed, and fearful that something else bad will happen. ESSI= 22 indicating a high level of social support including family, friends, and a few co-workers   INTERVENTIONS: Reviewed results of PHQ. 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 Stated Goal to improve walking so she can switch from walker to cane. Has home exercise plan from PT and goes to park to exercise with her brothers. Excellent attendance in cardiac rehab, so very motivated to improve functional status. Sleep difficulties started following skin graft under R armpit (DFA and DSA). Uses an app for sleep that a friend told her about. Recognizes racing heart when feeling distressed, and uses mindful breathing. Suggested she view Stress Management classes 1 and 4 to to help with these tools. Appreciates the support of family and friends, boss who welcomes her back to work when her health permits. 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 Follow up: Available while in cardiac rehab. Time: 70 minutes Per Covid 19 protocol for safety, CR Individual Stress Management services were provided. Stress: Identifies walking anywhere - causes fatigue and sometimes shortness of breath as primary stress. PHQ 9=  15 suggesting a moderately severe level of depression, including little interest or pleasure and low mood with occasional thoughts that it would be OK not to wake up, but has not thought of hurting herself, has difficulty falling and staying asleep, low energy, poor appetite, and feeling fidgety;  started Zoloft 75 mg daily about one month ago; is tolerating without side effects; discussed onset of action and likelihood that she will begin to see additional benefits over the next few months; has first outpatient,  online psychotherapy appointment scheduled for March 8 GAD= 9 suggesting a moderate level of anxiety including worry, difficulty relaxing, feeling restless, easily annoyed, and fearful that something else bad will happen. ESSI= 22 indicating a high level of social support including family, friends, and a few co-workers   INTERVENTIONS: Reviewed results of PHQ. 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 Stated Goal to improve walking so she can switch from walker to cane. Has home exercise plan from PT and goes to park to exercise with her brothers. Excellent attendance in cardiac rehab, so very motivated to improve functional status. Sleep difficulties started following skin graft under R armpit (DFA and DSA). Uses an app for sleep that a friend told her about. Recognizes racing heart when feeling distressed, and uses mindful breathing. Suggested she view Stress Management classes 1 and 4 to to help with these tools. Appreciates the support of family and friends, boss who welcomes her back to work when her health permits. 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 Follow up: Available while in cardiac rehab. Time: 70 minutes Per Covid 19 protocol for safety, CR Individual Stress Management services were provided. Stress: Identifies walking anywhere - causes fatigue and sometimes shortness of breath as primary stress. PHQ 9=  15 suggesting a moderately severe level of depression, including little interest or pleasure and low mood with occasional thoughts that it would be OK not to wake up, but has not thought of hurting herself, has difficulty falling and staying asleep, low energy, poor appetite, and feeling fidgety;  started Zoloft 75 mg daily about one month ago; is tolerating without side effects; discussed onset of action and likelihood that she will begin to see additional benefits over the next few months; has first outpatient, online psychotherapy appointment scheduled for March 8 GAD= 9 suggesting a moderate level of anxiety including worry, difficulty relaxing, feeling restless, easily annoyed, and fearful that something else bad will happen. ESSI= 22 indicating a high level of social support including family, friends, and a few co-workers   INTERVENTIONS: Reviewed results of PHQ. 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 Stated Goal to improve walking so she can switch from walker to cane. Has home exercise plan from PT and goes to park to exercise with her brothers. Excellent attendance in cardiac rehab, so very motivated to improve functional status. Sleep difficulties started following skin graft under R armpit (DFA and DSA). Uses an app for sleep that a friend told her about. Recognizes racing heart when feeling distressed, and uses mindful breathing. Suggested she view Stress Management classes 1 and 4 to to help with these tools. Appreciates the support of family and friends, boss who welcomes her back to work when her health permits. 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 Follow up: Available while in cardiac rehab. Time:  70 minutes       PSYCHOSOCIAL EDUCATION CLASSES:  PSYCHOSOCIAL EDUCATION 01/20/2020 02/15/2020 03/07/2020 03/10/2020 07/18/2020   Psychosocial Education Stress management Relaxation Techniques Other One to One Consult Other   Attend date 01/20/2020 02/18/2020 03/10/2020 03/10/2020 07/18/2020   Other comments MyChart Videos [facility] Chart [facility] Chart Individual stress management meeting Meditation Mychart         OTHER CORE COMPONENTS    BLOOD PRESSURE:  BLOOD PRESSURE 01/20/2020 01/31/2020 02/14/2020 03/13/2020 04/03/2020 06/27/2020 07/26/2020   Other Goals Patient will maintain appropriate blood pressure readings less than 130/80 Patient will maintain appropriate blood pressure readings less than 130/80 Patient will maintain appropriate blood pressure readings less than 130/80 Patient will maintain appropriate blood pressure readings less than 130/80 Patient will maintain appropriate blood pressure readings less than 130/80 Patient will maintain appropriate blood pressure readings less than 130/80 Patient will maintain appropriate blood pressure readings less than 130/80   Interventions Monitor blood pressure at each session;Educate on importance of taking prescribed blood pressure medication correctly;Educate on importance of regular exercise program;Educate on importance of maintaining appropriate diet and weight Monitor blood pressure at each session;Educate on importance of taking prescribed blood pressure medication correctly;Educate on importance of regular exercise program;Educate on importance of maintaining appropriate diet and weight Monitor blood pressure at each session;Educate on importance of taking prescribed blood pressure medication correctly;Educate on importance of regular exercise program;Educate on importance of maintaining appropriate diet and weight Monitor blood pressure at each session;Educate on importance of taking prescribed blood pressure medication correctly;Educate on importance of regular exercise program;Educate on importance of maintaining appropriate diet and weight Monitor blood pressure at each session;Educate on importance of taking prescribed blood pressure medication correctly;Educate on importance of regular exercise program;Educate on importance of maintaining appropriate diet and weight Monitor blood pressure at each session;Educate on importance of taking prescribed blood pressure medication correctly;Educate on importance of regular exercise program;Educate on importance of maintaining appropriate diet and weight Monitor blood pressure at each session;Educate on importance of taking prescribed blood pressure medication correctly;Educate on importance of regular exercise program;Educate on importance of maintaining appropriate diet and weight   Progress towards goal In progress;Verbalizes understanding In progress;Verbalizes understanding In progress;Verbalizes understanding In progress;Verbalizes understanding In progress;Verbalizes understanding In progress;Verbalizes understanding In progress;Verbalizes understanding     Most recent BP:      DIABETES:  DIABETES 01/20/2020 01/31/2020 02/14/2020 03/13/2020 04/03/2020 06/27/2020 07/26/2020   Other Goals Patient will maintain an acceptable blood sugar range Patient will maintain an acceptable blood sugar range Patient will maintain an acceptable blood sugar range Patient will maintain an acceptable blood sugar range Patient will maintain an acceptable blood sugar range Patient will maintain an acceptable blood sugar range Patient will maintain an acceptable blood sugar range   Interventions Educate patient on compliance with home blood sugar monitoring as prescribed by physician;Educate patient on signs and symptoms of hypo- and hyperglycemia;Instruct patient to monitor blood sugar before and after exercise;Referral to Diabetes Education Educate patient on compliance with home blood sugar monitoring as prescribed by physician;Educate patient on signs and symptoms of hypo- and hyperglycemia;Instruct patient to monitor blood sugar before and after exercise;Referral to Diabetes Education Educate patient on compliance with home blood sugar monitoring as prescribed by physician;Educate patient on signs and symptoms of hypo- and hyperglycemia;Instruct patient to monitor blood sugar before and after exercise;Referral to Diabetes Education Educate patient on compliance with home blood sugar monitoring as prescribed by physician;Educate patient on signs and symptoms of hypo- and hyperglycemia;Instruct patient  to monitor blood sugar before and after exercise;Referral to Diabetes Education Educate patient on compliance with home blood sugar monitoring as prescribed by physician;Educate patient on signs and symptoms of hypo- and hyperglycemia;Instruct patient to monitor blood sugar before and after exercise;Referral to Diabetes Education Educate patient on compliance with home blood sugar monitoring as prescribed by physician;Educate patient on signs and symptoms of hypo- and hyperglycemia;Instruct patient to monitor blood sugar before and after exercise;Referral to Diabetes Education Educate patient on compliance with home blood sugar monitoring as prescribed by physician;Educate patient on signs and symptoms of hypo- and hyperglycemia;Instruct patient to monitor blood sugar before and after exercise;Referral to Diabetes Education   HbA1c 11.5 11.5 11.5 11.5 11.5 11.5 11.5   Blood sugar 283 283 283 [phone] [id number]   Check BS at home? Yes Yes Yes Yes Yes Yes Yes   Progress towards goal In progress;Verbalizes understanding In progress;Verbalizes understanding In progress;Verbalizes understanding In progress;Verbalizes understanding In progress;No In progress;No In progress;No       HEART FAILURE:  HEART FAILURE 01/20/2020 01/31/2020 02/14/2020 03/13/2020 04/03/2020 06/27/2020 07/26/2020   Patient goal improve energy level improve energy level improve energy level improve energy level improve energy level improve energy level improve energy level   Other Goals Patient will document daily weight;Improve activity level and ability for self-care Patient will document daily weight;Improve activity level and ability for self-care Patient will document daily weight;Improve activity level and ability for self-care Patient will document daily weight;Improve activity level and ability for self-care Patient will document daily weight;Improve activity level and ability for self-care Patient will document daily weight;Improve activity level and ability for self-care Patient will document daily weight;Improve activity level and ability for self-care   Interventions Educate the patient on importance of daily weight documentation;Educate the patient the role of diuretics and rationale for compliance and monitor for side effects;Encourage adequate fluid intake within fluid restrictions as ordered by MD;Educate on low sodium foods and beverages and the importance of compliance with all ordered diet and fluid restrictions;Monitor and educate for signs/symptoms of volume overload such as edema, increased weight, and/or shortness of breath Educate the patient on importance of daily weight documentation;Educate the patient the role of diuretics and rationale for compliance and monitor for side effects;Encourage adequate fluid intake within fluid restrictions as ordered by MD;Educate on low sodium foods and beverages and the importance of compliance with all ordered diet and fluid restrictions;Monitor and educate for signs/symptoms of volume overload such as edema, increased weight, and/or shortness of breath Educate the patient on importance of daily weight documentation;Educate the patient the role of diuretics and rationale for compliance and monitor for side effects;Encourage adequate fluid intake within fluid restrictions as ordered by MD;Educate on low sodium foods and beverages and the importance of compliance with all ordered diet and fluid restrictions;Monitor and educate for signs/symptoms of volume overload such as edema, increased weight, and/or shortness of breath Educate the patient on importance of daily weight documentation;Educate the patient the role of diuretics and rationale for compliance and monitor for side effects;Encourage adequate fluid intake within fluid restrictions as ordered by MD;Educate on low sodium foods and beverages and the importance of compliance with all ordered diet and fluid restrictions;Monitor and educate for signs/symptoms of volume overload such as edema, increased weight, and/or shortness of breath Educate the patient on importance of daily weight documentation;Educate the patient the role of diuretics and rationale for compliance and monitor for side effects;Encourage adequate fluid intake within fluid restrictions as  ordered by MD;Educate on low sodium foods and beverages and the importance of compliance with all ordered diet and fluid restrictions;Monitor and educate for signs/symptoms of volume overload such as edema, increased weight, and/or shortness of breath Educate the patient on importance of daily weight documentation;Educate the patient the role of diuretics and rationale for compliance and monitor for side effects;Encourage adequate fluid intake within fluid restrictions as ordered by MD;Educate on low sodium foods and beverages and the importance of compliance with all ordered diet and fluid restrictions;Monitor and educate for signs/symptoms of volume overload such as edema, increased weight, and/or shortness of breath Educate the patient on importance of daily weight documentation;Educate the patient the role of diuretics and rationale for compliance and monitor for side effects;Encourage adequate fluid intake within fluid restrictions as ordered by MD;Educate on low sodium foods and beverages and the importance of compliance with all ordered diet and fluid restrictions;Monitor and educate for signs/symptoms of volume overload such as edema, increased weight, and/or shortness of breath   Type of Heart Failure Chronic diastolic (congestive) heart failure Chronic diastolic (congestive) heart failure Chronic diastolic (congestive) heart failure Chronic diastolic (congestive) heart failure Chronic diastolic (congestive) heart failure Chronic diastolic (congestive) heart failure Chronic diastolic (congestive) heart failure   EF % 55 55 55 55 55 55 55   NYHA Class 1 1 1 1 1 1 1    Progress towards goal In progress;Verbalizes understanding In progress;Verbalizes understanding In progress;Verbalizes understanding In progress;Verbalizes understanding No No No       MEDICATION COMPREHENSION:  MEDICATION COMPREHENSION 01/20/2020 01/31/2020 02/14/2020 03/13/2020 04/03/2020 06/27/2020 07/26/2020   Other Goals Patient will demonstrate knowledge of importance of taking meds as evidenced by verbalizing understanding of class, dosage, frequency and side effects. Patient will demonstrate knowledge of importance of taking meds as evidenced by verbalizing understanding of class, dosage, frequency and side effects. Patient will demonstrate knowledge of importance of taking meds as evidenced by verbalizing understanding of class, dosage, frequency and side effects. Patient will demonstrate knowledge of importance of taking meds as evidenced by verbalizing understanding of class, dosage, frequency and side effects. Patient will demonstrate knowledge of importance of taking meds as evidenced by verbalizing understanding of class, dosage, frequency and side effects. Patient will demonstrate knowledge of importance of taking meds as evidenced by verbalizing understanding of class, dosage, frequency and side effects. Patient will demonstrate knowledge of importance of taking meds as evidenced by verbalizing understanding of class, dosage, frequency and side effects.   Interventions Educate the patient on prescription and importance of time of day taken;Educate the patient on the class of medication, action and side effects of medication;Identify reasons for poor compliance with meds and discuss alternative options;Review dosage and frequency of medication with the patient Educate the patient on prescription and importance of time of day taken;Educate the patient on the class of medication, action and side effects of medication;Identify reasons for poor compliance with meds and discuss alternative options;Review dosage and frequency of medication with the patient Educate the patient on prescription and importance of time of day taken;Educate the patient on the class of medication, action and side effects of medication;Identify reasons for poor compliance with meds and discuss alternative options;Review dosage and frequency of medication with the patient Educate the patient on prescription and importance of time of day taken;Educate the patient on the class of medication, action and side effects of medication;Identify reasons for poor compliance with meds and discuss alternative options;Review dosage and frequency of medication with the patient  Educate the patient on prescription and importance of time of day taken;Educate the patient on the class of medication, action and side effects of medication;Identify reasons for poor compliance with meds and discuss alternative options;Review dosage and frequency of medication with the patient Educate the patient on prescription and importance of time of day taken;Educate the patient on the class of medication, action and side effects of medication;Identify reasons for poor compliance with meds and discuss alternative options;Review dosage and frequency of medication with the patient Educate the patient on prescription and importance of time of day taken;Educate the patient on the class of medication, action and side effects of medication;Identify reasons for poor compliance with meds and discuss alternative options;Review dosage and frequency of medication with the patient   Medications Understood Yes Yes Yes Yes Yes Yes Yes   Meds taken % of time 95 95 95 95 95 95 95   Progress towards goal In progress;Verbalizes understanding In progress;Verbalizes understanding In progress;Verbalizes understanding In progress;Verbalizes understanding In progress In progress In progress       TOBACCO CESSATION:  TOBACCO CESSATION 01/20/2020 01/31/2020 02/14/2020 03/13/2020 04/03/2020 06/27/2020 07/26/2020   Patient goal never smoker never smoker never smoker never smoker never smoker never smoker never smoker       Social History     Tobacco Use   Smoking Status Current Some Day Smoker   ??? Types: Cigars   Smokeless Tobacco Never Used   Tobacco Comment    Black milds       OTHER CORE COMPONENTS COMMENTS:  OTHER COMMENTS 01/20/2020 01/31/2020 02/14/2020 03/13/2020 04/03/2020 06/27/2020 07/26/2020   Other comments Pamela Gutierrez came in today for her evaluation for CARDIAC REHAB. She was oriented to CARDIAC REHAB and the [facility]. We discussed signs and symptoms that he should report while in CARDIAC REHAB. We also reviewed the precautions we are taking to prevent the spread of Covid here at the [facility]. Demita tested positive for covid 11 days ago and she is asymptomatic, and has quarantined for 10 days. She uses a walker because she has foot drop bilaterally. She has a high A1C and she checks her blood sugar 3-4 times per day. She states that she takes her meds as prescribed 95% of the time. Her goal for CARDIAC REHAB is to get her energy back. She carries her NTG with her, and has only needed to use it once. Pamela Gutierrez will join the 8:15 class on 01/28/20. Pamela Gutierrez came in today for her evaluation for CARDIAC REHAB. She was oriented to CARDIAC REHAB and the [facility]. We discussed signs and symptoms that he should report while in CARDIAC REHAB. We also reviewed the precautions we are taking to prevent the spread of Covid here at the [facility]. Alyria tested positive for covid 11 days ago and she is asymptomatic, and has quarantined for 10 days. She uses a walker because she has foot drop bilaterally. She has a high A1C and she checks her blood sugar 3-4 times per day. She states that she takes her meds as prescribed 95% of the time. Her goal for CARDIAC REHAB is to get her energy back. She carries her NTG with her, and has only needed to use it once. Pamela Gutierrez will join the 8:15 class on 01/28/20. Pamela Gutierrez came in today for her evaluation for CARDIAC REHAB. She was oriented to CARDIAC REHAB and the [facility]. We discussed signs and symptoms that he should report while in CARDIAC REHAB. We also reviewed the precautions we  are taking to prevent the spread of Covid here at the [facility]. Denaisha tested positive for covid 11 days ago and she is asymptomatic, and has quarantined for 10 days. She uses a walker because she has foot drop bilaterally. She has a high A1C and she checks her blood sugar 3-4 times per day. She states that she takes her meds as prescribed 95% of the time. Her goal for CARDIAC REHAB is to get her energy back. She carries her NTG with her, and has only needed to use it once. Pamela Gutierrez will join the 8:15 class on 01/28/20. Pamela Gutierrez is progressing in CR. She has missed several sessions due to GI problems. Her BP is WNL. She manages her blood sugar with diet and medication and exercise. She weighs daily. she states that she takes her meds as prescribed. She receives education modules via MyChart. Pamela Gutierrez has missed several sessions due to other health issues. we will discharge her now, and will he happy to have her return when her other issues allow. Pamela Gutierrez has missed several sessions due to other health issues. we will discharge her now, and will he happy to have her return when her other issues allow. Pamela Gutierrez is making good progress in CR. Her BP is WNL. She manages her blood sugar with diet, exercise and medications. She states that she takes her meds as prescribed. She weighs daily.       OTHER CORE COMPONENTS EDUCATION CLASSES:  OTHER CORE COMPONENT EDUCATION 03/02/2020 07/11/2020   Core Component Ed Medications Heart Anatomy   Attend Date 03/03/2020 07/11/2020   Other Comments [facility] Chart Mychart           Signed:  Marylene Land, RN  07/26/2020 9:27 AM

## 2020-07-26 NOTE — Unmapped (Signed)
I reviewed the individualized treatment plan as outlined by the Cardiac Staff on 07/26/2020 and I agree with the note outlining the plan.    Jeannie Done, MD

## 2020-07-28 ENCOUNTER — Encounter
Admit: 2020-07-28 | Payer: PRIVATE HEALTH INSURANCE | Attending: Cardiovascular Disease | Primary: Cardiovascular Disease

## 2020-07-28 ENCOUNTER — Encounter
Admit: 2020-07-28 | Payer: PRIVATE HEALTH INSURANCE | Attending: Student in an Organized Health Care Education/Training Program | Primary: Student in an Organized Health Care Education/Training Program

## 2020-07-28 ENCOUNTER — Encounter: Admit: 2020-07-28 | Payer: PRIVATE HEALTH INSURANCE

## 2020-07-28 ENCOUNTER — Institutional Professional Consult (permissible substitution)
Admit: 2020-07-28 | Discharge: 2020-08-26 | Payer: PRIVATE HEALTH INSURANCE | Attending: Cardiovascular Disease | Primary: Cardiovascular Disease

## 2020-07-28 ENCOUNTER — Institutional Professional Consult (permissible substitution): Admit: 2020-07-28 | Discharge: 2020-08-26 | Payer: PRIVATE HEALTH INSURANCE

## 2020-07-28 ENCOUNTER — Institutional Professional Consult (permissible substitution): Admit: 2020-07-28 | Payer: PRIVATE HEALTH INSURANCE

## 2020-07-28 ENCOUNTER — Encounter: Admit: 2020-07-28 | Discharge: 2020-07-29 | Payer: PRIVATE HEALTH INSURANCE

## 2020-07-28 NOTE — Unmapped (Signed)
Orthopaedic Foot and Ankle Division  Encounter Provider: Ancil Linsey, NP  Date of Service: 07/28/2020 Last encounter Orthopaedics: Visit date not found   Last encounter this provider: Visit date not found            Primary Care Provider: Sherol Dade, MD  Referring Provider: Angelia Mould    ICD-10-CM   1. Foot drop, bilateral  M21.371    M21.372   2. Type 2 diabetes mellitus with hyperglycemia, with long-term current use of insulin (CMS-HCC)  E11.65    Z79.4   3. Polyneuropathy associated with underlying disease (CMS-HCC)  G63    Orthopaedic notes: No specialty comments available.    Physical Function CAT Score: 32.2 (07/28/20)  Pain Interference CAT Score: 66 (07/28/20)  Depression CAT Score: (not recorded)  Sleep CAT Score: (not recorded)  [email or web address]     Pamela Gutierrez is a 30 y.o. female   ASSESSMENT     Uncontrolled diabetes with peripheral neuropathy, bilateral foot drop        PLAN:     -Referral for AFOs and diabetic inserts  -May continue use night splint, discussed avoiding equinus contracture  -Continue efforts at medical optimization  -Follow-up 2 months with no imaging prior to exam    Requested Prescriptions      No prescriptions requested or ordered in this encounter      Orders Placed This Encounter   Procedures   ??? Ambulatory referral for Orthotics       History:  (Data reviewed and verified by encounter provider.)  Chief Complaint   Patient presents with   ??? Ankle Pain     Left ankle pain and instability     HPI:  30 y.o. female who presents due to bilateral ankle pain and foot drop.  Patient reports that she was pulled by dog in July 2020 resulting in injury to her knee and ankle.  She developed foot drop by December 2020.  She underwent peroneal nerve decompression in March 2021.  Her sensation improved some but her strength has not improved.  In September 2021 she injured her right leg with subsequent right foot drop.  Patient is followed by neurology at [facility].  She is working to get her diabetes under better control and is plugged in with endocrinology, primary care, and cardiology.  She is status post MI.  She has AFOs but does not wear them because they hurt and do not support her ankles.  0-10 Pain Scale: 3    Medical History Past Medical History:   Diagnosis Date   ??? Allergic    ??? Anemia    ??? Asthma    ??? Eczema    ??? Gastroparesis    ??? GERD (gastroesophageal reflux disease)    ??? Hyperlipidemia    ??? Hypertension    ??? Murmur, cardiac    ??? Obesity    ??? 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 Type 2 diabetes mellitus (CMS-HCC)     BS- 90-130      Surgical History Past Surgical History:   Procedure Laterality Date   ??? ABLATION OF DYSRHYTHMIC FOCUS     ??? CARDIAC CATHETERIZATION     ??? CHOLECYSTECTOMY  2015   ??? CORONARY STENT PLACEMENT     ??? ELBOW FRACTURE SURGERY     ??? PR CATH PLACE/CORON ANGIO, IMG SUPER/INTERP,W LEFT HEART VENTRICULOGRAPHY N/A 11/12/2019    Procedure: Left Heart Catheterization;  Surgeon: Greggory Stallion  Samuel Bouche, MD;  Location: [facility] CATH;  Service: Cardiology   ??? PR COMPRE EP EVAL ABLTJ 3D MAPG TX SVT N/A 06/04/2019    Procedure: Accessory Pathway Ablation;  Surgeon: Joretta Bachelor, MD;  Location: [facility] EP;  Service: Cardiology   ??? PR ELECTROPHYS EV,R A-V PACE/REC,W/O INDUCT N/A 06/04/2019    Procedure: Comprehensive Study W IND;  Surgeon: Joretta Bachelor, MD;  Location: [facility] EP;  Service: Cardiology   ??? PR EXC SWEAT GLAND LESN AXILL,SIMPL Right 10/30/2015    Procedure: RIGHT AXILLAE EXCISE HIDRADENITIS;  Surgeon: Oren Section, MD;  Location: [facility] OR HPR;  Service: Plastics   ??? PR NEGATIVE PRESSURE WOUND THERAPY DME </= 50 SQ CM N/A 10/30/2015    Procedure: WOUND VAC PLACEMENT;  Surgeon: Oren Section, MD;  Location: [facility] OR [facility];  Service: Plastics   ??? PR SPLIT GRFT TRUNK,ARM,LEG <100 SQCM N/A 10/30/2015    Procedure: SPLIT-THICKNESS SKIN GRAFT;  Surgeon: Oren Section, MD;  Location: [facility];  Service: Plastics      Allergies Lanolin, Morphine (pf), Tomato, Venom-honey bee, Wool, Ibuprofen, Nsaids (non-steroidal anti-inflammatory drug), Iodinated contrast media, and Ioxaglate sodium   Medications She has a current medication list which includes the following prescription(s): acetaminophen, acyclovir, albuterol, amlodipine, amoxicillin-clavulanate, aspirin, atorvastatin, belsomra, glucose blood, blood-glucose meter, cetirizine, clindamycin, clobetasol, cyclobenzaprine, empty container, repatha syringe, ezetimibe, ferrous sulfate, flash glucose scanning reader, flash glucose sensor, furosemide, gabapentin, hydrocodone-acetaminophen, hydroxyzine, insulin aspart, tresiba flextouch u-100, pen needle, diabetic, insulin syringe-needle u-100, insulin syringe-needle u-100, losartan, metformin, metoclopramide, metoprolol succinate, montelukast, nitroglycerin, norlyda, omeprazole, ondansetron, prasugrel, promethazine, ozempic, sertraline, simethicone, spironolactone, and triamcinolone.   Family History Her family history includes Diabetes in her father, maternal grandmother, and mother; Heart disease in her maternal grandmother and paternal grandmother.   Social History She reports that she has been smoking cigars. She has never used smokeless tobacco. She reports current alcohol use. She reports current drug use. Drug: Marijuana.Home address:[id number]  [place] [address]  Occupation:         Occupational History   ??? Not on file     Social History     Social History Narrative   ??? Not on file            Exam:  The primary encounter diagnosis was Foot drop, bilateral. Diagnoses of Type 2 diabetes mellitus with hyperglycemia, with long-term current use of insulin (CMS-HCC) and Polyneuropathy associated with underlying disease (CMS-HCC) were also pertinent to this visit.   Estimated body mass index is 26.24 kg/m?? as calculated from the following:    Height as of 07/14/20: 166.4 cm (5' 5.5).    Weight as of an earlier encounter on 07/28/20: 72.6 kg (160 lb 1.6 oz).     Musculoskeletal   ??? Nontender over the feet and ankles.  Patient does not have active dorsiflexion at the ankle.  Passive dorsiflexion at the ankle with the knee extended to 10 degrees.  Skin is intact.  There are preulcerative calluses at the heel bilaterally.       Standing alignment moderate planovalgus    Pulses well-perfused distally, 2+ DP and posterior tibial pulses    Swelling no swelling    Neurologic Sensation to light touch distally diminished no sensation to monofilament       Test Results  The primary encounter diagnosis was Foot drop, bilateral. Diagnoses of Type 2 diabetes mellitus with hyperglycemia, with long-term current use of insulin (CMS-HCC) and Polyneuropathy associated with  underlying disease (CMS-HCC) were also pertinent to this visit.  Lab Results   Component Value Date    A1C 11.4 (H) 07/21/2020       No results found for: VITD    Imaging  Orders Placed This Encounter   Procedures   ??? Ambulatory referral for Orthotics     Previous imaging was personally reviewed and interpreted by the encounter provider today..  Joint spaces preserved.  Planovalgus alignment

## 2020-07-31 ENCOUNTER — Encounter: Admit: 2020-07-31 | Discharge: 2020-08-01 | Payer: PRIVATE HEALTH INSURANCE

## 2020-07-31 DIAGNOSIS — Z794 Long term (current) use of insulin: Principal | ICD-10-CM

## 2020-07-31 DIAGNOSIS — E1165 Type 2 diabetes mellitus with hyperglycemia: Principal | ICD-10-CM

## 2020-07-31 DIAGNOSIS — E7800 Pure hypercholesterolemia, unspecified: Principal | ICD-10-CM

## 2020-07-31 DIAGNOSIS — I2111 ST elevation (STEMI) myocardial infarction involving right coronary artery: Principal | ICD-10-CM

## 2020-07-31 DIAGNOSIS — F331 Major depressive disorder, recurrent, moderate: Principal | ICD-10-CM

## 2020-07-31 DIAGNOSIS — I10 Essential (primary) hypertension: Principal | ICD-10-CM

## 2020-07-31 NOTE — Unmapped (Signed)
Thanks for choosing [facility] at [place]  for your medical care!    If you have any questions about your visit today, please call us at [phone].      For medication refills, please have your pharmacist send an electronic refill request    If you need care after 5:00 pm during the week or on the weekend:  Call [facility] at [phone] for nurse/physician advice or...    Go to  Urgent Care walk-in clinic [address], [address], [place] [phone] -- Open 7 days a week from 9:00AM - 8:00PM        We will always try to notify you of the results from laboratory tests within ten days of the study.  If you do not hear from us by phone, letter, or electronic message, please call the office immediately for further information.

## 2020-07-31 NOTE — Unmapped (Signed)
 INTERNAL MEDICINE OUTPATIENT NOTE        ASSESSMENT   Diagnosis ICD-10-CM Associated Orders   1. Primary hypertension  I10    2. Pure hypercholesterolemia  E78.00    3. Type 2 diabetes mellitus with hyperglycemia, with long-term current use of insulin (CMS-HCC)  E11.65     Z79.4    4. ST elevation myocardial infarction involving right coronary artery (CMS-HCC)  I21.11    5. Moderate episode of recurrent major depressive disorder (CMS-HCC)  F33.1          PLAN  1. Hypertension usually well controlled.  Blood pressure high today.  She will monitor at home and at cardiac rehab and let me know if readings do not come back to normal range. Continue current medications.  2. Last LDL was not at goal June 2022 (137). She was recently started on repatha. Continue atorvastatin.  Will talk with cardiology about potentially stopping Zetia.  3. Diabetes is not at goal. She follows closely with endocrinology. Continue metformin 500 mg twice daily, Tresiba 36 unites daily, aspart 6 units three times daily, and Ozempic 0.5 mg daily.  4. No recent chest pain.  She is doing well with cardiac rehab and has noticed an improvement in her endurance and energy level.   5. Depression is at goal. Continue sertraline and regular counseling.      Preventive services addressed today  Eye care: outside records requested    -- Patient verbalized an understanding of today's assessment and recommendations, as well as the purpose of ongoing medications.    Medication adherence and barriers to the treatment plan have been addressed. Opportunities to optimize healthy behaviors have been discussed. Patient / caregiver voiced understanding.    Return in about 2 months (around 10/10/2020).      Reason for Visit:   Chief Complaint   Patient presents with   ??? Follow-up         History of Present Illness:    This is a 30 y.o. year old female with an extensive medical history who presents for follow-up of ongoing medical issues. She is enjoying her job at [employer] and has been able to work without significant difficulty. Patient still has daily nausea and weekly vomiting, but symptoms are improved. She has had bilateral foot drop since December 2021. Patient has an appointment with Orthopedics on 7/29 to pursue braces for foot drop.     Hypertension: Continues losartan 25 mg once daily, amlodipine 10 mg once daily, Toprol-XL 100 mg once daily and spironolactone 100 mg once daily. Her blood pressure today is 152/98. She did have caffeine today. Patient reports that her blood pressure was within recommended ranges this morning and has been good at cardiac rehab.     Hyperlipidemia: Recently started rosuvastatin and discontinued atorvastatin 80 mg as advised by endocrinology on 06/21/20. Patient did not remember rosuvastatin dose. She has been tolerating the injections well. Continues Zetia 10 mg daily. No reported myalgia.  Most recent labs show:  Lab Results   Component Value Date    LDL Direct 137.6 06/21/2020    HDL 24 (L) 02/21/2020    Triglycerides 65 11/12/2019     Diabetes: Follows with [facility]. Continues metformin 500 mg twice daily, Tresiba 36 unites daily, and aspart 6 units three times daily. She recently discontinued Ozempic 0.5 mg daily because it aggrevated her gastroparesis. No significant hyper- or hypoglycemia events reported. Last A1c was 11.4 % on 07/21/20. Her home  blood pressure readings are in the 200s. Periodic interventions are up-to-date for foot and eye exams. She tolerates chicken, fish, vegetables and fruits well. Patient enjoys breads and pasta but is trying to decrease consumption of starches.    Coronary artery disease: T elevation MI in November 2021.  She is status post PCI to the RCA. Continues prasugrel 10 mg daily and aspirin 81 mg daily, as well as cardiac rehabilitation. Her energy level and endurance has increased since starting cardiac rehabilitation. No chest pain. Patient has been wearing a loop recorder to record potential arhythmias. She has only triggered it once when she had shortness of breath and increased pulse rate (loop recorder revealed sinus tachycardia). No other shortness of breath.    Depression: Her mood is stable on sertraline 75 mg daily and belsomra 10 mg daily. She notes occasional agitation and anger. Continues with therapist.    REVIEW OF SYSTEMS: A comprehensive review of systems was conducted and is negative except for the above.           Medications:  Current Outpatient Medications   Medication Sig Dispense Refill   ??? acetaminophen (TYLENOL 8 HOUR) 650 MG CR tablet Take 650 mg by mouth daily.     ??? acyclovir (ZOVIRAX) 400 MG tablet TAKE 1 TABLET(400 MG) BY MOUTH THREE TIMES DAILY 90 tablet 0   ??? albuterol HFA 90 mcg/actuation inhaler Inhale 2 puffs every four (4) hours as needed.      ??? amoxicillin-clavulanate (AUGMENTIN) 875-125 mg per tablet Take twice daily 60 tablet 2   ??? aspirin 81 MG chewable tablet Chew 1 tablet (81 mg total) daily. 30 tablet 11   ??? BELSOMRA 10 mg tablet TAKE 1 TABLET(10 MG) BY MOUTH EVERY NIGHT 90 tablet 1   ??? blood sugar diagnostic (GLUCOSE BLOOD) Strp Test once daily and as directed.one touch verio flex testing strips 100 strip 3   ??? cetirizine 10 mg cap Take 10 mg by mouth daily as needed.      ??? clindamycin (CLEOCIN T) 1 % lotion Once daily to body areas that are typically affected 60 mL 11   ??? clobetasoL (TEMOVATE) 0.05 % ointment Apply topically Two (2) times a day. As needed for flares up to a week at a time 60 g 3   ??? cyclobenzaprine (FLEXERIL) 10 MG tablet TAKE 1 TABLET(10 MG) BY MOUTH EVERY NIGHT 90 tablet 0   ??? empty container (SHARPS-A-GATOR DISPOSAL SYSTEM) Misc Use as directed for sharps disposal 1 each 2   ??? evolocumab (REPATHA SYRINGE) 140 mg/mL Syrg Inject the contents of one pen (140 mg) under the skin every fourteen (14) days. 6 mL 3   ??? ezetimibe (ZETIA) 10 mg tablet Take 1 tablet (10 mg total) by mouth daily. To help lower cholesterol 90 tablet 3   ??? ferrous sulfate 325 (65 FE) MG tablet Take by mouth. Once daily PRN     ??? flash glucose scanning reader (FLASH GLUCOSE SCANNING READER) by Other route Take as directed. Dispense Libre 2 scanning reader, use as directed 1 each 2   ??? flash glucose sensor (FLASH GLUCOSE SENSOR) kit by Other route every fourteen (14) days. Dispense Libre 2 sensors, either 1 mo supply or 3 mo supply, as per patient preference; change q 14 days 6 each 0   ??? furosemide (LASIX) 40 MG tablet TAKE 1 TABLET BY MOUTH DAILY AS NEEDED FOR> 2 POUND WEIGHT GAIN IN A 24 HOUR PERIOD 90 tablet 1   ??? gabapentin (  NEURONTIN) 300 MG capsule Take 300 mg (1 capsule) in the morning, 300 mg at noon, and 600 mg (2 capsules) in the evening. 360 capsule 1   ??? HYDROcodone-acetaminophen (NORCO) 5-325 mg per tablet Take 1 tablet every 6 hours as needed for pain 15 tablet 0   ??? hydrOXYzine (ATARAX) 25 MG tablet TAKE 1 TABLET(25 MG) BY MOUTH EVERY 8 HOURS AS NEEDED 90 tablet 1   ??? insulin ASPART (NOVOLOG FLEXPEN) 100 unit/mL (3 mL) injection pen Inject 0.06 mL (6 Units total) under the skin Three (3) times a day before meals. Take 5 units three times per day with meals (2 units with snacks/small meals), plus 2 extra for every 50 points over 150. Up to 50 units per day. 15 mL 12   ??? insulin degludec (TRESIBA FLEXTOUCH U-100) 100 unit/mL (3 mL) InPn 36 units per day. 21 mL 11   ??? insulin needles, disposable, (BD ULTRA-FINE NANO PEN NEEDLES) 32 x 5/32  Ndle Inject 1 each under the skin Five (5) times a day. Use 5x/day with Lantus, Novolog, and Victoza pens. 200 each 12   ??? insulin syringe-needle U-100 (BD INSULIN SYRINGE ULT-FINE II) 0.5 mL 31 gauge x 5/16 Syrg ok to sub any brand or size insulin syringe preferred by insurance/patient, use 5x/day, dx E11.65 200 each 5   ??? insulin syringe-needle U-100 0.3 mL 31 x 3/8 Syrg For use with Humulin R AC TID. 300 Syringe 3   ??? losartan (COZAAR) 25 MG tablet TAKE 1 TABLET(25 MG) BY MOUTH DAILY 90 tablet 3   ??? metFORMIN (GLUCOPHAGE-XR) 500 MG 24 hr tablet Take 1 tablet (500 mg total) by mouth two (2) times a day. 180 tablet 1   ??? metoclopramide (REGLAN) 10 MG tablet Take 1 tablet (10 mg total) by mouth four (4) times a day as needed. Take 1 tablet 4 times daily as neeed 120 tablet 3   ??? metoprolol succinate (TOPROL-XL) 100 MG 24 hr tablet Take 1 tablet (100 mg total) by mouth every morning. 90 tablet 1   ??? montelukast (SINGULAIR) 10 mg tablet Take 10 mg by mouth daily as needed.      ??? nitroglycerin (NITROSTAT) 0.4 MG SL tablet Place 1 tablet (0.4 mg total) under the tongue every five (5) minutes as needed for chest pain. Maximum of 3 doses in 15 minutes. 25 tablet 0   ??? NORLYDA 0.35 mg tablet TAKE 1 TABLET(0.35 MG) BY MOUTH DAILY 84 tablet 3   ??? omeprazole (PRILOSEC) 20 MG capsule Take 20 mg by mouth daily.     ??? ondansetron (ZOFRAN-ODT) 4 MG disintegrating tablet Take 1 tablet (4 mg total) by mouth every eight (8) hours as needed for nausea. 60 tablet 3   ??? prasugreL (EFFIENT) 10 mg tablet Take 1 tablet (10 mg total) by mouth daily. 30 tablet 11   ??? promethazine (PHENERGAN) 25 MG tablet TAKE 1 TABLET(25 MG) BY MOUTH EVERY 6 HOURS AS NEEDED FOR NAUSEA 60 tablet 3   ??? sertraline (ZOLOFT) 50 MG tablet Take 1.5 tablets (75 mg total) by mouth daily. 135 tablet 3   ??? simethicone 125 mg cap Take 120 mg by mouth two (2) times a day as needed.     ??? triamcinolone (KENALOG) 0.1 % cream Apply topically once as needed.      ??? amLODIPine (NORVASC) 10 MG tablet Take 1 tablet (10 mg total) by mouth daily. 90 tablet 3   ??? blood-glucose meter kit Currently has One Touch Ultra  test strips. Please issue per her formulary. 1 each 1   ??? spironolactone (ALDACTONE) 100 MG tablet Take 1 tablet (100 mg total) by mouth daily. 90 tablet 1     No current facility-administered medications for this visit.            PHYSICAL EXAM:  BP 152/98 (BP Site: L Arm, BP Position: Sitting, BP Cuff Size: Medium)  - Pulse 89  - Temp 37.1 ??C (98.7 ??F) (Oral)  - Ht 166.4 cm (5' 5.5)  - Wt 76.9 kg (169 lb 8 oz)  - SpO2 99%  - BMI 27.78 kg/m??     GENERAL:  This is a pleasant female in no distress.  The patient maintains good eye contact, good judgment, fluent speech, normal affect.  EYES:  Pupils are equal, round and reactive to light.  Anicteric sclerae.  ENT:  Tympanic membranes are clear bilaterally.  Oropharynx is moist, no lesions, good dentition.   NECK:  Supple, no lymphadenopathy.  THYROID: No enlargement or nodules.   LUNGS:  Relaxed respiratory effort.  Clear to auscultation bilaterally.   CARDIOVASCULAR:  Regular rate and rhythm, no murmurs.   ABDOMEN:  Normal bowel sounds, soft, nontender, nondistended, no masses or organomegaly. Marland Kitchen  SKIN: Appropriately warm and moist.  No concerning rashes or lesions.   NEURO: Stable gait and coordination   EXTREMITIES:  Trace pitting edema in left lower extremity.    BP Readings from Last 3 Encounters:   07/31/20 152/98   07/31/20 120/80   07/28/20 120/70      Wt Readings from Last 3 Encounters:   07/31/20 76.9 kg (169 lb 8 oz)   07/31/20 72.9 kg (160 lb 11.2 oz)   07/28/20 72.6 kg (160 lb 1.6 oz)          Note - This record has been created using [employer]. Chart creation errors have been sought, but may not always have been located. Such creation errors do not reflect on the standard of medical care.    I attest that I, Maurice Small, personally documented this note while acting as scribe for Sherol Dade, MD.      Maurice Small, Scribe.  07/31/2020     The documentation recorded by the scribe accurately reflects the service I personally performed and the decisions made by me.    Sherol Dade, MD

## 2020-08-04 ENCOUNTER — Encounter
Admit: 2020-08-04 | Discharge: 2020-08-05 | Payer: PRIVATE HEALTH INSURANCE | Attending: Rehabilitative and Restorative Service Providers" | Primary: Rehabilitative and Restorative Service Providers"

## 2020-08-04 NOTE — Unmapped (Signed)
Lower Extremity Initial Evaluation      HPI   SUBJECTIVE:   Pamela Gutierrez is a 30 y.o. female who presents for evaluation for bilateral AFOs. She was seen and referred by Pamela Gutierrez. Pamela Gutierrez has complex medical history. She suffered a LLE injury in the summer of 2020, initially believed to be a sprain but suffered peroneal nerve injury leading the LLE weakness. She suffered another injury to the RLE in the fall of 2021 also resulting in foot drop. She has had an MI and she is diabetic.    She came to the clinic with a female companion, and used a rollator. She states she is dependent on the rollator or external ssistance.      History     Current Outpatient Medications on File Prior to Visit   Medication Sig Dispense Refill   ??? acetaminophen (TYLENOL 8 HOUR) 650 MG CR tablet Take 650 mg by mouth daily.     ??? acyclovir (ZOVIRAX) 400 MG tablet TAKE 1 TABLET(400 MG) BY MOUTH THREE TIMES DAILY 90 tablet 0   ??? albuterol HFA 90 mcg/actuation inhaler Inhale 2 puffs every four (4) hours as needed.      ??? amLODIPine (NORVASC) 10 MG tablet Take 1 tablet (10 mg total) by mouth daily. 90 tablet 3   ??? amoxicillin-clavulanate (AUGMENTIN) 875-125 mg per tablet Take twice daily 60 tablet 2   ??? aspirin 81 MG chewable tablet Chew 1 tablet (81 mg total) daily. 30 tablet 11   ??? BELSOMRA 10 mg tablet TAKE 1 TABLET(10 MG) BY MOUTH EVERY NIGHT 90 tablet 1   ??? blood sugar diagnostic (GLUCOSE BLOOD) Strp Test once daily and as directed.one touch verio flex testing strips 100 strip 3   ??? blood-glucose meter kit Currently has One Touch Ultra test strips. Please issue per her formulary. 1 each 1   ??? cetirizine 10 mg cap Take 10 mg by mouth daily as needed.      ??? clindamycin (CLEOCIN T) 1 % lotion Once daily to body areas that are typically affected 60 mL 11   ??? clobetasoL (TEMOVATE) 0.05 % ointment Apply topically Two (2) times a day. As needed for flares up to a week at a time 60 g 3   ??? cyclobenzaprine (FLEXERIL) 10 MG tablet TAKE 1 TABLET(10 MG) BY MOUTH EVERY NIGHT 90 tablet 0   ??? empty container (SHARPS-A-GATOR DISPOSAL SYSTEM) Misc Use as directed for sharps disposal 1 each 2   ??? evolocumab (REPATHA SYRINGE) 140 mg/mL Syrg Inject the contents of one pen (140 mg) under the skin every fourteen (14) days. 6 mL 3   ??? ezetimibe (ZETIA) 10 mg tablet Take 1 tablet (10 mg total) by mouth daily. To help lower cholesterol 90 tablet 3   ??? ferrous sulfate 325 (65 FE) MG tablet Take by mouth. Once daily PRN     ??? flash glucose scanning reader (FLASH GLUCOSE SCANNING READER) by Other route Take as directed. Dispense Libre 2 scanning reader, use as directed 1 each 2   ??? flash glucose sensor (FLASH GLUCOSE SENSOR) kit by Other route every fourteen (14) days. Dispense Libre 2 sensors, either 1 mo supply or 3 mo supply, as per patient preference; change q 14 days 6 each 0   ??? furosemide (LASIX) 40 MG tablet TAKE 1 TABLET BY MOUTH DAILY AS NEEDED FOR> 2 POUND WEIGHT GAIN IN A 24 HOUR PERIOD 90 tablet 1   ??? gabapentin (NEURONTIN) 300 MG capsule Take 300  mg (1 capsule) in the morning, 300 mg at noon, and 600 mg (2 capsules) in the evening. 360 capsule 1   ??? HYDROcodone-acetaminophen (NORCO) 5-325 mg per tablet Take 1 tablet every 6 hours as needed for pain 15 tablet 0   ??? hydrOXYzine (ATARAX) 25 MG tablet TAKE 1 TABLET(25 MG) BY MOUTH EVERY 8 HOURS AS NEEDED 90 tablet 1   ??? insulin ASPART (NOVOLOG FLEXPEN) 100 unit/mL (3 mL) injection pen Inject 0.06 mL (6 Units total) under the skin Three (3) times a day before meals. Take 5 units three times per day with meals (2 units with snacks/small meals), plus 2 extra for every 50 points over 150. Up to 50 units per day. 15 mL 12   ??? insulin degludec (TRESIBA FLEXTOUCH U-100) 100 unit/mL (3 mL) InPn 36 units per day. 21 mL 11   ??? insulin needles, disposable, (BD ULTRA-FINE NANO PEN NEEDLES) 32 x 5/32  Ndle Inject 1 each under the skin Five (5) times a day. Use 5x/day with Lantus, Novolog, and Victoza pens. 200 each 12   ??? insulin syringe-needle U-100 (BD INSULIN SYRINGE ULT-FINE II) 0.5 mL 31 gauge x 5/16 Syrg ok to sub any brand or size insulin syringe preferred by insurance/patient, use 5x/day, dx E11.65 200 each 5   ??? insulin syringe-needle U-100 0.3 mL 31 x 3/8 Syrg For use with Humulin R AC TID. 300 Syringe 3   ??? losartan (COZAAR) 25 MG tablet TAKE 1 TABLET(25 MG) BY MOUTH DAILY 90 tablet 3   ??? metFORMIN (GLUCOPHAGE-XR) 500 MG 24 hr tablet Take 1 tablet (500 mg total) by mouth two (2) times a day. 180 tablet 1   ??? metoclopramide (REGLAN) 10 MG tablet Take 1 tablet (10 mg total) by mouth four (4) times a day as needed. Take 1 tablet 4 times daily as neeed 120 tablet 3   ??? metoprolol succinate (TOPROL-XL) 100 MG 24 hr tablet Take 1 tablet (100 mg total) by mouth every morning. 90 tablet 1   ??? montelukast (SINGULAIR) 10 mg tablet Take 10 mg by mouth daily as needed.      ??? nitroglycerin (NITROSTAT) 0.4 MG SL tablet Place 1 tablet (0.4 mg total) under the tongue every five (5) minutes as needed for chest pain. Maximum of 3 doses in 15 minutes. 25 tablet 0   ??? NORLYDA 0.35 mg tablet TAKE 1 TABLET(0.35 MG) BY MOUTH DAILY 84 tablet 3   ??? omeprazole (PRILOSEC) 20 MG capsule Take 20 mg by mouth daily.     ??? ondansetron (ZOFRAN-ODT) 4 MG disintegrating tablet Take 1 tablet (4 mg total) by mouth every eight (8) hours as needed for nausea. 60 tablet 3   ??? prasugreL (EFFIENT) 10 mg tablet Take 1 tablet (10 mg total) by mouth daily. 30 tablet 11   ??? promethazine (PHENERGAN) 25 MG tablet TAKE 1 TABLET(25 MG) BY MOUTH EVERY 6 HOURS AS NEEDED FOR NAUSEA 60 tablet 3   ??? sertraline (ZOLOFT) 50 MG tablet Take 1.5 tablets (75 mg total) by mouth daily. 135 tablet 3   ??? simethicone 125 mg cap Take 120 mg by mouth two (2) times a day as needed.     ??? spironolactone (ALDACTONE) 100 MG tablet Take 1 tablet (100 mg total) by mouth daily. 90 tablet 1   ??? triamcinolone (KENALOG) 0.1 % cream Apply topically once as needed.        No current facility-administered medications on file prior to visit.  Past Medical History:   Diagnosis Date   ??? Allergic    ??? Anemia    ??? Asthma    ??? Eczema    ??? Gastroparesis    ??? GERD (gastroesophageal reflux disease)    ??? Hyperlipidemia    ??? Hypertension    ??? Murmur, cardiac    ??? Obesity    ??? 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 Type 2 diabetes mellitus (CMS-HCC)     BS- 90-130       Past Surgical History:   Procedure Laterality Date   ??? ABLATION OF DYSRHYTHMIC FOCUS     ??? CARDIAC CATHETERIZATION     ??? CHOLECYSTECTOMY  2015   ??? CORONARY STENT PLACEMENT     ??? ELBOW FRACTURE SURGERY     ??? PR CATH PLACE/CORON ANGIO, IMG SUPER/INTERP,W LEFT HEART VENTRICULOGRAPHY N/A 11/12/2019    Procedure: Left Heart Catheterization;  Surgeon: Alvira Philips, MD;  Location: [facility] CATH;  Service: Cardiology   ??? PR COMPRE EP EVAL ABLTJ 3D MAPG TX SVT N/A 06/04/2019    Procedure: Accessory Pathway Ablation;  Surgeon: Joretta Bachelor, MD;  Location: [facility] EP;  Service: Cardiology   ??? PR ELECTROPHYS EV,R A-V PACE/REC,W/O INDUCT N/A 06/04/2019    Procedure: Comprehensive Study W IND;  Surgeon: Joretta Bachelor, MD;  Location: [facility] EP;  Service: Cardiology   ??? PR EXC SWEAT GLAND LESN AXILL,SIMPL Right 10/30/2015    Procedure: RIGHT AXILLAE EXCISE HIDRADENITIS;  Surgeon: Oren Section, MD;  Location: [facility];  Service: Plastics   ??? PR NEGATIVE PRESSURE WOUND THERAPY DME </= 50 SQ CM N/A 10/30/2015    Procedure: WOUND VAC PLACEMENT;  Surgeon: Oren Section, MD;  Location: [facility];  Service: Plastics   ??? PR SPLIT GRFT TRUNK,ARM,LEG <100 SQCM N/A 10/30/2015    Procedure: SPLIT-THICKNESS SKIN GRAFT;  Surgeon: Oren Section, MD;  Location: [facility];  Service: Plastics       Family History   Problem Relation Age of Onset   ??? Diabetes Mother    ??? Diabetes Father    ??? Heart disease Maternal Grandmother    ??? Diabetes Maternal Grandmother    ??? Heart disease Paternal Grandmother    ??? Melanoma Neg Hx    ??? Basal cell carcinoma Neg Hx    ??? Squamous cell carcinoma Neg Hx        Social History     Tobacco Use   ??? Smoking status: Current Some Day Smoker     Types: Cigars   ??? Smokeless tobacco: Never Used   ??? Tobacco comment: Black milds   Substance Use Topics   ??? Alcohol use: Yes     Comment: occasional   ??? Drug use: Yes     Types: Marijuana       History of O&P Care:  Has used a Toe-off like AFO on the left. Has purchased an AFO from [place] on the R, possible has used a knee immobilizer of the R    Physical Exam       Appearance:  Good, no acute distress      Range of Motion:  WNL    MMT:  Grossly flaccid below the knee. She cannot actively DF. Unable to perform bilateral heel raises    Skin/Sensation/Edema:  Preulcerative callous bilaterally at the heels. Bruised L 5th. Swelling bilateraly, L>R  Some early claw toeing. Sensation is present but decreased  Gait:  Steppage gait with audible foot slap     Balance/Coordination: Unbalanced, requires the use of the rollator of ambulation    Goals     Control foot drop  Control tibial progression  Reduce steppage gait  Provide ML stability to the ankle  Wear brace daily  Remain free from falls      Assessment   Assessment: Pamela Gutierrez would benefit from bilateral solid AFOs. She has significant BLE weakness. A solid AFO provides the best support to ankle stability during gait. She would need a molded inner boot, given her presentation of preulceration and decreased sensation.    Prefabricated carbon AFO has not provided adequate ankle stability. An articulated AFO, or other carbon AFOs, or Xtern AFOs would not provided adequate sagittal control.    Counseled patient about brace recommendations, timelines, prescription, authorization process and follow up.  Patient and Caregiver expressed understanding    []  New Patient Info provided   []  Medicare supplier standards provided  Actions taken today     Assessment and Shape Capture: BLE cast impressions taken without incident     Brace Number:  2    Brace Design:    [x]  Custom Solid AFO  []  Spiral AFO  []  Dorsiflexion Assist AFO    []  Custom Articulated AFO []  Floor Reaction    []  Custom Leaf [place] AFO []  Prefabricated [place] AFO    Need for Custom: [x]  Yes []  No    Mark all that apply      Could not be fit with a pre-fabricated AFO [x]  Yes []  No    Has used prefabricated brace unsuccessfully in the past [x]  Yes []  No    Condition expected to be long-standing in nature [x]  Yes []  No    There is a need to control the knee, ankle or foot in more than one plane [x]  Yes []  No    Custom design is needed to prevent tissue injury [x]  Yes []  No    Patient has a healing fracture []  Yes [x]  No      Modifications expected to custom or prefabricated device   Size  []  routine footplate trimming   Alignment []     Other []       Brace Material:  []  3D Printed  [x]  Thermoplastic: Copolymer  [] Thermoset: Carbon Fiber  [] Other:      Any additions or modifications to the standard: molded inner boot. Heel posting      Plan    Return Visit:  Once fabricated, about 4 weeks     Authorization expectations:  Will verify     O&P Fabrication: Donalee [employer]rin     O&P Supplies: No      The follow Supplies are needed for next visit:      Item Part # Quantity

## 2020-08-04 NOTE — Unmapped (Signed)
Thank you for choosing [facility] for your prosthetic / orthotic needs.       Contact information:   The best method of contact is through [employer].  Using mychart you can message your clinical team at any time and receive direct communications back to you.     To schedule a visit or for after hours care over the phone please contact:    [phone]     Making P&O devices can take time.  Your clinician will try to provide you with our best estimate of how long the process should take.      In general the process follows these steps.   You meet with a clinician who recommends a device meant just for you   The clinician takes measurements or makes a detailed copy of your body to make your device   Our finance team works on getting permission from your insurer to make you a device.     Our finance team lets you know how much you would be expected to pay and you choose to move forward.   Your device is ordered and fabrication begins   Your device is completed and arrives at our location   You return to be fit with your device    Here are some approximate timelines for common devices  Device Expected timeline Visits Device Expected Timeline Visits   Scoliosis Brace 3 weeks 2 visits  +1 for in-brace x-ray Preparatory Prosthesis 2-3 weeks 2 visits   Cranial Remolding Orthosis 2 weeks 8 visits over 15 weeks Final Prosthesis 6 weeks 4-5 visits   AFO 2-3 weeks 2 visits Activity Specific Prosthesis 6-8 weeks 5-6 visits   Pre-made supplies, braces, etc 1 week 1-2 visits Aesthetic Restoration Prosthesis 8-12 weeks 2-3 visits     For some patients, it is not convenient to be seen in one of our locations.  If this is the case for you, please discuss it with your clinician who can coordinate your care with well trained clinical teams in your area.      We are committed to working to improve every day.  If you have feedback regarding our process or performance please contact the office manager at the site where you were seen or call the main number [phone].  Alternatively, you can send an email to [email or web address]  We are committed to confidentially addressing your feedback and resolving any issues in a timely manner.  We can receive faxes at [phone].

## 2020-08-09 MED ORDER — ATORVASTATIN 80 MG TABLET
ORAL_TABLET | Freq: Every day | ORAL | 1 refills | 90 days | Status: CP
Start: 2020-08-09 — End: 2021-09-08

## 2020-08-10 NOTE — Unmapped (Unsigned)
Pt called for triage, no answer. I have left a voicemail with instructions to call office.

## 2020-08-11 ENCOUNTER — Encounter: Admit: 2020-08-11 | Discharge: 2020-08-12 | Payer: PRIVATE HEALTH INSURANCE

## 2020-08-11 DIAGNOSIS — E1143 Type 2 diabetes mellitus with diabetic autonomic (poly)neuropathy: Principal | ICD-10-CM

## 2020-08-11 DIAGNOSIS — K3184 Gastroparesis: Principal | ICD-10-CM

## 2020-08-11 NOTE — Unmapped (Addendum)
Pamela Gutierrez,  We discussed today about your symptoms of gastroparesis and constipation.     We will repeat the gastric emptying study. This will help [place] decide     2. On days you feel more constipated, you can try miralax 1 capful.         Please make sure to follow up with me in 3 months. If our schedulers do not call you to schedule this appointment, please call and make the appointment using the GI clinic Appointments number below.    If your symptoms acutely worsen, or you develop new symptoms that we did not discuss, please call us (my nurse contact below is [place], which you can call 8AM-4PM Monday through Friday). If it is after hours or on the weekend, please call the hospital operator: [phone] and ask for the Gastroenterology fellow on call.       Helpful phone numbers:    Nurse Contact: Robin Royster  p: [phone] - f: [phone]    GI Clinic Appointments:  [phone]  Please call the GI Clinic appointment line if you need to schedule, reschedule or cancel an appointment in clinic. They can also answer any questions you may have about where your appointment is located and when you need to arrive.      GI Procedure Appointments:  [phone]  Please call the GI Procedures line if you need to schedule, reschedule or cancel any type of GI procedure (endoscopy, colonoscopy, motility testing, etc).  You can also call this number for prep instructions, etc.      Radiology: [phone], option 3  If you are being scheduled for any type of radiology study, you will need to call to receive your appointment time.  Please call this number for information.       For emergencies after normal business hours or on weekends/holidays   Proceed to the nearest emergency room OR contact the [facility] Operator at [phone] who can page the Gastroenterology Fellow on call.        Survey  Please complete a survey about your visit today at:  [email or web address]

## 2020-08-11 NOTE — Unmapped (Signed)
[facility] at [facility]  Initial Consultation Visit    Reason for Visit: {KCDGICLINICRFV:[id number]}  Referring Provider: Loistine Chance   Primary Care Provider: Sherol Dade, MD    Assessment & Plan: Pamela Gutierrez is a 30 y.o. female with a PMHx of *** that presents for ***.     Repeat gastric emptying study to help determine severity   EGD?   Consider erythromycin     I spent a total of *** minutes on the date of this encounter in the delivery of care to this patient.     Patient {ZOXWRUEAV:[id number]} Dr. Marland Kitchen who is in agreement with above assessment and plan.    Dolores Hoose, MD PHD  Fellow Physician - Division of Gastroenterology & Hepatology  [employer]       No follow-ups on file.       Subjective   HPI: Pamela Norlander Miers is a 30 y.o. female with a PMHx of T2DM (last A1c 11.4 07/20/20), gastroparesis, GERD, asthma, WPW and CAD (s/p STEMI with PCI, 11/2019 ), hypercholesterolemia, HS, and MDD that is seen in consultation at the request of Loistine Chance for gastroparesis.     Per PCP note 03/29/20:   Increase from baseline vomiting over the past 4-5 weeks, which has resulted in 2 ED visits on 03/20/20 and 03/22/20.  Labs have been grossly normal without evidence of pancreatitis.  No recent abdominal imaging.  She had abdominal tenderness in epigastrium, right upper quadrant, and right lower quadrant today which she reports is new in the past few days.  Epigastric pain may be suggestive of esophageal etiology such as ulcer or gastroparesis, though this would not explain her concurrent lower abdominal tenderness.  Right-sided abdominal discomfort with rebound is concerning for appendicitis; also question this in the setting of nausea and vomiting, mild fever, clammy skin, and loss of appetite.  Referred for CT abdomen/pelvis. She was continued on Reglan, Phenergan, and Zofran to control gastroparesis  CT scan did not show anything concerning in the abdomen.  There was a slightly abnormal appearance of her cervix, which is nonspecific.    Patient still has daily nausea and weekly vomiting, but symptoms are improved  Follows with [facility]. Continues metformin 500 mg twice daily, Tresiba 36 unites daily, and aspart 6 units three times daily. She recently discontinued Ozempic 0.5 mg daily because it aggrevated her gastroparesis    Previously flares of gastroparesis used to be once per year.  Since December (covid x 2) GI issues have been more severe.  She went to the ER twice and got liquid reglan. Symptoms would get better for about a week or two    Mid-May to Early June, symptoms have gotten better. Now she has had episodes of vomiting maybe once per week.   Usually she now vomits after a meal. She found that fast food made her symptoms worse. No blood in the vomit in the past.   She eats mostly chicken and fish.   Usually she eats only twice a day. Dinner is usually largest meal. In the middle of the day she eats small snacks on occasion.   She has seen an nutirtionist that recommended blending foods   No abdominal pains currently. She sometimes RUQ pains. She doesn't have a gallbladder.   She experiences reflux symptoms. She has taken omeprazole daily. She takes it with her nightly medications. She takes it 30 min to 1 hr before she  eats. She has never taken.   No dysphagia or odynophagia.     Bowel movements alternate from loose to small pellets. She has never taken miralax regularly.   She has a minimum 1 BM per day. Sometimes she will have up to 6, small pellets. She does have to strain. No used manual extraction.   Its been more pellet like recently. Last week she had several large BMs that were formed, but larger in size.     No belching or burping.   Was not have small pellet stools when she had her CT in March.     Reglan she takes 10mg  BID, for the past year.   She uses zofran and phenergan daily. Zofran in the morning and phenergan at night.     EGD and colonoscopy 2015 at [facility].       GI ROS:   {Gi ros:[id number]}    Review of Systems:  10 systems were reviewed and are negative unless otherwise mentioned in the HPI    Allergies:  Lanolin, Morphine (pf), Tomato, Venom-honey bee, Wool, Ibuprofen, Nsaids (non-steroidal anti-inflammatory drug), Iodinated contrast media, and Ioxaglate sodium    Medications:   Prior to Admission medications    Medication Dose, Route, Frequency   acetaminophen (TYLENOL 8 HOUR) 650 MG CR tablet 650 mg, Oral, Daily   acyclovir (ZOVIRAX) 400 MG tablet TAKE 1 TABLET(400 MG) BY MOUTH THREE TIMES DAILY   albuterol HFA 90 mcg/actuation inhaler 2 puffs, Inhalation, Every 4 hours PRN   amLODIPine (NORVASC) 10 MG tablet 10 mg, Oral, Daily (standard)   amoxicillin-clavulanate (AUGMENTIN) 875-125 mg per tablet Take twice daily   aspirin 81 MG chewable tablet 81 mg, Oral, Daily (standard)   atorvastatin (LIPITOR) 80 MG tablet 80 mg, Oral, Daily (standard)   BELSOMRA 10 mg tablet TAKE 1 TABLET(10 MG) BY MOUTH EVERY NIGHT   blood sugar diagnostic (GLUCOSE BLOOD) Strp Test once daily and as directed.one touch verio flex testing strips   blood-glucose meter kit Currently has One Touch Ultra test strips. Please issue per her formulary.   cetirizine 10 mg cap 10 mg, Oral, Daily PRN   clindamycin (CLEOCIN T) 1 % lotion Once daily to body areas that are typically affected   clobetasoL (TEMOVATE) 0.05 % ointment Topical, 2 times a day (standard), As needed for flares up to a week at a time   cyclobenzaprine (FLEXERIL) 10 MG tablet TAKE 1 TABLET(10 MG) BY MOUTH EVERY NIGHT   empty container (SHARPS-A-GATOR DISPOSAL SYSTEM) Misc Use as directed for sharps disposal   evolocumab (REPATHA SYRINGE) 140 mg/mL Syrg Inject the contents of one pen (140 mg) under the skin every fourteen (14) days.   ferrous sulfate 325 (65 FE) MG tablet Oral, Once daily PRN   flash glucose scanning reader (FLASH GLUCOSE SCANNING READER) Other, Take as directed, Dispense Libre 2 scanning reader, use as directed   flash glucose sensor (FLASH GLUCOSE SENSOR) kit Other, Every 14 days, Dispense Libre 2 sensors, either 1 mo supply or 3 mo supply, as per patient preference; change q 14 days   furosemide (LASIX) 40 MG tablet TAKE 1 TABLET BY MOUTH DAILY AS NEEDED FOR> 2 POUND WEIGHT GAIN IN A 24 HOUR PERIOD   gabapentin (NEURONTIN) 300 MG capsule Take 300 mg (1 capsule) in the morning, 300 mg at noon, and 600 mg (2 capsules) in the evening.   HYDROcodone-acetaminophen (NORCO) 5-325 mg per tablet Take 1 tablet every 6 hours as needed for pain   hydrOXYzine (  ATARAX) 25 MG tablet TAKE 1 TABLET(25 MG) BY MOUTH EVERY 8 HOURS AS NEEDED   insulin ASPART (NOVOLOG FLEXPEN) 100 unit/mL (3 mL) injection pen 6 Units, Subcutaneous, 3 times a day (AC), Take 5 units three times per day with meals (2 units with snacks/small meals), plus 2 extra for every 50 points over 150. Up to 50 units per day.   insulin degludec (TRESIBA FLEXTOUCH U-100) 100 unit/mL (3 mL) InPn 36 units per day.   insulin needles, disposable, (BD ULTRA-FINE NANO PEN NEEDLES) 32 x 5/32  Ndle 1 each, Subcutaneous, 5 times a day, Use 5x/day with Lantus, Novolog, and Victoza pens.   insulin syringe-needle U-100 (BD INSULIN SYRINGE ULT-FINE II) 0.5 mL 31 gauge x 5/16 Syrg ok to sub any brand or size insulin syringe preferred by insurance/patient, use 5x/day, dx E11.65   insulin syringe-needle U-100 0.3 mL 31 x 3/8 Syrg For use with Humulin R AC TID.   losartan (COZAAR) 25 MG tablet TAKE 1 TABLET(25 MG) BY MOUTH DAILY   metFORMIN (GLUCOPHAGE-XR) 500 MG 24 hr tablet 500 mg, Oral, 2 times a day   metoclopramide (REGLAN) 10 MG tablet 10 mg, Oral, 4 times a day PRN, Take 1 tablet 4 times daily as neeed    metoprolol succinate (TOPROL-XL) 100 MG 24 hr tablet 100 mg, Oral, Every morning   montelukast (SINGULAIR) 10 mg tablet 10 mg, Oral, Daily PRN   nitroglycerin (NITROSTAT) 0.4 MG SL tablet 0.4 mg, Sublingual, Every 5 min PRN, Maximum of 3 doses in 15 minutes.   NORLYDA 0.35 mg tablet TAKE 1 TABLET(0.35 MG) BY MOUTH DAILY   omeprazole (PRILOSEC) 20 MG capsule 20 mg, Oral, Daily (standard)   ondansetron (ZOFRAN-ODT) 4 MG disintegrating tablet 4 mg, Oral, Every 8 hours PRN   prasugreL (EFFIENT) 10 mg tablet 10 mg, Oral, Daily (standard)   promethazine (PHENERGAN) 25 MG tablet TAKE 1 TABLET(25 MG) BY MOUTH EVERY 6 HOURS AS NEEDED FOR NAUSEA   sertraline (ZOLOFT) 50 MG tablet 75 mg, Oral, Daily (standard)   simethicone 125 mg cap 120 mg, Oral, 2 times a day PRN   spironolactone (ALDACTONE) 100 MG tablet 100 mg, Oral, Daily (standard)   triamcinolone (KENALOG) 0.1 % cream Topical, Once as needed       Medical History:  Past Medical History:   Diagnosis Date   ??? Allergic    ??? Anemia    ??? Asthma    ??? Eczema    ??? Gastroparesis    ??? GERD (gastroesophageal reflux disease)    ??? Hyperlipidemia    ??? Hypertension    ??? Murmur, cardiac    ??? Obesity    ??? 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 Type 2 diabetes mellitus (CMS-HCC)     BS- 90-130       Surgical History:  Past Surgical History:   Procedure Laterality Date   ??? ABLATION OF DYSRHYTHMIC FOCUS     ??? CARDIAC CATHETERIZATION     ??? CHOLECYSTECTOMY  2015   ??? CORONARY STENT PLACEMENT     ??? ELBOW FRACTURE SURGERY     ??? PR CATH PLACE/CORON ANGIO, IMG SUPER/INTERP,W LEFT HEART VENTRICULOGRAPHY N/A 11/12/2019    Procedure: Left Heart Catheterization;  Surgeon: Alvira Philips, MD;  Location: [facility] CATH;  Service: Cardiology   ??? PR COMPRE EP EVAL ABLTJ 3D MAPG TX SVT N/A 06/04/2019    Procedure: Accessory Pathway Ablation;  Surgeon: Joretta Bachelor, MD;  Location:  [facility] EP;  Service: Cardiology   ??? PR ELECTROPHYS EV,R A-V PACE/REC,W/O INDUCT N/A 06/04/2019    Procedure: Comprehensive Study W IND;  Surgeon: Joretta Bachelor, MD;  Location: [facility] EP;  Service: Cardiology   ??? PR EXC SWEAT GLAND LESN AXILL,SIMPL Right 10/30/2015    Procedure: RIGHT AXILLAE EXCISE HIDRADENITIS;  Surgeon: Oren Section, MD;  Location: [facility] OR [facility];  Service: Plastics   ??? PR NEGATIVE PRESSURE WOUND THERAPY DME </= 50 SQ CM N/A 10/30/2015    Procedure: WOUND VAC PLACEMENT;  Surgeon: Oren Section, MD;  Location: [facility];  Service: Plastics   ??? PR SPLIT GRFT TRUNK,ARM,LEG <100 SQCM N/A 10/30/2015    Procedure: SPLIT-THICKNESS SKIN GRAFT;  Surgeon: Oren Section, MD;  Location: [facility];  Service: Plastics       Social History:  Social History     Tobacco Use   ??? Smoking status: Current Some Day Smoker     Types: Cigars   ??? Smokeless tobacco: Never Used   ??? Tobacco comment: Black milds   Substance Use Topics   ??? Alcohol use: Yes     Comment: occasional   ??? Drug use: Yes     Types: Marijuana       Family History:  Family History   Problem Relation Age of Onset   ??? Diabetes Mother    ??? Diabetes Father    ??? Heart disease Maternal Grandmother    ??? Diabetes Maternal Grandmother    ??? Heart disease Paternal Grandmother    ??? Melanoma Neg Hx    ??? Basal cell carcinoma Neg Hx    ??? Squamous cell carcinoma Neg Hx        Previous Abdominal Surgeries:    Colon Cancer Screening Status:              Objective   Vital Signs:  There were no vitals filed for this visit.   There is no height or weight on file to calculate BMI.    Physical Exam:   Gen: WDWN *** in NAD, answers questions appropriately  Eyes: Sclera anicteric, EOMI, PERRLA,  HENT: atraumatic, normocephalic, MMM. OP w/o erythema or exudate   Neck: no cervical lymphadenopathy or thyromegaly, no JVD  Heart: RRR, S1, S2, no M/R/G, no chest wall tenderness  Lungs: CTAB, no crackles or wheezes, no use of accessory muscles  Abdomen: Normoactive bowel sounds, soft, NTND, no rebound/guarding, no hepatosplenomegaly  Extremities: no clubbing, cyanosis, or edema: pulses are +2 in bilateral upper and lower extremities  Neuro: CN II-XI grossly intact, normal cerebellar function, normal gait. No focal deficits.  Skin:  No rashes, lesions on clothed exam  Psych: Alert and oriented, normal mood and affect.     Recent Labs:  Lab Results   Component Value Date    WBC 6.4 03/29/2020    RBC 4.47 03/29/2020    HGB 14.5 03/29/2020    HCT 41.3 03/29/2020    MCV 92.4 03/29/2020    MCH 32.6 (H) 03/29/2020    MCHC 35.2 03/29/2020    RDW 13.0 03/29/2020    PLT 332 03/29/2020    MPV 9.4 03/29/2020       Chemistry        Component Value Date/Time    NA 131 (L) 03/29/2020 1443    NA 138 12/17/2013 1326    K 4.5 03/29/2020 1443    K 4.2 12/17/2013 1326    CL 94 (L) 03/29/2020 1443  CL 101 12/17/2013 1326    CO2 32.0 (H) 03/29/2020 1443    CO2 27 12/17/2013 1326    BUN 17 03/29/2020 1443    BUN 16 12/17/2013 1326    CREATININE 0.56 (L) 03/29/2020 1443    CREATININE 0.50 (L) 12/17/2013 1326    GLU 368 (H) 03/29/2020 1443        Component Value Date/Time    CALCIUM 10.6 (H) 03/29/2020 1443    CALCIUM 8.9 12/17/2013 1326    ALKPHOS 101 03/29/2020 1443    AST 26 03/29/2020 1443    AST 12 (L) 03/24/2013 1103    ALT 32 03/29/2020 1443    ALT 14 (L) 03/24/2013 1103    BILITOT 0.5 03/29/2020 1443        Lab Results   Component Value Date    TSH 1.181 11/12/2019     No results found for: IRON, TIBC, FERRITIN        Previous Endoscopy Procedures:  None     Recent GI Imaging Studies:  CT abdomen and pelvis with contrast 03/29/20  --No CT evidence of acute findings in the abdomen and pelvis.  --Slightly heterogeneous appearance of the cervix which is not specific.  This finding could be secondary to presence of cervicitis, nabothian cysts or could also be associated with menstrual cycle. A cervical malignancy is not favored although not completely excluded. Clinical correlation is recommended      06/21/02 ?? ?? UPPER GI   The patient is a [age]-year-old female referred for exam because   of chronic GERD.     PROCEDURE/FINDINGS: The patient was given barium PO and images of   the upper GI tract were obtained using intravenous fluoroscopy. FINDINGS: The esophagus is normal in caliber and contour. There   is no evidence of abnormal narrowing or mass lesion. The GE   junction is normal. The stomach is normal. The duodenojejunal   junction is located left of midline at the level of the pylorus.   There is no evidence of reflux.     IMPRESSION: Normal upper GI.      06/13/2017 Abdominal ultrasound  IMPRESSION:   1. Liver echogenicity overall is increased, a finding felt to be   indicative of a degree of hepatic steatosis. While no focal liver   lesions are evident on this study, it must be cautioned that the   sensitivity of ultrasound for detection of focal liver lesions is   diminished in this circumstance.     2. ??Gallbladder absent.     3. ??Study otherwise unremarkable.     11/23/2013 NM gastric emptying   COMPARISON: ??Abdominal CT 04/04/2013.     FINDINGS:   Expected location of the stomach in the left upper quadrant.   Ingested meal empties the stomach gradually over the course of the   study.     0% emptied at 1 hr ( normal >= 10%)     39%??emptied at 2 hr ( normal >= 40%)     75% emptied at 3 hr ( normal >= 70%)     92% emptied at 4 hr ( normal >= 90%)     IMPRESSION:   Borderline abnormal gastric emptying study with delayed early   gastric emptying. After 2 hr, the emptying is within normal limits. 8.9 12/17/2013 1326    ALKPHOS 101 03/29/2020 1443    AST 26 03/29/2020 1443    AST 12 (L) 03/24/2013 1103    ALT 32 03/29/2020 1443  ALT 14 (L) 03/24/2013 1103    BILITOT 0.5 03/29/2020 1443        Lab Results   Component Value Date    TSH 1.181 11/12/2019     No results found for: IRON, TIBC, FERRITIN        Previous Endoscopy Procedures:  None     Recent GI Imaging Studies:  CT abdomen and pelvis with contrast 03/29/20  --No CT evidence of acute findings in the abdomen and pelvis.  --Slightly heterogeneous appearance of the cervix which is not specific.  This finding could be secondary to presence of cervicitis, nabothian cysts or could also be associated with menstrual cycle. A cervical malignancy is not favored although not completely excluded. Clinical correlation is recommended      06/21/02 ?? ?? UPPER GI   The patient is a [age]-year-old female referred for exam because   of chronic GERD.     PROCEDURE/FINDINGS: The patient was given barium PO and images of   the upper GI tract were obtained using intravenous fluoroscopy.     FINDINGS: The esophagus is normal in caliber and contour. There   is no evidence of abnormal narrowing or mass lesion. The GE   junction is normal. The stomach is normal. The duodenojejunal   junction is located left of midline at the level of the pylorus.   There is no evidence of reflux.     IMPRESSION: Normal upper GI.      06/13/2017 Abdominal ultrasound  IMPRESSION:   1. Liver echogenicity overall is increased, a finding felt to be   indicative of a degree of hepatic steatosis. While no focal liver   lesions are evident on this study, it must be cautioned that the   sensitivity of ultrasound for detection of focal liver lesions is   diminished in this circumstance.     2. ??Gallbladder absent.     3. ??Study otherwise unremarkable.     11/23/2013 NM gastric emptying   COMPARISON: ??Abdominal CT 04/04/2013.     FINDINGS:   Expected location of the stomach in the left upper quadrant. Ingested meal empties the stomach gradually over the course of the   study.     0% emptied at 1 hr ( normal >= 10%)     39%??emptied at 2 hr ( normal >= 40%)     75% emptied at 3 hr ( normal >= 70%)     92% emptied at 4 hr ( normal >= 90%)     IMPRESSION:   Borderline abnormal gastric emptying study with delayed early   gastric emptying. After 2 hr, the emptying is within normal limits.

## 2020-08-18 ENCOUNTER — Institutional Professional Consult (permissible substitution): Admit: 2020-08-18 | Discharge: 2020-08-19 | Payer: PRIVATE HEALTH INSURANCE

## 2020-08-22 NOTE — Unmapped (Signed)
[facility]  Ep Remote Monitoring   Tel: [phone]  Fax: [phone]    Cardiac Implantable Electronic Device Remote Monitoring-Loop Recorder      Visit Date: 08/18/2020    Reason for Monitoring: Palpitations    Device Findings: Tachy detected episodes; 1  Symptom marked episodes; 1   AT episode 126 hours     Manufacturer of Device:    [employer]    Type of Device:    Implantable Loop Monitor    See scanned/downloaded PDF report for model numbers, serial numbers, and date(s) of implant.    Presenting Rhythm:       normal sinus rhythm    Heart Rate Variability:   Good HRV    Patient Activity:    Patient Active n/a      Plan:    Continue Routine Remote Monitoring    Please see downloaded PDF file of transmission under Media Tab for full details of device interrogation to include, when applicable, battery status, and programmed parameters.

## 2020-08-23 NOTE — Unmapped (Signed)
Spoke with [facility] about answering 1 to question 9 on PHQ9.  She denies plans to hurt herself.  She just states she is tired of all my medical problems.  She continues to see a Veterinary surgeon.

## 2020-08-24 ENCOUNTER — Emergency Department
Admit: 2020-08-24 | Discharge: 2020-08-24 | Disposition: A | Discharge status: Home / Self Care | Payer: PRIVATE HEALTH INSURANCE

## 2020-08-24 ENCOUNTER — Ambulatory Visit
Admit: 2020-08-24 | Discharge: 2020-08-24 | Disposition: A | Discharge status: Home / Self Care | Payer: PRIVATE HEALTH INSURANCE

## 2020-08-24 DIAGNOSIS — R42 Dizziness and giddiness: Principal | ICD-10-CM

## 2020-08-24 LAB — URINALYSIS WITH CULTURE REFLEX
BILIRUBIN UA: NEGATIVE
BLOOD UA: NEGATIVE
GLUCOSE UA: >1000 — AB
KETONES UA: NEGATIVE
LEUKOCYTE ESTERASE UA: NEGATIVE
NITRITE UA: NEGATIVE
PH UA: 6 (ref 5.0–9.0)
PROTEIN UA: NEGATIVE
RBC UA: 1 /HPF (ref <4)
SPECIFIC GRAVITY UA: 1.015 (ref 1.005–1.040)
SQUAMOUS EPITHELIAL: 1 /HPF (ref 0–5)
UROBILINOGEN UA: 0.2
WBC UA: 1 /HPF (ref 0–5)

## 2020-08-24 LAB — CBC W/ AUTO DIFF
BASOPHILS ABSOLUTE COUNT: 0 10*9/L (ref 0.0–0.1)
BASOPHILS RELATIVE PERCENT: 0.3 %
EOSINOPHILS ABSOLUTE COUNT: 0.1 10*9/L (ref 0.0–0.5)
EOSINOPHILS RELATIVE PERCENT: 2 %
HEMATOCRIT: 36.9 % (ref 34.0–44.0)
HEMOGLOBIN: 12.7 g/dL (ref 11.3–14.9)
LYMPHOCYTES ABSOLUTE COUNT: 1.4 10*9/L (ref 1.1–3.6)
LYMPHOCYTES RELATIVE PERCENT: 20.4 %
MEAN CORPUSCULAR HEMOGLOBIN CONC: 34.3 g/dL (ref 32.0–36.0)
MEAN CORPUSCULAR HEMOGLOBIN: 30.4 pg (ref 25.9–32.4)
MEAN CORPUSCULAR VOLUME: 88.6 fL (ref 77.6–95.7)
MEAN PLATELET VOLUME: 10.3 fL (ref 6.8–10.7)
MONOCYTES ABSOLUTE COUNT: 0.5 10*9/L (ref 0.3–0.8)
MONOCYTES RELATIVE PERCENT: 7.1 %
NEUTROPHILS ABSOLUTE COUNT: 4.7 10*9/L (ref 1.8–7.8)
NEUTROPHILS RELATIVE PERCENT: 70.2 %
NUCLEATED RED BLOOD CELLS: 0 /100{WBCs} (ref <=4)
PLATELET COUNT: 274 10*9/L (ref 150–450)
RED BLOOD CELL COUNT: 4.17 10*12/L (ref 3.95–5.13)
RED CELL DISTRIBUTION WIDTH: 13.4 % (ref 12.2–15.2)
WBC ADJUSTED: 6.7 10*9/L (ref 3.6–11.2)

## 2020-08-24 LAB — MAGNESIUM: MAGNESIUM: 1.9 mg/dL (ref 1.6–2.6)

## 2020-08-24 LAB — COMPREHENSIVE METABOLIC PANEL
ALBUMIN: 3.5 g/dL (ref 3.4–5.0)
ALKALINE PHOSPHATASE: 82 U/L (ref 46–116)
ALT (SGPT): 11 U/L (ref 10–49)
ANION GAP: 4 mmol/L — ABNORMAL LOW (ref 5–14)
AST (SGOT): <8 U/L (ref <=34)
BILIRUBIN TOTAL: 0.3 mg/dL (ref 0.3–1.2)
BLOOD UREA NITROGEN: 15 mg/dL (ref 9–23)
BUN / CREAT RATIO: 25
CALCIUM: 9.2 mg/dL (ref 8.7–10.4)
CHLORIDE: 101 mmol/L (ref 98–107)
CO2: 27.8 mmol/L (ref 20.0–31.0)
CREATININE: 0.59 mg/dL — ABNORMAL LOW
EGFR CKD-EPI (2021) FEMALE: >90 mL/min/{1.73_m2} (ref >=60)
GLUCOSE RANDOM: 531 mg/dL (ref 70–179)
POTASSIUM: 4.4 mmol/L (ref 3.4–4.8)
PROTEIN TOTAL: 6.7 g/dL (ref 5.7–8.2)
SODIUM: 133 mmol/L — ABNORMAL LOW (ref 135–145)

## 2020-08-24 LAB — HIGH SENSITIVITY TROPONIN I - SERIAL: HIGH SENSITIVITY TROPONIN I: 7 ng/L (ref <=34)

## 2020-08-24 LAB — HIGH SENSITIVITY TROPONIN I - 2 HOUR SERIAL
HIGH SENSITIVITY TROPONIN - DELTA (0-2H): 0 ng/L (ref <=7)
HIGH-SENSITIVITY TROPONIN I - 2 HOUR: 7 ng/L (ref <=34)

## 2020-08-24 LAB — PROTIME-INR
INR: 0.94
PROTIME: 11 s (ref 10.3–13.4)

## 2020-08-24 LAB — TSH: THYROID STIMULATING HORMONE: 1.16 u[IU]/mL (ref 0.550–4.780)

## 2020-08-24 LAB — APTT
APTT: 30.4 s (ref 24.9–36.9)
HEPARIN CORRELATION: 0.2

## 2020-08-24 MED ADMIN — insulin lispro (HumaLOG) injection 10 Units: 10 [IU] | SUBCUTANEOUS | @ 13:00:00 | Stop: 2020-08-24

## 2020-08-24 MED ADMIN — lactated ringers bolus 1,000 mL: 1000 mL | INTRAVENOUS | @ 12:00:00 | Stop: 2020-08-24

## 2020-08-24 NOTE — Unmapped (Signed)
Cardiac Implantable Electronic Device Remote Monitoring   Device Manufacturer: [employer]  Model: Lux  Loop Recorder  Implant Date: 07/14/2020    PRESENTING ECG and Summary Report              Device Detected Events  Pt had 3 brady events 08/24/2020 at 0502-0506 average rate between 33-35 bpm.     RECOMMENDATIONS  -Continue Routine Monitoring     See PDF in Media file for full interrogation

## 2020-08-24 NOTE — Unmapped (Signed)
Pt in NAD. Jasmine December, NA asked to bring w/c into room for pt's departure.

## 2020-08-24 NOTE — Unmapped (Signed)
I saw and evaluated the patient, participating in the key portions of the service.  I reviewed the resident???s note.  I agree with the resident???s findings and plan. Jhett Fretwell T Tayden Nichelson, MD

## 2020-08-24 NOTE — Unmapped (Signed)
Pt arrives with near syncopal event PTA.  Pt did not hit head or get injured. Per mother usually pt passes out due to bp or sugar.  At home sugar was high here bp 89/60.  Pt diapheretic in triage. Pt roomed.

## 2020-08-24 NOTE — Unmapped (Signed)
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 female with a PMH of T2DM, asthma, HLD, HTN, CAD (s/p PCI to RCA, 11/21), WPW (s/p ablation 05/2019 + loop recorder placement), STEMI, and depression presenting to the ED for evaluation after sustaining a presyncopal episode at 0500 this morning. No prodromal symptoms. Currently feels fatigued.     VS significant for hypotension to 89/60. HR 65. Patient is afebrile and satting 99% on RA. On exam, the patient is alert, oriented, and in NAD. On exam, heart rate normal, regular rhythm. Evidence of holosystolic murmur, best heard at the left upper sternal border. Lungs clear to auscultation bilaterally. 2+ radial, DP, and PT pulses bilaterally. No peripheral edema noted. Normal neurological exam.     Differential includes cardiac syncope vs underlying electrolyte derangements vs orthostatic hypotension vs ACS vs. HHS/DKA.    Plan for EKG, CXR, basic labs, hsTrop, and coags. Will give 1L LR.    8:32 AM  Twelve-lead EKG obtained showed sinus rhythm at a rate of 74 beats per minute with 2 mm ST elevation noted in V2, which appears morphologically similar to previous ECGs.  There are Q waves evident in lead III, biphasic T waves noted in aVF. No other acute ST or T-wave changes.  Findings similar in appearance to ECG on 03/22/2020.  Overall impression is sinus rhythm. CXR clear. CBC, unremarkable. Mag, TSH, APTT, and PT-INR all WNL. Initial hsTrop negative at 7.     8:43 AM   CMP shows glucose elevated to 531. Mild hyponatremia corrects to WNL given hyperglycemia. Anion gap 4. Will give 10 units subq insulin. 10:11 AM  ICD / pacemaker interrogation shows 3 brady events on 08/24/2020 between 0502-0506 with average rate between 33-35 bpm. Repeat trop flat at 7.      11:16 AM  Repeat blood glucose 351.    1:41 PM  Repeat blood glucose 252. Discussed reassuring work up with patient. She has an appointment scheduled with her cardiologist next week. She also has follow up with her endocrinologist in several weeks. As patient has reliable follow-up with relevant caregivers, and as the patient is hemodynamically stable with improved BP, and no further episodes of presyncope during observation in the ED, we believe patient is stable for discharge. Discussed findings with patient who is in agreement with the plan for discharge home at this time. Strict return precautions are discussed and the patient demonstrates understanding. All questions are answered.            Additional Medical Decision Making     I have reviewed the vital signs and the nursing notes. Labs and radiology results that were available during my care of the patient were independently reviewed by me and considered in my medical decision making.     I directly visualized and independently interpreted the EKG tracing.   I independently visualized the radiology images.   I reviewed the patient's prior medical records (08/23/20).     Portions of this record have been created using Scientist, clinical (histocompatibility and immunogenetics). Dictation errors have been sought, but may not have been identified  and corrected.  ____________________________________________         History        Chief Complaint  Near Syncope      HPI   Pamela Gutierrez is a 30 y.o. female with a PMH of T2DM, asthma, HLD, HTN, CAD (s/p PCI to RCA, 11/21), WPW (s/p ablation 05/2019 + loop recorder placement), STEMI, and depression presenting to the ED for syncope. Per mother at bedside, patient had sudden-onset diaphoresis, palpitations, and lightheadedness at 0500 this morning. She subsequently sustained a presyncopal episode. Negative head trauma or LOC. She denies any precipitating chest pain or shortness of breath. Patient regained consciousness a few seconds later, and mom recorded her blood sugar at 390. After the incident, patient has felt generally fatigued. She has been able to ambulate without assistance since then. Patient does mention having a similar episode of syncope 6-7 weeks ago. Otherwise, she denies any recent illness. She has a loop recorder in place to evaluate for potential cardiac arrhythmias given her episodes of recurrent syncope. This was placed in May 2021. She is scheduled to follow up with cardiology next week. No fever, chills, chest pain, shortness of breath, cough, runny nose, abdominal pain, nausea, vomiting, diarrhea, unilateral weakness, slurred speech, or bladder/bowel incontinence.    Past Medical History:   Diagnosis Date   ??? Allergic    ??? Anemia    ??? Asthma    ??? Eczema    ??? Gastroparesis    ??? GERD (gastroesophageal reflux disease)    ??? Hyperlipidemia    ??? Hypertension    ??? Murmur, cardiac    ??? Obesity    ??? 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 Type 2 diabetes mellitus (CMS-HCC)     BS- 90-130     Patient Active Problem List   Diagnosis   ??? Type 2 diabetes mellitus with hyperglycemia, with long-term current use of insulin (CMS-HCC)   ??? GERD (gastroesophageal reflux disease)   ??? Mild intermittent asthma without complication   ??? Seasonal allergies   ??? Anemia   ??? Hypertension   ??? Gastroparesis due to DM (CMS-HCC)   ??? Syncope   ??? WPW (Wolff-Parkinson-White syndrome)   ??? ST elevation myocardial infarction involving right coronary artery (CMS-HCC)   ??? Chest pain   ??? Pure hypercholesterolemia   ??? Left foot drop   ??? Hidradenitis suppurativa   ??? Insomnia   ??? Polyneuropathy associated with underlying disease (CMS-HCC)   ??? Moderate episode of recurrent major depressive disorder (CMS-HCC)   ??? Cervical high risk HPV (human papillomavirus) test positive     Past Surgical History:   Procedure Laterality Date   ??? ABLATION OF DYSRHYTHMIC FOCUS     ??? CARDIAC CATHETERIZATION     ??? CHOLECYSTECTOMY  2015   ??? CORONARY STENT PLACEMENT     ??? ELBOW FRACTURE SURGERY     ??? PR CATH PLACE/CORON ANGIO, IMG SUPER/INTERP,W LEFT HEART VENTRICULOGRAPHY N/A 11/12/2019    Procedure: Left Heart Catheterization;  Surgeon: Alvira Philips, MD;  Location: [facility] CATH;  Service: Cardiology   ??? PR COMPRE EP EVAL ABLTJ 3D MAPG TX SVT N/A 06/04/2019    Procedure: Accessory Pathway Ablation;  Surgeon: Joretta Bachelor, MD;  Location: [facility] EP;  Service: Cardiology   ??? PR ELECTROPHYS EV,R A-V PACE/REC,W/O INDUCT N/A 06/04/2019    Procedure: Comprehensive Study W IND;  Surgeon: Joretta Bachelor, MD;  Location: [facility] EP;  Service: Cardiology   ???  PR EXC SWEAT GLAND LESN AXILL,SIMPL Right 10/30/2015    Procedure: RIGHT AXILLAE EXCISE HIDRADENITIS;  Surgeon: Oren Section, MD;  Location: [facility] HPR;  Service: Plastics   ??? PR NEGATIVE PRESSURE WOUND THERAPY DME </= 50 SQ CM N/A 10/30/2015    Procedure: WOUND VAC PLACEMENT;  Surgeon: Oren Section, MD;  Location: [facility];  Service: Plastics   ??? PR SPLIT GRFT TRUNK,ARM,LEG <100 SQCM N/A 10/30/2015    Procedure: SPLIT-THICKNESS SKIN GRAFT;  Surgeon: Oren Section, MD;  Location: [facility];  Service: Plastics     No current facility-administered medications for this encounter.    Current Outpatient Medications:   ???  acetaminophen (TYLENOL 8 HOUR) 650 MG CR tablet, Take 650 mg by mouth nightly., Disp: , Rfl:   ???  acyclovir (ZOVIRAX) 400 MG tablet, TAKE 1 TABLET(400 MG) BY MOUTH THREE TIMES DAILY, Disp: 90 tablet, Rfl: 0  ???  albuterol HFA 90 mcg/actuation inhaler, Inhale 2 puffs every four (4) hours as needed. , Disp: , Rfl:   ???  amLODIPine (NORVASC) 10 MG tablet, Take 1 tablet (10 mg total) by mouth daily., Disp: 90 tablet, Rfl: 3  ???  amoxicillin-clavulanate (AUGMENTIN) 875-125 mg per tablet, Take twice daily, Disp: 60 tablet, Rfl: 2  ???  aspirin 81 MG chewable tablet, Chew 1 tablet (81 mg total) daily. (Patient taking differently: Chew 81 mg nightly.), Disp: 30 tablet, Rfl: 11  ???  atorvastatin (LIPITOR) 80 MG tablet, Take 1 tablet (80 mg total) by mouth daily., Disp: 90 tablet, Rfl: 1  ???  BELSOMRA 10 mg tablet, TAKE 1 TABLET(10 MG) BY MOUTH EVERY NIGHT, Disp: 90 tablet, Rfl: 1  ???  blood sugar diagnostic (GLUCOSE BLOOD) Strp, Test once daily and as directed.one touch verio flex testing strips, Disp: 100 strip, Rfl: 3  ???  blood-glucose meter kit, Currently has One Touch Ultra test strips. Please issue per her formulary., Disp: 1 each, Rfl: 1  ???  cetirizine 10 mg cap, Take 10 mg by mouth daily as needed. , Disp: , Rfl:   ???  clindamycin (CLEOCIN T) 1 % lotion, Once daily to body areas that are typically affected, Disp: 60 mL, Rfl: 11  ???  clobetasoL (TEMOVATE) 0.05 % ointment, Apply topically Two (2) times a day. As needed for flares up to a week at a time, Disp: 60 g, Rfl: 3  ???  cyclobenzaprine (FLEXERIL) 10 MG tablet, TAKE 1 TABLET(10 MG) BY MOUTH EVERY NIGHT, Disp: 90 tablet, Rfl: 1  ???  empty container (SHARPS-A-GATOR DISPOSAL SYSTEM) Misc, Use as directed for sharps disposal, Disp: 1 each, Rfl: 2  ???  evolocumab (REPATHA SYRINGE) 140 mg/mL Syrg, Inject the contents of one pen (140 mg) under the skin every fourteen (14) days., Disp: 6 mL, Rfl: 3  ???  ferrous sulfate 325 (65 FE) MG tablet, Take by mouth. Once daily PRN, Disp: , Rfl:   ???  flash glucose scanning reader (FLASH GLUCOSE SCANNING READER), by Other route Take as directed. Dispense Libre 2 scanning reader, use as directed, Disp: 1 each, Rfl: 2  ???  flash glucose sensor (FLASH GLUCOSE SENSOR) kit, by Other route every fourteen (14) days. Dispense Libre 2 sensors, either 1 mo supply or 3 mo supply, as per patient preference; change q 14 days, Disp: 6 each, Rfl: 0  ???  furosemide (LASIX) 40 MG tablet, TAKE 1 TABLET BY MOUTH DAILY AS NEEDED FOR> 2 POUND WEIGHT GAIN IN A 24 HOUR PERIOD (Patient  taking differently: 40 mg nightly.), Disp: 90 tablet, Rfl: 1  ???  gabapentin (NEURONTIN) 300 MG capsule, Take 300 mg (1 capsule) in the morning, 300 mg at noon, and 600 mg (2 capsules) in the evening., Disp: 360 capsule, Rfl: 1  ???  HYDROcodone-acetaminophen (NORCO) 5-325 mg per tablet, Take 1 tablet every 6 hours as needed for pain (Patient not taking: Reported on 08/11/2020), Disp: 15 tablet, Rfl: 0  ???  hydrOXYzine (ATARAX) 25 MG tablet, TAKE 1 TABLET(25 MG) BY MOUTH EVERY 8 HOURS AS NEEDED, Disp: 90 tablet, Rfl: 1  ???  insulin ASPART (NOVOLOG FLEXPEN) 100 unit/mL (3 mL) injection pen, Inject 0.06 mL (6 Units total) under the skin Three (3) times a day before meals. Take 5 units three times per day with meals (2 units with snacks/small meals), plus 2 extra for every 50 points over 150. Up to 50 units per day., Disp: 15 mL, Rfl: 12  ???  insulin degludec (TRESIBA FLEXTOUCH U-100) 100 unit/mL (3 mL) InPn, 36 units per day. (Patient taking differently: Inject 42 Units under the skin nightly. 42 hs), Disp: 21 mL, Rfl: 11  ???  insulin needles, disposable, (BD ULTRA-FINE NANO PEN NEEDLES) 32 x 5/32  Ndle, Inject 1 each under the skin Five (5) times a day. Use 5x/day with Lantus, Novolog, and Victoza pens., Disp: 200 each, Rfl: 12  ???  insulin syringe-needle U-100 (BD INSULIN SYRINGE ULT-FINE II) 0.5 mL 31 gauge x 5/16 Syrg, ok to sub any brand or size insulin syringe preferred by insurance/patient, use 5x/day, dx E11.65, Disp: 200 each, Rfl: 5  ???  insulin syringe-needle U-100 0.3 mL 31 x 3/8 Syrg, For use with Humulin R AC TID., Disp: 300 Syringe, Rfl: 3  ???  losartan (COZAAR) 25 MG tablet, TAKE 1 TABLET(25 MG) BY MOUTH DAILY, Disp: 90 tablet, Rfl: 3  ???  metFORMIN (GLUCOPHAGE-XR) 500 MG 24 hr tablet, Take 1 tablet (500 mg total) by mouth two (2) times a day., Disp: 180 tablet, Rfl: 1  ???  metoclopramide (REGLAN) 10 MG tablet, Take 1 tablet (10 mg total) by mouth four (4) times a day as needed. Take 1 tablet 4 times daily as neeed, Disp: 120 tablet, Rfl: 3  ???  metoprolol succinate (TOPROL-XL) 100 MG 24 hr tablet, Take 0.5 tablets (50 mg total) by mouth every morning., Disp: 90 tablet, Rfl: 1  ???  montelukast (SINGULAIR) 10 mg tablet, Take 10 mg by mouth daily as needed. , Disp: , Rfl:   ???  nitroglycerin (NITROSTAT) 0.4 MG SL tablet, Place 1 tablet (0.4 mg total) under the tongue every five (5) minutes as needed for chest pain. Maximum of 3 doses in 15 minutes., Disp: 25 tablet, Rfl: 0  ???  NORLYDA 0.35 mg tablet, TAKE 1 TABLET(0.35 MG) BY MOUTH DAILY, Disp: 84 tablet, Rfl: 3  ???  omeprazole (PRILOSEC) 20 MG capsule, Take 20 mg by mouth daily., Disp: , Rfl:   ???  ondansetron (ZOFRAN-ODT) 4 MG disintegrating tablet, Take 1 tablet (4 mg total) by mouth every eight (8) hours as needed for nausea., Disp: 60 tablet, Rfl: 3  ???  prasugreL (EFFIENT) 10 mg tablet, Take 1 tablet (10 mg total) by mouth daily., Disp: 30 tablet, Rfl: 11  ???  promethazine (PHENERGAN) 25 MG tablet, TAKE 1 TABLET(25 MG) BY MOUTH EVERY 6 HOURS AS NEEDED FOR NAUSEA, Disp: 60 tablet, Rfl: 3  ???  sertraline (ZOLOFT) 50 MG tablet, Take 1.5 tablets (75 mg total) by mouth daily., Disp:  135 tablet, Rfl: 3  ???  simethicone 125 mg cap, Take 250 mg by mouth nightly., Disp: , Rfl:   ???  spironolactone (ALDACTONE) 100 MG tablet, Take 1 tablet (100 mg total) by mouth daily., Disp: 90 tablet, Rfl: 1  ???  triamcinolone (KENALOG) 0.1 % cream, Apply topically once as needed. , Disp: , Rfl:     Allergies  Lanolin, Morphine (pf), Tomato, Venom-honey bee, Wool, Ibuprofen, Nsaids (non-steroidal anti-inflammatory drug), Iodinated contrast media, and Ioxaglate sodium    Family History   Problem Relation Age of Onset   ??? Diabetes Mother    ??? Diabetes Father    ??? Heart disease Maternal Grandmother    ??? Diabetes Maternal Grandmother    ??? Heart disease Paternal Grandmother    ??? Melanoma Neg Hx    ??? Basal cell carcinoma Neg Hx    ??? Squamous cell carcinoma Neg Hx      Social History  Social History     Tobacco Use   ??? Smoking status: Current Some Day Smoker     Types: Cigars   ??? Smokeless tobacco: Never Used   ??? Tobacco comment: Black milds   Vaping Use   ??? Vaping Use: Never used   Substance Use Topics   ??? Alcohol use: Yes     Alcohol/week: 1.0 standard drink     Types: 1 Shots of liquor per week     Comment: occasional   ??? Drug use: Yes     Frequency: 2.0 times per week     Types: Marijuana     Review of Systems  Constitutional: Positive for fatigue. Negative for fever.  Eyes: Negative for visual changes.  ENT: Negative for sore throat.  Cardiovascular: Negative for chest pain.  Respiratory: Negative for shortness of breath.  Gastrointestinal: Negative for abdominal pain, vomiting or diarrhea.  Genitourinary: Negative for dysuria.   Musculoskeletal: Negative for back pain.  Skin: Negative for rash.  Neurological: Positive for lightheadedness and syncope. Negative for headaches, focal weakness or numbness.     Physical Exam     This provider entered the patient's room: Yes:    ??? If this provider did not enter the room, a comprehensive physical exam was not able to be performed due to increased infection risk to themselves, other providers, staff and other patients), as well as to conserve personal protective equipment (PPE) utilization during the COVID-19 pandemic.    ??? If this provider did enter the patient room, the following was PPE worn: Surgical mask, eye protection, gown and gloves    ED Triage Vitals   Enc Vitals Group      BP 08/24/20 0615 89/60      Heart Rate 08/24/20 0625 65      SpO2 Pulse --       Resp 08/24/20 0625 20      Temp 08/24/20 0625 36.6 ??C (97.9 ??F)      Temp Source 08/24/20 0625 Oral      SpO2 08/24/20 0625 99 %      Weight --       Height --      Constitutional: Alert and oriented. Well appearing and in no distress.  Eyes: Conjunctivae are normal.  ENT       Head: Normocephalic and atraumatic.       Nose: No congestion.       Mouth/Throat: Mucous membranes are moist. Neck: No stridor.  Hematological/Lymphatic/Immunilogical: No cervical lymphadenopathy.  Cardiovascular: Normal rate, regular rhythm. Evidence  of holosystolic murmur, best heard at the left upper sternal border. 2+ radial, DP, and PT pulses bilaterally. No peripheral edema noted.   Respiratory: Normal respiratory effort. Breath sounds are normal.  Gastrointestinal: Soft and nontender. There is no CVA tenderness.  Musculoskeletal: Normal range of motion in all extremities.       Right lower leg: No tenderness or edema.       Left lower leg: No tenderness or edema.  Neurologic: Normal speech and language. No gross focal neurologic deficits are appreciated.  Skin: Skin is warm, dry and intact. No rash noted.  Psychiatric: Mood and affect are normal. Speech and behavior are normal.     EKG     EKG results  Twelve-lead EKG obtained showed sinus rhythm at a rate of 74 beats per minute.  Normal intervals.  No acute ectopic beats.  Evidence of 2 mm ST elevation noted in V2, which appears morphologically similar to previous ECGs.  There are Q waves evident in lead III, biphasic T waves noted in aVF. No other acute ST or T-wave changes.  Findings similar in appearance to ECG on 03/22/2020.  Overall impression is sinus rhythm.         Radiology     ICD / Pacemaker Evaluation   Final Result      XR Chest Portable   Final Result      No evidence of acute cardiopulmonary pathology.          Procedures     n/a    Attestations     Documentation assistance was provided by Lenward Chancellor, Scribe, on August 10, 2020 at 6:23 PM for Sabino Gasser, MD.        Sabino Gasser, MD  08/28/20 [phone]

## 2020-08-25 MED ORDER — CYCLOBENZAPRINE 10 MG TABLET
ORAL_TABLET | 1 refills | 0 days | Status: CP
Start: 2020-08-25 — End: ?

## 2020-08-25 MED ORDER — METOPROLOL SUCCINATE ER 100 MG TABLET,EXTENDED RELEASE 24 HR
ORAL_TABLET | Freq: Every morning | ORAL | 1 refills | 180.00000 days
Start: 2020-08-25 — End: ?

## 2020-08-25 NOTE — Unmapped (Signed)
Patient is requesting the following refill  Requested Prescriptions     Pending Prescriptions Disp Refills   ??? cyclobenzaprine (FLEXERIL) 10 MG tablet [Pharmacy Med Name: CYCLOBENZAPRINE 10MG  TABLETS] 90 tablet 0     Sig: TAKE 1 TABLET(10 MG) BY MOUTH EVERY NIGHT       Order pended. Please advise. Thanks    Last OV: 07/31/2020   Next OV: 10/11/2020

## 2020-08-25 NOTE — Unmapped (Signed)
CARDIAC REHABILITATION  INDIVIDUALIZED TREATMENT PLAN  ITP Phase: Final   Date: 08/25/2020    Patient Name: Pamela Gutierrez                                              Angina Scale: No symptoms  Diagnosis:    Diagnosis ICD-10-CM Associated Orders   1. ST elevation myocardial infarction involving right coronary artery (CMS-HCC)  I21.11          EXERCISE PRESCRIPTION    EXERCISE GOALS:  EXERCISE GOALS 01/20/2020 01/31/2020 02/14/2020 03/13/2020 04/03/2020 06/27/2020 07/26/2020   Patient goal Improve energy level Improve energy level Improve energy level Improve energy level Improve energy level Improve energy level Improve energy level   Other Goals Patient will demonstrate an ability to take their own pulse;Patient will demonstrate an understanding of their exercise prescription;Patient will demonstrate knowledge of safe exercise parameters;Patient will increase the duration and/or intensity of exercise;Increase MET level as appropriate Patient will demonstrate an ability to take their own pulse;Patient will demonstrate an understanding of their exercise prescription;Patient will demonstrate knowledge of safe exercise parameters;Patient will increase the duration and/or intensity of exercise;Increase MET level as appropriate Patient will demonstrate an ability to take their own pulse;Patient will demonstrate an understanding of their exercise prescription;Patient will demonstrate knowledge of safe exercise parameters;Patient will increase the duration and/or intensity of exercise;Increase MET level as appropriate Patient will demonstrate an ability to take their own pulse;Patient will demonstrate an understanding of their exercise prescription;Patient will demonstrate knowledge of safe exercise parameters;Patient will increase the duration and/or intensity of exercise;Increase MET level as appropriate Patient will demonstrate an ability to take their own pulse;Patient will demonstrate an understanding of their exercise prescription;Patient will demonstrate knowledge of safe exercise parameters;Patient will increase the duration and/or intensity of exercise;Increase MET level as appropriate Patient will demonstrate an ability to take their own pulse;Patient will demonstrate an understanding of their exercise prescription;Patient will demonstrate knowledge of safe exercise parameters;Patient will increase the duration and/or intensity of exercise;Increase MET level as appropriate Patient will demonstrate an ability to take their own pulse;Patient will demonstrate an understanding of their exercise prescription;Patient will demonstrate knowledge of safe exercise parameters;Patient will increase the duration and/or intensity of exercise;Increase MET level as appropriate   Progress towards goal In Progress;Verbalizes understanding In Progress;Verbalizes understanding In Progress;Verbalizes understanding In Progress;Verbalizes understanding No No No       EXERCISE INTERVENTIONS:  EXERCISE INTERVENTIONS 01/20/2020 01/31/2020 02/14/2020 03/13/2020 04/03/2020 06/27/2020 07/26/2020   Interventions Educate patient on Dyspnea scale;Educate patient on exercise prescription, THRR, RPE scale, and MET level;Educate patient on normal/abnormal response to exercise;Educate patient on pulse-taking techniques;Orient patient to equipment safety guidelines;Participate in warm-up and cool-down;Provide home exercise plan and community resources for maintenance Educate patient on Dyspnea scale;Educate patient on exercise prescription, THRR, RPE scale, and MET level;Educate patient on normal/abnormal response to exercise;Educate patient on pulse-taking techniques;Orient patient to equipment safety guidelines;Participate in warm-up and cool-down;Provide home exercise plan and community resources for maintenance Educate patient on Dyspnea scale;Educate patient on exercise prescription, THRR, RPE scale, and MET level;Educate patient on normal/abnormal response to exercise;Educate patient on pulse-taking techniques;Orient patient to equipment safety guidelines;Participate in warm-up and cool-down;Provide home exercise plan and community resources for maintenance Educate patient on Dyspnea scale;Educate patient on exercise prescription, THRR, RPE scale, and MET level;Educate patient on normal/abnormal response to exercise;Educate patient on pulse-taking techniques;Orient  patient to equipment safety guidelines;Participate in warm-up and cool-down;Provide home exercise plan and community resources for maintenance Educate patient on Dyspnea scale;Educate patient on exercise prescription, THRR, RPE scale, and MET level;Educate patient on normal/abnormal response to exercise;Educate patient on pulse-taking techniques;Orient patient to equipment safety guidelines;Participate in warm-up and cool-down;Provide home exercise plan and community resources for maintenance Educate patient on Dyspnea scale;Educate patient on exercise prescription, THRR, RPE scale, and MET level;Educate patient on normal/abnormal response to exercise;Educate patient on pulse-taking techniques;Orient patient to equipment safety guidelines;Participate in warm-up and cool-down;Provide home exercise plan and community resources for maintenance Educate patient on Dyspnea scale;Educate patient on exercise prescription, THRR, RPE scale, and MET level;Educate patient on normal/abnormal response to exercise;Educate patient on pulse-taking techniques;Orient patient to equipment safety guidelines;Participate in warm-up and cool-down;Provide home exercise plan and community resources for maintenance       EXERCISE ASSSESSMENT:  EXERCISE PRESCRIPTION 04/03/2020 06/27/2020 07/26/2020 08/23/2020 08/25/2020 08/25/2020 08/25/2020   ITP Phase Early Exit Initial 60 day Final Final Final Final   Special Precautions / Protocols Indicated Continuous telemetry;Diabetic precautions;Fall precautions;Orthopedic precautions Continuous telemetry;Diabetic precautions;Fall precautions;Orthopedic precautions Continuous telemetry;Diabetic precautions;Fall precautions;Orthopedic precautions - - - -   Supplemental O2 Required? No No No - - - -       AEROBIC CONDITIONING / EXERCISE PLAN:  AEROBIC CONDITIONING / EXERCISE PLAN 01/20/2020 01/31/2020 02/14/2020 03/13/2020 04/03/2020 06/27/2020 07/26/2020   Frequency per day 1x per day 1x per day 1x per day 1x per day 1x per day 1x per day 1x per day   Frequency per week 3xs weekly 3xs weekly 3xs weekly 3xs weekly 3xs weekly 3xs weekly 3xs weekly   Aerobic minutes per session 31 - 45 31 - 45 31 - 45 31 - 45 31 - 45 31 - 45 31 - 45   RPE/BORG 3 - 6 - - - - - -   THRR Low 130 130 130 130 130 130 130   THRR High 150 150 150 150 150 150 150   Determined Predicted HR Calculated on HR Reserve formula;Calculated on  age predicted HR Calculated on HR Reserve formula;Calculated on  age predicted HR Calculated on HR Reserve formula;Calculated on  age predicted HR Calculated on HR Reserve formula;Calculated on  age predicted HR Calculated on HR Reserve formula;Calculated on  age predicted HR Calculated on HR Reserve formula;Calculated on  age predicted HR Calculated on HR Reserve formula;Calculated on  age predicted HR   Rehab Progression Decrease rest interval duration;Gradually increase frequency and duration Decrease rest interval duration;Gradually increase frequency and duration Decrease rest interval duration;Gradually increase frequency and duration Decrease rest interval duration;Gradually increase frequency and duration Decrease rest interval duration;Gradually increase frequency and duration Decrease rest interval duration;Gradually increase frequency and duration Decrease rest interval duration;Gradually increase frequency and duration       AEROBIC CONDITIONING / MODES OF EXERCISE:  AEROBIC CONDITIONING / MODES OF EXERCISE: 01/20/2020 01/31/2020 02/14/2020 03/13/2020 04/03/2020 06/27/2020 07/26/2020 Stationary Bike Recumbent Recumbent Recumbent Recumbent Recumbent Recumbent Recumbent   Level 1 1 1 1 1  - -   Elliptical No No No No No No No   NuStep Yes Yes Yes Yes Yes Yes Yes   Level 3 3 3 5 5  - 4   Rower No No No No No No No   Track Yes Yes Yes Yes Yes Yes Yes   Treadmill Yes Yes Yes Yes Yes Yes Yes   MPH 2 2 2 2 2  - -   UBE Yes Yes Yes Yes Yes  Yes Yes   Level 2 2 2 2 2  - -       STRENGTH TRAINING / EXERCISE PLAN:  RESISTANCE TRAINING 01/20/2020 01/31/2020 02/14/2020 03/13/2020 04/03/2020 06/27/2020 07/26/2020   Resistance Training Yes Yes Yes Yes Yes Yes Yes   Modes theraband;body weight theraband;body weight theraband;body weight theraband;body weight theraband;body weight - -   Frequency 2-3 x week 2-3 x week 2-3 x week 2-3 x week 2-3 x week - -   Intensity / RPE 3 - 6 3 - 6 3 - 6 3 - 6 3 - 6 - -   Repetitions 10 - 12 10 - 12 10 - 12 10 - 12 10 - 12 - -   Sets 2 2 2 2 2  - -   Limitations or precautions orthopedics orthopedics orthopedics orthopedics orthopedics - -   Progression Increase resistance amount when patient successfully completes the prescribed number of  sets and reps with RPE below 3 (1-10 scale) Increase resistance amount when patient successfully completes the prescribed number of  sets and reps with RPE below 3 (1-10 scale) Increase resistance amount when patient successfully completes the prescribed number of  sets and reps with RPE below 3 (1-10 scale) Increase resistance amount when patient successfully completes the prescribed number of  sets and reps with RPE below 3 (1-10 scale) Increase resistance amount when patient successfully completes the prescribed number of  sets and reps with RPE below 3 (1-10 scale) - -       OTHER TRAINING / EXERCISE PLAN:  OTHER TRAINING 02/14/2020 03/13/2020 04/03/2020 06/27/2020 06/28/2020 07/26/2020 08/25/2020   Balance Training Yes - Daily dynamic balance exercises in warm up and cool down phase of aerobic exercise session. Yes - Daily dynamic balance exercises in warm up and cool down phase of aerobic exercise session. Yes - Daily dynamic balance exercises in warm up and cool down phase of aerobic exercise session. Yes - Daily dynamic balance exercises in warm up and cool down phase of aerobic exercise session. - Yes - Daily dynamic balance exercises in warm up and cool down phase of aerobic exercise session. -   Flexibility training type static stretching post cardio or resistance training for all major muscle groups x 16 - 30sec duration. static stretching post cardio or resistance training for all major muscle groups x 16 - 30sec duration. static stretching post cardio or resistance training for all major muscle groups x 16 - 30sec duration. static stretching post cardio or resistance training for all major muscle groups x 16 - 30sec duration. - static stretching post cardio or resistance training for all major muscle groups x 16 - 30sec duration. -   Home Exercise Yes Yes Yes Yes - Yes -   Other Exercise Comments Today was Pamela Gutierrez's initial cardiac rehab exercise session. Patient was educated on what signs/symptoms should be reported to the cardiac rehab staff. Patient was also educated on Diplomatic Services operational officer. Pamela Gutierrez managed 33 minutes on the Nu-Step at level 3. Exercise was tolerated with no signs/symptoms or complaints. However, pre exercise blood glucose was 374. Due to the elevated reading, cardiologist on staff was consulted and patient deemed safe to exercise. Pamela Gutierrez stated she had not taken any of her diabetes medicine this morning as she was nervous to have a low blood glucose reading. Discussed the importance of taking her medication as prescribed. She verbalized understanding. Post exercise blood glucose was 317. Will continue to monitor blood glucose levels pre and post exercise for the next few  sessions. Patient demonstrated verbal understanding of all recommendations and education during today???s session. Patient was monitored via telemetry and we will notify the MD if changes or symptoms are noted. Patient understands that masks are required to be worn during each visit with cardiac rehab. Pamela Gutierrez is progressing in strength and endurance. She has increased her workload on the NuStep. She states that she feels she has gained endurance. She is monitored by telemetry and no EKG changes have been noted. She demonstrates understanding of THRR, RPE scale and s/sx to report. Pamela Gutierrez will exit the program early due to other health issues. She has not met her goals. She will call us when she is able and we will request a new referral. Pamela Gutierrez will exit the program early due to other health issues. She has not met her goals. She will call us when she is able and we will request a new referral. Pamela Gutierrez re-started cardiac rehab today. She used the Nustep for 30 minutes. She was asymptomatic throughout the session but was fatigued at the end of her 30 minutes. We checked Pamela Gutierrez early without her participating in the strength training; she can try that part of the program next week. Pamela Gutierrez states that her energy is increasing. She uses an exercise band for RT on tuesday and thursday. Her Mom and brother feel she is walking better. she is monitored by telemetry and no EKG changes have been noted. She demonstrates understanding of THRR, RPE scale, and s/sx to report. Pamela Gutierrez graduated from cardiac rehab today, completing 36 supervised exercise sessions.  Patient has progressed well and attended regularly. She plans to exercise on her own by exercising at a local gym with her brother. Patient has met education goals and individual goals by attending CR regularly and adopting heart healthy lifestyle changes.  Patient has been monitored via telemetry with no noted changes to EKG during exercise. She has had no complaints of chest pain. Patient has been given a tshirt, theraband and exercise manual. She has been given a new THRR and exercise prescription.  Patient demonstrates understanding of the new exercise prescription, RPE scale and s/sx to report to physician.       EXERCISE EDUCATION CLASSES:  EXERCISE EDUCATION 03/28/2020 03/28/2020 08/15/2020   Exercise Education Exercise for Life Strength Training Exercise for Life   Attend date 03/24/2020 03/31/2020 08/25/2020   Other comments [facility]         NUTRITION ASSESSMENT    NUTRITION GOALS:  NUTRITION GOALS 01/31/2020 02/14/2020 03/13/2020 04/03/2020 06/27/2020 07/26/2020 08/25/2020   Other Goals Patient will lose 1-2 lbs. per week if BMI is greater than 25;Patient will improve nutrition assessment score;Patient will improve serum cholesterol status;Patient will decrease waist circumference;Patient will increase daily water intake Patient will lose 1-2 lbs. per week if BMI is greater than 25;Patient will improve nutrition assessment score;Patient will improve serum cholesterol status;Patient will decrease waist circumference;Patient will increase daily water intake Patient will lose 1-2 lbs. per week if BMI is greater than 25;Patient will improve nutrition assessment score;Patient will improve serum cholesterol status;Patient will decrease waist circumference;Patient will increase daily water intake Patient will lose 1-2 lbs. per week if BMI is greater than 25;Patient will improve nutrition assessment score;Patient will improve serum cholesterol status;Patient will decrease waist circumference;Patient will increase daily water intake Patient will lose 1-2 lbs. per week if BMI is greater than 25;Patient will improve nutrition assessment score;Patient will improve serum cholesterol status;Patient will decrease waist circumference;Patient will increase daily  water intake Patient will lose 1-2 lbs. per week if BMI is greater than 25;Patient will improve nutrition assessment score;Patient will improve serum cholesterol status;Patient will decrease waist circumference;Patient will increase daily water intake Patient will lose 1-2 lbs. per week if BMI is greater than 25;Patient will improve nutrition assessment score;Patient will improve serum cholesterol status;Patient will decrease waist circumference;Patient will increase daily water intake   Progress towards goal Verbalizes understanding Verbalizes understanding Verbalizes understanding Verbalizes understanding Verbalizes understanding Verbalizes understanding Verbalizes understanding       NUTRITION INTERVENTIONS:  NUTRITION INTERVENTIONS 01/31/2020 02/14/2020 03/13/2020 04/03/2020 06/27/2020 07/26/2020 08/25/2020   Interventions Educate the patient on healthy weight loss;Attend nutrition education classes;Monitor weight at each exercise session;Attend individual consultation with the RD;Encourage patient to bring water bottle to each exercise session;Educate the patient on the importance of proper hydration;Educate the patient on correlation between heart disease and cholesterol status Educate the patient on healthy weight loss;Attend nutrition education classes;Monitor weight at each exercise session;Attend individual consultation with the RD;Encourage patient to bring water bottle to each exercise session;Educate the patient on the importance of proper hydration;Educate the patient on correlation between heart disease and cholesterol status Educate the patient on healthy weight loss;Attend nutrition education classes;Monitor weight at each exercise session;Attend individual consultation with the RD;Encourage patient to bring water bottle to each exercise session;Educate the patient on the importance of proper hydration;Educate the patient on correlation between heart disease and cholesterol status Educate the patient on healthy weight loss;Attend nutrition education classes;Monitor weight at each exercise session;Attend individual consultation with the RD;Encourage patient to bring water bottle to each exercise session;Educate the patient on the importance of proper hydration;Educate the patient on correlation between heart disease and cholesterol status Educate the patient on healthy weight loss;Attend nutrition education classes;Monitor weight at each exercise session;Attend individual consultation with the RD;Encourage patient to bring water bottle to each exercise session;Educate the patient on the importance of proper hydration;Educate the patient on correlation between heart disease and cholesterol status Educate the patient on healthy weight loss;Attend nutrition education classes;Monitor weight at each exercise session;Attend individual consultation with the RD;Encourage patient to bring water bottle to each exercise session;Educate the patient on the importance of proper hydration;Educate the patient on correlation between heart disease and cholesterol status Educate the patient on healthy weight loss;Attend nutrition education classes;Monitor weight at each exercise session;Attend individual consultation with the RD;Encourage patient to bring water bottle to each exercise session;Educate the patient on the importance of proper hydration;Educate the patient on correlation between heart disease and cholesterol status       NUTRITION ASSESSMENT:  NUTRITION ASSESSMENT 08/11/2020 08/14/2020 08/16/2020 08/18/2020 08/21/2020 08/23/2020 08/25/2020   Weight 169 lb 161 lb 3.2 oz 160 lb 14.4 oz 161 lb 159 lb 14.4 oz 160 lb 3.2 oz 160 lb 4.8 oz   Height 5' 5.5 - - - - - -   BMI (Calculated) 27.69 - - - - - -   Method/Tool - - - - - PYP PYP   Score - - - - - 67 67   Other Nutrition Comments - - - - - - Continues to work on healthy lifestyle choices.  She has met her goals.       Lipids:  Total Cholesterol:   Cholesterol   Date Value Ref Range Status   02/21/2020 110 <=200 mg/dL Final     Cholesterol, Total   Date Value Ref Range Status   12/17/2013 169 100 - 199 mg/dL Final     Trig:  Triglycerides   Date Value Ref Range Status   11/12/2019 65 0 - 150 mg/dL Final   16/10/9602 540 (H) 1 - 149 mg/dL Final     Hdl:   HDL   Date Value Ref Range Status   02/21/2020 24 (L) 40 - 60 mg/dL Final   98/11/9145 27 (L) 40 - 59 mg/dL Final     LDL:   LDL Calculated   Date Value Ref Range Status   11/12/2019 87 40 - 99 mg/dL Final     Comment:     NHLBI Recommended Ranges, LDL Cholesterol, for Adults (20+yrs) (ATPIII), mg/dL  Optimal              <829  Near Optimal        100-129  Borderline High     130-159  High                160-189  Very High            >=190  NHLBI Recommended Ranges, LDL Cholesterol, for Children (2-19 yrs), mg/dL  Desirable            <562  Borderline High     110-129  High                 >=130       LDL Direct   Date Value Ref Range Status   06/21/2020 137.6 mg/dL Final     Comment:     NHLBI Recommended Ranges, LDL Cholesterol, for Adults (20+yrs) (ATPIII), mg/dL  Optimal              <130  Near Optimal        100-129  Borderline High     130-159  High                160-189  Very High            >=190  NHLBI Recommended Ranges, LDL Cholesterol, for Children (2-19 yrs), mg/dL  Desirable            <865  Borderline High     110-129  High                 >=130       11/18/2012 128 mg/dL Final     Comment:     :  ADULTS (20 years or older)  Optimal         <100  Near Optimal    100-129  Borderline High 130-159  High            160-189  Very High       >=190  CHILDREN (2-19 years)  Desirable       <110  Borderline High 110-129  High            >/= 130       NUTRITION EDUCATION CLASSES:  NUTRITION EDUCATION 02/16/2020 03/14/2020 07/04/2020 07/25/2020 08/15/2020   Nutrition Education One to One Consult Reading Food Labels Low Sodium Lipid Management Reading Food Labels   Attend date 02/15/2020 03/17/2020 07/07/2020 07/25/2020 08/18/2020   Other comments - [facility] Chart MyChart MyChart MyChart         PSYCHOSOCIAL ASSESSMENT    PSYCHOSOCIAL GOALS:  PSYCHOSOCIAL GOALS 02/14/2020 03/13/2020 04/03/2020 06/27/2020 07/26/2020 08/23/2020 08/25/2020   Other Goals Patient will improve psychosocial assessment scores;Patient will maximize coping skills;Patient will demonstrate relaxation techniques;Patient will identify support systems;Patient will report decreased stress levels Patient will improve psychosocial  assessment scores;Patient will maximize coping skills;Patient will demonstrate relaxation techniques;Patient will identify support systems;Patient will report decreased stress levels Patient will improve psychosocial assessment scores;Patient will maximize coping skills;Patient will demonstrate relaxation techniques;Patient will identify support systems;Patient will report decreased stress levels Patient will improve psychosocial assessment scores;Patient will maximize coping skills;Patient will demonstrate relaxation techniques;Patient will identify support systems;Patient will report decreased stress levels Patient will improve psychosocial assessment scores;Patient will maximize coping skills;Patient will demonstrate relaxation techniques;Patient will identify support systems;Patient will report decreased stress levels Patient will improve psychosocial assessment scores;Patient will maximize coping skills;Patient will demonstrate relaxation techniques;Patient will identify support systems;Patient will report decreased stress levels Patient will improve psychosocial assessment scores;Patient will maximize coping skills;Patient will demonstrate relaxation techniques;Patient will identify support systems;Patient will report decreased stress levels   Progress towards goal In progress;Verbalizes understanding In progress;Verbalizes understanding In progress;Verbalizes understanding In progress;Verbalizes understanding In progress;Verbalizes understanding In progress;Verbalizes understanding In progress;Verbalizes understanding       PSYCHOSOCIAL INTERVENTIONS:  PSYCHOSOCIAL INTERVENTIONS 02/14/2020 03/13/2020 04/03/2020 06/27/2020 07/26/2020 08/23/2020 08/25/2020   Interventions Refer to Vocational Rehab;Attend stress management class;Attend individual consultation with mental health professional;Instruct in relaxation techniques and coping skills Refer to Vocational Rehab;Attend stress management class;Attend individual consultation with mental health professional;Instruct in relaxation techniques and coping skills Refer to Vocational Rehab;Attend stress management class;Attend individual consultation with mental health professional;Instruct in relaxation techniques and coping skills Refer to [facility]ational [facility];Attend stress management class;Attend individual consultation with mental health professional;Instruct in relaxation techniques and coping skills Refer to [facility]ational [facility];Attend stress management class;Attend individual consultation with mental health professional;Instruct in relaxation techniques and coping skills Refer to [facility]ocational [facility];Attend stress management class;Attend individual consultation with mental health professional;Instruct in relaxation techniques and coping skills Refer to Vocational Re[facility];Attend stress management class;Attend individual consultation with mental health professional;Instruct in relaxation techniques and coping skills       PSYCHOSOCIAL ASSESSMENT:  PSYCHOSOCIAL ASSESSEMENT 03/10/2020 03/13/2020 04/03/2020 06/27/2020 07/26/2020 08/23/2020 08/25/2020   Psychosocial Method/Tool - ESSI ESSI ESSI ESSI ESSI ESSI   Psychosocial Score - 22 22 22 22 20 20    SF 12 Physical score - 14 14 14 14 11 11    Depression Method/Tool - PHQ9 PHQ9 PHQ9 PHQ9 PHQ9 PHQ9   Depression Score - 15 15 15 15 7 7    Anxiety Method/Tool - GAD GAD GAD GAD GAD GAD   Anxiety Score - 9 9 9 9 11 11    Current State - On meds On meds On meds On meds On meds On meds   Other Psychosocial Comments Per Covid 19 protocol for safety, CR Individual Stress Management services were provided. Stress: Identifies walking anywhere - causes fatigue and sometimes shortness of breath as primary stress. PHQ 9=  15 suggesting a moderately severe level of depression, including little interest or pleasure and low mood with occasional thoughts that it would be OK not to wake up, but has not thought of hurting herself, has difficulty falling and staying asleep, low energy, poor appetite, and feeling fidgety;  started Zoloft 75 mg daily about one month ago; is tolerating without side effects; discussed onset of action and likelihood that she will begin to see additional benefits over the next few months; has first outpatient, online psychotherapy appointment scheduled for March 8 GAD= 9 suggesting a moderate level of anxiety including worry, difficulty relaxing, feeling restless, easily annoyed, and fearful that something else bad will happen. ESSI= 22 indicating a high level of social support including family, friends, and a few co-workers   INTERVENTIONS: Reviewed  results of PHQ. 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 Stated Goal to improve walking so she can switch from walker to cane. Has home exercise plan from PT and goes to park to exercise with her brothers. Excellent attendance in cardiac rehab, so very motivated to improve functional status. Sleep difficulties started following skin graft under R armpit (DFA and DSA). Uses an app for sleep that a friend told her about. Recognizes racing heart when feeling distressed, and uses mindful breathing. Suggested she view Stress Management classes 1 and 4 to to help with these tools. Appreciates the support of family and friends, boss who welcomes her back to work when her health permits. 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 Follow up: Available while in cardiac rehab. Time: 70 minutes Per Covid 19 protocol for safety, CR Individual Stress Management services were provided. Stress: Identifies walking anywhere - causes fatigue and sometimes shortness of breath as primary stress. PHQ 9=  15 suggesting a moderately severe level of depression, including little interest or pleasure and low mood with occasional thoughts that it would be OK not to wake up, but has not thought of hurting herself, has difficulty falling and staying asleep, low energy, poor appetite, and feeling fidgety;  started Zoloft 75 mg daily about one month ago; is tolerating without side effects; discussed onset of action and likelihood that she will begin to see additional benefits over the next few months; has first outpatient, online psychotherapy appointment scheduled for March 8 GAD= 9 suggesting a moderate level of anxiety including worry, difficulty relaxing, feeling restless, easily annoyed, and fearful that something else bad will happen. ESSI= 22 indicating a high level of social support including family, friends, and a few co-workers   INTERVENTIONS: Reviewed results of PHQ. 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 Stated Goal to improve walking so she can switch from walker to cane. Has home exercise plan from PT and goes to park to exercise with her brothers. Excellent attendance in cardiac rehab, so very motivated to improve functional status. Sleep difficulties started following skin graft under R armpit (DFA and DSA). Uses an app for sleep that a friend told her about. Recognizes racing heart when feeling distressed, and uses mindful breathing. Suggested she view Stress Management classes 1 and 4 to to help with these tools. Appreciates the support of family and friends, boss who welcomes her back to work when her health permits. 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 Follow up: Available while in cardiac rehab. Time: 70 minutes Per Covid 19 protocol for safety, CR Individual Stress Management services were provided. Stress: Identifies walking anywhere - causes fatigue and sometimes shortness of breath as primary stress. PHQ 9=  15 suggesting a moderately severe level of depression, including little interest or pleasure and low mood with occasional  thoughts that it would be OK not to wake up, but has not thought of hurting herself, has difficulty falling and staying asleep, low energy, poor appetite, and feeling fidgety;  started Zoloft 75 mg daily about one month ago; is tolerating without side effects; discussed onset of action and likelihood that she will begin to see additional benefits over the next few months; has first outpatient, online psychotherapy appointment scheduled for March 8 GAD= 9 suggesting a moderate level of anxiety including worry, difficulty relaxing, feeling restless, easily annoyed, and fearful that something else bad will happen. ESSI= 22 indicating a high level of social support including family, friends, and a few co-workers   INTERVENTIONS: Reviewed results of PHQ. 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 Stated Goal to improve walking so she can switch from walker to cane. Has home exercise plan from PT and goes to park to exercise with her brothers. Excellent attendance in cardiac rehab, so very motivated to improve functional status. Sleep difficulties started following skin graft under R armpit (DFA and DSA). Uses an app for sleep that a friend told her about. Recognizes racing heart when feeling distressed, and uses mindful breathing. Suggested she view Stress Management classes 1 and 4 to to help with these tools. Appreciates the support of family and friends, boss who welcomes her back to work when her health permits. 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 Follow up: Available while in cardiac rehab. Time: 70 minutes Per Covid 19 protocol for safety, CR Individual Stress Management services were provided. Stress: Identifies walking anywhere - causes fatigue and sometimes shortness of breath as primary stress. PHQ 9=  15 suggesting a moderately severe level of depression, including little interest or pleasure and low mood with occasional thoughts that it would be OK not to wake up, but has not thought of hurting herself, has difficulty falling and staying asleep, low energy, poor appetite, and feeling fidgety;  started Zoloft 75 mg daily about one month ago; is tolerating without side effects; discussed onset of action and likelihood that she will begin to see additional benefits over the next few months; has first outpatient, online psychotherapy appointment scheduled for March 8 GAD= 9 suggesting a moderate level of anxiety including worry, difficulty relaxing, feeling restless, easily annoyed, and fearful that something else bad will happen. ESSI= 22 indicating a high level of social support including family, friends, and a few co-workers   INTERVENTIONS: Reviewed results of PHQ. 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 Stated Goal to improve walking so she can switch from walker to cane. Has home exercise plan from PT and goes to park to exercise with her brothers. Excellent attendance in cardiac rehab, so very motivated to improve functional status. Sleep difficulties started following skin graft under R armpit (DFA and DSA). Uses an app for sleep that a friend told her about. Recognizes racing heart  when feeling distressed, and uses mindful breathing. Suggested she view Stress Management classes 1 and 4 to to help with these tools. Appreciates the support of family and friends, boss who welcomes her back to work when her health permits. 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 Follow up: Available while in cardiac rehab. Time: 70 minutes Per Covid 19 protocol for safety, CR Individual Stress Management services were provided. Stress: Identifies walking anywhere - causes fatigue and sometimes shortness of breath as primary stress. PHQ 9=  15 suggesting a moderately severe level of depression, including little interest or pleasure and low mood with occasional thoughts that it would be OK not to wake up, but has not thought of hurting herself, has difficulty falling and staying asleep, low energy, poor appetite, and feeling fidgety;  started Zoloft 75 mg daily about one month ago; is tolerating without side effects; discussed onset of action and likelihood that she will begin to see additional benefits over the next few months; has first outpatient, online psychotherapy appointment scheduled for March 8 GAD= 9 suggesting a moderate level of anxiety including worry, difficulty relaxing, feeling restless, easily annoyed, and fearful that something else bad will happen. ESSI= 22 indicating a high level of social support including family, friends, and a few co-workers   INTERVENTIONS: Reviewed results of PHQ. 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 Stated Goal to improve walking so she can switch from walker to cane. Has home exercise plan from PT and goes to park to exercise with her brothers. Excellent attendance in cardiac rehab, so very motivated to improve functional status. Sleep difficulties started following skin graft under R armpit (DFA and DSA). Uses an app for sleep that a friend told her about. Recognizes racing heart when feeling distressed, and uses mindful breathing. Suggested she view Stress Management classes 1 and 4 to to help with these tools. Appreciates the support of family and friends, boss who welcomes her back to work when her health permits. 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 Follow up: Available while in cardiac rehab. Time: 70 minutes Per Covid 19 protocol for safety, CR Individual Stress Management services were provided. Stress: Identifies walking anywhere - causes fatigue and sometimes shortness of breath as primary stress. PHQ 9=  15 suggesting a moderately severe level of depression, including little interest or pleasure and low mood with occasional thoughts that it would be OK not to wake up, but has not thought of hurting herself, has difficulty falling and staying asleep, low energy, poor appetite, and feeling fidgety;  started Zoloft 75 mg daily about one month ago; is tolerating without side effects; discussed onset of action and likelihood that she will begin to see additional benefits over the next few months; has first outpatient, online psychotherapy appointment scheduled for March 8 GAD= 9 suggesting a moderate level of anxiety including worry, difficulty relaxing, feeling restless, easily annoyed, and fearful that something else bad will happen. ESSI= 22 indicating a high level of social support including family, friends, and a few co-workers   INTERVENTIONS: Reviewed results of PHQ. Has experienced  many losses and grief since onset of juvenile diabetes - heart attack seemed to precipitate the depression by her description (tearful for hours, sad, hopeless, frustrated and angry at repeated infections, foot drop, now heart attack); Benefits from talking about her feelings and health issues and may do very well with psychotherapy. Stated Goal to improve walking so she can switch from walker to cane. Has home exercise plan from PT and goes to park to exercise with her brothers. Excellent attendance in cardiac rehab, so very motivated to improve functional status. Sleep difficulties started following skin graft under R armpit (DFA and DSA). Uses an app for sleep that a friend told her about. Recognizes racing heart when feeling distressed, and uses mindful breathing. Suggested she view Stress Management classes 1 and 4 to to help with these tools. Appreciates the support of family and friends, boss who welcomes her back to work when her health permits. 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 Follow up: Available while in cardiac rehab. Time: 70 minutes Assessment scores have improved since beginning cardiac rehab.  She has good social support.       PSYCHOSOCIAL EDUCATION CLASSES:  PSYCHOSOCIAL EDUCATION 01/20/2020 02/15/2020 03/07/2020 03/10/2020 07/18/2020 08/08/2020   Psychosocial Education Stress management Relaxation Techniques Other One to One Consult Other Other   Attend date 01/20/2020 02/18/2020 03/10/2020 03/10/2020 07/18/2020 08/08/2020   Other comments MyChart Videos [facility] Chart [facility] Chart Individual stress management meeting Meditation Mychart (No Data)         OTHER CORE COMPONENTS    BLOOD PRESSURE:  BLOOD PRESSURE 01/31/2020 02/14/2020 03/13/2020 04/03/2020 06/27/2020 07/26/2020 08/25/2020   Other Goals Patient will maintain appropriate blood pressure readings less than 130/80 Patient will maintain appropriate blood pressure readings less than 130/80 Patient will maintain appropriate blood pressure readings less than 130/80 Patient will maintain appropriate blood pressure readings less than 130/80 Patient will maintain appropriate blood pressure readings less than 130/80 Patient will maintain appropriate blood pressure readings less than 130/80 Patient will maintain appropriate blood pressure readings less than 130/80   Interventions Monitor blood pressure at each session;Educate on importance of taking prescribed blood pressure medication correctly;Educate on importance of regular exercise program;Educate on importance of maintaining appropriate diet and weight Monitor blood pressure at each session;Educate on importance of taking prescribed blood pressure medication correctly;Educate on importance of regular exercise program;Educate on importance of maintaining appropriate diet and weight Monitor blood pressure at each session;Educate on importance of taking prescribed blood pressure medication correctly;Educate on importance of regular exercise program;Educate on importance of maintaining appropriate diet and weight Monitor blood pressure at each session;Educate on importance of taking prescribed blood pressure medication correctly;Educate on importance of regular exercise program;Educate on importance of maintaining appropriate diet and weight Monitor blood pressure at each session;Educate on importance of taking prescribed blood pressure medication correctly;Educate on importance of regular exercise program;Educate on importance of maintaining appropriate diet and weight Monitor blood pressure at each session;Educate on importance of taking prescribed blood pressure medication correctly;Educate on importance of regular exercise program;Educate on importance of maintaining appropriate diet and weight Monitor blood pressure at each session;Educate on importance of taking prescribed blood pressure medication correctly;Educate on importance of regular exercise program;Educate on importance of maintaining appropriate diet and weight   Progress towards goal In progress;Verbalizes understanding In progress;Verbalizes understanding In progress;Verbalizes understanding In progress;Verbalizes understanding In progress;Verbalizes understanding In progress;Verbalizes understanding Verbalizes understanding;Yes     Most recent BP:  122/70    DIABETES:  DIABETES 01/31/2020 02/14/2020 03/13/2020 04/03/2020 06/27/2020 07/26/2020 08/25/2020   Other Goals Patient will maintain an acceptable blood sugar range Patient will maintain an acceptable blood sugar range Patient will maintain an acceptable blood sugar range Patient will maintain an acceptable blood sugar range Patient will maintain an acceptable blood sugar range Patient will maintain an acceptable blood sugar range Patient will maintain an acceptable blood sugar range   Interventions Educate patient on compliance with home blood sugar monitoring as prescribed by physician;Educate patient on signs and symptoms of hypo- and hyperglycemia;Instruct patient to monitor blood sugar before and after exercise;Referral to Diabetes Education Educate patient on compliance with home blood sugar monitoring as prescribed by physician;Educate patient on signs and symptoms of hypo- and hyperglycemia;Instruct patient to monitor blood sugar before and after exercise;Referral to Diabetes Education Educate patient on compliance with home blood sugar monitoring as prescribed by physician;Educate patient on signs and symptoms of hypo- and hyperglycemia;Instruct patient to monitor blood sugar before and after exercise;Referral to Diabetes Education Educate patient on compliance with home blood sugar monitoring as prescribed by physician;Educate patient on signs and symptoms of hypo- and hyperglycemia;Instruct patient to monitor blood sugar before and after exercise;Referral to Diabetes Education Educate patient on compliance with home blood sugar monitoring as prescribed by physician;Educate patient on signs and symptoms of hypo- and hyperglycemia;Instruct patient to monitor blood sugar before and after exercise;Referral to Diabetes Education Educate patient on compliance with home blood sugar monitoring as prescribed by physician;Educate patient on signs and symptoms of hypo- and hyperglycemia;Instruct patient to monitor blood sugar before and after exercise;Referral to Diabetes Education Educate patient on compliance with home blood sugar monitoring as prescribed by physician;Educate patient on signs and symptoms of hypo- and hyperglycemia;Instruct patient to monitor blood sugar before and after exercise;Referral to Diabetes Education   HbA1c 11.5 11.5 11.5 11.5 11.5 11.5 11.5   Blood sugar 283 283 283 283 283 [id number] 283   Check BS at home? Yes Yes Yes Yes Yes Yes Yes   Progress towards goal In progress;Verbalizes understanding In progress;Verbalizes understanding In progress;Verbalizes understanding In progress;No In progress;No In progress;No In progress;Yes;Verbalizes understanding       HEART FAILURE:  HEART FAILURE 01/31/2020 02/14/2020 03/13/2020 04/03/2020 06/27/2020 07/26/2020 08/25/2020   Patient goal improve energy level improve energy level improve energy level improve energy level improve energy level improve energy level improve energy level   Other Goals Patient will document daily weight;Improve activity level and ability for self-care Patient will document daily weight;Improve activity level and ability for self-care Patient will document daily weight;Improve activity level and ability for self-care Patient will document daily weight;Improve activity level and ability for self-care Patient will document daily weight;Improve activity level and ability for self-care Patient will document daily weight;Improve activity level and ability for self-care Patient will document daily weight;Improve activity level and ability for self-care   Interventions Educate the patient on importance of daily weight documentation;Educate the patient the role of diuretics and rationale for compliance and monitor for side effects;Encourage adequate fluid intake within fluid restrictions as ordered by MD;Educate on low sodium foods and beverages and the importance of compliance with all ordered diet and fluid restrictions;Monitor and educate for signs/symptoms of volume overload such as edema, increased weight, and/or shortness of breath Educate the patient on importance of daily weight documentation;Educate the patient the role of diuretics and rationale for compliance and monitor for side effects;Encourage adequate fluid intake within fluid restrictions as ordered by  MD;Educate on low sodium foods and beverages and the importance of compliance with all ordered diet and fluid restrictions;Monitor and educate for signs/symptoms of volume overload such as edema, increased weight, and/or shortness of breath Educate the patient on importance of daily weight documentation;Educate the patient the role of diuretics and rationale for compliance and monitor for side effects;Encourage adequate fluid intake within fluid restrictions as ordered by MD;Educate on low sodium foods and beverages and the importance of compliance with all ordered diet and fluid restrictions;Monitor and educate for signs/symptoms of volume overload such as edema, increased weight, and/or shortness of breath Educate the patient on importance of daily weight documentation;Educate the patient the role of diuretics and rationale for compliance and monitor for side effects;Encourage adequate fluid intake within fluid restrictions as ordered by MD;Educate on low sodium foods and beverages and the importance of compliance with all ordered diet and fluid restrictions;Monitor and educate for signs/symptoms of volume overload such as edema, increased weight, and/or shortness of breath Educate the patient on importance of daily weight documentation;Educate the patient the role of diuretics and rationale for compliance and monitor for side effects;Encourage adequate fluid intake within fluid restrictions as ordered by MD;Educate on low sodium foods and beverages and the importance of compliance with all ordered diet and fluid restrictions;Monitor and educate for signs/symptoms of volume overload such as edema, increased weight, and/or shortness of breath Educate the patient on importance of daily weight documentation;Educate the patient the role of diuretics and rationale for compliance and monitor for side effects;Encourage adequate fluid intake within fluid restrictions as ordered by MD;Educate on low sodium foods and beverages and the importance of compliance with all ordered diet and fluid restrictions;Monitor and educate for signs/symptoms of volume overload such as edema, increased weight, and/or shortness of breath Educate the patient on importance of daily weight documentation;Educate the patient the role of diuretics and rationale for compliance and monitor for side effects;Encourage adequate fluid intake within fluid restrictions as ordered by MD;Educate on low sodium foods and beverages and the importance of compliance with all ordered diet and fluid restrictions;Monitor and educate for signs/symptoms of volume overload such as edema, increased weight, and/or shortness of breath   Type of Heart Failure Chronic diastolic (congestive) heart failure Chronic diastolic (congestive) heart failure Chronic diastolic (congestive) heart failure Chronic diastolic (congestive) heart failure Chronic diastolic (congestive) heart failure Chronic diastolic (congestive) heart failure Chronic diastolic (congestive) heart failure   EF % 55 55 55 55 55 55 55   NYHA Class 1 1 1 1 1 1 1    Progress towards goal In progress;Verbalizes understanding In progress;Verbalizes understanding In progress;Verbalizes understanding No No No No       MEDICATION COMPREHENSION:  MEDICATION COMPREHENSION 01/31/2020 02/14/2020 03/13/2020 04/03/2020 06/27/2020 07/26/2020 08/25/2020   Other Goals Patient will demonstrate knowledge of importance of taking meds as evidenced by verbalizing understanding of class, dosage, frequency and side effects. Patient will demonstrate knowledge of importance of taking meds as evidenced by verbalizing understanding of class, dosage, frequency and side effects. Patient will demonstrate knowledge of importance of taking meds as evidenced by verbalizing understanding of class, dosage, frequency and side effects. Patient will demonstrate knowledge of importance of taking meds as evidenced by verbalizing understanding of class, dosage, frequency and side effects. Patient will demonstrate knowledge of importance of taking meds as evidenced by verbalizing understanding of class, dosage, frequency and side effects. Patient will demonstrate knowledge of importance of taking meds as evidenced by verbalizing understanding of class, dosage,  frequency and side effects. Patient will demonstrate knowledge of importance of taking meds as evidenced by verbalizing understanding of class, dosage, frequency and side effects.   Interventions Educate the patient on prescription and importance of time of day taken;Educate the patient on the class of medication, action and side effects of medication;Identify reasons for poor compliance with meds and discuss alternative options;Review dosage and frequency of medication with the patient Educate the patient on prescription and importance of time of day taken;Educate the patient on the class of medication, action and side effects of medication;Identify reasons for poor compliance with meds and discuss alternative options;Review dosage and frequency of medication with the patient Educate the patient on prescription and importance of time of day taken;Educate the patient on the class of medication, action and side effects of medication;Identify reasons for poor compliance with meds and discuss alternative options;Review dosage and frequency of medication with the patient Educate the patient on prescription and importance of time of day taken;Educate the patient on the class of medication, action and side effects of medication;Identify reasons for poor compliance with meds and discuss alternative options;Review dosage and frequency of medication with the patient Educate the patient on prescription and importance of time of day taken;Educate the patient on the class of medication, action and side effects of medication;Identify reasons for poor compliance with meds and discuss alternative options;Review dosage and frequency of medication with the patient Educate the patient on prescription and importance of time of day taken;Educate the patient on the class of medication, action and side effects of medication;Identify reasons for poor compliance with meds and discuss alternative options;Review dosage and frequency of medication with the patient Educate the patient on prescription and importance of time of day taken;Educate the patient on the class of medication, action and side effects of medication;Identify reasons for poor compliance with meds and discuss alternative options;Review dosage and frequency of medication with the patient   Medications Understood Yes Yes Yes Yes Yes Yes Yes   Meds taken % of time 95 95 95 95 95 95 95   Progress towards goal In progress;Verbalizes understanding In progress;Verbalizes understanding In progress;Verbalizes understanding In progress In progress In progress Yes;Verbalizes understanding       TOBACCO CESSATION:  TOBACCO CESSATION 01/31/2020 02/14/2020 03/13/2020 04/03/2020 06/27/2020 07/26/2020 08/25/2020   Patient goal never smoker never smoker never smoker never smoker never smoker never smoker never smoker       Social History     Tobacco Use   Smoking Status Current Some Day Smoker   ??? Types: Cigars   Smokeless Tobacco Never Used   Tobacco Comment    Black milds       OTHER CORE COMPONENTS COMMENTS:  OTHER COMMENTS 01/31/2020 02/14/2020 03/13/2020 04/03/2020 06/27/2020 07/26/2020 08/25/2020   Other comments Pamela Gutierrez came in today for her evaluation for CARDIAC REHAB. She was oriented to CARDIAC REHAB and the [facility]. We discussed signs and symptoms that he should report while in CARDIAC REHAB. We also reviewed the precautions we are taking to prevent the spread of Covid here at the [facility] Pamela Gutierrez tested positive for covid 11 days ago and she is asymptomatic, and has quarantined for 10 days. She uses a walker because she has foot drop bilaterally. She has a high A1C and she checks her blood sugar 3-4 times per day. She states that she takes her meds as prescribed 95% of the time. Her goal for CARDIAC REHAB is to get her energy back. She carries her  NTG with her, and has only needed to use it once. Pamela Gutierrez will join the 8:15 class on 01/28/20. Pamela Gutierrez came in today for her evaluation for CARDIAC REHAB. She was oriented to CARDIAC REHAB and the [facility]. We discussed signs and symptoms that he should report while in CARDIAC REHAB. We also reviewed the precautions we are taking to prevent the spread of Covid here at the [facility]. Pamela Gutierrez tested positive for covid 11 days ago and she is asymptomatic, and has quarantined for 10 days. She uses a walker because she has foot drop bilaterally. She has a high A1C and she checks her blood sugar 3-4 times per day. She states that she takes her meds as prescribed 95% of the time. Her goal for CARDIAC REHAB is to get her energy back. She carries her NTG with her, and has only needed to use it once. Pamela Gutierrez will join the 8:15 class on 01/28/20. Pamela Gutierrez is progressing in CR. She has missed several sessions due to GI problems. Her BP is WNL. She manages her blood sugar with diet and medication and exercise. She weighs daily. she states that she takes her meds as prescribed. She receives education modules via MyChart. Pamela Gutierrez has missed several sessions due to other health issues. we will discharge her now, and will he happy to have her return when her other issues allow. Pamela Gutierrez has missed several sessions due to other health issues. we will discharge her now, and will he happy to have her return when her other issues allow. Pamela Gutierrez is making good progress in CR. Her BP is WNL. She manages her blood sugar with diet, exercise and medications. She states that she takes her meds as prescribed. She weighs daily. Pamela Gutierrez completed cardiac rehab today.  She has progressed well and attended regularly since returning after her GI issues.  She has had no complaints of chest pain.  She has been monitored via telemetry with no noted changes to EKG during exercise.  She has met her goals and has received education videos weekly through [place].       OTHER CORE COMPONENTS EDUCATION CLASSES:  OTHER CORE COMPONENT EDUCATION 03/02/2020 07/11/2020 08/01/2020   Core Component Ed Medications Heart Anatomy Medications   Attend Date 03/03/2020 07/11/2020 08/01/2020   Other Comments [facility] Chart Mychart Mychart           Signed:  Moclips Callas, RN  08/25/2020 10:02 AM

## 2020-08-25 NOTE — Unmapped (Signed)
Pamela Gutierrez graduated from cardiac rehab today, completing 36 supervised exercise sessions.  Patient has progressed well and attended regularly. She plans to exercise on her own by exercising at a local gym with her brother. Patient has met education goals and individual goals by attending CR regularly and adopting heart healthy lifestyle changes.  Patient has been monitored via telemetry with no noted changes to EKG during exercise. She has had no complaints of chest pain. Patient has been given a tshirt, theraband and exercise manual. She has been given a new THRR and exercise prescription.  Patient demonstrates understanding of the new exercise prescription, RPE scale and s/sx to report to physician.

## 2020-08-25 NOTE — Unmapped (Signed)
I reviewed the individualized treatment plan as outlined by Jeanella Flattery, RN; I agree with her note outlining the plan.    Chong Sicilian, MD
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
## 2020-09-01 ENCOUNTER — Ambulatory Visit: Admit: 2020-09-01 | Discharge: 2020-09-02 | Payer: PRIVATE HEALTH INSURANCE

## 2020-09-01 DIAGNOSIS — M6220 Nontraumatic ischemic infarction of muscle, unspecified site: Principal | ICD-10-CM

## 2020-09-01 DIAGNOSIS — I2510 Atherosclerotic heart disease of native coronary artery without angina pectoris: Principal | ICD-10-CM

## 2020-09-01 NOTE — Unmapped (Signed)
Pt has not complained of heart burn recently.  She is complaining of right leg pain.  She is walking with her rolling walker today.  FSBS are better than they were.  She was seen in ER 08/18 for a near syncopal episode . Her BS was elevated at home and upon arrival to ER she was diaiphoretic.  Pacer Interrogated in ER with some SB episodes.

## 2020-09-05 ENCOUNTER — Ambulatory Visit
Admit: 2020-09-05 | Discharge: 2020-09-06 | Payer: PRIVATE HEALTH INSURANCE | Attending: Student in an Organized Health Care Education/Training Program | Primary: Student in an Organized Health Care Education/Training Program

## 2020-09-05 DIAGNOSIS — Z794 Long term (current) use of insulin: Principal | ICD-10-CM

## 2020-09-05 DIAGNOSIS — E118 Type 2 diabetes mellitus with unspecified complications: Principal | ICD-10-CM

## 2020-09-05 MED ORDER — TRESIBA FLEXTOUCH U-100 INSULIN 100 UNIT/ML (3 ML) SUBCUTANEOUS PEN
11 refills | 0.00000 days | Status: CP
Start: 2020-09-05 — End: ?

## 2020-09-05 MED ORDER — FREESTYLE LIBRE 2 READER
2 refills | 0 days | Status: CP
Start: 2020-09-05 — End: ?

## 2020-09-05 MED ORDER — METFORMIN ER 500 MG TABLET,EXTENDED RELEASE 24 HR
ORAL_TABLET | Freq: Two times a day (BID) | ORAL | 3 refills | 90 days | Status: CP
Start: 2020-09-05 — End: 2021-08-31

## 2020-09-05 NOTE — Unmapped (Addendum)
Thank you for coming in to see [place] today!    Today we discussed your diabetes.    - For now continue Tresiba 50 units every night.  - Check your blood sugar on your [facility] Reader every morning when you wake up (this is called your fasting blood sugar). Adjust your Evaristo Bury by 2 units every 3-4 days to keep your fasting blood sugar 100-150. If it is consistently above 150, increase by 2 units every 3-4 days, up to a maximum of 64 units. If it is consistently below 100, decrease by 2 units every 3-4 days.  - For your Humalog, give 1 unit for every 9 grams of carbs. In addition to this, you should add:  - 1 unit if your pre-meal blood sugar is 150-200  - 2 units if your pre-meal blood sugar is 201-250  - 3 units if your pre-meal blood sugar is 251-300  - 4 units if your pre-meal blood sugar is 301-350  - 5 units if your pre-meal blood sugar is 351+    - Subtract 3 units from your Humalog dose if you plan to work out right after you eat.  - I have sent a prescription for your [facility] reader to your pharmacy. Once you get it we will see you in clinic to link it with our clinic and go over your blood sugars.

## 2020-09-05 NOTE — Unmapped (Signed)
Meter uploaded to glooko. POC glucose done today. PP 10:00am. 252 mg/dL.

## 2020-09-05 NOTE — Unmapped (Signed)
Endocrinology Clinic Follow Up Visit    ASSESSMENT AND PLAN:     1. 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 Diet has improved off Ozempic that was stopped due to gastroparesis--not checking BGs regularly and has not been able to get Freestyle Reader. Goal HbA1c 7.0%. GAD and islet cell ab negative. C-peptide 1.56 with BG 219 (02/2020).    - Likely needs increase in Tresiba dose, currently taking 50 units at bedtime, advised once she starts checking her BGs regularly/has access to [employer] data to self-titrate Tresiba by 1-2 units every 3-4 days to keep BG 100-150s, up to a maximum of 64 units  - Tighten Humalog CR to 1 : 9  - Increase Metformin to 1gm BID, if tolerated  - Sent prescription for [facility] Reader; discussed she should reach out if any issues getting it  - RTC with Cathrine Muster, CDE in 3-4 weeks once she has reader to discuss further insulin adjustments and RTC with me in 2 mos  - She has no plans for pregnancy at this time and is on OCPs; have previously discussed it would be best for her to optimize her BG control prior to pregnancy    Diabetes-related complications:  - Retinopathy: (+) last eye exam 05/2020 with DR  - Neuropathy: (+) foot exam completed in 11/2019, discussed proper foot care  - Nephropathy: Last urine MA/C normal in 12/2019 and GFR normal in 11/2019  > on Losartan 25mg   - Lipids/ASCVD risk: (+) CAD/STEMI in 11/2019, last lipids with LDL 138 in 06/2020  > on Atorvastatin 80mg  and Repatha 140mg  every 2 weeks, being managed by PCP and Cardiology  - Immunizations: S/p COVID vaccine    Patient was seen and staffed with Dr. Maple Hudson.    Arelia Longest, M.D. PGY-5 Endocrinology Fellow  [facility] Endocrinology at [place]  Phone:  [phone]     Fax: [phone]    SUBJECTIVE:     Pamela Gutierrez is a 30 y.o. 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    Regarding her diabetes, she was diagnosed at age 18. She is currently on Tresiba 50 units at bedtime, Humalog CR 1:10 with meals, and Metformin 500mg  BID. She has checked her BGs only twice over the past month, both were >300s. She has [employer] supplies but does not have a reader and her phone is not compatible. She has had rare symptoms of hypoglycemia that were later attributed to arrhythmias.     She eats 3 meals a day. She admits to occasionally forgetting to take Humalog with lunch although she brings her pens to work. She always takes her Humalog with breakfast and dinner and  She does not have any blurry vision but has numbness and tingling. She is currently on Atorvastatin and Repatha.    She was admitted for CAD/STEMI in 11/2019; she also has a history of HTN but no CVA. There is family history of multiple MIs in her grandmother and her father had gangrene of his lower extremity related to diabetes.     Medical History Surgical History   Past Medical History:   Diagnosis Date   ??? Allergic    ??? Anemia    ???  Asthma    ??? Eczema    ??? Gastroparesis    ??? GERD (gastroesophageal reflux disease)    ??? Hyperlipidemia    ??? Hypertension    ??? Murmur, cardiac    ??? Obesity    ??? 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 Type 2 diabetes mellitus (CMS-HCC)     BS- 90-130      Past Surgical History:   Procedure Laterality Date   ??? ABLATION OF DYSRHYTHMIC FOCUS     ??? CARDIAC CATHETERIZATION     ??? CHOLECYSTECTOMY  2015   ??? CORONARY STENT PLACEMENT     ??? ELBOW FRACTURE SURGERY     ??? PR CATH PLACE/CORON ANGIO, IMG SUPER/INTERP,W LEFT HEART VENTRICULOGRAPHY N/A 11/12/2019    Procedure: Left Heart Catheterization;  Surgeon: Alvira Philips, MD;  Location: [facility] CATH;  Service: Cardiology   ??? PR COMPRE EP EVAL ABLTJ 3D MAPG TX SVT N/A 06/04/2019    Procedure: Accessory Pathway Ablation;  Surgeon: Joretta Bachelor, MD;  Location: [facility] EP;  Service: Cardiology   ??? PR ELECTROPHYS EV,R A-V PACE/REC,W/O INDUCT N/A 06/04/2019    Procedure: Comprehensive Study W IND;  Surgeon: Joretta Bachelor, MD;  Location: [facility] EP;  Service: Cardiology   ??? PR EXC SWEAT GLAND LESN AXILL,SIMPL Right 10/30/2015    Procedure: RIGHT AXILLAE EXCISE HIDRADENITIS;  Surgeon: Oren Section, MD;  Location: [facility] OR HPR;  Service: Plastics   ??? PR NEGATIVE PRESSURE WOUND THERAPY DME </= 50 SQ CM N/A 10/30/2015    Procedure: WOUND VAC PLACEMENT;  Surgeon: Oren Section, MD;  Location: [facility];  Service: Plastics   ??? PR SPLIT GRFT TRUNK,ARM,LEG <100 SQCM N/A 10/30/2015    Procedure: SPLIT-THICKNESS SKIN GRAFT;  Surgeon: Oren Section, MD;  Location: [facility];  Service: Plastics          Social History Family History   Social History     Tobacco Use   ??? Smoking status: Current Some Day Smoker     Types: Cigars   ??? Smokeless tobacco: Never Used   ??? Tobacco comment: Black milds   Substance Use Topics   ??? Alcohol use: Yes     Alcohol/week: 1.0 standard drink     Types: 1 Shots of liquor per week     Comment: occasional      Family History   Problem Relation Age of Onset   ??? Diabetes Mother    ??? Diabetes Father    ??? Heart disease Maternal Grandmother    ??? Diabetes Maternal Grandmother    ??? Heart disease Paternal Grandmother    ??? Melanoma Neg Hx    ??? Basal cell carcinoma Neg Hx    ??? Squamous cell carcinoma Neg Hx           Medications     Current Outpatient Medications:   ???  acetaminophen (TYLENOL 8 HOUR) 650 MG CR tablet, Take 650 mg by mouth nightly., Disp: , Rfl:   ???  acyclovir (ZOVIRAX) 400 MG tablet, TAKE 1 TABLET(400 MG) BY MOUTH THREE TIMES DAILY, Disp: 90 tablet, Rfl: 0  ??? albuterol HFA 90 mcg/actuation inhaler, Inhale 2 puffs every four (4) hours as needed. , Disp: , Rfl:   ???  amoxicillin-clavulanate (AUGMENTIN) 875-125 mg per tablet, Take twice daily, Disp: 60 tablet, Rfl: 2  ???  aspirin 81 MG chewable tablet, Chew 1 tablet (81 mg total) daily. (Patient taking differently: Dorna Bloom  81 mg nightly.), Disp: 30 tablet, Rfl: 11  ???  atorvastatin (LIPITOR) 80 MG tablet, Take 1 tablet (80 mg total) by mouth daily., Disp: 90 tablet, Rfl: 1  ???  BELSOMRA 10 mg tablet, TAKE 1 TABLET(10 MG) BY MOUTH EVERY NIGHT, Disp: 90 tablet, Rfl: 1  ???  blood sugar diagnostic (GLUCOSE BLOOD) Strp, Test once daily and as directed.one touch verio flex testing strips, Disp: 100 strip, Rfl: 3  ???  cetirizine 10 mg cap, Take 10 mg by mouth daily as needed. , Disp: , Rfl:   ???  clindamycin (CLEOCIN T) 1 % lotion, Once daily to body areas that are typically affected, Disp: 60 mL, Rfl: 11  ???  clobetasoL (TEMOVATE) 0.05 % ointment, Apply topically Two (2) times a day. As needed for flares up to a week at a time, Disp: 60 g, Rfl: 3  ???  cyclobenzaprine (FLEXERIL) 10 MG tablet, TAKE 1 TABLET(10 MG) BY MOUTH EVERY NIGHT, Disp: 90 tablet, Rfl: 1  ???  empty container (SHARPS-A-GATOR DISPOSAL SYSTEM) Misc, Use as directed for sharps disposal, Disp: 1 each, Rfl: 2  ???  evolocumab (REPATHA SYRINGE) 140 mg/mL Syrg, Inject the contents of one pen (140 mg) under the skin every fourteen (14) days., Disp: 6 mL, Rfl: 3  ???  ferrous sulfate 325 (65 FE) MG tablet, Take by mouth. Once daily PRN, Disp: , Rfl:   ???  flash glucose scanning reader (FLASH GLUCOSE SCANNING READER), by Other route Take as directed. Dispense Libre 2 scanning reader, use as directed, Disp: 1 each, Rfl: 2  ???  flash glucose sensor (FLASH GLUCOSE SENSOR) kit, by Other route every fourteen (14) days. Dispense Libre 2 sensors, either 1 mo supply or 3 mo supply, as per patient preference; change q 14 days, Disp: 6 each, Rfl: 0  ???  furosemide (LASIX) 40 MG tablet, TAKE 1 TABLET BY MOUTH DAILY AS NEEDED FOR> 2 POUND WEIGHT GAIN IN A 24 HOUR PERIOD (Patient taking differently: 40 mg nightly.), Disp: 90 tablet, Rfl: 1  ???  gabapentin (NEURONTIN) 300 MG capsule, Take 300 mg (1 capsule) in the morning, 300 mg at noon, and 600 mg (2 capsules) in the evening., Disp: 360 capsule, Rfl: 1  ???  HYDROcodone-acetaminophen (NORCO) 5-325 mg per tablet, Take 1 tablet every 6 hours as needed for pain, Disp: 15 tablet, Rfl: 0  ???  hydrOXYzine (ATARAX) 25 MG tablet, TAKE 1 TABLET(25 MG) BY MOUTH EVERY 8 HOURS AS NEEDED, Disp: 90 tablet, Rfl: 1  ???  insulin ASPART (NOVOLOG FLEXPEN) 100 unit/mL (3 mL) injection pen, Inject 0.06 mL (6 Units total) under the skin Three (3) times a day before meals. Take 5 units three times per day with meals (2 units with snacks/small meals), plus 2 extra for every 50 points over 150. Up to 50 units per day., Disp: 15 mL, Rfl: 12  ???  insulin needles, disposable, (BD ULTRA-FINE NANO PEN NEEDLES) 32 x 5/32  Ndle, Inject 1 each under the skin Five (5) times a day. Use 5x/day with Lantus, Novolog, and Victoza pens., Disp: 200 each, Rfl: 12  ???  insulin syringe-needle U-100 (BD INSULIN SYRINGE ULT-FINE II) 0.5 mL 31 gauge x 5/16 Syrg, ok to sub any brand or size insulin syringe preferred by insurance/patient, use 5x/day, dx E11.65, Disp: 200 each, Rfl: 5  ???  insulin syringe-needle U-100 0.3 mL 31 x 3/8 Syrg, For use with Humulin R AC TID., Disp: 300 Syringe, Rfl: 3  ???  losartan (COZAAR)  25 MG tablet, TAKE 1 TABLET(25 MG) BY MOUTH DAILY, Disp: 90 tablet, Rfl: 3  ???  metoclopramide (REGLAN) 10 MG tablet, Take 1 tablet (10 mg total) by mouth four (4) times a day as needed. Take 1 tablet 4 times daily as neeed, Disp: 120 tablet, Rfl: 3  ???  metoprolol succinate (TOPROL-XL) 100 MG 24 hr tablet, Take 0.5 tablets (50 mg total) by mouth every morning., Disp: 90 tablet, Rfl: 1  ???  montelukast (SINGULAIR) 10 mg tablet, Take 10 mg by mouth daily as needed. , Disp: , Rfl:   ???  nitroglycerin (NITROSTAT) 0.4 MG SL tablet, Place 1 tablet (0.4 mg total) under the tongue every five (5) minutes as needed for chest pain. Maximum of 3 doses in 15 minutes., Disp: 25 tablet, Rfl: 0  ???  NORLYDA 0.35 mg tablet, TAKE 1 TABLET(0.35 MG) BY MOUTH DAILY, Disp: 84 tablet, Rfl: 3  ???  omeprazole (PRILOSEC) 20 MG capsule, Take 20 mg by mouth daily., Disp: , Rfl:   ???  ondansetron (ZOFRAN-ODT) 4 MG disintegrating tablet, Take 1 tablet (4 mg total) by mouth every eight (8) hours as needed for nausea., Disp: 60 tablet, Rfl: 3  ???  prasugreL (EFFIENT) 10 mg tablet, Take 1 tablet (10 mg total) by mouth daily., Disp: 30 tablet, Rfl: 11  ???  promethazine (PHENERGAN) 25 MG tablet, TAKE 1 TABLET(25 MG) BY MOUTH EVERY 6 HOURS AS NEEDED FOR NAUSEA, Disp: 60 tablet, Rfl: 3  ???  sertraline (ZOLOFT) 50 MG tablet, Take 1.5 tablets (75 mg total) by mouth daily., Disp: 135 tablet, Rfl: 3  ???  simethicone 125 mg cap, Take 250 mg by mouth nightly., Disp: , Rfl:   ???  triamcinolone (KENALOG) 0.1 % cream, Apply topically once as needed. , Disp: , Rfl:   ???  amLODIPine (NORVASC) 10 MG tablet, Take 1 tablet (10 mg total) by mouth daily., Disp: 90 tablet, Rfl: 3  ???  blood-glucose meter kit, Currently has One Touch Ultra test strips. Please issue per her formulary., Disp: 1 each, Rfl: 1  ???  flash glucose scanning reader (FLASH GLUCOSE SCANNING READER), by Other route Take as directed. Dispense Libre 2 scanning reader, use as directed, Disp: 1 each, Rfl: 2  ???  insulin degludec (TRESIBA FLEXTOUCH U-100) 100 unit/mL (3 mL) InPn, Inject 50 units every night. Increase or decrease dose accordingly as instructed by MD. Maximum of 64 units every night., Disp: 21 mL, Rfl: 11  ???  metFORMIN (GLUCOPHAGE-XR) 500 MG 24 hr tablet, Take 2 tablets (1,000 mg total) by mouth two (2) times a day. With meals., Disp: 360 tablet, Rfl: 3  ???  spironolactone (ALDACTONE) 100 MG tablet, Take 1 tablet (100 mg total) by mouth daily., Disp: 90 tablet, Rfl: 1         Allergies Allergies   Allergen Reactions   ??? Lanolin Hives   ??? Morphine (Pf) Swelling   ??? Tomato Other (See Comments) and Swelling   ??? Venom-Honey Bee Swelling   ??? Wool Hives and Swelling   ??? Ibuprofen Other (See Comments)   ??? Nsaids (Non-Steroidal Anti-Inflammatory Drug) Other (See Comments)   ??? Iodinated Contrast Media Nausea And Vomiting and Rash   ??? Ioxaglate Sodium Nausea And Vomiting          Review of Systems: 10 systems reviewed and were negative except for as noted in HPI and below.    OBJECTIVE:     Physical Exam:  BP 141/102 Comment: AVG - Pulse  98  - Ht 166.4 cm (5' 5.5)  - Wt 78.2 kg (172 lb 6.4 oz)  - BMI 28.25 kg/m??   General: Well-appearing African-American female in no apparent distress  HEENT: EOMI, no exophthalmos, sclera anicteric, no thyromegaly  Cardiovascular: Regular rate and rhythm; no murmurs, rubs, or gallops; 2+ radial pulses  Respiratory: Clear to auscultation bilaterally; no increased work of breathing on RA  Abdominal: Soft, non-distended; no lipohypertrophy  MSK: Normal station and gait; no edema  Neuro: Awake, alert, and oriented; no focal deficits; no tremors  Psych: Normal mood and affect  Skin: No abnormal skin pigmentation, no acanthosis nigricans    Labs:  I personally reviewed labs available in Epic prior to the start of today's visit.    Labs are significant for:  Lab Results   Component Value Date    A1C 11.4 (H) 07/21/2020    A1C 12.2 (H) 05/15/2020    A1C 11.6 (H) 02/16/2020    A1C 11.5 (H) 11/12/2019    A1C 12.0 (H) 05/13/2019     Lab Results   Component Value Date    TRIG 65 11/12/2019    CHOL 110 02/21/2020    HDL 24 (L) 02/21/2020    LDL 137.6 06/21/2020     Lab Results   Component Value Date    TSH 1.160 08/24/2020     Lab Results   Component Value Date    CREATININE 0.59 (L) 08/24/2020    GFRNAAF >90 03/29/2020    ALBCRERAT 3.7 12/13/2019     Lab Results   Component Value Date    MALBCRERAT 10.8 03/25/2014     Lab Results   Component Value Date    CPEPTIDE 1.56 02/16/2020 Lab Results   Component Value Date    NA 133 (L) 08/24/2020    K 4.4 08/24/2020    CL 101 08/24/2020    CO2 27.8 08/24/2020    BUN 15 08/24/2020    CREATININE 0.59 (L) 08/24/2020    GFR >= 60 04/08/2012    AST <8 08/24/2020    PROT 6.7 08/24/2020    ALBUMIN 3.5 08/24/2020     Imaging:  I personally reviewed imaging available in Epic prior to the start of today's visit.    Imaging is significant for: Echocardiogram (11/2019):    1. The left ventricle is normal in size with mildly increased wall  thickness.    2. The left ventricular systolic function is normal with no obvious wall  motion abnormalities, LVEF is visually estimated at > 55%.    3. The right ventricle is normal in size, with normal systolic function.    4. IVC size and inspiratory change suggest mildly elevated right atrial  pressure. (5-10 mmHg).    I reviewed and summarized (above) records in preparation for today's visit all pertinent notes in Epic/Media and CareEverywhere as well as any sent records.

## 2020-09-08 NOTE — Unmapped (Signed)
I saw and evaluated the patient, participating in the key portions of the service.  I reviewed the resident/fellow’s note.  I agree with the resident/fellow’s findings and plan.   Lota Leamer Anne Ellyce Lafevers, MD, PhD  Associate Professor of Medicine  Division of Endocrinology  [phone]

## 2020-09-12 DIAGNOSIS — M6220 Nontraumatic ischemic infarction of muscle, unspecified site: Principal | ICD-10-CM

## 2020-09-15 ENCOUNTER — Ambulatory Visit
Admit: 2020-09-15 | Discharge: 2020-09-16 | Payer: PRIVATE HEALTH INSURANCE | Attending: Rehabilitative and Restorative Service Providers" | Primary: Rehabilitative and Restorative Service Providers"

## 2020-09-18 ENCOUNTER — Institutional Professional Consult (permissible substitution): Admit: 2020-09-18 | Discharge: 2020-09-18 | Payer: PRIVATE HEALTH INSURANCE

## 2020-09-18 DIAGNOSIS — Z4509 Encounter for adjustment and management of other cardiac device: Principal | ICD-10-CM

## 2020-09-18 NOTE — Unmapped (Signed)
AFO Delivery    Actions taken today     Devices provided:   Device Data    Base Device  Quantity: 2  Side: Bilateral  Description: Solid AFO with inner boot  Part Number: Gerrit Halls AFO  Serial Number: [id number]  Manufacturer: Other  Warranty: 90 Days  Action: Delivered      Adjustments made    Size Trimmed foot plate to size   Alignment None needed   Other Added instep strap bilaterally to hold the foot within the brace easier         Goals     Control foot drop  Control tibial progression  Reduce steppage gait  Provide ML stability to the ankle  Wear brace daily  Remain free from falls      Assessment   Assessment: Pamela Gutierrez was seen for fitting and delivery of bilateral solid AFOs due to foot drop. She came with a companion and used a rollator. Fit was generally unremarkable. We used a shoe in-house for fitting due to size of the braces. She normally wears a size 9W, and we needed to use a 10XW.    During ambulation with the braces, she had reduced steppage gait and improved to clearance. She tended to ride the heel of the left AFO. She denied discomfort.    Overall the fit and function was appropriate.    Follow up in 2-3 weeks is recommended to ensure good outcomes for gait.    Device checked for safety and security during the visit today.      Instructions provided:   [x]  Device purpose   [x]  How to put on, take off and wear the device  [x]  Cleaning / Maintenance  [x]  Risks / Benefits of wearing   [x]  Instructions to contact should issues arise      Plan    Return Visit:  2 weeks     Authorization expectations:  none     O&P Fabrication: none

## 2020-09-18 NOTE — Unmapped (Signed)
Complex Case Management  SUMMARY NOTE    Attempted to contact pt today at Home number to introduce Complex Case Management services. Left message to return call.; 1st attempt    Discuss at next visit: Introduction to Complex Case Management

## 2020-09-18 NOTE — Unmapped (Signed)
HOW DO I PUT ON MY AFO?    Apply a long sock before donning your AFO. It is important to make sure there are no wrinkles in the sock.  Slide your foot into the brace. Make sure the heel is all the way back and the foot is seated in the brace.   Tighten the strap in the instep of the shoe first to ensure security, then tighten the shin strap. Make sure the straps are snug.  Once the straps are fastened, slide your foot into the shoe. It is important to always wear a shoe with an AFO.  Fasten your shoes. It is important to make sure the top of the shoe is snug.    Some users find that it is easier to put the AFO in the shoe first, and then slide the foot into the AFO-shoe. As long as the straps or laces are appropriately secured, experiment to determine which way may be the most convenient and effective for you.    WHAT IS THE WEAR SCHEDULE FOR MY AFO?    Just like a new pair of shoes, an AFO also has a breaking in period.    Day 1: Wear the brace for 1 hour  Day 2: Wear the brace for 2 hours  Day 3. Wear the brace for 4 hours  Day 4: Wear the brace for 8 hours    Continue this wear schedule until you reach full day use.    If your AFO is causing any redness on the skin and it does not disappear within 20 minutes, please contact your orthotist to schedule an adjustment.    Please remember to always wear your shoe with your AFO for proper support.    HOW DO I CLEAN MY AFO?  Spray rubbing alcohol on inside and outside of your AFO and wipe it down with a cloth to remove any oils or residue.  It is important to clean any foam padding about once a week to prevent build-up.  Always check the integrity of the Velcro straps and check for build-ups    WHAT IF MY AFO DOES NOT FIT INTO MY SHOE?  Check to see if the heel is all the way back into the shoe  Look to see if the back of the shoe is crushed which could cause the AFO to hang up  Remove the insole of the shoe to accommodate the AFO  Make sure you are wear proper footwear at all times on both feet.    WHAT ARE SOME PRECAUTIONS I SHOULD TAKE WITH MY AFO?  Never wear your AFO without shoes, it will increase your risk of falls  Make sure you are following the initial wear schedule of the AFO to avoid skin irritation  Be aware of volume changes of your body that could affect the fit of your AFO  Do not sleep in your AFO  Different styles of shoes can affect how your standing and walking balance      Please contact our clinic if you develop any signs of redness, discomfort, or any other concerns regarding your brace    Please call: [phone]

## 2020-09-18 NOTE — Unmapped (Signed)
[facility] [place]  Ep Remote Monitoring   Tel: [phone]  Fax: [phone]    Cardiac Implantable Electronic Device Remote Monitoring-Loop Recorder      Visit Date: 09/18/2020    Reason for Monitoring: Palpitations    Device Findings: Huston Foley detected episodes; 3  Tachy detected episodes; 2   AT episodes longest episode 6 hours highest average rate 134 bpm. Brady event 26 seconds lowest rate 33 bpm occurred at 0506.     Manufacturer of Device:    [employer]    Type of Device:    Implantable Loop Monitor    See scanned/downloaded PDF report for model numbers, serial numbers, and date(s) of implant.    Presenting Rhythm:       normal sinus rhythm    Heart Rate Variability:   Good HRV    Patient Activity:    Patient Active n/a      Plan:    Continue Routine Remote Monitoring    Please see downloaded PDF file of transmission under Media Tab for full details of device interrogation to include, when applicable, battery status, and programmed parameters.

## 2020-09-22 NOTE — Unmapped (Signed)
Patient/Caregiver called into to the main line of the [facility] Program.   Care Coordinator transferred phone call to another team member .    Mikeal Hawthorne Health Worker - Family Navigator  She/Her/Hers  [facility]  p [phone]  [email or web address]

## 2020-09-22 NOTE — Unmapped (Signed)
Complex Case Management Pre-Assessment Note  Pre-Assessment  NOTE      Summary:  Care Coordinator spoke with patient and verified correct patient using two identifiers today for enrollment in Complex Case Management. Informed patient of Complex Case Management services. Pt has agreed to participate in the Complex Case Management program. Care Coordinator contact information was provided to patient. Care Coordinator scheduled a time for Case Manager to call patient to complete initial Assessment. Assessment Scheduled for : 10/03/2020 at 4 pm     General Case management Questions:     General Care Management - Patient Level    Assessment completed with: patient[LT1.1]  Patient lives with: brother, parent (Comment: mother and brother)[LT1.2]  Support system: parent, siblings (Comment: aunt little sister mom brother, friends)[LT1.2]  Type of residence: private residence[LT1.1]  DME used at home: BP cuff, walker (Comment: blood sugar monitor, walker. ortho braces for both legs)[LT1.1]  Transportation means: personal vehicle (Comment: mobility declining. not driving, family ZOXWRU)[EA5.4]  Does your health interfere with activities of daily living?: work, eating, social interactions (Comment: meal prep assistance)[LT1.1]  Exercise: yes[LT1.2]  Minutes per session: 60[LT1.1]  Times per week: 3[LT1.1]  Type of exercise: cardio rehab, gym [employer][LT1.1]  Follow special diet?:  (Comment: carb counting)[LT1.2]  Interested in seeing dietician?: No (Comment: not at this time)[LT1.2]  Experiencing side effects from current medications: No (Comment: not at this time)[LT1.2]  Interested in seeing pharmacist?: No (Comment: not at this time)[LT1.2]  Difficulty keeping appointments: No (Comment: not at this time)[LT1.2]  Need assistance with community resources?: No (Comment: not that she can think of)[LT1.2]  Other significant issues impacting care?: Can't move around as well but still working at computer all day  Takes depression meds  Pain in legs, uses walker, limits social interactions  diabetes[LT1.2]     Attribution     LT1.1 Rockie Neighbours 09/22/20 16:41    LT1.2 Rockie Neighbours 09/22/20 16:59           History Review:     Past Medical History:   Diagnosis Date   ??? Allergic    ??? Anemia    ??? Asthma    ??? Eczema    ??? Gastroparesis    ??? GERD (gastroesophageal reflux disease)    ??? Hyperlipidemia    ??? Hypertension    ??? Impaired mobility    ??? Murmur, cardiac    ??? Obesity    ??? 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 Type 2 diabetes mellitus (CMS-HCC)     BS- 90-130     Caregiver burden No   Cognitive Impairment No   Falls Risk No   Financial difficulty No   Frail Elderly No   Hearing impairment/loss No   Homeless No   Impaired mobility Yes   Inadequate social/family support No   Ineffective family coping No   Low Literacy No   Nonadherence to medication No   Non-english speaking No   Terminal Illness/Hospice No   Transportation barriers No   Visual impairment No     Past Surgical History:   Procedure Laterality Date   ??? ABLATION OF DYSRHYTHMIC FOCUS     ??? CARDIAC CATHETERIZATION     ??? CHOLECYSTECTOMY  2015   ??? CORONARY STENT PLACEMENT     ??? ELBOW FRACTURE SURGERY     ??? PR CATH PLACE/CORON ANGIO, IMG SUPER/INTERP,W LEFT HEART VENTRICULOGRAPHY N/A 11/12/2019    Procedure: Left Heart Catheterization;  Surgeon: Alvira Philips, MD;  Location: [facility] CATH;  Service: Cardiology   ??? PR COMPRE EP EVAL ABLTJ 3D MAPG TX SVT N/A 06/04/2019    Procedure: Accessory Pathway Ablation;  Surgeon: Joretta Bachelor, MD;  Location: [facility] EP;  Service: Cardiology   ??? PR ELECTROPHYS EV,R A-V PACE/REC,W/O INDUCT N/A 06/04/2019    Procedure: Comprehensive Study W IND;  Surgeon: Joretta Bachelor, MD;  Location: [facility] EP;  Service: Cardiology   ??? PR EXC SWEAT GLAND LESN AXILL,SIMPL Right 10/30/2015    Procedure: RIGHT AXILLAE EXCISE HIDRADENITIS;  Surgeon: Oren Section, MD;  Location: [facility]; Service: Plastics   ??? PR NEGATIVE PRESSURE WOUND THERAPY DME </= 50 SQ CM N/A 10/30/2015    Procedure: WOUND VAC PLACEMENT;  Surgeon: Oren Section, MD;  Location: [facility];  Service: Plastics   ??? PR SPLIT GRFT TRUNK,ARM,LEG <100 SQCM N/A 10/30/2015    Procedure: SPLIT-THICKNESS SKIN GRAFT;  Surgeon: Oren Section, MD;  Location: [facility];  Service: Plastics     Family History   Problem Relation Age of Onset   ??? Diabetes Mother    ??? Diabetes Father    ??? Heart disease Maternal Grandmother    ??? Diabetes Maternal Grandmother    ??? Heart disease Paternal Grandmother    ??? Melanoma Neg Hx    ??? Basal cell carcinoma Neg Hx    ??? Squamous cell carcinoma Neg Hx      Ready to quit: Not Answered  Counseling given: Not Answered  Comment: Black milds    Ready to quit: Not Answered  Counseling given: Not Answered  Comment: Black milds                     Social History     Substance and Sexual Activity   Drug Use Yes   ??? Frequency: 2.0 times per week   ??? Types: Marijuana     Social History     Substance and Sexual Activity   Sexual Activity Not on file       Self-Efficacy Score:  SCORE: 7.17 (09/22/2020  4:41 PM)      Medications:   Outpatient Encounter Medications as of 09/22/2020   Medication Sig Dispense Refill   ??? acetaminophen (TYLENOL 8 HOUR) 650 MG CR tablet Take 650 mg by mouth nightly.     ??? acyclovir (ZOVIRAX) 400 MG tablet TAKE 1 TABLET(400 MG) BY MOUTH THREE TIMES DAILY 90 tablet 0   ??? albuterol HFA 90 mcg/actuation inhaler Inhale 2 puffs every four (4) hours as needed.      ??? amLODIPine (NORVASC) 10 MG tablet Take 1 tablet (10 mg total) by mouth daily. 90 tablet 3   ??? amoxicillin-clavulanate (AUGMENTIN) 875-125 mg per tablet Take twice daily 60 tablet 2   ??? aspirin 81 MG chewable tablet Chew 1 tablet (81 mg total) daily. (Patient taking differently: Chew 81 mg nightly.) 30 tablet 11   ??? atorvastatin (LIPITOR) 80 MG tablet Take 1 tablet (80 mg total) by mouth daily. 90 tablet 1   ??? BELSOMRA 10 mg tablet TAKE 1 TABLET(10 MG) BY MOUTH EVERY NIGHT 90 tablet 1   ??? blood sugar diagnostic (GLUCOSE BLOOD) Strp Test once daily and as directed.one touch verio flex testing strips 100 strip 3   ??? blood-glucose meter kit Currently has One Touch Ultra test strips. Please issue per her formulary. 1 each 1   ??? cetirizine 10 mg cap Take 10 mg by mouth daily as needed.      ???  clindamycin (CLEOCIN T) 1 % lotion Once daily to body areas that are typically affected 60 mL 11   ??? clobetasoL (TEMOVATE) 0.05 % ointment Apply topically Two (2) times a day. As needed for flares up to a week at a time 60 g 3   ??? cyclobenzaprine (FLEXERIL) 10 MG tablet TAKE 1 TABLET(10 MG) BY MOUTH EVERY NIGHT 90 tablet 1   ??? empty container (SHARPS-A-GATOR DISPOSAL SYSTEM) Misc Use as directed for sharps disposal 1 each 2   ??? evolocumab (REPATHA SYRINGE) 140 mg/mL Syrg Inject the contents of one pen (140 mg) under the skin every fourteen (14) days. 6 mL 3   ??? ferrous sulfate 325 (65 FE) MG tablet Take by mouth. Once daily PRN     ??? flash glucose scanning reader (FLASH GLUCOSE SCANNING READER) by Other route Take as directed. Dispense Libre 2 scanning reader, use as directed 1 each 2   ??? flash glucose scanning reader (FLASH GLUCOSE SCANNING READER) by Other route Take as directed. Dispense Libre 2 scanning reader, use as directed 1 each 2   ??? flash glucose sensor (FLASH GLUCOSE SENSOR) kit by Other route every fourteen (14) days. Dispense Libre 2 sensors, either 1 mo supply or 3 mo supply, as per patient preference; change q 14 days 6 each 0   ??? furosemide (LASIX) 40 MG tablet TAKE 1 TABLET BY MOUTH DAILY AS NEEDED FOR> 2 POUND WEIGHT GAIN IN A 24 HOUR PERIOD (Patient taking differently: 40 mg nightly.) 90 tablet 1   ??? gabapentin (NEURONTIN) 300 MG capsule Take 300 mg (1 capsule) in the morning, 300 mg at noon, and 600 mg (2 capsules) in the evening. 360 capsule 1   ??? HYDROcodone-acetaminophen (NORCO) 5-325 mg per tablet Take 1 tablet every 6 hours as needed for pain 15 tablet 0   ??? hydrOXYzine (ATARAX) 25 MG tablet TAKE 1 TABLET(25 MG) BY MOUTH EVERY 8 HOURS AS NEEDED 90 tablet 1   ??? insulin ASPART (NOVOLOG FLEXPEN) 100 unit/mL (3 mL) injection pen Inject 0.06 mL (6 Units total) under the skin Three (3) times a day before meals. Take 5 units three times per day with meals (2 units with snacks/small meals), plus 2 extra for every 50 points over 150. Up to 50 units per day. 15 mL 12   ??? insulin degludec (TRESIBA FLEXTOUCH U-100) 100 unit/mL (3 mL) InPn Inject 50 units every night. Increase or decrease dose accordingly as instructed by MD. Maximum of 64 units every night. 21 mL 11   ??? insulin needles, disposable, (BD ULTRA-FINE NANO PEN NEEDLES) 32 x 5/32  Ndle Inject 1 each under the skin Five (5) times a day. Use 5x/day with Lantus, Novolog, and Victoza pens. 200 each 12   ??? insulin syringe-needle U-100 (BD INSULIN SYRINGE ULT-FINE II) 0.5 mL 31 gauge x 5/16 Syrg ok to sub any brand or size insulin syringe preferred by insurance/patient, use 5x/day, dx E11.65 200 each 5   ??? insulin syringe-needle U-100 0.3 mL 31 x 3/8 Syrg For use with Humulin R AC TID. 300 Syringe 3   ??? losartan (COZAAR) 25 MG tablet TAKE 1 TABLET(25 MG) BY MOUTH DAILY 90 tablet 3   ??? metFORMIN (GLUCOPHAGE-XR) 500 MG 24 hr tablet Take 2 tablets (1,000 mg total) by mouth two (2) times a day. With meals. 360 tablet 3   ??? metoclopramide (REGLAN) 10 MG tablet Take 1 tablet (10 mg total) by mouth four (4) times a day as needed. Take 1  tablet 4 times daily as neeed 120 tablet 3   ??? metoprolol succinate (TOPROL-XL) 100 MG 24 hr tablet Take 0.5 tablets (50 mg total) by mouth every morning. 90 tablet 1   ??? montelukast (SINGULAIR) 10 mg tablet Take 10 mg by mouth daily as needed.      ??? nitroglycerin (NITROSTAT) 0.4 MG SL tablet Place 1 tablet (0.4 mg total) under the tongue every five (5) minutes as needed for chest pain. Maximum of 3 doses in 15 minutes. 25 tablet 0   ??? NORLYDA 0.35 mg tablet TAKE 1 TABLET(0.35 MG) BY MOUTH DAILY 84 tablet 3   ??? omeprazole (PRILOSEC) 20 MG capsule Take 20 mg by mouth daily.     ??? ondansetron (ZOFRAN-ODT) 4 MG disintegrating tablet Take 1 tablet (4 mg total) by mouth every eight (8) hours as needed for nausea. 60 tablet 3   ??? prasugreL (EFFIENT) 10 mg tablet Take 1 tablet (10 mg total) by mouth daily. 30 tablet 11   ??? promethazine (PHENERGAN) 25 MG tablet TAKE 1 TABLET(25 MG) BY MOUTH EVERY 6 HOURS AS NEEDED FOR NAUSEA 60 tablet 3   ??? sertraline (ZOLOFT) 50 MG tablet Take 1.5 tablets (75 mg total) by mouth daily. 135 tablet 3   ??? simethicone 125 mg cap Take 250 mg by mouth nightly.     ??? spironolactone (ALDACTONE) 100 MG tablet Take 1 tablet (100 mg total) by mouth daily. 90 tablet 1   ??? triamcinolone (KENALOG) 0.1 % cream Apply topically once as needed.        No facility-administered encounter medications on file as of 09/22/2020.        Social History Review:     Programmer, applications: Low Risk    ??? Difficulty of Paying Living Expenses: Not hard at all      Food Insecurity: No Food Insecurity   ??? Worried About Programme researcher, broadcasting/film/video in the Last Year: Never true   ??? Ran Out of Food in the Last Year: Never true      Transportation Needs: No Transportation Needs   ??? Lack of Transportation (Medical): No   ??? Lack of Transportation (Non-Medical): No      Physical Activity: Sufficiently Active   ??? Days of Exercise per Week: 3 days   ??? Minutes of Exercise per Session: 60 min      Stress: Stress Concern Present   ??? Feeling of Stress : To some extent      Intimate Partner Violence: Not At Risk   ??? Fear of Current or Ex-Partner: No   ??? Emotionally Abused: No   ??? Physically Abused: No   ??? Sexually Abused: No      Alcohol Use: Not At Risk   ??? How often do you have a drink containing alcohol?: Monthly or less   ??? How many drinks containing alcohol do you have on a typical day when you are drinking?: 1 - 2   ??? How often do you have 5 or more drinks on one occasion?: Less than monthy      Tobacco Use: High Risk   ??? Smoking Tobacco Use: Current Some Day Smoker   ??? Smokeless Tobacco Use: Never Used      Depression: Not at risk   ??? PHQ-2 Score: 0        Upcoming Appointment (s):   Future Appointments   Date Time Provider Department Center   09/26/2020  3:00 PM Ryan T Sanders [facility]  09/28/2020  3:15 PM Ancil Linsey, NP [facility]   10/04/2020  4:15 PM [facility] RM 1 [facility]   10/06/2020  3:00 PM Duran Pardue, CPO [facility]   10/11/2020  3:20 PM Loistine Chance, MD [facility]   10/19/2020 12:00 AM [place] [facility]   10/31/2020  3:00 PM Arelia Longest, MD [facility]   11/07/2020  4:20 PM Ardeth Sportsman, MD C[facility]   11/17/2020  9:00 AM Volney Presser, MD [facility]   11/20/2020 12:00 AM [facility] REMOTE MONITORING [facility]     COVID Vaccine Status    Have you received your COVID Vaccine YES/NO: Yes.   If you would ike to receive your COVID-19 vaccine or would like more information please contact your PCP or visit [facility] at [email or web address]     This patient is currently under review for Complex Case Management services. For progress, care plan changes, updates or recent discharges please contact CM.  Marland Kitchen           Rockie Neighbours

## 2020-09-26 ENCOUNTER — Institutional Professional Consult (permissible substitution): Admit: 2020-09-26 | Discharge: 2020-09-26 | Payer: PRIVATE HEALTH INSURANCE

## 2020-09-26 DIAGNOSIS — Z794 Long term (current) use of insulin: Principal | ICD-10-CM

## 2020-09-26 DIAGNOSIS — E1165 Type 2 diabetes mellitus with hyperglycemia: Principal | ICD-10-CM

## 2020-09-26 NOTE — Unmapped (Signed)
Diabetes Education FU Note    Referring Provider:  Arelia Longest MD    Time In / Out:   3pm-4pm; 60 min    mAARCIA  Assessment:   Presents with mother for FU education. Not doing insulin correctly. Avg glucose is in the 300's.        Plan:        Education Intervention:  -- carb counting review performed. Understanding was poor  -- insulin and the application of correction was reviewed. Pt thought that if she applied the ICR then the ISF was not used.  -- fixed doses v carb ratios discussed  -- role playing for dosing discussed with ICR of 9  -- libre meter evaluated  No lows  2-3 scans per day on avg  Data is in the 60% range  Avg 350  -- scan more often. Should be scanning and fixing sugar with corrections as long as it has been 3.5 hours from last dose.  -- get that [place] dosed correctly. Fasting under 140  -- return 4-5 weeks after next MD visit      Educator Recommendations / Plan for Supervising Physician:  -- see education provided above      Subjective:        Desired Leaning Objective for Today?  -- FU    Upon questioning and discussion patient reports;    -- Diabetes Meds  Tresiba 54 units;   ICR 9; base 6 + 2:50>150  Metformin 1500mg  per day; goal is 2000mg   No Ozempic    -- Monitoring  Libre 2     -- Nutrition  Has calorieking app  Lost papers        Objective:             Past Medical History:   Diagnosis Date   ??? Allergic    ??? Anemia    ??? Asthma    ??? Eczema    ??? Gastroparesis    ??? GERD (gastroesophageal reflux disease)    ??? Hyperlipidemia    ??? Hypertension    ??? Impaired mobility    ??? Murmur, cardiac    ??? Obesity    ??? 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 Type 2 diabetes mellitus (CMS-HCC)     BS- 90-130       Relevant Labs  HGB A1C, POC   Date/Time Value Ref Range Status   07/21/2020 08:52 AM 11.4 (H) <7.0 % Final     Comment:     A1c Glycemic Goal: <7.0%     **Goals should be individualized; more or less stringent A1c glycemic goals may be appropriate for individual patients.      (Adopted from: 2020 ADA Standards of Medical Care In Diabetes)  Point of Care A1c testing is not FDA-approved for the diagnosis of Diabetes.   02/16/2020 01:13 PM 11.6 (H) <7.0 % Final     Comment:     A1c Glycemic Goal: <7.0%     **Goals should be individualized; more or less stringent A1c glycemic goals may be appropriate for individual patients.      (Adopted from: 2020 ADA Standards of Medical Care In Diabetes)  Point of Care A1c testing is not FDA-approved for the diagnosis of Diabetes.   03/25/2014 12:43 PM 11.4 (H) 4.8 - 6.0 % Final     Comment:     Performed by:  [facility] Diabetes [facility] Endocrinology  [address]  [place], [place]  [address]     12/17/2013 12:31 PM 10.7 (H) 4.8 - 6.0 % Final     Comment:     Performed by:  [facility]  [address]  [place], [place]  [address]       Hemoglobin A1C   Date/Time Value Ref Range Status   05/15/2020 03:42 PM 12.2 (H) 4.8 - 5.6 % Final           Cathrine Muster. RD. CDCES

## 2020-09-26 NOTE — Unmapped (Signed)
Cardiac Implanted Electronic Device Remote Monitoring Alert    Alert date: 09/26/2020    Remote Network: [employer]    Reason for alert: Tachy event      ACTION: Provider notified. NP. Yevette Edwards and NP Excell Seltzer   Called Pt she stated that she was aware of the fast heart rate she was up overnight with her friend watching movies.     See PDF attached in media to this encounter when available for full episode details

## 2020-09-28 ENCOUNTER — Ambulatory Visit: Admit: 2020-09-28 | Discharge: 2020-09-29 | Payer: PRIVATE HEALTH INSURANCE

## 2020-09-28 MED ORDER — METOPROLOL SUCCINATE ER 100 MG TABLET,EXTENDED RELEASE 24 HR
ORAL_TABLET | 3 refills | 0 days | Status: CP
Start: 2020-09-28 — End: ?

## 2020-09-28 NOTE — Unmapped (Signed)
Orthopaedic Foot and Ankle Division  Encounter Provider: Ancil Linsey, NP  Date of Service: 09/28/2020 Last encounter Orthopaedics: 07/28/2020   Last encounter this provider: 07/28/2020            Primary Care Provider: Sherol Dade, MD  Referring Provider: Referred Self    ICD-10-CM   1. Foot drop, bilateral  M21.371    M21.372   2. Type 2 diabetes mellitus with hyperglycemia, with long-term current use of insulin (CMS-HCC)  E11.65    Z79.4   3. Polyneuropathy associated with underlying disease (CMS-HCC)  G63    Orthopaedic notes: No specialty comments available.    Physical Function CAT Score: 36 (09/28/20)  Pain Interference CAT Score: 57.1 (09/28/20)  Depression CAT Score: (not recorded)  Sleep CAT Score: (not recorded)  [email or web address]     Pamela Gutierrez is a 30 y.o. female   ASSESSMENT   Uncontrolled diabetes with peripheral neuropathy, bilateral foot drop        PLAN:     Patient has AFOs but is still waiting on her diabetic shoes.  She will give AFOs a try we will plan for follow-up in about 2 months with no imaging prior to exam    Requested Prescriptions      No prescriptions requested or ordered in this encounter      No orders of the defined types were placed in this encounter.      History:  (Data reviewed and verified by encounter provider.)  Chief Complaint   Patient presents with   ??? Other     Foot drop and instability     HPI:  30 y.o. female who reports that she is doing well.  She states she tried on her AFOs at the time of pickup and as she walked much better with them.  She currently does not have shoes that we will fit with them but is waiting on her shoes and will be picking them up shortly.  Pain Assessment: 0-10  0-10 Pain Scale: 3    Medical History Past Medical History:   Diagnosis Date   ??? Allergic    ??? Anemia    ??? Asthma    ??? Eczema    ??? Gastroparesis    ??? GERD (gastroesophageal reflux disease)    ??? Hyperlipidemia    ??? Hypertension    ??? Impaired mobility    ??? Murmur, cardiac    ??? Obesity    ??? 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 Type 2 diabetes mellitus (CMS-HCC)     BS- 90-130      Surgical History Past Surgical History:   Procedure Laterality Date   ??? ABLATION OF DYSRHYTHMIC FOCUS     ??? CARDIAC CATHETERIZATION     ??? CHOLECYSTECTOMY  2015   ??? CORONARY STENT PLACEMENT     ??? ELBOW FRACTURE SURGERY     ??? PR CATH PLACE/CORON ANGIO, IMG SUPER/INTERP,W LEFT HEART VENTRICULOGRAPHY N/A 11/12/2019    Procedure: Left Heart Catheterization;  Surgeon: Alvira Philips, MD;  Location[facility] CATH;  Service: Cardiology   ??? PR COMPRE EP EVAL ABLTJ 3D MAPG TX SVT N/A 06/04/2019    Procedure: Accessory Pathway Ablation;  Surgeon: Joretta Bachelor, MD;  Location: [facility] EP;  Service: Cardiology   ??? PR ELECTROPHYS EV,R A-V PACE/REC,W/O INDUCT N/A 06/04/2019    Procedure: Comprehensive Study W IND;  Surgeon: Joretta Bachelor, MD;  Location: [facility] EP;  Service:  Cardiology   ??? PR EXC SWEAT GLAND LESN AXILL,SIMPL Right 10/30/2015    Procedure: RIGHT AXILLAE EXCISE HIDRADENITIS;  Surgeon: Oren Section, MD;  Location: [facility];  Service: Plastics   ??? PR NEGATIVE PRESSURE WOUND THERAPY DME </= 50 SQ CM N/A 10/30/2015    Procedure: WOUND VAC PLACEMENT;  Surgeon: Oren Section, MD;  Location: [facility];  Service: Plastics   ??? PR SPLIT GRFT TRUNK,ARM,LEG <100 SQCM N/A 10/30/2015    Procedure: SPLIT-THICKNESS SKIN GRAFT;  Surgeon: Oren Section, MD;  Location: [facility];  Service: Plastics      Allergies Lanolin, Morphine (pf), Tomato, Venom-honey bee, Wool, Ibuprofen, Nsaids (non-steroidal anti-inflammatory drug), Iodinated contrast media, and Ioxaglate sodium   Medications She has a current medication list which includes the following prescription(s): acetaminophen, acyclovir, albuterol, amlodipine, amoxicillin-clavulanate, aspirin, atorvastatin, belsomra, glucose blood, blood-glucose meter, cetirizine, clindamycin, clobetasol, cyclobenzaprine, empty container, repatha syringe, ferrous sulfate, flash glucose scanning reader, flash glucose scanning reader, flash glucose sensor, furosemide, gabapentin, hydrocodone-acetaminophen, hydroxyzine, insulin aspart, tresiba flextouch u-100, pen needle, diabetic, insulin syringe-needle u-100, insulin syringe-needle u-100, losartan, metformin, metoclopramide, metoprolol succinate, montelukast, nitroglycerin, norlyda, omeprazole, ondansetron, prasugrel, promethazine, sertraline, simethicone, spironolactone, and triamcinolone.   Family History Her family history includes Diabetes in her father, maternal grandmother, and mother; Heart disease in her maternal grandmother and paternal grandmother.   Social History She reports that she has been smoking cigars. She has never used smokeless tobacco. She reports current alcohol use of about 1.0 standard drink of alcohol per week. She reports current drug use. Frequency: 2.00 times per week. Drug: Marijuana.Home address:[address]  [place] [address]  Occupation:         Occupational History   ??? Not on file     Social History     Social History Narrative   ??? Not on file            Exam:  The primary encounter diagnosis was Foot drop, bilateral. Diagnoses of Type 2 diabetes mellitus with hyperglycemia, with long-term current use of insulin (CMS-HCC) and Polyneuropathy associated with underlying disease (CMS-HCC) were also pertinent to this visit.   Estimated body mass index is 28.25 kg/m?? as calculated from the following:    Height as of 09/05/20: 166.4 cm (5' 5.5).    Weight as of 09/05/20: 78.2 kg (172 lb 6.4 oz).     Musculoskeletal   ??? Passive dorsiflexion ankle with the knee extended to 10 degrees bilaterally.  Nontender over the foot and ankle       Skin Benign, no lesions        Test Results  The primary encounter diagnosis was Foot drop, bilateral. Diagnoses of Type 2 diabetes mellitus with hyperglycemia, with long-term current use of insulin (CMS-HCC) and Polyneuropathy associated with underlying disease (CMS-HCC) were also pertinent to this visit.  Lab Results   Component Value Date    A1C 11.4 (H) 07/21/2020       No results found for: VITD    Imaging  No orders of the defined types were placed in this encounter.          DME ORDER:  Dx:  ,

## 2020-09-28 NOTE — Unmapped (Signed)
Patient is requesting the following refill  Requested Prescriptions     Pending Prescriptions Disp Refills   ??? metoprolol succinate (TOPROL-XL) 100 MG 24 hr tablet [Pharmacy Med Name: METOPROLOL ER SUCCINATE 100MG  TABS] 90 tablet 1     Sig: TAKE 1 TABLET(100 MG) BY MOUTH EVERY MORNING       Order pended. Please advise. Thanks    Last OV: 07/31/2020   Next OV: 10/11/2020    Sent 08/25/2020 with 1 refill

## 2020-10-03 NOTE — Unmapped (Signed)
Complex Case Management  SUMMARY NOTE    Complex Case Management  SUMMARY NOTE    Attempted to contact pt today at Cell number to complete initial assessment. Left message to return call.; 1st attempt    Discuss at next visit: Complete Initial Assessment

## 2020-10-04 ENCOUNTER — Ambulatory Visit: Admit: 2020-10-04 | Discharge: 2020-10-05 | Payer: PRIVATE HEALTH INSURANCE

## 2020-10-04 MED ADMIN — gadobenate dimeglumine (MULTIHANCE) 529 mg/mL (0.1mmol/0.2mL) solution 7 mL: 7 mL | INTRAVENOUS | @ 21:00:00 | Stop: 2020-10-04

## 2020-10-06 NOTE — Unmapped (Signed)
Complex Case Management  SUMMARY NOTE    Attempted to contact pt today at Cell number to complete initial assessment. Left message to return call.; 2nd attempt    Discuss at next visit: Complete Initial Assessment

## 2020-10-06 NOTE — Unmapped (Signed)
Called and left a VM for patient to return call.

## 2020-10-09 NOTE — Unmapped (Signed)
[facility] has made a third and final attempt to reach this patient to refill the following medication: Repatha.      We have left voicemails on the following phone numbers: [phone].    Dates contacted: 9/14, 9/19, 10/02/20  Last scheduled delivery: 07/14/20 (84 day supply)    The patient may be at risk of non-compliance with this medication. The patient should call the [facility] at [phone]  Option 4, then Option 2 (all other specialty patients) to refill medication.    Camillo Flaming   [facility] Specialty Pharmacist

## 2020-10-09 NOTE — Unmapped (Signed)
Attempted to call pt as well as mother.  LM on both vms to stop Atorvastatin for two weeks and to call me to follow up appointment with Dr. Julio Alm next month.

## 2020-10-10 NOTE — Unmapped (Signed)
Pt called and states she has talked to Dr. Julio Alm and will stop Atorvastatin for one month.  She has a follow up with him on Nov. 1.

## 2020-10-11 ENCOUNTER — Ambulatory Visit: Admit: 2020-10-11 | Discharge: 2020-10-12 | Payer: PRIVATE HEALTH INSURANCE

## 2020-10-11 DIAGNOSIS — E1165 Type 2 diabetes mellitus with hyperglycemia: Principal | ICD-10-CM

## 2020-10-11 DIAGNOSIS — F331 Major depressive disorder, recurrent, moderate: Principal | ICD-10-CM

## 2020-10-11 DIAGNOSIS — E1143 Type 2 diabetes mellitus with diabetic autonomic (poly)neuropathy: Principal | ICD-10-CM

## 2020-10-11 DIAGNOSIS — M21371 Foot drop, right foot: Principal | ICD-10-CM

## 2020-10-11 DIAGNOSIS — N921 Excessive and frequent menstruation with irregular cycle: Principal | ICD-10-CM

## 2020-10-11 DIAGNOSIS — I10 Essential (primary) hypertension: Principal | ICD-10-CM

## 2020-10-11 DIAGNOSIS — K3184 Gastroparesis: Principal | ICD-10-CM

## 2020-10-11 DIAGNOSIS — M21372 Foot drop, left foot: Principal | ICD-10-CM

## 2020-10-11 DIAGNOSIS — Z794 Long term (current) use of insulin: Principal | ICD-10-CM

## 2020-10-11 NOTE — Unmapped (Signed)
Thanks for choosing [facility] at [employer]  for your medical care!    If you have any questions about your visit today, please call us at [phone].      For medication refills, please have your pharmacist send an electronic refill request    If you need care after 5:00 pm during the week or on the weekend:  Call [facility] at [phone] for nurse/physician advice or...    Go to [facility] walk-in clinic [address], [address], [place] [phone] -- Open 7 days a week from 9:00AM - 8:00PM        We will always try to notify you of the results from laboratory tests within ten days of the study.  If you do not hear from [place] by phone, letter, or electronic message, please call the office immediately for further information.       Our fax number is [phone]

## 2020-10-11 NOTE — Unmapped (Signed)
INTERNAL MEDICINE OUTPATIENT NOTE    ASSESSMENT   Diagnosis ICD-10-CM Associated Orders   1. Type 2 diabetes mellitus with hyperglycemia, with long-term current use of insulin (CMS-HCC)  E11.65     Z79.4    2. Gastroparesis due to DM (CMS-HCC)  E11.43     K31.84    3. Primary hypertension  I10    4. Left foot drop  M21.372    5. Moderate episode of recurrent major depressive disorder (CMS-HCC)  F33.1    6. Menometrorrhagia  N92.1          PLAN  1. Patient reports improved control since her appointment with diabetes educators and learning to properly dose insulin. Fasting BG's now around 200-220 down from 300s. She continues to titrate insulin under guidance of endocrinology. No labs needed today.  2. Patient reports improvement in gastroparesis symptoms since stopping ozempic. Was seen by GI and is managing intermittent nausea with Reglan, Zofran, and Phenergan as needed. GI recomends repeating gastric emptying study, she will schedule with them.   3. BP at goal today, continue current medications.   4. Patient fitted for AFOs with orthopedics, which have greatly improved mobility and gait. She has follow ups scheduled for adjustments as needed.  5. Reports stable moods on sertraline and with regular therapy.  6. Patient reports usual menorrhagia now with bleeding in between cycles several months during this year.  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  Not keen on the idea of a Mirena IUD.  May benefit from uterine ablation.  She plans to schedule follow up with her gynecologist.       Preventive services addressed today  Influenza: ordered    -- Patient verbalized an understanding of today's assessment and recommendations, as well as the purpose of ongoing medications.    Medication adherence and barriers to the treatment plan have been addressed. Opportunities to optimize healthy behaviors have been discussed. Patient / caregiver voiced understanding.    Return in about 2 months (around 12/11/2020).      Reason for Visit:   Chief Complaint   Patient presents with   ??? Follow-up     Discuss MRI results         History of Present Illness:    This is a 30 y.o. year old female who presents for follow up of diabetes, gastroparesis, hypertension, left foot drop, depression, and irregular menstrual bleeding.     Diabetes:  Patient with a history of T2DM requiring long term use of insulin. Her diabetes has been hard to control and she is followed and managed by the endocrine team. Since her last visit here, she was seen by diabetes educators due to incorrect dosing of her insulin. She had poor understanding previously and had average BGs in 300s. Since meeting with diabetes educator she has improved understanding of insulin dosing and carb counting, and has had fasting glucose averages down to 200-220. She continues to work with them to titrate insulin to goal fasting BG of under 140.     Gastroparesis:  Secondary to diabetes. Was seen by GI, and is currently using a combination of Reglan, Zofran, and Phenergan to control nausea. Patient feels her symptoms are much improved since being off Ozempic. GI would like to repeat gastric emptying study, as her last one was around 2013-14.    Foot drop:  Patient was fitted for AFOs by orthopedics, which have been very helpful. She reports increased stability and improvement in her  gait. She is hopeful that with continued use of orthotics, she will be able to transition from her rollator to a cane.     Depression:  Patient feels her moods are stable. She continues with sertraline and regular therapy. She says that she notices when she misses a dose, and feels her moods are more labile when that happens, however she very rarely forgets.     Irregular menstrual bleeding:  Patient reports ending her cycle around two weeks ago, and has had a return of menstrual bleeding this week. She only had 1-1.5 weeks in between ending her period and restarting the bleeding, which is unusual for her. She has had one prior occurrence of this about 6 months ago, and also had a two week long cycle 5 months ago. She reports a heavy first day of most periods, needing to change a tampon every hour, and less, but still heavy bleeding during the next several days of her periods, needing to change her tampon every 3 hours. The bleeding between cycles is also heavy, and she reports passage of large clots. She does not have pelvic or abdominal pain. She has a gynecologist at [facility].    REVIEW OF SYSTEMS: A comprehensive review of systems was conducted and is negative except for the above.           Medications:  Current Outpatient Medications   Medication Sig Dispense Refill   ??? acetaminophen (TYLENOL 8 HOUR) 650 MG CR tablet Take 650 mg by mouth nightly.     ??? acyclovir (ZOVIRAX) 400 MG tablet TAKE 1 TABLET(400 MG) BY MOUTH THREE TIMES DAILY 90 tablet 0   ??? albuterol HFA 90 mcg/actuation inhaler Inhale 2 puffs every four (4) hours as needed.      ??? amLODIPine (NORVASC) 10 MG tablet Take 1 tablet (10 mg total) by mouth daily. 90 tablet 3   ??? aspirin 81 MG chewable tablet Chew 1 tablet (81 mg total) daily. (Patient taking differently: Chew 81 mg nightly.) 30 tablet 11   ??? BELSOMRA 10 mg tablet TAKE 1 TABLET(10 MG) BY MOUTH EVERY NIGHT 90 tablet 1   ??? blood sugar diagnostic (GLUCOSE BLOOD) Strp Test once daily and as directed.one touch verio flex testing strips 100 strip 3   ??? cetirizine 10 mg cap Take 10 mg by mouth daily as needed.      ??? clindamycin (CLEOCIN T) 1 % lotion Once daily to body areas that are typically affected 60 mL 11   ??? clobetasoL (TEMOVATE) 0.05 % ointment Apply topically Two (2) times a day. As needed for flares up to a week at a time 60 g 3   ??? cyclobenzaprine (FLEXERIL) 10 MG tablet TAKE 1 TABLET(10 MG) BY MOUTH EVERY NIGHT 90 tablet 1   ??? empty container (SHARPS-A-GATOR DISPOSAL SYSTEM) Misc Use as directed for sharps disposal 1 each 2   ??? evolocumab (REPATHA SYRINGE) 140 mg/mL Syrg Inject the contents of one pen (140 mg) under the skin every fourteen (14) days. 6 mL 3   ??? ferrous sulfate 325 (65 FE) MG tablet Take by mouth. Once daily PRN     ??? flash glucose scanning reader (FLASH GLUCOSE SCANNING READER) by Other route Take as directed. Dispense Libre 2 scanning reader, use as directed 1 each 2   ??? flash glucose scanning reader (FLASH GLUCOSE SCANNING READER) by Other route Take as directed. Dispense Libre 2 scanning reader, use as directed 1 each 2   ??? flash glucose sensor (FLASH  GLUCOSE SENSOR) kit by Other route every fourteen (14) days. Dispense Libre 2 sensors, either 1 mo supply or 3 mo supply, as per patient preference; change q 14 days 6 each 0   ??? furosemide (LASIX) 40 MG tablet TAKE 1 TABLET BY MOUTH DAILY AS NEEDED FOR> 2 POUND WEIGHT GAIN IN A 24 HOUR PERIOD (Patient taking differently: 40 mg nightly.) 90 tablet 1   ??? gabapentin (NEURONTIN) 300 MG capsule Take 300 mg (1 capsule) in the morning, 300 mg at noon, and 600 mg (2 capsules) in the evening. 360 capsule 1   ??? hydrOXYzine (ATARAX) 25 MG tablet TAKE 1 TABLET(25 MG) BY MOUTH EVERY 8 HOURS AS NEEDED 90 tablet 1   ??? insulin ASPART (NOVOLOG FLEXPEN) 100 unit/mL (3 mL) injection pen Inject 0.06 mL (6 Units total) under the skin Three (3) times a day before meals. Take 5 units three times per day with meals (2 units with snacks/small meals), plus 2 extra for every 50 points over 150. Up to 50 units per day. 15 mL 12   ??? insulin degludec (TRESIBA FLEXTOUCH U-100) 100 unit/mL (3 mL) InPn Inject 50 units every night. Increase or decrease dose accordingly as instructed by MD. Maximum of 64 units every night. 21 mL 11   ??? insulin needles, disposable, (BD ULTRA-FINE NANO PEN NEEDLES) 32 x 5/32  Ndle Inject 1 each under the skin Five (5) times a day. Use 5x/day with Lantus, Novolog, and Victoza pens. 200 each 12   ??? insulin syringe-needle U-100 (BD INSULIN SYRINGE ULT-FINE II) 0.5 mL 31 gauge x 5/16 Syrg ok to sub any brand or size insulin syringe preferred by insurance/patient, use 5x/day, dx E11.65 200 each 5   ??? insulin syringe-needle U-100 0.3 mL 31 x 3/8 Syrg For use with Humulin R AC TID. 300 Syringe 3   ??? losartan (COZAAR) 25 MG tablet TAKE 1 TABLET(25 MG) BY MOUTH DAILY 90 tablet 3   ??? metFORMIN (GLUCOPHAGE-XR) 500 MG 24 hr tablet Take 2 tablets (1,000 mg total) by mouth two (2) times a day. With meals. 360 tablet 3   ??? metoclopramide (REGLAN) 10 MG tablet Take 1 tablet (10 mg total) by mouth four (4) times a day as needed. Take 1 tablet 4 times daily as neeed 120 tablet 3   ??? metoprolol succinate (TOPROL-XL) 100 MG 24 hr tablet TAKE 1 TABLET(100 MG) BY MOUTH EVERY MORNING 90 tablet 3   ??? montelukast (SINGULAIR) 10 mg tablet Take 10 mg by mouth daily as needed.      ??? nitroglycerin (NITROSTAT) 0.4 MG SL tablet Place 1 tablet (0.4 mg total) under the tongue every five (5) minutes as needed for chest pain. Maximum of 3 doses in 15 minutes. 25 tablet 0   ??? NORLYDA 0.35 mg tablet TAKE 1 TABLET(0.35 MG) BY MOUTH DAILY 84 tablet 3   ??? omeprazole (PRILOSEC) 20 MG capsule Take 20 mg by mouth daily.     ??? ondansetron (ZOFRAN-ODT) 4 MG disintegrating tablet Take 1 tablet (4 mg total) by mouth every eight (8) hours as needed for nausea. 60 tablet 3   ??? prasugreL (EFFIENT) 10 mg tablet Take 1 tablet (10 mg total) by mouth daily. 30 tablet 11   ??? promethazine (PHENERGAN) 25 MG tablet TAKE 1 TABLET(25 MG) BY MOUTH EVERY 6 HOURS AS NEEDED FOR NAUSEA 60 tablet 3   ??? sertraline (ZOLOFT) 50 MG tablet Take 1.5 tablets (75 mg total) by mouth daily. 135 tablet 3   ???  simethicone 125 mg cap Take 250 mg by mouth nightly.     ??? triamcinolone (KENALOG) 0.1 % cream Apply topically once as needed.      ??? atorvastatin (LIPITOR) 80 MG tablet Take 1 tablet (80 mg total) by mouth daily. (Patient not taking: Reported on 10/11/2020) 90 tablet 1   ??? blood-glucose meter kit Currently has One Touch Ultra test strips. Please issue per her formulary. 1 each 1   ??? spironolactone (ALDACTONE) 100 MG tablet Take 1 tablet (100 mg total) by mouth daily. 90 tablet 1     No current facility-administered medications for this visit.            PHYSICAL EXAM:  BP (P) 128/80 (BP Site: R Arm, BP Position: Sitting, BP Cuff Size: Medium)  - Pulse (P) 80  - Ht (P) 166.4 cm (5' 5.5)  - Wt (P) 82.1 kg (181 lb)  - SpO2 (P) 98%  - BMI (P) 29.66 kg/m??     GENERAL:  This is a pleasant female in no distress.  The patient maintains good eye contact, good judgment, fluent speech, normal affect.  LUNGS:  Relaxed respiratory effort.  Clear to auscultation bilaterally.   CARDIOVASCULAR:  Regular rate and rhythm, no murmurs.   ABDOMEN:  Normal bowel sounds, soft, nontender, nondistended, no masses or organomegaly.   MSK: No focal muscle tenderness.  SKIN: Appropriately warm and moist.  No concerning rashes or lesions.   NEURO: Stable gait and coordination   EXTREMITIES:  No edema.      BP Readings from Last 3 Encounters:   10/11/20 (P) 128/80   09/05/20 141/102   09/01/20 157/86      Wt Readings from Last 3 Encounters:   10/11/20 (P) 82.1 kg (181 lb)   09/05/20 78.2 kg (172 lb 6.4 oz)   09/01/20 77 kg (169 lb 12.8 oz)        I attest that I, Trina Ao, evaluated this patient and created this note for Sherol Dade, MD .    Trina Ao  MS3, [facility]  October 11, 2020    I have verified all student documentation or findings.  I have personally performed or re-performed the physical exam and medical decision making. Sherol Dade, MD          Note - This record has been created using Dragon software. Chart creation errors have been sought, but may not always have been located. Such creation errors do not reflect on the standard of medical care.

## 2020-10-12 NOTE — Unmapped (Signed)
Complex Case Management  SUMMARY NOTE    CM sent letter. Will discharge due to not able to reach.

## 2020-10-19 ENCOUNTER — Institutional Professional Consult (permissible substitution): Admit: 2020-10-19 | Discharge: 2020-10-20 | Payer: PRIVATE HEALTH INSURANCE

## 2020-10-19 DIAGNOSIS — Z4509 Encounter for adjustment and management of other cardiac device: Principal | ICD-10-CM

## 2020-10-19 NOTE — Unmapped (Signed)
[facility]  Ep Remote Monitoring   Tel: [phone]  Fax: [phone]    Cardiac Implantable Electronic Device Remote Monitoring-Loop Recorder      Visit Date: 10/19/2020    Reason for Monitoring: Palpitations    Device Findings: Tachy detected episodes; 1 and 1 AT episode occurred at the same time average rate 128 bpm.      Manufacturer of Device:    [employer]    Type of Device:    Implantable Loop Monitor    See scanned/downloaded PDF report for model numbers, serial numbers, and date(s) of implant.    Presenting Rhythm:       normal sinus rhythm    Heart Rate Variability:   Good HRV    Patient Activity:    Patient Active n/a      Plan:    Continue Routine Remote Monitoring    Please see downloaded PDF file of transmission under Media Tab for full details of device interrogation to include, when applicable, battery status, and programmed parameters.

## 2020-10-20 ENCOUNTER — Ambulatory Visit
Admit: 2020-10-20 | Discharge: 2020-10-21 | Payer: PRIVATE HEALTH INSURANCE | Attending: Rehabilitative and Restorative Service Providers" | Primary: Rehabilitative and Restorative Service Providers"

## 2020-10-20 NOTE — Unmapped (Signed)
AFO Follow Up    Purpose of Visit     Pamela Gutierrez came to the clinic for follow up regarding her solid AFOs. She came to the clinic with a companion/caregiver, and used a [employer]     The right AFO instep strap ripped off. Also she has some rubbing bilaterally at the malleoli/just above.      Actions taken today     There was observed tightness at her ankles bilaterally medially.    The right ankle strap chafe was reattached.    Bilaterally, the medial malleoli trimlines were flared outward and padded to reduce rubbing      Goals     Control foot drop  Control tibial progression  Reduce steppage gait  Provide ML stability to the ankle  Wear brace daily  Remain free from falls      Assessment   Assessment: Pamela Gutierrez was seen for follow up and adjustment to her AFOs. Overall, she only needed minimal adjustments. She marked feeling more stable and more independent with her AFOs. She has a good support team that encourages her to use to her AFOs.       Device checked for safety and security during the visit today.      Instructions provided:   []  Device purpose   []  How to put on, take off and wear the device  []  Cleaning / Maintenance  [x]  Risks / Benefits of wearing   [x]  Instructions to contact should issues arise      Plan    Return Visit:  PRN     Authorization expectations:  none     [employer]P Fabrication: none

## 2020-10-23 DIAGNOSIS — L732 Hidradenitis suppurativa: Principal | ICD-10-CM

## 2020-10-23 MED ORDER — SPIRONOLACTONE 100 MG TABLET
ORAL_TABLET | 1 refills | 0.00000 days | Status: CP
Start: 2020-10-23 — End: ?

## 2020-10-24 MED ORDER — SPIRONOLACTONE 100 MG TABLET
ORAL_TABLET | 1 refills | 0 days
Start: 2020-10-24 — End: ?

## 2020-10-24 NOTE — Unmapped (Signed)
Patient is requesting the following refill  Requested Prescriptions     Pending Prescriptions Disp Refills   ??? spironolactone (ALDACTONE) 100 MG tablet [Pharmacy Med Name: SPIRONOLACTONE 100MG  TABLETS] 90 tablet 1     Sig: TAKE 1 TABLET(100 MG) BY MOUTH DAILY       Order refilled per protocol.    Last OV: 10/11/2020   Next OV: 12/11/2020

## 2020-10-26 MED ORDER — FREESTYLE LIBRE 2 SENSOR KIT
ORAL | 3 refills | 0.00000 days | Status: CP
Start: 2020-10-26 — End: ?

## 2020-10-26 NOTE — Unmapped (Signed)
[facility] prescription for [place] 2 sensors forwarded to provider for signature.

## 2020-10-30 NOTE — Unmapped (Addendum)
 Endocrinology Clinic Follow Up Visit    ASSESSMENT AND PLAN:     1. 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 CGM downloaded and interpreted x14 days. Daily trends reviewed including patterns for persistently high BGs >300-400s with TAR >90%. Goal HbA1c 7.0%. GAD and islet cell ab negative. C-peptide 1.56 with BG 219 (02/2020).     - Increase Tresiba to 60 units at bedtime  - Continue Novolog CR 1:9, ISF 2:50>150 with meals  - Counseled at length on ways to improve adherence as this is her primary barrier; she mainly has issues giving her Novolog dose at lunch due to time constraints and she does not feel comfortable giving under clothing at work--we discussed that for lunch as last resort she can give her insulin through clothing as alternative to not giving insulin at all  - Continue Metformin 1.5gm daily, she is unable to tolerate higher due to side effects  - Previously on GLP-1, stopped due to gastroparesis concerns however may resume eventually if/when BGs improved; may also consider SGLT-2, has history of UTIs recently and may have increased risk if started now in setting of persistently high BGs > 300-400s  - She has no plans for pregnancy at this time and is on OCPs; have previously discussed it would be best for her to optimize her BG control prior to pregnancy  - Freestyle Libre 2 refills sent to pharmacy; she has had issues getting supplies--will have Joni Reining, RN reach out to her in several days to ensure she has received  - RTC in 1 week; if her BGs continue to be high she may need supervised insulin injection in clinic after lunch    Diabetes-related complications:  - Retinopathy: (+) last eye exam 05/2020 with DR  - Neuropathy: (+) foot exam completed in 11/2019, discussed proper foot care  - Nephropathy: Last urine MA/C normal in 12/2019 and GFR normal in 08/2020  > on Losartan 25mg   - Lipids/ASCVD risk: (+) CAD/STEMI in 11/2019, last lipids with LDL 138 in 06/2020  > on Repatha 140mg  every 2 weeks, being managed by PCP and Cardiology and is holding Atorvastatin currently  - Immunizations: S/p COVID vaccine    MDM Level 5: Patient has one or more chronic illness with severe exacerbation, progression, or side effects of treatment. Patient requires drug therapy which requires intensive monitoring for toxicity.    Patient was seen and staffed with Dr. Maple Hudson.    Arelia Longest, M.D. PGY-5 Endocrinology Fellow  [facility] [place]  Phone:  [phone]     Fax:  [phone]    SUBJECTIVE:     Pamela Gutierrez is a 30 y.o. 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    Regarding her diabetes, she was diagnosed at age 7. She is currently on Tresiba 56 units at bedtime, Humalog CR 1:9 with meals, and Metformin 1.5gm daily. CGM downloaded and interpreted x14 days. Daily trends reviewed including patterns for persistently high BGs >300-400s with TAR >90%. She has had issues getting enough Freestyle Libre supplies and ran out 2 weeks ago and has been using her meter although we are unable to view all her readings.    She eats 3 meals a day. She admits  to often forgetting to take Humalog with lunch although she brings her pens to work. She says she does not have enough time at work and she is weary about giving insulin if she is unable to lift her shirt with other co-workers around. She states she nearly always takes her Humalog with breakfast and dinner and her [place]. She does not have any blurry vision but has numbness and tingling. She is currently Repatha. She has held her Atorvastatin per Cardiology.    She was admitted for CAD/STEMI in 11/2019; she also has a history of HTN but no CVA. There is family history of multiple MIs in her grandmother and her father had gangrene of his lower extremity related to diabetes.     Medical History Surgical History   Past Medical History:   Diagnosis Date   ??? Allergic    ??? Anemia    ??? Asthma    ??? Eczema    ??? Gastroparesis    ??? GERD (gastroesophageal reflux disease)    ??? Hyperlipidemia    ??? Hypertension    ??? Impaired mobility    ??? Murmur, cardiac    ??? Obesity    ??? 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 Type 2 diabetes mellitus (CMS-HCC)     BS- 90-130      Past Surgical History:   Procedure Laterality Date   ??? ABLATION OF DYSRHYTHMIC FOCUS     ??? CARDIAC CATHETERIZATION     ??? CHOLECYSTECTOMY  2015   ??? CORONARY STENT PLACEMENT     ??? ELBOW FRACTURE SURGERY     ??? PR CATH PLACE/CORON ANGIO, IMG SUPER/INTERP,W LEFT HEART VENTRICULOGRAPHY N/A 11/12/2019    Procedure: Left Heart Catheterization;  Surgeon: Alvira Philips, MD;  Location: [facility] CATH;  Service: Cardiology   ??? PR COMPRE EP EVAL ABLTJ 3D MAPG TX SVT N/A 06/04/2019    Procedure: Accessory Pathway Ablation;  Surgeon: Joretta Bachelor, MD;  Location: [facility] EP;  Service: Cardiology   ??? PR ELECTROPHYS EV,R A-V PACE/REC,W/O INDUCT N/A 06/04/2019    Procedure: Comprehensive Study W IND;  Surgeon: Joretta Bachelor, MD;  Location: [facility] EP;  Service: Cardiology   ??? PR EXC SWEAT GLAND LESN AXILL,SIMPL Right 10/30/2015    Procedure: RIGHT AXILLAE EXCISE HIDRADENITIS;  Surgeon: Oren Section, MD;  Location: [facility];  Service: Plastics   ??? PR NEGATIVE PRESSURE WOUND THERAPY DME </= 50 SQ CM N/A 10/30/2015    Procedure: WOUND VAC PLACEMENT;  Surgeon: Oren Section, MD;  Location: [facility];  Service: Plastics   ??? PR SPLIT GRFT TRUNK,ARM,LEG <100 SQCM N/A 10/30/2015    Procedure: SPLIT-THICKNESS SKIN GRAFT;  Surgeon: Oren Section, MD;  Location: [facility];  Service: Plastics Social History Family History   Social History     Tobacco Use   ??? Smoking status: Current Some Day Smoker     Types: Cigars   ??? Smokeless tobacco: Never Used   ??? Tobacco comment: Black milds   Substance Use Topics   ??? Alcohol use: Yes     Alcohol/week: 1.0 standard drink     Types: 1 Shots of liquor per week     Comment: occasional      Family History   Problem Relation Age of Onset   ??? Diabetes Mother    ??? Diabetes Father    ??? Heart disease Maternal Grandmother    ??? Diabetes Maternal Grandmother    ???  Heart disease Paternal Grandmother    ??? Melanoma Neg Hx    ??? Basal cell carcinoma Neg Hx    ??? Squamous cell carcinoma Neg Hx           Medications     Current Outpatient Medications:   ???  atorvastatin (LIPITOR) 80 MG tablet, Take 1 tablet (80 mg total) by mouth daily. (Patient taking differently: Take 80 mg by mouth daily. Patient stop for 4 weeks), Disp: 90 tablet, Rfl: 1  ???  acetaminophen (TYLENOL 8 HOUR) 650 MG CR tablet, Take 650 mg by mouth nightly., Disp: , Rfl:   ???  acyclovir (ZOVIRAX) 400 MG tablet, TAKE 1 TABLET(400 MG) BY MOUTH THREE TIMES DAILY (Patient taking differently: PRN), Disp: 90 tablet, Rfl: 0  ???  albuterol HFA 90 mcg/actuation inhaler, Inhale 2 puffs every four (4) hours as needed. , Disp: , Rfl:   ???  amLODIPine (NORVASC) 10 MG tablet, Take 1 tablet (10 mg total) by mouth daily., Disp: 90 tablet, Rfl: 3  ???  aspirin 81 MG chewable tablet, Chew 1 tablet (81 mg total) daily. (Patient taking differently: Chew 81 mg nightly.), Disp: 30 tablet, Rfl: 11  ???  BELSOMRA 10 mg tablet, TAKE 1 TABLET(10 MG) BY MOUTH EVERY NIGHT, Disp: 90 tablet, Rfl: 1  ???  blood sugar diagnostic (GLUCOSE BLOOD) Strp, Test once daily and as directed.one touch verio flex testing strips, Disp: 100 strip, Rfl: 3  ???  blood-glucose meter kit, Currently has One Touch Ultra test strips. Please issue per her formulary., Disp: 1 each, Rfl: 1  ???  cetirizine 10 mg cap, Take 10 mg by mouth daily as needed. , Disp: , Rfl:   ??? clindamycin (CLEOCIN T) 1 % lotion, Once daily to body areas that are typically affected, Disp: 60 mL, Rfl: 11  ???  clobetasoL (TEMOVATE) 0.05 % ointment, Apply topically Two (2) times a day. As needed for flares up to a week at a time, Disp: 60 g, Rfl: 3  ???  cyclobenzaprine (FLEXERIL) 10 MG tablet, TAKE 1 TABLET(10 MG) BY MOUTH EVERY NIGHT, Disp: 90 tablet, Rfl: 1  ???  empty container (SHARPS-A-GATOR DISPOSAL SYSTEM) Misc, Use as directed for sharps disposal, Disp: 1 each, Rfl: 2  ???  evolocumab (REPATHA SYRINGE) 140 mg/mL Syrg, Inject the contents of one pen (140 mg) under the skin every fourteen (14) days., Disp: 6 mL, Rfl: 3  ???  ferrous sulfate 325 (65 FE) MG tablet, Take by mouth. Once daily PRN, Disp: , Rfl:   ???  flash glucose scanning reader (FLASH GLUCOSE SCANNING READER), by Other route Take as directed. Dispense Libre 2 scanning reader, use as directed, Disp: 1 each, Rfl: 2  ???  flash glucose scanning reader (FLASH GLUCOSE SCANNING READER), by Other route Take as directed. Dispense Libre 2 scanning reader, use as directed, Disp: 3 each, Rfl: 5  ???  flash glucose sensor (FLASH GLUCOSE SENSOR) kit, Dispense Libre 2 sensors, either 1 mo supply or 3 mo supply, as per patient preference; change q 14 days, Disp: 6 each, Rfl: 3  ???  furosemide (LASIX) 40 MG tablet, TAKE 1 TABLET BY MOUTH DAILY AS NEEDED FOR> 2 POUND WEIGHT GAIN IN A 24 HOUR PERIOD (Patient taking differently: 40 mg nightly.), Disp: 90 tablet, Rfl: 1  ???  gabapentin (NEURONTIN) 300 MG capsule, Take 300 mg (1 capsule) in the morning, 300 mg at noon, and 600 mg (2 capsules) in the evening., Disp: 360 capsule, Rfl: 1  ???  hydrOXYzine (ATARAX) 25 MG tablet, TAKE 1 TABLET(25 MG) BY MOUTH EVERY 8 HOURS AS NEEDED, Disp: 90 tablet, Rfl: 1  ???  insulin ASPART (NOVOLOG FLEXPEN) 100 unit/mL (3 mL) injection pen, Inject 0.06 mL (6 Units total) under the skin Three (3) times a day before meals. Take 5 units three times per day with meals (2 units with snacks/small meals), plus 2 extra for every 50 points over 150. Up to 50 units per day., Disp: 15 mL, Rfl: 12  ???  insulin degludec (TRESIBA FLEXTOUCH U-100) 100 unit/mL (3 mL) InPn, Inject 50 units every night. Increase or decrease dose accordingly as instructed by MD. Maximum of 64 units every night. (Patient taking differently: Inject 56 units every night. Increase or decrease dose accordingly as instructed by MD. Maximum of 64 units every night.), Disp: 21 mL, Rfl: 11  ???  insulin needles, disposable, (BD ULTRA-FINE NANO PEN NEEDLES) 32 x 5/32  Ndle, Inject 1 each under the skin Five (5) times a day. Use 5x/day with Lantus, Novolog, and Victoza pens., Disp: 200 each, Rfl: 12  ???  insulin syringe-needle U-100 (BD INSULIN SYRINGE ULT-FINE II) 0.5 mL 31 gauge x 5/16 Syrg, ok to sub any brand or size insulin syringe preferred by insurance/patient, use 5x/day, dx E11.65, Disp: 200 each, Rfl: 5  ???  insulin syringe-needle U-100 0.3 mL 31 x 3/8 Syrg, For use with Humulin R AC TID., Disp: 300 Syringe, Rfl: 3  ???  losartan (COZAAR) 25 MG tablet, TAKE 1 TABLET(25 MG) BY MOUTH DAILY, Disp: 90 tablet, Rfl: 3  ???  metFORMIN (GLUCOPHAGE-XR) 500 MG 24 hr tablet, Take 2 tablets (1,000 mg total) by mouth two (2) times a day. With meals., Disp: 360 tablet, Rfl: 3  ???  metoclopramide (REGLAN) 10 MG tablet, Take 1 tablet (10 mg total) by mouth four (4) times a day as needed. Take 1 tablet 4 times daily as neeed, Disp: 120 tablet, Rfl: 3  ???  metoprolol succinate (TOPROL-XL) 100 MG 24 hr tablet, TAKE 1 TABLET(100 MG) BY MOUTH EVERY MORNING, Disp: 90 tablet, Rfl: 3  ???  montelukast (SINGULAIR) 10 mg tablet, Take 10 mg by mouth daily as needed. , Disp: , Rfl:   ???  nitroglycerin (NITROSTAT) 0.4 MG SL tablet, Place 1 tablet (0.4 mg total) under the tongue every five (5) minutes as needed for chest pain. Maximum of 3 doses in 15 minutes., Disp: 25 tablet, Rfl: 0  ???  NORLYDA 0.35 mg tablet, TAKE 1 TABLET(0.35 MG) BY MOUTH DAILY, Disp: 84 tablet, Rfl: 3  ??? omeprazole (PRILOSEC) 20 MG capsule, Take 20 mg by mouth daily., Disp: , Rfl:   ???  ondansetron (ZOFRAN-ODT) 4 MG disintegrating tablet, Take 1 tablet (4 mg total) by mouth every eight (8) hours as needed for nausea., Disp: 60 tablet, Rfl: 3  ???  prasugreL (EFFIENT) 10 mg tablet, Take 1 tablet (10 mg total) by mouth daily., Disp: 30 tablet, Rfl: 11  ???  promethazine (PHENERGAN) 25 MG tablet, TAKE 1 TABLET(25 MG) BY MOUTH EVERY 6 HOURS AS NEEDED FOR NAUSEA, Disp: 60 tablet, Rfl: 3  ???  sertraline (ZOLOFT) 50 MG tablet, Take 1.5 tablets (75 mg total) by mouth daily., Disp: 135 tablet, Rfl: 3  ???  simethicone 125 mg cap, Take 250 mg by mouth nightly., Disp: , Rfl:   ???  spironolactone (ALDACTONE) 100 MG tablet, TAKE 1 TABLET(100 MG) BY MOUTH DAILY, Disp: 90 tablet, Rfl: 1  ???  triamcinolone (KENALOG) 0.1 % cream, Apply  topically once as needed. , Disp: , Rfl:          Allergies   Allergies   Allergen Reactions   ??? Lanolin Hives   ??? Morphine (Pf) Swelling   ??? Tomato Other (See Comments) and Swelling   ??? Venom-Honey Bee Swelling   ??? Wool Hives and Swelling   ??? Ibuprofen Other (See Comments)   ??? Nsaids (Non-Steroidal Anti-Inflammatory Drug) Other (See Comments)   ??? Iodinated Contrast Media Nausea And Vomiting and Rash   ??? Ioxaglate Sodium Nausea And Vomiting          Review of Systems: 10 systems reviewed and were negative except for as noted in HPI and below.    OBJECTIVE:     Physical Exam:  BP 134/95  - Pulse 81  - Resp 14  - Ht 165.1 cm (5' 5)  - Wt 85.4 kg (188 lb 3.2 oz)  - BMI 31.32 kg/m??   General: Well-appearing African-American female in no apparent distress  HEENT: EOMI, no exophthalmos, sclera anicteric, no thyromegaly  Cardiovascular: Regular rate and rhythm; no murmurs, rubs, or gallops  Respiratory: Clear to auscultation bilaterally; no increased work of breathing on RA  Abdominal: Soft, non-distended, no lipohypertrophy  MSK: Normal station and gait; no edema  Neuro: Awake, alert, and oriented; no focal deficits; no tremors  Psych: Normal mood and affect  Skin: No abnormal skin pigmentation, no acanthosis nigricans    Labs:  I personally reviewed labs available in Epic prior to the start of today's visit.    Labs are significant for:  Lab Results   Component Value Date    A1C 12.1 (H) 10/31/2020    A1C 11.4 (H) 07/21/2020    A1C 12.2 (H) 05/15/2020    A1C 11.6 (H) 02/16/2020    A1C 11.5 (H) 11/12/2019     Lab Results   Component Value Date    TRIG 65 11/12/2019    CHOL 110 02/21/2020    HDL 24 (L) 02/21/2020    LDL 137.6 06/21/2020     Lab Results   Component Value Date    TSH 1.160 08/24/2020     Lab Results   Component Value Date    CREATININE 0.59 (L) 08/24/2020    GFRNAAF >90 03/29/2020    ALBCRERAT 3.7 12/13/2019     Lab Results   Component Value Date    MALBCRERAT 10.8 03/25/2014     Lab Results   Component Value Date    CPEPTIDE 1.56 02/16/2020     Lab Results   Component Value Date    NA 133 (L) 08/24/2020    K 4.4 08/24/2020    CL 101 08/24/2020    CO2 27.8 08/24/2020    BUN 15 08/24/2020    CREATININE 0.59 (L) 08/24/2020    GFR >= 60 04/08/2012    AST <8 08/24/2020    PROT 6.7 08/24/2020    ALBUMIN 3.5 08/24/2020     Imaging:  I personally reviewed imaging available in Epic prior to the start of today's visit.    Imaging is significant for: Echocardiogram (11/2019):    1. The left ventricle is normal in size with mildly increased wall  thickness.    2. The left ventricular systolic function is normal with no obvious wall  motion abnormalities, LVEF is visually estimated at > 55%.    3. The right ventricle is normal in size, with normal systolic function.    4. IVC size and inspiratory change  suggest mildly elevated right atrial  pressure. (5-10 mmHg).    I reviewed and summarized (above) records in preparation for today's visit all pertinent notes in Epic/Media and CareEverywhere as well as any sent records.

## 2020-10-31 ENCOUNTER — Ambulatory Visit
Admit: 2020-10-31 | Discharge: 2020-11-01 | Payer: PRIVATE HEALTH INSURANCE | Attending: Student in an Organized Health Care Education/Training Program | Primary: Student in an Organized Health Care Education/Training Program

## 2020-10-31 DIAGNOSIS — E1165 Type 2 diabetes mellitus with hyperglycemia: Principal | ICD-10-CM

## 2020-10-31 DIAGNOSIS — Z794 Long term (current) use of insulin: Principal | ICD-10-CM

## 2020-10-31 MED ORDER — FREESTYLE LIBRE 2 READER
5 refills | 0.00000 days | Status: CP
Start: 2020-10-31 — End: ?

## 2020-10-31 MED ORDER — EMPTY CONTAINER
2 refills | 0 days | Status: CN
Start: 2020-10-31 — End: ?

## 2020-10-31 NOTE — Unmapped (Signed)
One touch Meter  Downloaded and uploaded freestyle libre. POC glucose and A1C done today. PP 1500. 280 mg/dL.

## 2020-10-31 NOTE — Unmapped (Addendum)
Thank you for coming in to see [place] today!    Today we discussed your diabetes.    - Increase your Evaristo [place] to 60 units.  - Keep your Novolog and Metformin the same.  - We will see you back in 3 weeks. Try your best to remember to inject your insulin with lunch.    No orders of the defined types were placed in this encounter.

## 2020-11-06 DIAGNOSIS — N921 Excessive and frequent menstruation with irregular cycle: Principal | ICD-10-CM

## 2020-11-07 ENCOUNTER — Ambulatory Visit
Admit: 2020-11-07 | Discharge: 2020-11-08 | Payer: PRIVATE HEALTH INSURANCE | Attending: Student in an Organized Health Care Education/Training Program | Primary: Student in an Organized Health Care Education/Training Program

## 2020-11-07 ENCOUNTER — Ambulatory Visit: Admit: 2020-11-07 | Discharge: 2020-11-08 | Payer: PRIVATE HEALTH INSURANCE

## 2020-11-07 NOTE — Unmapped (Signed)
Pamela Gutierrez is here today for follow of bilateral leg MRI. She now has braces on both feet to assit with stabilizing her ankles so she can walk.  Continues to have falls at home.  Denies c/p, blood sugars are still up and down.

## 2020-11-07 NOTE — Unmapped (Signed)
DIVISION OF CARDIOLOGY   [facility], [place]                                                                         Date of Service:  11/07/2020     Assessment/Plan   1. Coronary artery disease, unspecified vessel or lesion type, unspecified whether angina present, unspecified whether native or transplanted heart  Patient remains on prasugrel and aspirin for secondary prevention after her MI.  Most recent LDL of 137.6. She was temporarily taken off of atorvastatin due to her leg pain, however this did not improve her symptoms. Will restart atorvastatin 80mg  today.Will discontinue prasugrel at the end of the month given she is approaching the 1 year mark since her STEMI and has upcoming procedure in 12/2020.     2. Nontraumatic ischemic infarction of muscle   MRI RLE showed  regions of patchy edema and enhancement of the right soleus and left gastrocnemius could represent myositis or infarct related muscle changes. Also with low grade edema otherwise possibly related to a neuropathic process.She wants to attend cardiac rehab but would need transportation from her mother's work to the rehab center. I will discuss this with care management to set up transportation.   - MRI Lower Extremity Non-Joint Right W Contrast; Future    Return to clinic:  Return in about 4 months (around 03/07/2021).    I personally spent 33 minutes face-to-face and non-face-to-face in the care of this patient, which includes all pre, intra, and post visit time on the date of service.     Subjective:   ZOX:WRUEA Duaine Dredge, MD  Chief complaint:  30 y.o. female with a history of T2DM, GERD, asthma, anemia, HTN, STEMI and WPW presents for follow up.    History of Present Illness:  Patient with history of WPW which was identified during her admission on 05/2019 for syncopal event. She had multiple syncopal events since age 65. She followed up with Dr. Excell Seltzer as an outpatient and had catheter ablation of the accessory pathway on 06/04/19. Admission on 11/12/19 for evaluation of chest pain, nausea, and vomiting, found to have proximal RCA occlusion s/p PCI with DES. Echo showed EF of 55% with no obvious wall motion abnormalities, and grade 1 DD. Patient was started on DAPT with prasugrel and aspirin, as well as GDMT with atorvastatin 80 mg daily, losartan 12.5 mg daily, metoprolol succinate 100 mg daily. She saw her PCP on 12/13/19 and losartan was increased to 25 mg daily. She was seen on 12/15/19 by endocrinology, and had modification of her diabetes care including initiation of Ozempic, and modification of her insulin. She started cardiac rehab on 01/20/20.    At PCP appointment on 02/21/20 her diabetes remained uncontrolled with most recent A1c of 11.5% in association with discontinuing insulin due to illness. She had since resumed long-acting insulin and increased Ozempic to 0.5 mg weekly. She continues to follow with endocrinology. She had two ED visits on 03/20/20 and 03/22/20 for evaluation of nausea and vomiting, and chest pain. She had downtrending troponins. EKG with no evidence of ST elevation MI, and unchanged from priors. Pain improved during first ED visit with GI cocktail pain. She was last seen in clinic  on 04/14/20 endorsing DOE so nuclear stress test was ordered which was a normal myocardial perfusion study, no evidence of any significant ischemia or scar, and post stress EF > 60%.    Patient was seen in clinic by me on 05/30/20 and reported ongoing chest pain. She planned to restart cardiac rehab. No changes made to daily medications. Patient was seen in the ED on 08/24/20 after a presyncopal episode with lightheadedness, diaphoresis, and fatigue. No LOC. EKG showed sinus rhythm with 2 mm ST elevation noted in V2, which appeared morphologically similar to previous ECGs. CMP showed glucose elevated to 531. She was discharged with recs to follow up closely with cardiology and endocrinology.    Patient was last seen in clinic by me on 09/01/20 and reported sudden onset of pain to her right calf several months ago with weakness in her lower legs. MRI RLE showed diffuse edema throughout the lower extremity musculature, nonspecific though can be seen in chronic neuropathic change. Additional regions of patchy edema and enhancement of the right soleus and left gastrocnemius could represent myositis or infarct related muscle changes. Most recent device check from 09/26/20 showed normal device function, EGMs reviewed. Event recorded as VT was sinus tachycardia with noise/artifact. Patient reported this correlated with overnight elevated HR watching movies with her friends.     Patient presents today with her mother and reports no changes in her leg symptoms since discontinuing atorvastatin. She continues to have leg weakness and pain which comes and goes. Patient has new lower leg braces that have helped her tolerate walking more. Occasional palpitations but no syncope or lightheadedness. Patient would like to attend cardiac rehab but she is concerned about transportation difficulties. She still works full time M-F, but her brother drives her to work, and her mother drives her home. Patient lives in [place]. Her mother works for [employer]. She already received her flu shot. Patient is scheduled for surgery on 12/15/20 and wants to know if she can come off blood thinners for this. She has presurgical appointment on 11/23/20.     Past Medical History  Patient Active Problem List   Diagnosis   ??? Type 2 diabetes mellitus with hyperglycemia, with long-term current use of insulin (CMS-HCC)   ??? GERD (gastroesophageal reflux disease)   ??? Mild intermittent asthma without complication   ??? Seasonal allergies   ??? Anemia   ??? Hypertension   ??? Gastroparesis due to DM (CMS-HCC)   ??? Syncope   ??? WPW (Wolff-Parkinson-White syndrome)   ??? ST elevation myocardial infarction involving right coronary artery (CMS-HCC)   ??? Chest pain   ??? Pure hypercholesterolemia   ??? Left foot drop   ??? Hidradenitis suppurativa   ??? Insomnia   ??? Polyneuropathy associated with underlying disease (CMS-HCC)   ??? Moderate episode of recurrent major depressive disorder (CMS-HCC)   ??? Cervical high risk HPV (human papillomavirus) test positive       Medications:  Current Outpatient Medications   Medication Sig Dispense Refill   ??? acetaminophen (TYLENOL 8 HOUR) 650 MG CR tablet Take 650 mg by mouth nightly.     ??? acyclovir (ZOVIRAX) 400 MG tablet TAKE 1 TABLET(400 MG) BY MOUTH THREE TIMES DAILY (Patient taking differently: PRN) 90 tablet 0   ??? albuterol HFA 90 mcg/actuation inhaler Inhale 2 puffs every four (4) hours as needed.      ??? amLODIPine (NORVASC) 10 MG tablet Take 1 tablet (10 mg total) by mouth daily. 90 tablet 3   ???  aspirin 81 MG chewable tablet Chew 1 tablet (81 mg total) daily. (Patient taking differently: Chew 81 mg nightly.) 30 tablet 11   ??? BELSOMRA 10 mg tablet TAKE 1 TABLET(10 MG) BY MOUTH EVERY NIGHT 90 tablet 1   ??? cetirizine 10 mg cap Take 10 mg by mouth daily as needed.      ??? clindamycin (CLEOCIN T) 1 % lotion Once daily to body areas that are typically affected 60 mL 11   ??? clobetasoL (TEMOVATE) 0.05 % ointment Apply topically Two (2) times a day. As needed for flares up to a week at a time 60 g 3   ??? cyclobenzaprine (FLEXERIL) 10 MG tablet TAKE 1 TABLET(10 MG) BY MOUTH EVERY NIGHT 90 tablet 1   ??? evolocumab (REPATHA SYRINGE) 140 mg/mL Syrg Inject the contents of one pen (140 mg) under the skin every fourteen (14) days. 6 mL 3   ??? ferrous sulfate 325 (65 FE) MG tablet Take by mouth. Once daily PRN     ??? furosemide (LASIX) 40 MG tablet TAKE 1 TABLET BY MOUTH DAILY AS NEEDED FOR> 2 POUND WEIGHT GAIN IN A 24 HOUR PERIOD (Patient taking differently: 40 mg nightly.) 90 tablet 1   ??? gabapentin (NEURONTIN) 300 MG capsule Take 300 mg (1 capsule) in the morning, 300 mg at noon, and 600 mg (2 capsules) in the evening. 360 capsule 1   ??? hydrOXYzine (ATARAX) 25 MG tablet TAKE 1 TABLET(25 MG) BY MOUTH EVERY 8 HOURS AS NEEDED 90 tablet 1   ??? insulin ASPART (NOVOLOG FLEXPEN) 100 unit/mL (3 mL) injection pen Inject 0.06 mL (6 Units total) under the skin Three (3) times a day before meals. Take 5 units three times per day with meals (2 units with snacks/small meals), plus 2 extra for every 50 points over 150. Up to 50 units per day. 15 mL 12   ??? insulin degludec (TRESIBA FLEXTOUCH U-100) 100 unit/mL (3 mL) InPn Inject 50 units every night. Increase or decrease dose accordingly as instructed by MD. Maximum of 64 units every night. (Patient taking differently: Inject 56 units every night. Increase or decrease dose accordingly as instructed by MD. Maximum of 64 units every night.) 21 mL 11   ??? losartan (COZAAR) 25 MG tablet TAKE 1 TABLET(25 MG) BY MOUTH DAILY 90 tablet 3   ??? metFORMIN (GLUCOPHAGE-XR) 500 MG 24 hr tablet Take 2 tablets (1,000 mg total) by mouth two (2) times a day. With meals. 360 tablet 3   ??? metoclopramide (REGLAN) 10 MG tablet Take 1 tablet (10 mg total) by mouth four (4) times a day as needed. Take 1 tablet 4 times daily as neeed 120 tablet 3   ??? metoprolol succinate (TOPROL-XL) 100 MG 24 hr tablet TAKE 1 TABLET(100 MG) BY MOUTH EVERY MORNING 90 tablet 3   ??? montelukast (SINGULAIR) 10 mg tablet Take 10 mg by mouth daily as needed.      ??? NORLYDA 0.35 mg tablet TAKE 1 TABLET(0.35 MG) BY MOUTH DAILY 84 tablet 3   ??? omeprazole (PRILOSEC) 20 MG capsule Take 20 mg by mouth daily.     ??? ondansetron (ZOFRAN-ODT) 4 MG disintegrating tablet Take 1 tablet (4 mg total) by mouth every eight (8) hours as needed for nausea. 60 tablet 3   ??? promethazine (PHENERGAN) 25 MG tablet TAKE 1 TABLET(25 MG) BY MOUTH EVERY 6 HOURS AS NEEDED FOR NAUSEA 60 tablet 3   ??? sertraline (ZOLOFT) 50 MG tablet Take 1.5 tablets (75 mg total)  by mouth daily. 135 tablet 3   ??? simethicone 125 mg cap Take 250 mg by mouth nightly.     ??? spironolactone (ALDACTONE) 100 MG tablet TAKE 1 TABLET(100 MG) BY MOUTH DAILY 90 tablet 1   ??? triamcinolone (KENALOG) 0.1 % cream Apply topically once as needed.      ??? atorvastatin (LIPITOR) 80 MG tablet Take 1 tablet (80 mg total) by mouth daily. (Patient taking differently: Take 80 mg by mouth daily. Patient stop for 4 weeks) 90 tablet 1   ??? blood sugar diagnostic (GLUCOSE BLOOD) Strp Test once daily and as directed.one touch verio flex testing strips 100 strip 3   ??? blood-glucose meter kit Currently has One Touch Ultra test strips. Please issue per her formulary. 1 each 1   ??? empty container (SHARPS-A-GATOR DISPOSAL SYSTEM) Misc Use as directed for sharps disposal 1 each 2   ??? flash glucose scanning reader (FLASH GLUCOSE SCANNING READER) by Other route Take as directed. Dispense Libre 2 scanning reader, use as directed 1 each 2   ??? flash glucose scanning reader (FLASH GLUCOSE SCANNING READER) by Other route Take as directed. Dispense Libre 2 scanning reader, use as directed 3 each 5   ??? flash glucose sensor (FLASH GLUCOSE SENSOR) kit Dispense Libre 2 sensors, either 1 mo supply or 3 mo supply, as per patient preference; change q 14 days 6 each 3   ??? insulin needles, disposable, (BD ULTRA-FINE NANO PEN NEEDLES) 32 x 5/32  Ndle Inject 1 each under the skin Five (5) times a day. Use 5x/day with Lantus, Novolog, and Victoza pens. 200 each 12   ??? insulin syringe-needle U-100 (BD INSULIN SYRINGE ULT-FINE II) 0.5 mL 31 gauge x 5/16 Syrg ok to sub any brand or size insulin syringe preferred by insurance/patient, use 5x/day, dx E11.65 200 each 5   ??? insulin syringe-needle U-100 0.3 mL 31 x 3/8 Syrg For use with Humulin R AC TID. 300 Syringe 3   ??? nitroglycerin (NITROSTAT) 0.4 MG SL tablet Place 1 tablet (0.4 mg total) under the tongue every five (5) minutes as needed for chest pain. Maximum of 3 doses in 15 minutes. (Patient not taking: Reported on 11/07/2020) 25 tablet 0     No current facility-administered medications for this visit.       Allergies  Allergies   Allergen Reactions ??? Lanolin Hives   ??? Morphine (Pf) Swelling   ??? Tomato Other (See Comments) and Swelling   ??? Venom-Honey Bee Swelling   ??? Wool Hives and Swelling   ??? Ibuprofen Other (See Comments)   ??? Nsaids (Non-Steroidal Anti-Inflammatory Drug) Other (See Comments)   ??? Iodinated Contrast Media Nausea And Vomiting and Rash   ??? Ioxaglate Sodium Nausea And Vomiting       Social History:   Social History     Tobacco Use   ??? Smoking status: Current Some Day Smoker     Types: Cigars   ??? Smokeless tobacco: Never Used   ??? Tobacco comment: Black milds   Vaping Use   ??? Vaping Use: Never used   Substance Use Topics   ??? Alcohol use: Yes     Alcohol/week: 1.0 standard drink     Types: 1 Shots of liquor per week     Comment: occasional   ??? Drug use: Yes     Frequency: 2.0 times per week     Types: Marijuana       Family History:  Family History   Problem  Relation Age of Onset   ??? Diabetes Mother    ??? Diabetes Father    ??? Heart disease Maternal Grandmother    ??? Diabetes Maternal Grandmother    ??? Heart disease Paternal Grandmother    ??? Melanoma Neg Hx    ??? Basal cell carcinoma Neg Hx    ??? Squamous cell carcinoma Neg Hx        ROS- 12 system review is negative other than what is specified in the History of Present Illness.      Objective:   Physical Exam  Vitals:    11/07/20 1558   BP: 144/95   Pulse: 99   Resp: 18   Temp: 35.7 ??C (96.3 ??F)   SpO2: 99%   Weight: 82.7 kg (182 lb 6.4 oz)   Height: 166.4 cm (5' 5.5)     General-  Normal appearing female in no apparent distress. Ambulating with walker.   Neurologic- Alert and oriented X3.  Cranial nerve II-XII grossly intact.   HEENT-  Normocephalic atraumatic head.  No scleral icterus.  Wearing face mask.  Neck- Supple, no JVD.  Lungs- Clear to auscultation, no wheezes, rhonchi, or rhales.  Heart-  RRR, no obvious murmur.   Abdomen- Soft, nontender, no organomegally.  Extremities-  No clubbing or cyanosis. Bilateral lower leg braces in place.   Pulses- 2+ pulses in radial bilaterally.  Psych- Normal mood, appropriate.     Laboratory data:    I have personally reviewed the images of the following diagnostic studies.    Electrocardiogram:  From 03/22/20 showed NSR, normal ECG.    From 08/24/20 showed NSR, inferior infarct.     Echocardiogram:  From 05/13/19 was a technically difficult study. LV normal in size with mildly increased wall thickness. LV systolic function is hyperdynamic with a small end-systolic volume, LVEF > 70%. RV normal in size, with normal systolic function. No significant valvular abnormalities.    From 11/13/19 showed LV normal in size with normal wall thickness, LVEF 55%. Grade I DD (impaired relaxation). RV normal in size, with normal systolic function.    From 11/19/19 showed LV normal in size with mildly increased wall thickness, LVEF > 55%. RV normal in size with normal systolic function. IVC size and inspiratory change suggest mildly elevated RA pressure. (5-10 mmHg).    Nuclear stress test:  From 05/01/20 showed normal myocardial perfusion study. No evidence of any significant ischemia or scar. Left ventricular systolic function is normal. Post stress the EF is > 60%. Attenuation CT scan shows post PCI findings and post cholecystectomy findings. Minor non-specific ST and T wave changes noted during stress.    Cardiac catheterization:  From 11/12/19 showed coronary artery disease including??proximal occlusion of RCA, 25% mid-LAD stenosis and 80% OM1 stenosis. Successful PCI of proximal??RCA??with placement of an Onyx??drug eluting stent. Normal LVEDP at 10 mm Hg    Device check:  From 09/26/20 showed normal device function, EGMs reviewed. Event recorded as VT was sinus tachycardia with noise/artifact.    Lipid panel:  Component      Latest Ref Rng & Units 06/21/2020   LDL Direct      mg/dL 010.2     Component      Latest Ref Rng & Units 11/12/2019   Cholesterol      <=200 mg/dL 725   Triglycerides      0 - 150 mg/dL 65   HDL      40 - 60 mg/dL 27 (L)   LDL  calculated      40 - 99 mg/dL 87 VLDL Cholesterol Cal      8 - 29 mg/dL 13   Chol/HDL Ratio      1.0 - 4.5 4.7 (H)   Non-HDL Cholesterol      70 - 098 mg/dL 119   FASTING       Unknown     Documentation assistance was provided by Halina Maidens, Scribe on November 06, 2020 at 5:44 PM for Joneen Roach, MD.     I have reviewed the documentation provided by the scribe and confirm that it accurately reflects the service I personally performed and the decisions made by me.  Signature: CSD  Date: 11/07/2020  Time: 4:59 PM

## 2020-11-09 ENCOUNTER — Ambulatory Visit: Admit: 2020-11-09 | Discharge: 2020-11-10 | Payer: PRIVATE HEALTH INSURANCE

## 2020-11-09 NOTE — Unmapped (Signed)
[facility]  Encounter Provider: Ancil Linsey, NP  Date of Service: 11/09/2020 Last encounter Orthopaedics: 09/28/2020   Last encounter this provider: 09/28/2020            Primary Care Provider: Sherol Dade, MD  Referring Provider: Referred Self    ICD-10-CM   1. Polyneuropathy associated with underlying disease (CMS-HCC)  G63   2. Foot drop, bilateral  M21.371    M21.372    Orthopaedic notes: No specialty comments available.    Physical Function CAT Score: 37.8 (11/09/20)  Pain Interference CAT Score: 59.7 (11/09/20)  Depression CAT Score: (not recorded)  Sleep CAT Score: (not recorded)  [email or web address]     [name] Sadiq is a 30 y.o. female   ASSESSMENT     Poorly controlled diabetes with bilateral foot drop        PLAN:     Continue diabetic shoes with AFOs.  Monitor for any skin issues.  Right heel callus was debrided using 15 blade.  Patient tolerated this well.  No underlying ulcer.  She elected to go ahead and schedule follow-up in 3 months but will see me sooner if any issues.  Discussed regular skin care    Requested Prescriptions      No prescriptions requested or ordered in this encounter      No orders of the defined types were placed in this encounter.      History:  (Data reviewed and verified by encounter provider.)  Chief Complaint   Patient presents with   ??? Right Foot - Follow-up     Braces  Braces adjusted 3 weeks ago and doing so much better and able to wear them for much longer.      ??? Left Foot - Follow-up     HPI:  30 y.o. female who reports that she is doing well with her new shoes and AFOs.  She does have a thick callus over her right heel           Exam:  The primary encounter diagnosis was Polyneuropathy associated with underlying disease (CMS-HCC). A diagnosis of Foot drop, bilateral was also pertinent to this visit.   Estimated body mass index is 29.89 kg/m?? as calculated from the following:    Height as of 11/07/20: 166.4 cm (5' 5.5).    Weight as of 11/07/20: 82.7 kg (182 lb 6.4 oz).     Musculoskeletal   ??? No active dorsiflexion at either ankle.  Skin is intact her bilateral feet but dry       Pulses well-perfused distally, 2+ DP and posterior tibial pulses    Swelling mild swelling    Neurologic Sensation to light touch distally diminished   Skin Benign, no lesions        Test Results  The primary encounter diagnosis was Polyneuropathy associated with underlying disease (CMS-HCC). A diagnosis of Foot drop, bilateral was also pertinent to this visit.  Lab Results   Component Value Date    A1C 12.1 (H) 10/31/2020       No results found for: VITD    Imaging  No orders of the defined types were placed in this encounter.          DME ORDER:  Dx:  ,

## 2020-11-14 NOTE — Unmapped (Signed)
I saw and evaluated the patient, participating in the key portions of the service.  I reviewed the resident/fellow’s note.  I agree with the resident/fellow’s findings and plan.   Charlet Harr Anne Lakshmi Sundeen, MD, PhD  Associate Professor of Medicine  Division of Endocrinology  [phone]

## 2020-11-16 NOTE — Unmapped (Unsigned)
No answer when called for traige, phone has a fast busy signal.

## 2020-11-17 ENCOUNTER — Ambulatory Visit: Admit: 2020-11-17 | Discharge: 2020-11-18 | Payer: PRIVATE HEALTH INSURANCE

## 2020-11-17 DIAGNOSIS — K5900 Constipation, unspecified: Principal | ICD-10-CM

## 2020-11-17 DIAGNOSIS — E1143 Type 2 diabetes mellitus with diabetic autonomic (poly)neuropathy: Principal | ICD-10-CM

## 2020-11-17 DIAGNOSIS — K3184 Gastroparesis: Principal | ICD-10-CM

## 2020-11-17 LAB — COMPREHENSIVE METABOLIC PANEL
ALBUMIN: 4.1 g/dL (ref 3.4–5.0)
ALKALINE PHOSPHATASE: 86 U/L (ref 46–116)
ALT (SGPT): 13 U/L (ref 10–49)
ANION GAP: 6 mmol/L (ref 5–14)
AST (SGOT): 14 U/L (ref <=34)
BILIRUBIN TOTAL: 0.4 mg/dL (ref 0.3–1.2)
BLOOD UREA NITROGEN: 18 mg/dL (ref 9–23)
BUN / CREAT RATIO: 41
CALCIUM: 9.8 mg/dL (ref 8.7–10.4)
CHLORIDE: 99 mmol/L (ref 98–107)
CO2: 31.4 mmol/L — ABNORMAL HIGH (ref 20.0–31.0)
CREATININE: 0.44 mg/dL — ABNORMAL LOW
EGFR CKD-EPI (2021) FEMALE: >90 mL/min/{1.73_m2} (ref >=60)
GLUCOSE RANDOM: 338 mg/dL — ABNORMAL HIGH (ref 70–179)
POTASSIUM: 4.5 mmol/L (ref 3.4–4.8)
PROTEIN TOTAL: 7.5 g/dL (ref 5.7–8.2)
SODIUM: 136 mmol/L (ref 135–145)

## 2020-11-17 NOTE — Unmapped (Addendum)
Pamela Gutierrez,  We discussed today about your symptoms of gastroparesis and constipation.     We will repeat the gastric emptying study. This will help [place] decide about any changes to gastroparesis treatment.      2.  START daily miralax. Start with 1 capful daily. Goal is to titrate to 1 BM every day, to every other day.         Please make sure to follow up with me in 6 months. If our schedulers do not call you to schedule this appointment, please call and make the appointment using the GI clinic Appointments number below.     If your symptoms acutely worsen, or you develop new symptoms that we did not discuss, please call us (my nurse contact below is [place], which you can call 8AM-4PM Monday through Friday). If it is after hours or on the weekend, please call the hospital operator: [phone] and ask for the Gastroenterology fellow on call.         Helpful phone numbers:     Nurse Contact: Robin Royster  p: [phone] - f: [phone]     GI Clinic Appointments:  [phone]  Please call the GI Clinic appointment line if you need to schedule, reschedule or cancel an appointment in clinic. They can also answer any questions you may have about where your appointment is located and when you need to arrive.       GI Procedure Appointments:  [phone]  Please call the GI Procedures line if you need to schedule, reschedule or cancel any type of GI procedure (endoscopy, colonoscopy, motility testing, etc).  You can also call this number for prep instructions, etc.       Radiology: [phone], option 3  If you are being scheduled for any type of radiology study, you will need to call to receive your appointment time.  Please call this number for information.        For emergencies after normal business hours or on weekends/holidays   Proceed to the nearest emergency room OR contact the [facility] Operator at [phone] who can page the Gastroenterology Fellow on call.           Survey  Please complete a survey about your visit today at:  [email or web address]

## 2020-11-17 NOTE — Unmapped (Signed)
[facility] at [facility]  Initial Consultation Visit    Reason for Visit: gastroparesis  Referring Provider: Loistine Chance   Primary Care Provider: Sherol Dade, MD    Assessment & Plan: Pamela Gutierrez is a 30 y.o. female with PMHx of T2DM (last A1c 11.4 07/20/20), gastroparesis, GERD, asthma, WPW and CAD (s/p STEMI with PCI, 11/2019 ), hypercholesterolemia, HS, and MDD that is seen in consultation at the request of Loistine Chance for gastroparesis.    # gastroparesis  Micki reports a 7 year history of gastroparesis in the setting of poorly controlled diabetes. Symptoms were previously once annually. She has been treated with Reglan she takes 10mg  BID. She uses zofran (morning) and phenergan (night) every day. She reports that her GI symptoms worsened after her second COVID diagnosis Dec 2021. The peak of severity seemed to occur sometime around 3-04/2020. She indicates that symptoms have improved significantly over the past month. 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 I also suspect that her recent symptoms suggestive of constipation -- as below -- may contribute to why she hasn't returned completely to her normal. Gastroparesis was diagnosed based on studies in 2015, which showed only mild defect at 1 -2 hrs. We dicussed that of course the key to preventing worsening of her gastroparesis is improving her diabetes control, which she is working on with the help of her endocrinology team. We also discussed reassessing the severity of her current level of gastroparesis with repeat gastric emptying study. If severe progression, she may benefit from more aggressive therapy - including increased frequency of reglan dosing vs trial of erythromycin, which she indicates she has not tried previously.   - repeat NM gastric emptying study - behavioral changes including more frequent smaller meal consumption (instead of 2 larger meals) and blending of food is encouraged   - consider erythromycin trial vs increase reglan frequency, pending NM study as above   - if epigastric discomfort does recur, may benefit from EGD after complete serologic workup.     # concern for constipation  Micki reports several days of feeling consitpated recently. She has had alternated between large, formed stool, and small hard pellet like stools. She gave have a BM up to 5 times a day-- those days when she has frequent BMs, stools are hard and pellet like (not watery or bloody). She has not tried a laxative in the past.   - repeat CMP  - START miralax with goal to have 1-2 BMs every day, soft in quality     I spent a total of 60 minutes on the date of this encounter in the delivery of care to this patient.     Patient seen and discussed with Dr. Marcella Dubs who is in agreement with above assessment and plan.    Dolores Hoose, MD PHD  Fellow Physician - Division of Gastroenterology & Hepatology  [facility]       Return in about 6 months (around 05/17/2021).       Subjective   HPI: Pamela Norlander Digangi is a 30 y.o. female with a PMHx of T2DM (last A1c 11.4 07/20/20), gastroparesis, GERD, asthma, WPW and CAD (s/p STEMI with PCI, 11/2019 ), hypercholesterolemia, HS, and MDD that is seen in consultation at the request of Loistine Chance for gastroparesis.     Per PCP  note 03/29/20: Micki experienced an increase from baseline vomiting starting mid February, which resulted in 2 ED visits on 03/20/20 and 03/22/20.  Labs have been grossly normal without evidence of pancreatitis.Marland KitchenMarland KitchenShe had abdominal tenderness in epigastrium, right upper quadrant, and right lower quadrant today which she reports is new in the past few days.  Epigastric pain may be suggestive of esophageal etiology such as ulcer or gastroparesis, though this would not explain her concurrent lower abdominal tenderness.  Right-sided abdominal discomfort with rebound is concerning for appendicitis; also question this in the setting of nausea and vomiting, mild fever, clammy skin, and loss of appetite.  Referred for CT abdomen/pelvis, which did not show anything concerning in the abdomen.  There was a slightly abnormal appearance of her cervix, which is nonspecific.    Over the subsequent 2 months-3 months, she had daily nausea and weekly vomiting, but symptoms had improved improved  Follows with [facility]. Continues metformin 500 mg twice daily, Tresiba 36 unites daily, and aspart 6 units three times daily. She discontinued Ozempic 0.5 mg daily because they felt it may have aggrevated her gastroparesis    ------------------------------------------------------------------------------------------------------------------    She established care with me 08/2020. At the time, she reported increased frequency of episode of flares of worsening gastroparesis symptoms following COVID diagnosis in December. Within 6 months, symptoms had gotten significantly better, though she did report some ongoing vomiting about once per week. She also reported significant symptoms of constipation, alternating between small pellet stools with occasional loose stools.     We discussed a plan of interim evaluation of gastroparesis (last study 2015, with borderline gastroparesis) with expectation she may need augmentation of therapy in the future. We also discussed management of constipation with miralax; she has not been able to do either measure.     Today she reports overall she feels that things are better than when I last saw her.   Eating 2 meals currently. She has been eating mostly small meals. She eats Location manager breakfast bowls, maybe 2 smuckers sandwhiches for lunch, often chicken or fish with some vegetables and potatoes for dinner.    BM have been intermittent -- some days she struggles to have a BM and then some days she has rabbit pellets. Some days she has loose stools. BM on days when she has loose stool, she will have 4-8, which are watery in quality. No blood or mucus in stools.     Weight has gone up 10 lbs; she attributes this to being able to eat more.   She is dtill taking the 10mg  BID. When she has increased the dose in the past, she expreiences twiching.   Nausea seems to come and go currently. No vomiting in the past month.     Review of Systems:  10 systems were reviewed and are negative unless otherwise mentioned in the HPI    Allergies:  Lanolin, Morphine (pf), Tomato, Venom-honey bee, Wool, Ibuprofen, Nsaids (non-steroidal anti-inflammatory drug), Iodinated contrast media, and Ioxaglate sodium    Medications:   Prior to Admission medications    Medication Dose, Route, Frequency   acetaminophen (TYLENOL 8 HOUR) 650 MG CR tablet 650 mg, Oral, Nightly   acyclovir (ZOVIRAX) 400 MG tablet TAKE 1 TABLET(400 MG) BY MOUTH THREE TIMES DAILY  Patient taking differently: PRN   albuterol HFA 90 mcg/actuation inhaler 2 puffs, Inhalation, Every 4 hours PRN   amLODIPine (NORVASC) 10 MG tablet 10 mg, Oral, Daily (standard)   aspirin 81 MG chewable  tablet 81 mg, Oral, Daily (standard)  Patient taking differently: Chew 81 mg nightly.   atorvastatin (LIPITOR) 80 MG tablet 80 mg, Oral, Daily (standard)  Patient taking differently: Take 80 mg by mouth daily. Patient stop for 4 weeks   BELSOMRA 10 mg tablet TAKE 1 TABLET(10 MG) BY MOUTH EVERY NIGHT   blood sugar diagnostic (GLUCOSE BLOOD) Strp Test once daily and as directed.one touch verio flex testing strips   blood-glucose meter kit Currently has One Touch Ultra test strips. Please issue per her formulary.   cetirizine 10 mg cap 10 mg, Oral, Daily PRN   clindamycin (CLEOCIN T) 1 % lotion Once daily to body areas that are typically affected   clobetasoL (TEMOVATE) 0.05 % ointment Topical, 2 times a day (standard), As needed for flares up to a week at a time   cyclobenzaprine (FLEXERIL) 10 MG tablet TAKE 1 TABLET(10 MG) BY MOUTH EVERY NIGHT   empty container (SHARPS-A-GATOR DISPOSAL SYSTEM) Misc Use as directed for sharps disposal   evolocumab (REPATHA SYRINGE) 140 mg/mL Syrg Inject the contents of one pen (140 mg) under the skin every fourteen (14) days.   ferrous sulfate 325 (65 FE) MG tablet Oral, Once daily PRN   flash glucose scanning reader (FLASH GLUCOSE SCANNING READER) Other, Take as directed, Dispense Libre 2 scanning reader, use as directed   flash glucose scanning reader (FLASH GLUCOSE SCANNING READER) Other, Take as directed, Dispense Libre 2 scanning reader, use as directed   flash glucose sensor (FLASH GLUCOSE SENSOR) kit Dispense Libre 2 sensors, either 1 mo supply or 3 mo supply, as per patient preference; change q 14 days   furosemide (LASIX) 40 MG tablet TAKE 1 TABLET BY MOUTH DAILY AS NEEDED FOR> 2 POUND WEIGHT GAIN IN A 24 HOUR PERIOD  Patient taking differently: 40 mg nightly.   gabapentin (NEURONTIN) 300 MG capsule Take 300 mg (1 capsule) in the morning, 300 mg at noon, and 600 mg (2 capsules) in the evening.   hydrOXYzine (ATARAX) 25 MG tablet TAKE 1 TABLET(25 MG) BY MOUTH EVERY 8 HOURS AS NEEDED   insulin ASPART (NOVOLOG FLEXPEN) 100 unit/mL (3 mL) injection pen 6 Units, Subcutaneous, 3 times a day (AC), Take 5 units three times per day with meals (2 units with snacks/small meals), plus 2 extra for every 50 points over 150. Up to 50 units per day.   insulin degludec (TRESIBA FLEXTOUCH U-100) 100 unit/mL (3 mL) InPn Inject 50 units every night. Increase or decrease dose accordingly as instructed by MD. Maximum of 64 units every night.  Patient taking differently: Inject 56 units every night. Increase or decrease dose accordingly as instructed by MD. Maximum of 64 units every night.   insulin needles, disposable, (BD ULTRA-FINE NANO PEN NEEDLES) 32 x 5/32  Ndle 1 each, Subcutaneous, 5 times a day, Use 5x/day with Lantus, Novolog, and Victoza pens.   insulin syringe-needle U-100 (BD INSULIN SYRINGE ULT-FINE II) 0.5 mL 31 gauge x 5/16 Syrg ok to sub any brand or size insulin syringe preferred by insurance/patient, use 5x/day, dx E11.65   insulin syringe-needle U-100 0.3 mL 31 x 3/8 Syrg For use with Humulin R AC TID.   losartan (COZAAR) 25 MG tablet TAKE 1 TABLET(25 MG) BY MOUTH DAILY   metFORMIN (GLUCOPHAGE-XR) 500 MG 24 hr tablet 1,000 mg, Oral, 2 times a day, With meals.   metoclopramide (REGLAN) 10 MG tablet 10 mg, Oral, 4 times a day PRN, Take 1 tablet 4 times daily as neeed  metoprolol succinate (TOPROL-XL) 100 MG 24 hr tablet TAKE 1 TABLET(100 MG) BY MOUTH EVERY MORNING   montelukast (SINGULAIR) 10 mg tablet 10 mg, Oral, Daily PRN   nitroglycerin (NITROSTAT) 0.4 MG SL tablet 0.4 mg, Sublingual, Every 5 min PRN, Maximum of 3 doses in 15 minutes.  Patient not taking: Reported on 11/07/2020   NORLYDA 0.35 mg tablet TAKE 1 TABLET(0.35 MG) BY MOUTH DAILY   omeprazole (PRILOSEC) 20 MG capsule 20 mg, Oral, Daily (standard)   ondansetron (ZOFRAN-ODT) 4 MG disintegrating tablet 4 mg, Oral, Every 8 hours PRN   promethazine (PHENERGAN) 25 MG tablet TAKE 1 TABLET(25 MG) BY MOUTH EVERY 6 HOURS AS NEEDED FOR NAUSEA   sertraline (ZOLOFT) 50 MG tablet 75 mg, Oral, Daily (standard)   simethicone 125 mg cap 250 mg, Oral, Nightly   spironolactone (ALDACTONE) 100 MG tablet TAKE 1 TABLET(100 MG) BY MOUTH DAILY   triamcinolone (KENALOG) 0.1 % cream Topical, Once as needed       Medical History:  Past Medical History:   Diagnosis Date   ??? Allergic    ??? Anemia    ??? Asthma    ??? Eczema    ??? Gastroparesis    ??? GERD (gastroesophageal reflux disease)    ??? Hyperlipidemia    ??? Hypertension    ??? Impaired mobility    ??? Murmur, cardiac    ??? Obesity    ??? 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 Type 2 diabetes mellitus (CMS-HCC)     BS- 90-130       Surgical History:  Past Surgical History:   Procedure Laterality Date   ??? ABLATION OF DYSRHYTHMIC FOCUS     ??? CARDIAC CATHETERIZATION     ??? CHOLECYSTECTOMY  2015   ??? CORONARY STENT PLACEMENT     ??? ELBOW FRACTURE SURGERY     ??? PR CATH PLACE/CORON ANGIO, IMG SUPER/INTERP,W LEFT HEART VENTRICULOGRAPHY N/A 11/12/2019    Procedure: Left Heart Catheterization;  Surgeon: Alvira Philips, MD;  Location: [facility];  Service: Cardiology   ??? PR COMPRE EP EVAL ABLTJ 3D MAPG TX SVT N/A 06/04/2019    Procedure: Accessory Pathway Ablation;  Surgeon: Joretta Bachelor, MD;  Location: [facility] EP;  Service: Cardiology   ??? PR ELECTROPHYS EV,R A-V PACE/REC,W/O INDUCT N/A 06/04/2019    Procedure: Comprehensive Study W IND;  Surgeon: Joretta Bachelor, MD;  Location: [facility] EP;  Service: Cardiology   ??? PR EXC SWEAT GLAND LESN AXILL,SIMPL Right 10/30/2015    Procedure: RIGHT AXILLAE EXCISE HIDRADENITIS;  Surgeon: Oren Section, MD;  Location: [facility];  Service: Plastics   ??? PR NEGATIVE PRESSURE WOUND THERAPY DME </= 50 SQ CM N/A 10/30/2015    Procedure: WOUND VAC PLACEMENT;  Surgeon: Oren Section, MD;  Location: [facility];  Service: Plastics   ??? PR SPLIT GRFT TRUNK,ARM,LEG <100 SQCM N/A 10/30/2015    Procedure: SPLIT-THICKNESS SKIN GRAFT;  Surgeon: Oren Section, MD;  Location: [facility];  Service: Plastics       Social History:  Social History     Tobacco Use   ??? Smoking status: Current Some Day Smoker     Types: Cigars   ??? Smokeless tobacco: Never Used   ??? Tobacco comment: Black milds   Vaping Use   ??? Vaping Use: Never used   Substance Use Topics   ??? Alcohol use: Yes     Alcohol/week: 1.0 standard drink  Types: 1 Shots of liquor per week     Comment: occasional   ??? Drug use: Yes     Frequency: 2.0 times per week     Types: Marijuana       Family History:  Family History   Problem Relation Age of Onset   ??? Diabetes Mother    ??? Diabetes Father    ??? Heart disease Maternal Grandmother    ??? Diabetes Maternal Grandmother    ??? Heart disease Paternal Grandmother    ??? Melanoma Neg Hx    ??? Basal cell carcinoma Neg Hx    ??? Squamous cell carcinoma Neg Hx        Previous Abdominal Surgeries:    Colon Cancer Screening Status:              Objective   Vital Signs:  Vitals:    11/17/20 0903   BP: 124/75   Pulse: 99   Weight: 82.6 kg (182 lb)   Height: 166.4 cm (5' 5.5)      There is no height or weight on file to calculate BMI.    Physical Exam:   Gen: WDWN patient in NAD, answers questions appropriately, sitting on exam bench   Eyes: Sclera anicteric, EOMI, PERRL  HENT: atraumatic, normocephalic, MMM. OP w/o erythema or exudate   Neck: no cervical lymphadenopathy or thyromegaly  Heart: RRR, S1, S2, no M/R/G   Lungs: CTAB, no crackles or wheezes, no use of accessory muscles  Abdomen: Normoactive bowel sounds, soft, NTND, no rebound/guarding  Extremities: no edema  Neuro: CN II-XI grossly intact,  Braces in place on bilateral lower extremities    Skin:  No rashes, lesions on clothed exam  Psych: Alert and oriented, normal mood and affect.     Recent Labs:  Lab Results   Component Value Date    WBC 6.7 08/24/2020    RBC 4.17 08/24/2020    HGB 12.7 08/24/2020    HCT 36.9 08/24/2020    MCV 88.6 08/24/2020    MCH 30.4 08/24/2020    MCHC 34.3 08/24/2020    RDW 13.4 08/24/2020    PLT 274 08/24/2020    MPV 10.3 08/24/2020       Chemistry        Component Value Date/Time    NA 133 (L) 08/24/2020 0740    NA 138 12/17/2013 1326    K 4.4 08/24/2020 0740    K 4.2 12/17/2013 1326    CL 101 08/24/2020 0740    CL 101 12/17/2013 1326    CO2 27.8 08/24/2020 0740    CO2 27 12/17/2013 1326    BUN 15 08/24/2020 0740    BUN 16 12/17/2013 1326    CREATININE 0.59 (L) 08/24/2020 0740    CREATININE 0.50 (L) 12/17/2013 1326    GLU 531 (HH) 08/24/2020 0740        Component Value Date/Time    CALCIUM 9.2 08/24/2020 0740    CALCIUM 8.9 12/17/2013 1326    ALKPHOS 82 08/24/2020 0740    AST <8 08/24/2020 0740    AST 12 (L) 03/24/2013 1103    ALT 11 08/24/2020 0740    ALT 14 (L) 03/24/2013 1103    BILITOT 0.3 08/24/2020 0740        Lab Results   Component Value Date    TSH 1.160 08/24/2020     No results found for: IRON, TIBC, FERRITIN        Previous Endoscopy Procedures:  EGD and  colonoscopy 2015 at [facility], which she was told appeared normal    Recent GI Imaging Studies:  CT abdomen and pelvis with contrast 03/29/20  --No CT evidence of acute findings in the abdomen and pelvis.  --Slightly heterogeneous appearance of the cervix which is not specific.  This finding could be secondary to presence of cervicitis, nabothian cysts or could also be associated with menstrual cycle. A cervical malignancy is not favored although not completely excluded. Clinical correlation is recommended      06/21/02 ?? ?? UPPER GI   The patient is a [age]-year-old female referred for exam because   of chronic GERD.     PROCEDURE/FINDINGS: The patient was given barium PO and images of   the upper GI tract were obtained using intravenous fluoroscopy.     FINDINGS: The esophagus is normal in caliber and contour. There   is no evidence of abnormal narrowing or mass lesion. The GE   junction is normal. The stomach is normal. The duodenojejunal   junction is located left of midline at the level of the pylorus.   There is no evidence of reflux.     IMPRESSION: Normal upper GI.      06/13/2017 Abdominal ultrasound  IMPRESSION:   1. Liver echogenicity overall is increased, a finding felt to be   indicative of a degree of hepatic steatosis. While no focal liver   lesions are evident on this study, it must be cautioned that the   sensitivity of ultrasound for detection of focal liver lesions is   diminished in this circumstance.     2. ??Gallbladder absent.     3. ??Study otherwise unremarkable.     11/23/2013 NM gastric emptying   COMPARISON: ??Abdominal CT 04/04/2013.     FINDINGS:   Expected location of the stomach in the left upper quadrant.   Ingested meal empties the stomach gradually over the course of the   study.     0% emptied at 1 hr ( normal >= 10%)     39%??emptied at 2 hr ( normal >= 40%)     75% emptied at 3 hr ( normal >= 70%)     92% emptied at 4 hr ( normal >= 90%)     IMPRESSION:   Borderline abnormal gastric emptying study with delayed early   gastric emptying. After 2 hr, the emptying is within normal limits.

## 2020-11-20 ENCOUNTER — Ambulatory Visit: Admit: 2020-11-20 | Discharge: 2020-11-21 | Payer: PRIVATE HEALTH INSURANCE

## 2020-11-21 NOTE — Unmapped (Signed)
Procedure: Resection of hidradenitis tissue.     HPI: Pamela Gutierrez is a 30 y.o. female with a history of HS who is here to discuss resection of hidradenitis with Dr. Nancie Neas on 12/15/2020.      For further detail, please see clinic note from 05/15/2020.    Surgery Scheduling Checklist  ??  Surgery (to be used for verify informed consent): Incision and drainage, possible debridement, possible excision, possible unroofing, possible partial closure of hidradenitis lesions on left and right vulva  Date of surgery: Next available  Case request ordered: Yes  ??  Pre-op: Yes. Message sent to [facility]. Once we have surgery date, she will notify Ashli who can schedule a pre-op visit.  Pre-care (request for pre anesthesia testing): No  Blood thinners: ASA 81mg , Prasugrel 10mg . Do not stop ASA. Need cardiac clearance to stop Effient.   Labwork: None  Admission status: Outpatient  Post-op appt: TBD  Other pre-op needs: Cardiac clearance  Case length: 180 with turnover  Priority level: Semi-elective    She endorses the following: Prior MI, Prior coronary revascularization - with stent and heart murmur.   She takes ASA 81 mg. Her Effient was recently stopped by Dr. Julio Alm.   She has a cardiologist. Dr. Joneen Roach, located in [facility], phone number [phone].     She reports recent antibiotics for HS (Augmentin).  She denies hospitalization since last clinic visit.    She denies the following: Prior stroke, Prior DVT, Prior PE.    She states that she can walk up two flights of stairs without chest pain or shortness of breath.  She denies previous problems with anesthesia regarding difficult intubation or prolonged time to wake up.   She denies blood transfusion in the past 90 days.     Today, she reports that she is doing well.  Reports mild right sided abdominal pain.     Today, she denies fever, chills, chest pain, shortness of breath, cough, wheezing, nausea, vomiting, abdominal pain, flank pain, diarrhea, constipation, dysuria, hematuria, rashes, open wounds, edema, and weakness or numbness in the upper or lower extremities.    Her frail score today was 3.    PAST MEDICAL HISTORY:   Past Medical History:   Diagnosis Date   ??? Allergic    ??? Anemia    ??? Asthma    ??? Eczema    ??? Gastroparesis    ??? GERD (gastroesophageal reflux disease)    ??? Hyperlipidemia    ??? Hypertension    ??? Impaired mobility    ??? Murmur, cardiac    ??? Obesity    ??? 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 Type 2 diabetes mellitus (CMS-HCC)     BS- 90-130       PAST SURGICAL HISTORY:   Past Surgical History:   Procedure Laterality Date   ??? ABLATION OF DYSRHYTHMIC FOCUS     ??? CARDIAC CATHETERIZATION     ??? CHOLECYSTECTOMY  2015   ??? CORONARY STENT PLACEMENT     ??? ELBOW FRACTURE SURGERY     ??? PR CATH PLACE/CORON ANGIO, IMG SUPER/INTERP,W LEFT HEART VENTRICULOGRAPHY N/A 11/12/2019    Procedure: Left Heart Catheterization;  Surgeon: Alvira Philips, MD;  Location: [facility] CATH;  Service: Cardiology   ??? PR COMPRE EP EVAL ABLTJ 3D MAPG TX SVT N/A 06/04/2019    Procedure: Accessory Pathway Ablation;  Surgeon: Joretta Bachelor, MD;  Location: [facility] EP;  Service: Cardiology   ???  PR ELECTROPHYS EV,R A-V PACE/REC,W/O INDUCT N/A 06/04/2019    Procedure: Comprehensive Study W IND;  Surgeon: Joretta Bachelor, MD;  Location: [facility] EP;  Service: Cardiology   ??? PR EXC SWEAT GLAND LESN AXILL,SIMPL Right 10/30/2015    Procedure: RIGHT AXILLAE EXCISE HIDRADENITIS;  Surgeon: Oren Section, MD;  Location: [facility] OR HPR;  Service: Plastics   ??? PR NEGATIVE PRESSURE WOUND THERAPY DME </= 50 SQ CM N/A 10/30/2015    Procedure: WOUND VAC PLACEMENT;  Surgeon: Oren Section, MD;  Location: [facility] OR [facility];  Service: Plastics   ??? PR SPLIT GRFT TRUNK,ARM,LEG <100 SQCM N/A 10/30/2015    Procedure: SPLIT-THICKNESS SKIN GRAFT;  Surgeon: Oren Section, MD;  Location: [facility];  Service: Plastics       FAMILY HISTORY: He denies a family history of coagulopathy or problems with anesthesia.     SOCIAL HISTORY:   Social History     Tobacco Use   ??? Smoking status: Some Days     Types: Cigars   ??? Smokeless tobacco: Never   ??? Tobacco comments:     Black milds   Substance Use Topics   ??? Alcohol use: Yes     Alcohol/week: 1.0 standard drink     Types: 1 Shots of liquor per week     Comment: occasional       MEDICATIONS:     Current Outpatient Medications:   ???  acetaminophen (TYLENOL 8 HOUR) 650 MG CR tablet, Take 650 mg by mouth nightly., Disp: , Rfl:   ???  acyclovir (ZOVIRAX) 400 MG tablet, TAKE 1 TABLET(400 MG) BY MOUTH THREE TIMES DAILY (Patient taking differently: PRN), Disp: 90 tablet, Rfl: 0  ???  albuterol HFA 90 mcg/actuation inhaler, Inhale 2 puffs every four (4) hours as needed. , Disp: , Rfl:   ???  amLODIPine (NORVASC) 10 MG tablet, Take 1 tablet (10 mg total) by mouth daily., Disp: 90 tablet, Rfl: 3  ???  aspirin 81 MG chewable tablet, Chew 1 tablet (81 mg total) daily. (Patient taking differently: Chew 81 mg nightly.), Disp: 30 tablet, Rfl: 11  ???  atorvastatin (LIPITOR) 80 MG tablet, Take 1 tablet (80 mg total) by mouth daily. (Patient taking differently: Take 80 mg by mouth daily. Patient stop for 4 weeks), Disp: 90 tablet, Rfl: 1  ???  BELSOMRA 10 mg tablet, TAKE 1 TABLET(10 MG) BY MOUTH EVERY NIGHT, Disp: 90 tablet, Rfl: 1  ???  blood sugar diagnostic (GLUCOSE BLOOD) Strp, Test once daily and as directed.one touch verio flex testing strips, Disp: 100 strip, Rfl: 3  ???  cetirizine 10 mg cap, Take 10 mg by mouth daily as needed. , Disp: , Rfl:   ???  clindamycin (CLEOCIN T) 1 % lotion, Once daily to body areas that are typically affected, Disp: 60 mL, Rfl: 11  ???  clobetasoL (TEMOVATE) 0.05 % ointment, Apply topically Two (2) times a day. As needed for flares up to a week at a time, Disp: 60 g, Rfl: 3  ???  cyclobenzaprine (FLEXERIL) 10 MG tablet, TAKE 1 TABLET(10 MG) BY MOUTH EVERY NIGHT, Disp: 90 tablet, Rfl: 1  ???  empty container (SHARPS-A-GATOR DISPOSAL SYSTEM) Misc, Use as directed for sharps disposal, Disp: 1 each, Rfl: 2  ???  evolocumab (REPATHA SYRINGE) 140 mg/mL Syrg, Inject the contents of one pen (140 mg) under the skin every fourteen (14) days., Disp: 6 mL, Rfl: 3  ???  ferrous sulfate 325 (65 FE) MG tablet, Take  by mouth. Once daily PRN, Disp: , Rfl:   ???  flash glucose scanning reader (FLASH GLUCOSE SCANNING READER), by Other route Take as directed. Dispense Libre 2 scanning reader, use as directed, Disp: 1 each, Rfl: 2  ???  flash glucose scanning reader (FLASH GLUCOSE SCANNING READER), by Other route Take as directed. Dispense Libre 2 scanning reader, use as directed, Disp: 3 each, Rfl: 5  ???  flash glucose sensor (FLASH GLUCOSE SENSOR) kit, Dispense Libre 2 sensors, either 1 mo supply or 3 mo supply, as per patient preference; change q 14 days, Disp: 6 each, Rfl: 3  ???  furosemide (LASIX) 40 MG tablet, TAKE 1 TABLET BY MOUTH DAILY AS NEEDED FOR> 2 POUND WEIGHT GAIN IN A 24 HOUR PERIOD (Patient taking differently: 40 mg nightly.), Disp: 90 tablet, Rfl: 1  ???  gabapentin (NEURONTIN) 300 MG capsule, Take 300 mg (1 capsule) in the morning, 300 mg at noon, and 600 mg (2 capsules) in the evening., Disp: 360 capsule, Rfl: 1  ???  hydrOXYzine (ATARAX) 25 MG tablet, TAKE 1 TABLET(25 MG) BY MOUTH EVERY 8 HOURS AS NEEDED, Disp: 90 tablet, Rfl: 1  ???  insulin ASPART (NOVOLOG FLEXPEN) 100 unit/mL (3 mL) injection pen, Inject 0.06 mL (6 Units total) under the skin Three (3) times a day before meals. Take 5 units three times per day with meals (2 units with snacks/small meals), plus 2 extra for every 50 points over 150. Up to 50 units per day., Disp: 15 mL, Rfl: 12  ???  insulin degludec (TRESIBA FLEXTOUCH U-100) 100 unit/mL (3 mL) InPn, Inject 50 units every night. Increase or decrease dose accordingly as instructed by MD. Maximum of 64 units every night. (Patient taking differently: Inject 56 units every night. Increase or decrease dose accordingly as instructed by MD. Maximum of 64 units every night.), Disp: 21 mL, Rfl: 11  ???  insulin needles, disposable, (BD ULTRA-FINE NANO PEN NEEDLES) 32 x 5/32  Ndle, Inject 1 each under the skin Five (5) times a day. Use 5x/day with Lantus, Novolog, and Victoza pens., Disp: 200 each, Rfl: 12  ???  insulin syringe-needle U-100 (BD INSULIN SYRINGE ULT-FINE II) 0.5 mL 31 gauge x 5/16 Syrg, ok to sub any brand or size insulin syringe preferred by insurance/patient, use 5x/day, dx E11.65, Disp: 200 each, Rfl: 5  ???  insulin syringe-needle U-100 0.3 mL 31 x 3/8 Syrg, For use with Humulin R AC TID., Disp: 300 Syringe, Rfl: 3  ???  losartan (COZAAR) 25 MG tablet, TAKE 1 TABLET(25 MG) BY MOUTH DAILY, Disp: 90 tablet, Rfl: 3  ???  metFORMIN (GLUCOPHAGE-XR) 500 MG 24 hr tablet, Take 2 tablets (1,000 mg total) by mouth two (2) times a day. With meals., Disp: 360 tablet, Rfl: 3  ???  metoclopramide (REGLAN) 10 MG tablet, Take 1 tablet (10 mg total) by mouth four (4) times a day as needed. Take 1 tablet 4 times daily as neeed, Disp: 120 tablet, Rfl: 3  ???  metoprolol succinate (TOPROL-XL) 100 MG 24 hr tablet, TAKE 1 TABLET(100 MG) BY MOUTH EVERY MORNING, Disp: 90 tablet, Rfl: 3  ???  montelukast (SINGULAIR) 10 mg tablet, Take 10 mg by mouth daily as needed. , Disp: , Rfl:   ???  NORLYDA 0.35 mg tablet, TAKE 1 TABLET(0.35 MG) BY MOUTH DAILY, Disp: 84 tablet, Rfl: 3  ???  omeprazole (PRILOSEC) 20 MG capsule, Take 20 mg by mouth daily., Disp: , Rfl:   ???  ondansetron (ZOFRAN-ODT) 4 MG  disintegrating tablet, Take 1 tablet (4 mg total) by mouth every eight (8) hours as needed for nausea., Disp: 60 tablet, Rfl: 3  ???  promethazine (PHENERGAN) 25 MG tablet, TAKE 1 TABLET(25 MG) BY MOUTH EVERY 6 HOURS AS NEEDED FOR NAUSEA, Disp: 60 tablet, Rfl: 3  ???  sertraline (ZOLOFT) 50 MG tablet, Take 1.5 tablets (75 mg total) by mouth daily., Disp: 135 tablet, Rfl: 3  ???  simethicone 125 mg cap, Take 250 mg by mouth nightly., Disp: , Rfl:   ???  spironolactone (ALDACTONE) 100 MG tablet, TAKE 1 TABLET(100 MG) BY MOUTH DAILY, Disp: 90 tablet, Rfl: 1  ???  triamcinolone (KENALOG) 0.1 % cream, Apply topically once as needed. , Disp: , Rfl:   ???  blood-glucose meter kit, Currently has One Touch Ultra test strips. Please issue per her formulary., Disp: 1 each, Rfl: 1    ALLERGIES:    Allergies   Allergen Reactions   ??? Lanolin Hives   ??? Morphine (Pf) Swelling   ??? Tomato Other (See Comments) and Swelling   ??? Venom-Honey Bee Swelling   ??? Wool Hives and Swelling   ??? Ibuprofen Other (See Comments)   ??? Nsaids (Non-Steroidal Anti-Inflammatory Drug) Other (See Comments)   ??? Iodinated Contrast Media Nausea And Vomiting and Rash   ??? Ioxaglate Sodium Nausea And Vomiting       REVIEW OF SYSTEMS: As per HPI.    PHYSICAL EXAM:  PE:   GENERAL: well-appearing female in NAD, A&O x3  VS:   BP 164/97  - Pulse 87  - Temp 36.6 ??C (97.8 ??F) (Temporal)  - Resp 18  - Ht 165.1 cm (5' 5)  - Wt 81.1 kg (178 lb 12.8 oz)  - LMP 11/03/2020  - BMI 29.75 kg/m??   HEENT: normocephalic, atraumatic, EOM intact  NECK: supple, no lymphadenopathy  CHEST: CTAB, no wheezes, ronchi, rales  CARDIAC: RRR,   ABDOMEN: soft, non-tender, non-distend,    RECTAL/GU: deferred  EXTREMITIES: warm, dry, well-perfused, no edema   NEUROLOGICAL: CNII-XII grossly intact; pt is interactive and answering questions appropriately.     ASSESSMENT/PLAN:  HS pending excision of hidradenitis tissue in groin on 12/15/20 by Dr. Guy Sandifer.     - Covid test (will order)  - PreOp Evaluation today   - Verbal Informed consent was obtained today in clinic.   - Urine Culture was not sent.   - Patient was notified that he would receive phone calls about 24h prior to surgery confirming time and location   - Patient was verbally instructed to continue 81 mg Aspirin prior to surgery if taking; patient was agreeable.  - PreCare Anesthesia assessment not indicated  .   - Cardiac clearance received from Joneen Roach, MD on 05/23/2020.     Pamela Gutierrez was consented by Spero Geralds.  The risks and benefits were discussed with the patient, including risk of infection, blood loss, device malfunction, general anesthesia risks including heart attack, stroke and death. The post op course was discussed with the patient as well which includes: potential 1 night in the hospital for IV abx, foley removal,  as well as discharge with 14 day supply of augmentin.  He understands his device may be inflated 50-60% for his post-op course.  The signs of infection were discussed including pus like drainage or fevers.  He understands he will be bruised and swollen after surgery for 2-4 weeks and sometimes longer.  He was instructed to not lift anything over 10  pounds for 6 weeks.  He understands to remain NPO midnight prior to his surgical date and to discontinue intake of NSAIDs for 7 days prior to his surgical date. At this time there are no contraindications to the above procedure.      Sadie Haber, MPAP, PA-C  Reconstructive Urology at [facility], [place]   11/24/20  Phone: [phone]  Pager: [phone]

## 2020-11-23 ENCOUNTER — Ambulatory Visit: Admit: 2020-11-23 | Discharge: 2020-11-24 | Payer: PRIVATE HEALTH INSURANCE | Attending: Medical | Primary: Medical

## 2020-11-27 ENCOUNTER — Emergency Department
Admit: 2020-11-27 | Discharge: 2020-11-28 | Disposition: A | Discharge status: Home / Self Care | Payer: PRIVATE HEALTH INSURANCE | Attending: Emergency Medicine

## 2020-11-27 ENCOUNTER — Ambulatory Visit
Admit: 2020-11-27 | Discharge: 2020-11-28 | Disposition: A | Discharge status: Home / Self Care | Payer: PRIVATE HEALTH INSURANCE | Attending: Emergency Medicine

## 2020-11-27 DIAGNOSIS — J189 Pneumonia, unspecified organism: Principal | ICD-10-CM

## 2020-11-27 DIAGNOSIS — J101 Influenza due to other identified influenza virus with other respiratory manifestations: Principal | ICD-10-CM

## 2020-11-27 LAB — URINALYSIS WITH CULTURE REFLEX
BILIRUBIN UA: NEGATIVE
BLOOD UA: NEGATIVE
GLUCOSE UA: >1000 — AB
KETONES UA: 10 — AB
LEUKOCYTE ESTERASE UA: NEGATIVE
NITRITE UA: NEGATIVE
PH UA: 6.5 (ref 5.0–9.0)
PROTEIN UA: NEGATIVE
RBC UA: <1 /HPF (ref <=4)
SPECIFIC GRAVITY UA: >1.03 (ref 1.005–1.040)
SQUAMOUS EPITHELIAL: 3 /HPF (ref 0–5)
UROBILINOGEN UA: <2
WBC UA: 1 /HPF (ref 0–5)

## 2020-11-27 LAB — CBC W/ AUTO DIFF
BASOPHILS ABSOLUTE COUNT: 0 10*9/L (ref 0.0–0.1)
BASOPHILS RELATIVE PERCENT: 0.6 %
EOSINOPHILS ABSOLUTE COUNT: 0.2 10*9/L (ref 0.0–0.5)
EOSINOPHILS RELATIVE PERCENT: 4.2 %
HEMATOCRIT: 43.3 % (ref 34.0–44.0)
HEMOGLOBIN: 14.4 g/dL (ref 11.3–14.9)
LYMPHOCYTES ABSOLUTE COUNT: 1.6 10*9/L (ref 1.1–3.6)
LYMPHOCYTES RELATIVE PERCENT: 34.6 %
MEAN CORPUSCULAR HEMOGLOBIN CONC: 33.3 g/dL (ref 32.0–36.0)
MEAN CORPUSCULAR HEMOGLOBIN: 29.6 pg (ref 25.9–32.4)
MEAN CORPUSCULAR VOLUME: 88.8 fL (ref 77.6–95.7)
MEAN PLATELET VOLUME: 9.4 fL (ref 6.8–10.7)
MONOCYTES ABSOLUTE COUNT: 0.3 10*9/L (ref 0.3–0.8)
MONOCYTES RELATIVE PERCENT: 5.7 %
NEUTROPHILS ABSOLUTE COUNT: 2.5 10*9/L (ref 1.8–7.8)
NEUTROPHILS RELATIVE PERCENT: 54.9 %
NUCLEATED RED BLOOD CELLS: 0 /100{WBCs} (ref <=4)
PLATELET COUNT: 272 10*9/L (ref 150–450)
RED BLOOD CELL COUNT: 4.88 10*12/L (ref 3.95–5.13)
RED CELL DISTRIBUTION WIDTH: 13.2 % (ref 12.2–15.2)
WBC ADJUSTED: 4.6 10*9/L (ref 3.6–11.2)

## 2020-11-27 LAB — COMPREHENSIVE METABOLIC PANEL
ALBUMIN: 3.6 g/dL (ref 3.4–5.0)
ALKALINE PHOSPHATASE: 80 U/L (ref 46–116)
ALT (SGPT): 9 U/L — ABNORMAL LOW (ref 10–49)
ANION GAP: 7 mmol/L (ref 5–14)
AST (SGOT): 10 U/L (ref <=34)
BILIRUBIN TOTAL: 0.2 mg/dL — ABNORMAL LOW (ref 0.3–1.2)
BLOOD UREA NITROGEN: 8 mg/dL — ABNORMAL LOW (ref 9–23)
BUN / CREAT RATIO: 21
CALCIUM: 9.3 mg/dL (ref 8.7–10.4)
CHLORIDE: 105 mmol/L (ref 98–107)
CO2: 27 mmol/L (ref 20.0–31.0)
CREATININE: 0.39 mg/dL — ABNORMAL LOW
EGFR CKD-EPI (2021) FEMALE: >90 mL/min/{1.73_m2} (ref >=60)
GLUCOSE RANDOM: 304 mg/dL — ABNORMAL HIGH (ref 70–179)
POTASSIUM: 3.8 mmol/L (ref 3.4–4.8)
PROTEIN TOTAL: 7.2 g/dL (ref 5.7–8.2)
SODIUM: 139 mmol/L (ref 135–145)

## 2020-11-27 LAB — BETA HYDROXYBUTYRATE: BETA-HYDROXYBUTYRATE: 0.46 mmol/L — ABNORMAL HIGH (ref 0.02–0.27)

## 2020-11-27 MED ORDER — OSELTAMIVIR 75 MG CAPSULE
ORAL_CAPSULE | Freq: Two times a day (BID) | ORAL | 0 refills | 5.00000 days | Status: CP
Start: 2020-11-27 — End: 2020-12-02

## 2020-11-27 MED ORDER — AZITHROMYCIN 250 MG TABLET
ORAL_TABLET | ORAL | 0 refills | 5 days | Status: CP
Start: 2020-11-27 — End: 2020-12-03

## 2020-11-27 NOTE — Unmapped (Signed)
Patient's mother called to report patient not feeling well for a week, headaches and congestion. Blood sugar is elevated. Has decided to take her to ER for evaluation.

## 2020-11-27 NOTE — Unmapped (Signed)
Pt reports flu like symptoms for a week along with decrease appetite, n/v and high blood sugar. She was advised by her PCP to come ED for further evaluation

## 2020-11-28 DIAGNOSIS — G4700 Insomnia, unspecified: Principal | ICD-10-CM

## 2020-11-28 MED ORDER — BELSOMRA 10 MG TABLET
ORAL_TABLET | 0 refills | 0 days | Status: CP
Start: 2020-11-28 — End: ?

## 2020-11-28 MED ORDER — HYDROXYZINE HCL 25 MG TABLET
ORAL_TABLET | 0 refills | 0 days | Status: CP
Start: 2020-11-28 — End: 2020-11-28

## 2020-11-28 MED ADMIN — ipratropium (ATROVENT) 0.02 % nebulizer solution 500 mcg: 500 ug | RESPIRATORY_TRACT | @ 02:00:00 | Stop: 2020-11-27

## 2020-11-28 MED ADMIN — sodium chloride 0.9% (NS) bolus 1,000 mL: 1000 mL | INTRAVENOUS | @ 02:00:00 | Stop: 2020-11-27

## 2020-11-28 MED ADMIN — albuterol 2.5 mg /3 mL (0.083 %) nebulizer solution 5 mg: 5 mg | RESPIRATORY_TRACT | @ 02:00:00 | Stop: 2020-11-27

## 2020-11-28 MED ADMIN — oseltamivir (TAMIFLU) capsule 75 mg: 75 mg | ORAL | @ 04:00:00 | Stop: 2020-11-27

## 2020-11-28 MED ADMIN — azithromycin (ZITHROMAX) tablet 500 mg: 500 mg | ORAL | @ 04:00:00 | Stop: 2020-11-27

## 2020-11-28 NOTE — Unmapped (Signed)
 [facility]  Emergency Department Provider Note       ED Clinical Impression      Final diagnoses:   Influenza A (Primary)   Atypical pneumonia        Impression, ED Course, Assessment and Plan      Impression: Pamela Gutierrez is a 30 y.o. 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      On exam, patient is well appearing and in NAD. VS are stable and within normal limits. POC BG elevated to 371 at triage. Patient is afebrile, non-tachycardic, non-hypertensive, and satting at 100% on room air. Diffuse wheeze throughout. Mild TTP of RUQ. Soft abdomen.     Differential includes DKA vs influenza  vs uti vs pna.     Plan for basic labs, flu/RSV/COVID PCR, beta hydroxybutyrate, UA, urine culture, and CXR. Will give 1L NS bolus, as well as albuterol and atrovent nebulizer breathing treatments.           8:06 PM  CBC unremarkable. UA notable for >1000 mg/dL glucose, 10 mg/dL ketones, and rare bacterial presence. CMP shows depressed creatinine to 0.39 and elevated BG to 304 (improved from triage levels).     10:10 PM  Influenza A positive. Beta hydroxybutyrate levels elevated to 0.46. CXR clear.     10:20 PM  Reassessed patient and she is currently feeling better and tolerating PO. Given that her symptoms are ongoing for two weeks and not improving, they could be representative of atypical PNA. After a risk/benefits discussion, patient has agreed to be discharged with a prescription for tamiflu and Z-pak, along with using her albuerol inhaler as needed if her symptoms worsen. Will avoid steroids given elevated blood sugars. Pt voices understanding and will dc home with return precautions given.      Additional Medical Decision Making     I have reviewed the vital signs and the nursing notes. Labs and radiology results that were available during my care of the patient were independently reviewed by me and considered in my medical decision making.     I independently visualized the radiology images.   I reviewed the patient's prior medical records (ED note, 08/24/20).     Portions of this record have been created using Scientist, clinical (histocompatibility and immunogenetics). Dictation errors have been sought, but may not have been identified and corrected.  ____________________________________________     History        Chief Complaint  Medical Problem      HPI   Pamela Gutierrez is a 30 y.o. 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 Patient reports around 3-4 days of reduced appetite, along with around 1 week of flu-like symptoms, including productive coughs with varying amounts of sputum production, shortness of breath, abdominal pain, nausea, NBNB emesis, and constipation. She notes that she is able to tolerate fluid PO and that despite  her reduced appetite, her BG has been elevated today to the 300s, most recently measuring at 370 in triage. She also endorses some subjective fevers last week, but denies fevers within the last few days. Patient has been using her home albuterol inhaler for relief of her shortness of breath. Patient endorses multiple sick contacts in her household, but notes that her recovery time has been longer than others in her family. She has been in DKA once before, but reports that her symptoms today feel different than her prior DKA experience. Daily medications include metformin and novolog. Denies dysuria, hematuria, diarrhea, or chest pain.       Past Medical History:   Diagnosis Date   ??? Allergic    ??? Anemia    ??? Asthma    ??? Eczema    ??? Gastroparesis    ??? GERD (gastroesophageal reflux disease)    ??? Hyperlipidemia    ??? Hypertension    ??? Impaired mobility    ??? Murmur, cardiac    ??? Obesity    ??? 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 Type 2 diabetes mellitus (CMS-HCC)     BS- 90-130       Patient Active Problem List   Diagnosis   ??? Type 2 diabetes mellitus with hyperglycemia, with long-term current use of insulin (CMS-HCC)   ??? GERD (gastroesophageal reflux disease)   ??? Mild intermittent asthma without complication   ??? Seasonal allergies   ??? Anemia   ??? Hypertension   ??? Gastroparesis due to DM (CMS-HCC)   ??? Syncope   ??? WPW (Wolff-Parkinson-White syndrome)   ??? ST elevation myocardial infarction involving right coronary artery (CMS-HCC)   ??? Chest pain   ??? Pure hypercholesterolemia   ??? Left foot drop   ??? Hidradenitis suppurativa   ??? Insomnia   ??? Polyneuropathy associated with underlying disease (CMS-HCC)   ??? Moderate episode of recurrent major depressive disorder (CMS-HCC)   ??? Cervical high risk HPV (human papillomavirus) test positive       Past Surgical History:   Procedure Laterality Date   ??? ABLATION OF DYSRHYTHMIC FOCUS     ??? CARDIAC CATHETERIZATION     ??? CHOLECYSTECTOMY  2015   ??? CORONARY STENT PLACEMENT     ??? ELBOW FRACTURE SURGERY     ??? PR CATH PLACE/CORON ANGIO, IMG SUPER/INTERP,W LEFT HEART VENTRICULOGRAPHY N/A 11/12/2019    Procedure: Left Heart Catheterization;  Surgeon: Alvira Philips, MD;  Location: [facility] CATH;  Service: Cardiology   ??? PR COMPRE EP EVAL ABLTJ 3D MAPG TX SVT N/A 06/04/2019    Procedure: Accessory Pathway Ablation;  Surgeon: Joretta Bachelor, MD;  Location: [facility] EP;  Service: Cardiology   ??? PR ELECTROPHYS EV,R A-V PACE/REC,W/O INDUCT N/A 06/04/2019    Procedure: Comprehensive Study W IND;  Surgeon: Joretta Bachelor, MD;  Location: [facility] EP;  Service: Cardiology   ??? PR EXC SWEAT GLAND LESN AXILL,SIMPL Right 10/30/2015    Procedure: RIGHT AXILLAE EXCISE HIDRADENITIS;  Surgeon: Oren Section, MD;  Location: [facility];  Service: Plastics   ??? PR NEGATIVE PRESSURE WOUND THERAPY DME </= 50 SQ CM N/A 10/30/2015    Procedure: WOUND VAC PLACEMENT;  Surgeon: Oren Section, MD;  Location: [facility];  Service: Plastics   ??? PR SPLIT GRFT TRUNK,ARM,LEG <100 SQCM N/A 10/30/2015    Procedure: SPLIT-THICKNESS SKIN GRAFT;  Surgeon: Oren Section, MD;  Location: [facility];  Service:  Plastics       No current facility-administered medications for this encounter.    Current Outpatient Medications:   ???  acetaminophen (TYLENOL 8 HOUR) 650 MG CR tablet, Take 650 mg by mouth nightly., Disp: , Rfl:   ???  acyclovir (ZOVIRAX) 400 MG tablet, TAKE 1 TABLET(400 MG) BY MOUTH THREE TIMES DAILY (Patient taking differently: PRN), Disp: 90 tablet, Rfl: 0  ???  albuterol HFA 90 mcg/actuation inhaler, Inhale 2 puffs every four (4) hours as needed. , Disp: , Rfl:   ???  amLODIPine (NORVASC) 10 MG tablet, Take 1 tablet (10 mg total) by mouth daily., Disp: 90 tablet, Rfl: 3  ???  aspirin 81 MG chewable tablet, Chew 1 tablet (81 mg total) daily. (Patient taking differently: Chew 81 mg nightly.), Disp: 30 tablet, Rfl: 11  ???  atorvastatin (LIPITOR) 80 MG tablet, Take 1 tablet (80 mg total) by mouth daily. (Patient taking differently: Take 80 mg by mouth daily. Patient stop for 4 weeks), Disp: 90 tablet, Rfl: 1  ???  BELSOMRA 10 mg tablet, TAKE 1 TABLET(10 MG) BY MOUTH EVERY NIGHT, Disp: 90 tablet, Rfl: 1  ???  blood sugar diagnostic (GLUCOSE BLOOD) Strp, Test once daily and as directed.one touch verio flex testing strips, Disp: 100 strip, Rfl: 3  ???  blood-glucose meter kit, Currently has One Touch Ultra test strips. Please issue per her formulary., Disp: 1 each, Rfl: 1  ???  cetirizine 10 mg cap, Take 10 mg by mouth daily as needed. , Disp: , Rfl:   ???  clindamycin (CLEOCIN T) 1 % lotion, Once daily to body areas that are typically affected, Disp: 60 mL, Rfl: 11  ???  clobetasoL (TEMOVATE) 0.05 % ointment, Apply topically Two (2) times a day. As needed for flares up to a week at a time, Disp: 60 g, Rfl: 3  ???  cyclobenzaprine (FLEXERIL) 10 MG tablet, TAKE 1 TABLET(10 MG) BY MOUTH EVERY NIGHT, Disp: 90 tablet, Rfl: 1  ???  empty container (SHARPS-A-GATOR DISPOSAL SYSTEM) Misc, Use as directed for sharps disposal, Disp: 1 each, Rfl: 2  ???  evolocumab (REPATHA SYRINGE) 140 mg/mL Syrg, Inject the contents of one pen (140 mg) under the skin every fourteen (14) days., Disp: 6 mL, Rfl: 3  ???  ferrous sulfate 325 (65 FE) MG tablet, Take by mouth. Once daily PRN, Disp: , Rfl:   ???  flash glucose scanning reader (FLASH GLUCOSE SCANNING READER), by Other route Take as directed. Dispense Libre 2 scanning reader, use as directed, Disp: 1 each, Rfl: 2  ???  flash glucose scanning reader (FLASH GLUCOSE SCANNING READER), by Other route Take as directed. Dispense Libre 2 scanning reader, use as directed, Disp: 3 each, Rfl: 5  ???  flash glucose sensor (FLASH GLUCOSE SENSOR) kit, Dispense Libre 2 sensors, either 1 mo supply or 3 mo supply, as per patient preference; change q 14 days, Disp: 6 each, Rfl: 3  ???  furosemide (LASIX) 40 MG tablet, TAKE 1 TABLET BY MOUTH DAILY AS NEEDED FOR> 2 POUND WEIGHT GAIN IN A 24 HOUR PERIOD (Patient taking differently: 40 mg nightly.), Disp: 90 tablet, Rfl: 1  ???  gabapentin (NEURONTIN) 300 MG capsule, Take 300 mg (1 capsule) in the morning, 300 mg at noon, and 600 mg (2 capsules) in the evening., Disp: 360 capsule, Rfl: 1  ???  hydrOXYzine (ATARAX) 25 MG tablet, TAKE 1 TABLET(25 MG) BY MOUTH EVERY 8 HOURS AS NEEDED, Disp: 90 tablet, Rfl: 1  ???  insulin  ASPART (NOVOLOG FLEXPEN) 100 unit/mL (3 mL) injection pen, Inject 0.06 mL (6 Units total) under the skin Three (3) times a day before meals. Take 5 units three times per day with meals (2 units with snacks/small meals), plus 2 extra for every 50 points over 150. Up to 50 units per day., Disp: 15 mL, Rfl: 12  ???  insulin degludec (TRESIBA FLEXTOUCH U-100) 100 unit/mL (3 mL) InPn, Inject 50 units every night. Increase or decrease dose accordingly as instructed by MD. Maximum of 64 units every night. (Patient taking differently: Inject 56 units every night. Increase or decrease dose accordingly as instructed by MD. Maximum of 64 units every night.), Disp: 21 mL, Rfl: 11  ???  insulin needles, disposable, (BD ULTRA-FINE NANO PEN NEEDLES) 32 x 5/32  Ndle, Inject 1 each under the skin Five (5) times a day. Use 5x/day with Lantus, Novolog, and Victoza pens., Disp: 200 each, Rfl: 12  ???  insulin syringe-needle U-100 (BD INSULIN SYRINGE ULT-FINE II) 0.5 mL 31 gauge x 5/16 Syrg, ok to sub any brand or size insulin syringe preferred by insurance/patient, use 5x/day, dx E11.65, Disp: 200 each, Rfl: 5  ???  insulin syringe-needle U-100 0.3 mL 31 x 3/8 Syrg, For use with Humulin R AC TID., Disp: 300 Syringe, Rfl: 3  ???  losartan (COZAAR) 25 MG tablet, TAKE 1 TABLET(25 MG) BY MOUTH DAILY, Disp: 90 tablet, Rfl: 3  ???  metFORMIN (GLUCOPHAGE-XR) 500 MG 24 hr tablet, Take 2 tablets (1,000 mg total) by mouth two (2) times a day. With meals., Disp: 360 tablet, Rfl: 3  ???  metoclopramide (REGLAN) 10 MG tablet, Take 1 tablet (10 mg total) by mouth four (4) times a day as needed. Take 1 tablet 4 times daily as neeed, Disp: 120 tablet, Rfl: 3  ???  metoprolol succinate (TOPROL-XL) 100 MG 24 hr tablet, TAKE 1 TABLET(100 MG) BY MOUTH EVERY MORNING, Disp: 90 tablet, Rfl: 3  ???  montelukast (SINGULAIR) 10 mg tablet, Take 10 mg by mouth daily as needed. , Disp: , Rfl:   ???  NORLYDA 0.35 mg tablet, TAKE 1 TABLET(0.35 MG) BY MOUTH DAILY, Disp: 84 tablet, Rfl: 3  ???  omeprazole (PRILOSEC) 20 MG capsule, Take 20 mg by mouth daily., Disp: , Rfl:   ??? ondansetron (ZOFRAN-ODT) 4 MG disintegrating tablet, Take 1 tablet (4 mg total) by mouth every eight (8) hours as needed for nausea., Disp: 60 tablet, Rfl: 3  ???  promethazine (PHENERGAN) 25 MG tablet, TAKE 1 TABLET(25 MG) BY MOUTH EVERY 6 HOURS AS NEEDED FOR NAUSEA, Disp: 60 tablet, Rfl: 3  ???  sertraline (ZOLOFT) 50 MG tablet, Take 1.5 tablets (75 mg total) by mouth daily., Disp: 135 tablet, Rfl: 3  ???  simethicone 125 mg cap, Take 250 mg by mouth nightly., Disp: , Rfl:   ???  spironolactone (ALDACTONE) 100 MG tablet, TAKE 1 TABLET(100 MG) BY MOUTH DAILY, Disp: 90 tablet, Rfl: 1  ???  triamcinolone (KENALOG) 0.1 % cream, Apply topically once as needed. , Disp: , Rfl:     Allergies  Lanolin, Morphine (pf), Tomato, Venom-honey bee, Wool, Ibuprofen, Nsaids (non-steroidal anti-inflammatory drug), Iodinated contrast media, and Ioxaglate sodium    Family History   Problem Relation Age of Onset   ??? Diabetes Mother    ??? Diabetes Father    ??? Heart disease Maternal Grandmother    ??? Diabetes Maternal Grandmother    ??? Heart disease Paternal Grandmother    ??? Melanoma Neg Hx    ???  Basal cell carcinoma Neg Hx    ??? Squamous cell carcinoma Neg Hx        Social History  Social History     Tobacco Use   ??? Smoking status: Some Days     Types: Cigars   ??? Smokeless tobacco: Never   ??? Tobacco comments:     Black milds   Vaping Use   ??? Vaping Use: Never used   Substance Use Topics   ??? Alcohol use: Yes     Alcohol/week: 1.0 standard drink     Types: 1 Shots of liquor per week     Comment: occasional   ??? Drug use: Yes     Frequency: 2.0 times per week     Types: Marijuana       Review of Systems     Constitutional: Positive for fever and reduced appetite.  Eyes: Negative for visual changes.  ENT: Negative for sore throat.  Cardiovascular: Negative for chest pain.  Respiratory: Positive for cough and shortness of breath.  Gastrointestinal: Positive for abdominal pain, nausea, vomiting, and constipation. Negative for diarrhea.  Genitourinary: Negative for dysuria.   Musculoskeletal: Negative for back pain.  Skin: Negative for rash.  Neurological: Negative for headaches, focal weakness or numbness.    All other systems have been reviewed and are negative except as otherwise documented.     Physical Exam     This provider entered the patient's room: Yes:    ??? If this provider did not enter the room, a comprehensive physical exam was not able to be performed due to increased infection risk to themselves, other providers, staff and other patients), as well as to conserve personal protective equipment (PPE) utilization during the COVID-19 pandemic.    ??? If this provider did enter the patient room, the following was PPE worn: N/A    ED Triage Vitals [11/27/20 1217]   Enc Vitals Group      BP 132/85      Heart Rate 86      SpO2 Pulse       Resp 16      Temp 37 ??C (98.6 ??F)      Temp src       SpO2 99 %     Constitutional: Alert and oriented. Appears ill but nontoxic,.   Eyes: Conjunctivae are normal.  ENT       Head: Normocephalic and atraumatic.       Nose: No congestion.       Mouth/Throat: Mucous membranes are moist.       Neck: No stridor.  Hematological/Lymphatic/Immunilogical: No cervical lymphadenopathy.  Cardiovascular: Normal rate, regular rhythm. Normal and symmetric distal pulses are present in all extremities.  Respiratory: Normal respiratory effort. Diffuse wheeze throughout.   Gastrointestinal: Soft. Mild TTP of RUQ. There is no CVA tenderness.  Musculoskeletal: Normal range of motion in all extremities.       Right lower leg: No tenderness or edema.       Left lower leg: No tenderness or edema.  Neurologic: Normal speech and language. No gross focal neurologic deficits are appreciated.  Skin: Skin is warm, dry and intact. No rash noted.  Psychiatric: Mood and affect are normal. Speech and behavior are normal.     EKG     N/A     Radiology     XR Chest 2 views   Final Result      *No acute cardiopulmonary disease.  Procedures N/A    ______________________________________________________________   Documentation assistance was provided by Charlsie Quest, Scribe, on November 27, 2020 at 6:03 PM for Shaune Leeks, MD.  November 30, 2020 9:21 PM. Documentation assistance provided by the scribe. I was present during the time the encounter was recorded. The information recorded by the scribe was done at my direction and has been reviewed and validated by me.                                          Sherryl Barters, MD  11/30/20 2124

## 2020-11-28 NOTE — Unmapped (Signed)
Patient is requesting the following refill  Requested Prescriptions     Pending Prescriptions Disp Refills   ??? BELSOMRA 10 mg tablet [Pharmacy Med Name: BELSOMRA 10MG  TABLETS] 90 tablet 0     Sig: TAKE 1 TABLET(10 MG) BY MOUTH EVERY NIGHT   ??? hydrOXYzine (ATARAX) 25 MG tablet [Pharmacy Med Name: HYDROXYZINE HCL 25MG  TABS (WHITE)] 90 tablet 1     Sig: TAKE 1 TABLET(25 MG) BY MOUTH EVERY 8 HOURS AS NEEDED       Order pended. Please advise. Thanks    Last OV: 10/11/2020   Next OV: 12/11/2020

## 2020-12-07 NOTE — Unmapped (Signed)
Patient is listed for outpatient.

## 2020-12-07 NOTE — Unmapped (Signed)
Message sent to [facility] to change to extended recovery

## 2020-12-07 NOTE — Unmapped (Signed)
Pre arrival is calling about surgery questions with Dr Guy Sandifer. Please call them at [phone] ask for [facility]. I only made this urgert because its with a surgery appointment.     Thanks

## 2020-12-08 ENCOUNTER — Ambulatory Visit
Admit: 2020-12-08 | Discharge: 2020-12-09 | Payer: PRIVATE HEALTH INSURANCE | Attending: Student in an Organized Health Care Education/Training Program | Primary: Student in an Organized Health Care Education/Training Program

## 2020-12-08 DIAGNOSIS — Z794 Long term (current) use of insulin: Principal | ICD-10-CM

## 2020-12-08 DIAGNOSIS — E1165 Type 2 diabetes mellitus with hyperglycemia: Principal | ICD-10-CM

## 2020-12-08 LAB — ALBUMIN / CREATININE URINE RATIO
ALBUMIN QUANT URINE: 2.4 mg/dL
ALBUMIN/CREATININE RATIO: 32.1 ug/mg — ABNORMAL HIGH (ref 0.0–30.0)
CREATININE, URINE: 74.7 mg/dL

## 2020-12-08 NOTE — Unmapped (Addendum)
Endocrinology Clinic Follow Up Visit    ASSESSMENT AND PLAN:     1. 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 CGM downloaded and interpreted x7 days. Daily trends reviewed including patterns for persistently high BGs >300-400s with TAR >95%. Goal HbA1c 7.0%. GAD and islet cell ab negative. C-peptide 1.56 with BG 219 (02/2020).     - Increase Tresiba to 66 units at bedtime, continue uptitrate by 4 units every week until fasting BGs consistently < 180s--as BGs have been running extremely high for a long time do not want to lower her BGs too quickly  - Tighten Novolog to CR 1:8, continue ISF 2:50>150 with meals  - Continued to counsel her at length on ways to improve adherence as this is her primary barrier including setting phone reminders and linking Tresiba to brushing at night  - Continue Metformin 500mg  BID daily, she is unable to tolerate higher due to side effects  - Previously on Ozempic, stopped due to gastroparesis concerns however may resume eventually if/when BGs improved possibly Trulicity which is not as strong; may also consider SGLT-2, has history of UTIs recently and may have increased risk if started now in setting of persistently high BGs >300-400s  - We discussed using bandages to help keep Freestyle Libre from falling off during sleep  - She has no plans for pregnancy at this time and is on OCPs; have previously discussed it would be best for her to optimize her BG control prior to pregnancy  - RTC with Cathrine Muster, CDE in 2 weeks and with me in 4 weeks    Diabetes-related complications:  - Retinopathy: (+) last eye exam 05/2020 with DR  - Neuropathy: (+) foot exam completed today 12/2020 with several calluses and sores but no active s/sxs of infection, discussed proper foot care  > continue follow-up with Podiatry, referral placed to Vascular Surgery given concern for poor distal circulation  - Nephropathy: Last urine MA/C today, slightly elevated. GFR normal. G1A2  > on Losartan 25mg   - Lipids/ASCVD risk: (+) CAD/STEMI in 11/2019, last lipids with LDL 138 in 06/2020  > on Repatha 140mg  every 2 weeks and Atorvastatin 80mg , being managed by PCP and Cardiology  - Immunizations: S/p COVID vaccine    Patient was seen and staffed with Dr. Naoma Diener.    Arelia Longest, M.D. PGY-5 Endocrinology Fellow  [facility] [place]  Phone:  [phone]     Fax:  [phone]    SUBJECTIVE:     Pamela Gutierrez is a 30 y.o. 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    Regarding her diabetes, she was diagnosed at age 68. She is currently on Tresiba 60 units at bedtime, Humalog CR 1:9 with meals and ISF 2:50>150, and Metformin 500mg  BID. CGM downloaded and interpreted x7 days. Daily trends reviewed including patterns for persistently high BGs >300-400s with TAR >95%. She has had issues with Freestyle Libre falling off her arm during sleep. She has a log today with her carb calculations but does not do this everyday and admits to missing Humalog once every 5 meals and Tresiba once a week. She had the flu several weeks ago and her BGs have been higher since then.    She does not have  any blurry vision but has numbness and tingling. She is currently on Repatha and has resumed Atorvastatin. She was admitted for CAD/STEMI in 11/2019; she also has a history of HTN but no CVA. There is family history of multiple MIs in her grandmother and her father had gangrene of his lower extremity related to diabetes.     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/12/2019    Type 2 diabetes mellitus (CMS-HCC)     BS- 90-130      Past Surgical History:   Procedure Laterality Date    ABLATION OF DYSRHYTHMIC FOCUS      CARDIAC CATHETERIZATION      CHOLECYSTECTOMY  2015    CORONARY STENT PLACEMENT      ELBOW FRACTURE SURGERY      PR CATH PLACE/CORON ANGIO, IMG SUPER/INTERP,W LEFT HEART VENTRICULOGRAPHY N/A 11/12/2019    Procedure: Left Heart Catheterization;  Surgeon: Alvira Philips, MD;  Location: [facility] CATH;  Service: Cardiology    PR COMPRE EP EVAL ABLTJ 3D MAPG TX SVT N/A 06/04/2019    Procedure: Accessory Pathway Ablation;  Surgeon: Joretta Bachelor, MD;  Location: [facility] EP;  Service: Cardiology    PR ELECTROPHYS EV,R A-V PACE/REC,W/O INDUCT N/A 06/04/2019    Procedure: Comprehensive Study W IND;  Surgeon: Joretta Bachelor, MD;  Location: [facility] EP;  Service: Cardiology    PR EXC SWEAT GLAND LESN AXILL,SIMPL Right 10/30/2015    Procedure: RIGHT AXILLAE EXCISE HIDRADENITIS;  Surgeon: Oren Section, MD;  Location: [facility] OR HPR;  Service: Plastics    PR NEGATIVE PRESSURE WOUND THERAPY DME </= 50 SQ CM N/A 10/30/2015    Procedure: WOUND VAC PLACEMENT;  Surgeon: Oren Section, MD;  Location: [facility];  Service: Plastics    PR SPLIT GRFT TRUNK,ARM,LEG <100 SQCM N/A 10/30/2015    Procedure: SPLIT-THICKNESS SKIN GRAFT;  Surgeon: Oren Section, MD;  Location: [facility];  Service: Plastics          Social History Family History   Social History     Tobacco Use    Smoking status: Some Days     Types: Cigars    Smokeless tobacco: Never    Tobacco comments:     Black milds   Substance Use Topics    Alcohol use: Yes     Alcohol/week: 1.0 standard drink     Types: 1 Shots of liquor per week     Comment: occasional      Family History   Problem Relation Age of Onset    Diabetes Mother     Diabetes Father     Heart disease Maternal Grandmother     Diabetes Maternal Grandmother     Heart disease Paternal Grandmother     Melanoma Neg Hx     Basal cell carcinoma Neg Hx     Squamous cell carcinoma Neg Hx           Medications     Current Outpatient Medications:     acetaminophen (TYLENOL 8 HOUR)  650 MG CR tablet, Take 650 mg by mouth nightly., Disp: , Rfl:     acyclovir (ZOVIRAX) 400 MG tablet, TAKE 1 TABLET(400 MG) BY MOUTH THREE TIMES DAILY (Patient taking differently: PRN), Disp: 90 tablet, Rfl: 0    albuterol HFA 90 mcg/actuation inhaler, Inhale 2 puffs every four (4) hours as needed. , Disp: , Rfl:     aspirin 81 MG chewable tablet, Chew 1 tablet (81 mg total) daily. (Patient taking differently: Chew 81 mg nightly.), Disp: 30 tablet, Rfl: 11    atorvastatin (LIPITOR) 80 MG tablet, Take 1 tablet (80 mg total) by mouth daily. (Patient taking differently: Take 80 mg by mouth daily. Patient stop for 4 weeks), Disp: 90 tablet, Rfl: 1    BELSOMRA 10 mg tablet, TAKE 1 TABLET(10 MG) BY MOUTH EVERY NIGHT, Disp: 90 tablet, Rfl: 0    blood sugar diagnostic (GLUCOSE BLOOD) Strp, Test once daily and as directed.one touch verio flex testing strips, Disp: 100 strip, Rfl: 3    cetirizine 10 mg cap, Take 10 mg by mouth daily as needed. , Disp: , Rfl:     clindamycin (CLEOCIN T) 1 % lotion, Once daily to body areas that are typically affected, Disp: 60 mL, Rfl: 11    clobetasoL (TEMOVATE) 0.05 % ointment, Apply topically Two (2) times a day. As needed for flares up to a week at a time, Disp: 60 g, Rfl: 3    cyclobenzaprine (FLEXERIL) 10 MG tablet, TAKE 1 TABLET(10 MG) BY MOUTH EVERY NIGHT, Disp: 90 tablet, Rfl: 1    empty container (SHARPS-A-GATOR DISPOSAL SYSTEM) Misc, Use as directed for sharps disposal, Disp: 1 each, Rfl: 2    evolocumab (REPATHA SYRINGE) 140 mg/mL Syrg, Inject the contents of one pen (140 mg) under the skin every fourteen (14) days., Disp: 6 mL, Rfl: 3    ferrous sulfate 325 (65 FE) MG tablet, Take by mouth. Once daily PRN, Disp: , Rfl:     flash glucose scanning reader (FLASH GLUCOSE SCANNING READER), by Other route Take as directed. Dispense Libre 2 scanning reader, use as directed, Disp: 1 each, Rfl: 2    flash glucose scanning reader (FLASH GLUCOSE SCANNING READER), by Other route Take as directed. Dispense Libre 2 scanning reader, use as directed, Disp: 3 each, Rfl: 5    flash glucose sensor (FLASH GLUCOSE SENSOR) kit, Dispense Libre 2 sensors, either 1 mo supply or 3 mo supply, as per patient preference; change q 14 days, Disp: 6 each, Rfl: 3    furosemide (LASIX) 40 MG tablet, TAKE 1 TABLET BY MOUTH DAILY AS NEEDED FOR> 2 POUND WEIGHT GAIN IN A 24 HOUR PERIOD (Patient taking differently: 40 mg nightly.), Disp: 90 tablet, Rfl: 1    gabapentin (NEURONTIN) 300 MG capsule, Take 300 mg (1 capsule) in the morning, 300 mg at noon, and 600 mg (2 capsules) in the evening., Disp: 360 capsule, Rfl: 1    insulin ASPART (NOVOLOG FLEXPEN) 100 unit/mL (3 mL) injection pen, Inject 0.06 mL (6 Units total) under the skin Three (3) times a day before meals. Take 5 units three times per day with meals (2 units with snacks/small meals), plus 2 extra for every 50 points over 150. Up to 50 units per day., Disp: 15 mL, Rfl: 12    insulin degludec (TRESIBA FLEXTOUCH U-100) 100 unit/mL (3 mL) InPn, Inject 50 units every night. Increase or decrease dose accordingly as instructed by MD. Maximum of 64 units every night. (Patient taking differently: Inject 56  units every night. Increase or decrease dose accordingly as instructed by MD. Maximum of 64 units every night.), Disp: 21 mL, Rfl: 11    insulin needles, disposable, (BD ULTRA-FINE NANO PEN NEEDLES) 32 x 5/32  Ndle, Inject 1 each under the skin Five (5) times a day. Use 5x/day with Lantus, Novolog, and Victoza pens., Disp: 200 each, Rfl: 12    insulin syringe-needle U-100 (BD INSULIN SYRINGE ULT-FINE II) 0.5 mL 31 gauge x 5/16 Syrg, ok to sub any brand or size insulin syringe preferred by insurance/patient, use 5x/day, dx E11.65, Disp: 200 each, Rfl: 5    insulin syringe-needle U-100 0.3 mL 31 x 3/8 Syrg, For use with Humulin R AC TID., Disp: 300 Syringe, Rfl: 3    losartan (COZAAR) 25 MG tablet, TAKE 1 TABLET(25 MG) BY MOUTH DAILY, Disp: 90 tablet, Rfl: 3    metFORMIN (GLUCOPHAGE-XR) 500 MG 24 hr tablet, Take 2 tablets (1,000 mg total) by mouth two (2) times a day. With meals., Disp: 360 tablet, Rfl: 3    metoclopramide (REGLAN) 10 MG tablet, Take 1 tablet (10 mg total) by mouth four (4) times a day as needed. Take 1 tablet 4 times daily as neeed, Disp: 120 tablet, Rfl: 3    metoprolol succinate (TOPROL-XL) 100 MG 24 hr tablet, TAKE 1 TABLET(100 MG) BY MOUTH EVERY MORNING, Disp: 90 tablet, Rfl: 3    montelukast (SINGULAIR) 10 mg tablet, Take 10 mg by mouth daily as needed. , Disp: , Rfl:     NORLYDA 0.35 mg tablet, TAKE 1 TABLET(0.35 MG) BY MOUTH DAILY, Disp: 84 tablet, Rfl: 3    omeprazole (PRILOSEC) 20 MG capsule, Take 20 mg by mouth daily., Disp: , Rfl:     promethazine (PHENERGAN) 25 MG tablet, TAKE 1 TABLET(25 MG) BY MOUTH EVERY 6 HOURS AS NEEDED FOR NAUSEA, Disp: 60 tablet, Rfl: 3    sertraline (ZOLOFT) 50 MG tablet, Take 1.5 tablets (75 mg total) by mouth daily., Disp: 135 tablet, Rfl: 3    simethicone 125 mg cap, Take 250 mg by mouth nightly., Disp: , Rfl:     spironolactone (ALDACTONE) 100 MG tablet, TAKE 1 TABLET(100 MG) BY MOUTH DAILY, Disp: 90 tablet, Rfl: 1    triamcinolone (KENALOG) 0.1 % cream, Apply topically once as needed. , Disp: , Rfl:     amLODIPine (NORVASC) 10 MG tablet, Take 1 tablet (10 mg total) by mouth daily., Disp: 90 tablet, Rfl: 3    blood-glucose meter kit, Currently has One Touch Ultra test strips. Please issue per her formulary., Disp: 1 each, Rfl: 1         Allergies   Allergies   Allergen Reactions    Lanolin Hives    Morphine (Pf) Swelling    Tomato Other (See Comments) and Swelling    Venom-Honey Bee Swelling    Wool Hives and Swelling    Ibuprofen Other (See Comments)    Nsaids (Non-Steroidal Anti-Inflammatory Drug) Other (See Comments)    Iodinated Contrast Media Nausea And Vomiting and Rash    Ioxaglate Sodium Nausea And Vomiting          Review of Systems: 10 systems reviewed and were negative except for as noted in HPI and below.    OBJECTIVE:     Physical Exam:  BP 156/101 (BP Site: L Arm, BP Position: Sitting)  - Pulse 90  - Ht 166.4 cm (5' 5.5)  - Wt 82.3 kg (181 lb 6.4 oz)  - BMI 29.73 kg/m??  General: Well-appearing African-American female in no apparent distress  HEENT: EOMI, no exophthalmos, sclera anicteric, no thyromegaly  Cardiovascular: Regular rate and rhythm; no murmurs, rubs, or gallops  Respiratory: Clear to auscultation bilaterally; no increased work of breathing on RA  MSK: Normal station and gait; no edema  Neuro: Awake, alert, and oriented; no focal deficits; no tremors  Psych: Normal mood and affect  Skin: No abnormal skin pigmentation, no acanthosis nigricans  Feet: +Several calluses, cool to touch with diminished DP pulses, diminished pinprick and light touch sensation bilaterally    Labs:  I personally reviewed labs available in Epic prior to the start of today's visit.    Labs are significant for:  Lab Results   Component Value Date    A1C 12.1 (H) 10/31/2020    A1C 11.4 (H) 07/21/2020    A1C 12.2 (H) 05/15/2020    A1C 11.6 (H) 02/16/2020    A1C 11.5 (H) 11/12/2019     Lab Results   Component Value Date    TRIG 65 11/12/2019    CHOL 110 02/21/2020    HDL 24 (L) 02/21/2020    LDL 137.6 06/21/2020     Lab Results   Component Value Date    TSH 1.160 08/24/2020     Lab Results   Component Value Date    CREATININE 0.39 (L) 11/27/2020    GFRNAAF >90 03/29/2020    ALBCRERAT 3.7 12/13/2019     Lab Results   Component Value Date    MALBCRERAT 10.8 03/25/2014     Lab Results   Component Value Date    CPEPTIDE 1.56 02/16/2020     Lab Results   Component Value Date    NA 139 11/27/2020    K 3.8 11/27/2020    CL 105 11/27/2020    CO2 27.0 11/27/2020    BUN 8 (L) 11/27/2020    CREATININE 0.39 (L) 11/27/2020    GFR >= 60 04/08/2012    AST 10 11/27/2020    PROT 7.2 11/27/2020    ALBUMIN 3.6 11/27/2020     Imaging:  I personally reviewed imaging available in Epic prior to the start of today's visit.    Imaging is significant for: Echocardiogram (11/2019):    1. The left ventricle is normal in size with mildly increased wall  thickness.    2. The left ventricular systolic function is normal with no obvious wall  motion abnormalities, LVEF is visually estimated at > 55%.    3. The right ventricle is normal in size, with normal systolic function.    4. IVC size and inspiratory change suggest mildly elevated right atrial  pressure. (5-10 mmHg).    I reviewed and summarized (above) records in preparation for today's visit all pertinent notes in Epic/Media and CareEverywhere as well as any sent records.

## 2020-12-09 NOTE — Unmapped (Addendum)
Thank you for coming in to see [place] today!    Today we discussed your diabetes.    - We are getting labs today. I will reach out to you once all your lab results are back.    - Increase your Evaristo Bury to 66 units every night. Try to remember your best to take it right before you brush your teeth at night.    - Every Sunday, look at your blood sugar log for the past week. If your blood sugars first thing in the morning are above 180, then increase Tresiba by 4 units.    - Start taking Novolog 1 unit every 8 grams of carbs. In addition to this, you should add:  - 2 units if your pre-meal blood sugar is 150-200  - 4 units if your pre-meal blood sugar is 201-250  - 6 units if your pre-meal blood sugar is 251-300  - 8 units if your pre-meal blood sugar is 301-350  - 10 units if your pre-meal blood sugar is 351+     - You should be seen soon by [facility] Surgery. Call [phone] to schedule your appointment.    - Be sure to bring all of your medications and devices to your clinic appointments. This means all of your pill bottles as well as insulin pens or vials if you are taking insulin. You should also bring your glucometer (blood glucose meter) or blood sugar log if you have one.    Orders Placed This Encounter   Procedures    Albumin/creatinine urine ratio

## 2020-12-11 ENCOUNTER — Ambulatory Visit: Admit: 2020-12-11 | Discharge: 2020-12-12 | Payer: PRIVATE HEALTH INSURANCE

## 2020-12-11 MED ORDER — REPATHA SYRINGE 140 MG/ML SUBCUTANEOUS SYRINGE
SUBCUTANEOUS | 3 refills | 84 days | Status: CP
Start: 2020-12-11 — End: ?

## 2020-12-11 MED ORDER — HYDROXYZINE HCL 25 MG TABLET
ORAL_TABLET | Freq: Every day | ORAL | 3 refills | 90 days | Status: CP
Start: 2020-12-11 — End: 2021-12-11

## 2020-12-11 NOTE — Unmapped (Signed)
INTERNAL MEDICINE OUTPATIENT NOTE        ASSESSMENT   Diagnosis ICD-10-CM Associated Orders   1. Pain of left calf  M79.662 PVL Venous Duplex Lower Extremity Left      2. Left leg swelling  M79.89 PVL Venous Duplex Lower Extremity Left      3. Type 2 diabetes mellitus with hyperglycemia, with long-term current use of insulin (CMS-HCC)  E11.65     Z79.4       4. ST elevation myocardial infarction involving right coronary artery (CMS-HCC)  I21.11 evolocumab (REPATHA SYRINGE) 140 mg/mL Syrg      5. Hyperlipidemia, unspecified hyperlipidemia type  E78.5 evolocumab (REPATHA SYRINGE) 140 mg/mL Syrg      6. Essential hypertension  I10             PLAN  1/2. She has asymmetric swelling and pain. PVL Venous Duplex of her left lower extremity ordered today to r/o DVT. Prelim reading is negative.  3.  Last A1C was 12.1 % on 10/31/20. Encouraged her to continue working closely with the endocrinology clinic.  4. Cardiac symptoms are at goal; no suggestion of active ischemia. Continue risk factor reduction with current medications. Contact office or emergency services with any concerning chest pain / equivalent symptoms.  5. Last LDL was 137.6 on 06/21/20. Continue Repatha 140 mg every 14 days. Plan to update lipid panel with next labs.  6. Blood pressure is close to goal. Continue spironolactone 100 mg daily, amlodipine 10 mg daily, losartan 25 mg daily, and metoprolol succinate 100 mg daily.    Preventive services addressed today  We did not review preventive services today    -- Patient verbalized an understanding of today's assessment and recommendations, as well as the purpose of ongoing medications.    Medication adherence and barriers to the treatment plan have been addressed. Opportunities to optimize healthy behaviors have been discussed. Patient / caregiver voiced understanding.    Return in about 3 months (around 03/11/2021).      Reason for Visit:   Chief Complaint   Patient presents with   ??? Diabetes   ??? Leg Swelling Lumps in muscles - unable to use braces because of swelling   ??? Arm Problem     Right antecubital vein hard and itchy after IV          History of Present Illness:    This is a 30 y.o. year old female who presents for follow up on diabetes, STEMI, hyperlipidemia, and hypertension, as well as new concerns of pain of left calf and left leg swelling. She has occasional episodes of palpitations that resolve within 1 minute.    Pain of left calf / left leg swelling: She has resolving left leg pain and swelling that started after she fell off of a chair on 12/02/20. Patient manages with elevation. Has not been able to wear her braces due to swelling.    Diabetes: Last A1C was 12.1 % on 10/31/20. She is working closely with endocrinology who recently adjusted her insulin.  Current regimen includes metformin 1,000 mg twice daily, Novolog 6 units three times daily, and Tresiba 66 units daily.  Periodic interventions are overdue for annual eye exam.    Hyperlipidemia: Continues Repatha 140 mg every 14 days. No reported muscle aches or weakness.  Most recent labs show:  Lab Results   Component Value Date    LDL Direct 137.6 06/21/2020    HDL 24 (L) 02/21/2020    Triglycerides 65  11/12/2019     Hypertension: Continues spironolactone 100 mg daily, amlodipine 10 mg daily, losartan 25 mg daily, and metoprolol succinate 100 mg daily. Today's blood pressure is 136/88. No reported chest pain or shortness of breath.    REVIEW OF SYSTEMS: A comprehensive review of systems was conducted and is negative except for the above.           Medications:  Current Outpatient Medications   Medication Sig Dispense Refill   ??? acetaminophen (TYLENOL 8 HOUR) 650 MG CR tablet Take 650 mg by mouth nightly.     ??? acyclovir (ZOVIRAX) 400 MG tablet TAKE 1 TABLET(400 MG) BY MOUTH THREE TIMES DAILY (Patient taking differently: PRN) 90 tablet 0   ??? albuterol HFA 90 mcg/actuation inhaler Inhale 2 puffs every four (4) hours as needed.      ??? amLODIPine (NORVASC) 10 MG tablet Take 1 tablet (10 mg total) by mouth daily. 90 tablet 3   ??? aspirin 81 MG chewable tablet Chew 1 tablet (81 mg total) daily. (Patient taking differently: Chew 81 mg nightly.) 30 tablet 11   ??? atorvastatin (LIPITOR) 80 MG tablet Take 1 tablet (80 mg total) by mouth daily. (Patient taking differently: Take 80 mg by mouth daily. Patient stop for 4 weeks) 90 tablet 1   ??? BELSOMRA 10 mg tablet TAKE 1 TABLET(10 MG) BY MOUTH EVERY NIGHT 90 tablet 0   ??? blood sugar diagnostic (GLUCOSE BLOOD) Strp Test once daily and as directed.one touch verio flex testing strips 100 strip 3   ??? cetirizine 10 mg cap Take 10 mg by mouth daily as needed.      ??? clindamycin (CLEOCIN T) 1 % lotion Once daily to body areas that are typically affected 60 mL 11   ??? clobetasoL (TEMOVATE) 0.05 % ointment Apply topically Two (2) times a day. As needed for flares up to a week at a time 60 g 3   ??? cyclobenzaprine (FLEXERIL) 10 MG tablet TAKE 1 TABLET(10 MG) BY MOUTH EVERY NIGHT 90 tablet 1   ??? empty container (SHARPS-A-GATOR DISPOSAL SYSTEM) Misc Use as directed for sharps disposal 1 each 2   ??? ferrous sulfate 325 (65 FE) MG tablet Take by mouth. Once daily PRN     ??? flash glucose scanning reader (FLASH GLUCOSE SCANNING READER) by Other route Take as directed. Dispense Libre 2 scanning reader, use as directed 1 each 2   ??? flash glucose scanning reader (FLASH GLUCOSE SCANNING READER) by Other route Take as directed. Dispense Libre 2 scanning reader, use as directed 3 each 5   ??? flash glucose sensor (FLASH GLUCOSE SENSOR) kit Dispense Libre 2 sensors, either 1 mo supply or 3 mo supply, as per patient preference; change q 14 days 6 each 3   ??? furosemide (LASIX) 40 MG tablet TAKE 1 TABLET BY MOUTH DAILY AS NEEDED FOR> 2 POUND WEIGHT GAIN IN A 24 HOUR PERIOD (Patient taking differently: 40 mg nightly.) 90 tablet 1   ??? gabapentin (NEURONTIN) 300 MG capsule Take 300 mg (1 capsule) in the morning, 300 mg at noon, and 600 mg (2 capsules) in the evening. 360 capsule 1   ??? insulin ASPART (NOVOLOG FLEXPEN) 100 unit/mL (3 mL) injection pen Inject 0.06 mL (6 Units total) under the skin Three (3) times a day before meals. Take 5 units three times per day with meals (2 units with snacks/small meals), plus 2 extra for every 50 points over 150. Up to 50 units per day. 15 mL  12   ??? insulin degludec (TRESIBA FLEXTOUCH U-100) 100 unit/mL (3 mL) InPn Inject 50 units every night. Increase or decrease dose accordingly as instructed by MD. Maximum of 64 units every night. (Patient taking differently: Inject 56 units every night. Increase or decrease dose accordingly as instructed by MD. Maximum of 64 units every night.) 21 mL 11   ??? insulin needles, disposable, (BD ULTRA-FINE NANO PEN NEEDLES) 32 x 5/32  Ndle Inject 1 each under the skin Five (5) times a day. Use 5x/day with Lantus, Novolog, and Victoza pens. 200 each 12   ??? insulin syringe-needle U-100 (BD INSULIN SYRINGE ULT-FINE II) 0.5 mL 31 gauge x 5/16 Syrg ok to sub any brand or size insulin syringe preferred by insurance/patient, use 5x/day, dx E11.65 200 each 5   ??? insulin syringe-needle U-100 0.3 mL 31 x 3/8 Syrg For use with Humulin R AC TID. 300 Syringe 3   ??? losartan (COZAAR) 25 MG tablet TAKE 1 TABLET(25 MG) BY MOUTH DAILY 90 tablet 3   ??? metFORMIN (GLUCOPHAGE-XR) 500 MG 24 hr tablet Take 2 tablets (1,000 mg total) by mouth two (2) times a day. With meals. 360 tablet 3   ??? metoclopramide (REGLAN) 10 MG tablet Take 1 tablet (10 mg total) by mouth four (4) times a day as needed. Take 1 tablet 4 times daily as neeed 120 tablet 3   ??? metoprolol succinate (TOPROL-XL) 100 MG 24 hr tablet TAKE 1 TABLET(100 MG) BY MOUTH EVERY MORNING 90 tablet 3   ??? montelukast (SINGULAIR) 10 mg tablet Take 10 mg by mouth daily as needed.      ??? NORLYDA 0.35 mg tablet TAKE 1 TABLET(0.35 MG) BY MOUTH DAILY 84 tablet 3   ??? omeprazole (PRILOSEC) 20 MG capsule Take 20 mg by mouth daily.     ??? promethazine (PHENERGAN) 25 MG tablet TAKE 1 TABLET(25 MG) BY MOUTH EVERY 6 HOURS AS NEEDED FOR NAUSEA 60 tablet 3   ??? sertraline (ZOLOFT) 50 MG tablet Take 1.5 tablets (75 mg total) by mouth daily. 135 tablet 3   ??? simethicone 125 mg cap Take 250 mg by mouth nightly.     ??? spironolactone (ALDACTONE) 100 MG tablet TAKE 1 TABLET(100 MG) BY MOUTH DAILY 90 tablet 1   ??? triamcinolone (KENALOG) 0.1 % cream Apply topically once as needed.      ??? blood-glucose meter kit Currently has One Touch Ultra test strips. Please issue per her formulary. 1 each 1   ??? evolocumab (REPATHA SYRINGE) 140 mg/mL Syrg Inject the contents of one pen (140 mg) under the skin every fourteen (14) days. 6 mL 3   ??? hydrOXYzine (ATARAX) 25 MG tablet Take 1 tablet (25 mg total) by mouth in the morning. 90 tablet 3     No current facility-administered medications for this visit.            PHYSICAL EXAM:  BP 136/88 (BP Site: L Arm, BP Position: Sitting, BP Cuff Size: Medium)  - Pulse 83  - Temp 37.2 ??C (98.9 ??F) (Oral)  - Wt 82.9 kg (182 lb 12.8 oz)  - SpO2 99%  - BMI 29.96 kg/m??     GENERAL:  This is a pleasant female in no distress.  The patient maintains good eye contact, good judgment, fluent speech, normal affect.  EYES:  Pupils are equal, round and reactive to light.  Anicteric sclerae.  ENT:  Tympanic membranes are clear bilaterally.  NECK:  Supple, no lymphadenopathy.  THYROID: No  enlargement or nodules.   LUNGS:  Relaxed respiratory effort.  Clear to auscultation bilaterally.   CARDIOVASCULAR:  Regular rate and rhythm, no murmurs.   ABDOMEN:  Normal bowel sounds, soft, nontender, nondistended, no masses or organomegaly.   SKIN: Her legs are cold. No concerning rashes or lesions.   EXTREMITIES:  Left lower extremity swelling and calf tenderness.      BP Readings from Last 3 Encounters:   12/11/20 136/88   12/08/20 156/101   11/27/20 126/84      Wt Readings from Last 3 Encounters:   12/11/20 82.9 kg (182 lb 12.8 oz)   12/08/20 82.3 kg (181 lb 6.4 oz)   11/23/20 81.1 kg (178 lb 12.8 oz)          Note - This record has been created using [employer]. Chart creation errors have been sought, but may not always have been located. Such creation errors do not reflect on the standard of medical care.    I attest that I, Maurice Small, personally documented this note while acting as scribe for Sherol Dade, MD.      Maurice Small, Scribe.  12/11/2020     The documentation recorded by the scribe accurately reflects the service I personally performed and the decisions made by me.    Sherol Dade, MD

## 2020-12-11 NOTE — Unmapped (Signed)
Attempt to schedule Pre-Procedure Appointment- left message to call [phone].

## 2020-12-12 ENCOUNTER — Ambulatory Visit: Admit: 2020-12-12 | Discharge: 2020-12-13 | Payer: PRIVATE HEALTH INSURANCE

## 2020-12-12 DIAGNOSIS — L732 Hidradenitis suppurativa: Principal | ICD-10-CM

## 2020-12-12 NOTE — Unmapped (Signed)
Telephone call to patient to schedule appointment- patient in agreement with date, time, and place.    TC to patient to schedule virtual visit (tele or video). Patient confirms access to smartphone, call or text (PHONE #) for visit. Patient is aware they will receive three calls near appointment time: one from registration, one from Nurse and one (call or texted link for video) from Provider.

## 2020-12-12 NOTE — Unmapped (Signed)
Thanks for choosing [facility] at [place]  for your medical care!    If you have any questions about your visit today, please call us at [phone].      For medication refills, please have your pharmacist send an electronic refill request    If you need care after 5:00 pm during the week or on the weekend:  Call [facility] at [phone] for nurse/physician advice or...    Go to  Urgent Care walk-in clinic [address], [address], [place] [phone] -- Open 7 days a week from 9:00AM - 8:00PM        We will always try to notify you of the results from laboratory tests within ten days of the study.  If you do not hear from us by phone, letter, or electronic message, please call the office immediately for further information.

## 2020-12-12 NOTE — Unmapped (Signed)
Outgoing telephone call to patient in attempt to schedule PAT visit prior to upcoming surgery 12/15/2020 with Dr Guy Sandifer.    Attempt to schedule Pre-Procedure Appointment- left message to call [phone]

## 2020-12-13 ENCOUNTER — Ambulatory Visit: Admit: 2020-12-13 | Discharge: 2020-12-13 | Payer: PRIVATE HEALTH INSURANCE

## 2020-12-13 ENCOUNTER — Telehealth: Admit: 2020-12-13 | Discharge: 2020-12-13 | Payer: PRIVATE HEALTH INSURANCE

## 2020-12-13 DIAGNOSIS — Z01812 Encounter for preprocedural laboratory examination: Principal | ICD-10-CM

## 2020-12-13 DIAGNOSIS — Z20822 Encounter for preoperative screening laboratory testing for COVID-19 virus: Principal | ICD-10-CM

## 2020-12-13 NOTE — Unmapped (Signed)
covid pre-op testing

## 2020-12-14 NOTE — Unmapped (Addendum)
The type of anesthesia reviewed for your surgery was general anesthesia . The final plan will be discussed the morning of surgery by your anesthesia care team.  .    Please follow the eating and drinking instructions reviewed by the  Pre-Procedure Services Clinic nurse.  Consider drinking 8 to 12 ounces of a sports drink (gatorade, powerade) at least 2 hours prior to your arrival time for the procedure   Take half (33 units) your long acting insulin dose     Please take only the medications listed below     Per Anesthesia's guidelines:    Please take the following medications the morning of your procedure with a sip of water:  Zoloft, Phenergan, Gabapentin, Omeprazole

## 2020-12-15 ENCOUNTER — Ambulatory Visit: Admit: 2020-12-15 | Discharge: 2020-12-16 | Payer: PRIVATE HEALTH INSURANCE

## 2020-12-15 ENCOUNTER — Encounter: Admit: 2020-12-15 | Discharge: 2020-12-16 | Payer: PRIVATE HEALTH INSURANCE | Attending: Urology | Primary: Urology

## 2020-12-15 ENCOUNTER — Encounter
Admit: 2020-12-15 | Discharge: 2020-12-16 | Payer: PRIVATE HEALTH INSURANCE | Attending: Anesthesiology | Primary: Anesthesiology

## 2020-12-15 MED ORDER — DOCUSATE SODIUM 100 MG CAPSULE
ORAL_CAPSULE | Freq: Two times a day (BID) | ORAL | 0 refills | 15 days | Status: CP
Start: 2020-12-15 — End: 2020-12-30
  Filled 2020-12-16: qty 30, 15d supply, fill #0

## 2020-12-15 MED ORDER — OXYCODONE-ACETAMINOPHEN 5 MG-325 MG TABLET
ORAL_TABLET | ORAL | 0 refills | 2 days | Status: CP | PRN
Start: 2020-12-15 — End: 2020-12-20
  Filled 2020-12-16: qty 10, 2d supply, fill #0

## 2020-12-15 MED ORDER — AMOXICILLIN 875 MG-POTASSIUM CLAVULANATE 125 MG TABLET
ORAL_TABLET | Freq: Two times a day (BID) | ORAL | 0 refills | 7 days | Status: CP
Start: 2020-12-15 — End: 2020-12-22
  Filled 2020-12-16: qty 14, 7d supply, fill #0

## 2020-12-15 MED ADMIN — dexamethasone (DECADRON) 4 mg/mL injection: INTRAVENOUS | @ 13:00:00 | Stop: 2020-12-15

## 2020-12-15 MED ADMIN — insulin regular (HumuLIN,NovoLIN) injection 6 Units: 6 [IU] | INTRAVENOUS | @ 12:00:00 | Stop: 2020-12-15

## 2020-12-15 MED ADMIN — metoprolol succinate (TOPROL-XL) 24 hr tablet 100 mg: 100 mg | ORAL | @ 21:00:00

## 2020-12-15 MED ADMIN — propofoL (DIPRIVAN) injection: INTRAVENOUS | @ 13:00:00 | Stop: 2020-12-15

## 2020-12-15 MED ADMIN — bupivacaine-epinephrine (PF) (MARCAINE-PF w/EPI) 0.25 %-1:200,000 injection (PF): @ 14:00:00 | Stop: 2020-12-15

## 2020-12-15 MED ADMIN — labetaloL (NORMODYNE,TRANDATE) injection: INTRAVENOUS | @ 14:00:00 | Stop: 2020-12-15

## 2020-12-15 MED ADMIN — oxyCODONE (ROXICODONE) immediate release tablet 5 mg: 5 mg | ORAL | @ 21:00:00 | Stop: 2020-12-29

## 2020-12-15 MED ADMIN — ferrous sulfate tablet 325 mg: 325 mg | ORAL | @ 21:00:00

## 2020-12-15 MED ADMIN — esmoloL (BREVIBLOC) injection: INTRAVENOUS | @ 13:00:00 | Stop: 2020-12-15

## 2020-12-15 MED ADMIN — oxyCODONE (ROXICODONE) immediate release tablet 5 mg: 5 mg | ORAL | @ 15:00:00 | Stop: 2020-12-15

## 2020-12-15 MED ADMIN — lidocaine (XYLOCAINE) 20 mg/mL (2 %) injection: INTRAVENOUS | @ 13:00:00 | Stop: 2020-12-15

## 2020-12-15 MED ADMIN — sodium chloride (NS) 0.9 % infusion: 100 mL/h | INTRAVENOUS | @ 21:00:00

## 2020-12-15 MED ADMIN — hydrOXYzine (ATARAX) tablet 25 mg: 25 mg | ORAL | @ 21:00:00

## 2020-12-15 MED ADMIN — gabapentin (NEURONTIN) capsule 300 mg: 300 mg | ORAL | @ 21:00:00

## 2020-12-15 MED ADMIN — losartan (COZAAR) tablet 25 mg: 25 mg | ORAL | @ 21:00:00

## 2020-12-15 MED ADMIN — amLODIPine (NORVASC) tablet 10 mg: 10 mg | ORAL | @ 21:00:00

## 2020-12-15 MED ADMIN — pantoprazole (PROTONIX) EC tablet 20 mg: 20 mg | ORAL | @ 21:00:00

## 2020-12-15 MED ADMIN — clindamycin (CLEOCIN) 600 mg/50 mL IVPB 600 mg: 600 mg | INTRAVENOUS | @ 21:00:00 | Stop: 2020-12-16

## 2020-12-15 MED ADMIN — VECuronium (NORCURON) injection: INTRAVENOUS | @ 13:00:00 | Stop: 2020-12-15

## 2020-12-15 MED ADMIN — enoxaparin (LOVENOX) syringe 40 mg: 40 mg | SUBCUTANEOUS | @ 12:00:00 | Stop: 2020-12-15

## 2020-12-15 MED ADMIN — sertraline (ZOLOFT) tablet 75 mg: 75 mg | ORAL | @ 21:00:00

## 2020-12-15 MED ADMIN — fentaNYL (PF) (SUBLIMAZE) injection: INTRAVENOUS | @ 13:00:00 | Stop: 2020-12-15

## 2020-12-15 MED ADMIN — midazolam (VERSED) injection: INTRAVENOUS | @ 12:00:00 | Stop: 2020-12-15

## 2020-12-15 MED ADMIN — spironolactone (ALDACTONE) tablet 100 mg: 100 mg | ORAL | @ 21:00:00

## 2020-12-15 MED ADMIN — fentaNYL (PF) (SUBLIMAZE) injection 25 mcg: 25 ug | INTRAVENOUS | @ 15:00:00 | Stop: 2020-12-15

## 2020-12-15 MED ADMIN — ondansetron (ZOFRAN) injection: INTRAVENOUS | @ 13:00:00 | Stop: 2020-12-15

## 2020-12-15 MED ADMIN — succinylcholine (ANECTINE) injection: INTRAVENOUS | @ 13:00:00 | Stop: 2020-12-15

## 2020-12-15 MED ADMIN — ampicillin-sulbactam (UNASYN) 3 g in sodium chloride 0.9 % (NS) 100 mL IVPB connector bag: 3 g | INTRAVENOUS | @ 13:00:00 | Stop: 2020-12-15

## 2020-12-15 MED ADMIN — lactated Ringers infusion: 10 mL/h | INTRAVENOUS | @ 12:00:00

## 2020-12-15 NOTE — Unmapped (Signed)
Pt arrived from PACU. On arrival pt A&Ox4, VSS and afebrile. Pt oriented to room, call light in reach. No new concerns at this time.      Problem: Adult Inpatient Plan of Care  Goal: Plan of Care Review  12/15/2020 1407 by Johnn Hai, RN  Outcome: Progressing  12/15/2020 1406 by Johnn Hai, RN  Outcome: Progressing  Goal: Patient-Specific Goal (Individualized)  12/15/2020 1407 by Johnn Hai, RN  Outcome: Progressing  12/15/2020 1406 by Johnn Hai, RN  Outcome: Progressing  Goal: Absence of Hospital-Acquired Illness or Injury  12/15/2020 1407 by Johnn Hai, RN  Outcome: Progressing  12/15/2020 1406 by Johnn Hai, RN  Outcome: Progressing  Intervention: Identify and Manage Fall Risk  Recent Flowsheet Documentation  Taken 12/15/2020 1403 by Johnn Hai, RN  Safety Interventions:  ??? low bed  ??? family at bedside  ??? fall reduction program maintained  Intervention: Prevent and Manage VTE (Venous Thromboembolism) Risk  Recent Flowsheet Documentation  Taken 12/15/2020 1403 by Johnn Hai, RN  Activity Management: activity adjusted per tolerance  Goal: Optimal Comfort and Wellbeing  12/15/2020 1407 by Johnn Hai, RN  Outcome: Progressing  12/15/2020 1406 by Johnn Hai, RN  Outcome: Progressing  Goal: Readiness for Transition of Care  12/15/2020 1407 by Johnn Hai, RN  Outcome: Progressing  12/15/2020 1406 by Johnn Hai, RN  Outcome: Progressing  Goal: Rounds/Family Conference  12/15/2020 1407 by Johnn Hai, RN  Outcome: Progressing  12/15/2020 1406 by Johnn Hai, RN  Outcome: Progressing     Problem: Pain Acute  Goal: Acceptable Pain Control and Functional Ability  Outcome: Progressing     Problem: Fall Injury Risk  Goal: Absence of Fall and Fall-Related Injury  Outcome: Progressing  Intervention: Promote Injury-Free Environment  Recent Flowsheet Documentation  Taken 12/15/2020 1403 by Johnn Hai, RN  Safety Interventions:  ??? low bed  ??? family at bedside  ??? fall reduction program maintained Problem: Bleeding (Surgery Nonspecified)  Goal: Absence of Bleeding  Outcome: Progressing     Problem: Bowel Motility Impaired (Surgery Nonspecified)  Goal: Effective Bowel Elimination  Outcome: Progressing     Problem: Fluid and Electrolyte Imbalance (Surgery Nonspecified)  Goal: Fluid and Electrolyte Balance  Outcome: Progressing     Problem: Glycemic Control Impaired (Surgery Nonspecified)  Goal: Blood Glucose Level Within Targeted Range  Outcome: Progressing     Problem: Risk for Infection (Surgery Nonspecified)  Goal: Absence of Infection Signs and Symptoms  Outcome: Progressing     Problem: Ongoing Anesthesia Effects (Surgery Nonspecified)  Goal: Anesthesia/Sedation Recovery  Outcome: Progressing  Intervention: Optimize Anesthesia Recovery  Recent Flowsheet Documentation  Taken 12/15/2020 1403 by Johnn Hai, RN  Safety Interventions:  ??? low bed  ??? family at bedside  ??? fall reduction program maintained     Problem: Pain (Surgery Nonspecified)  Goal: Acceptable Pain Control  Outcome: Progressing     Problem: Postoperative Nausea and Vomiting (Surgery Nonspecified)  Goal: Nausea and Vomiting Relief  Outcome: Progressing     Problem: Postoperative Urinary Retention (Surgery Nonspecified)  Goal: Effective Urinary Elimination  Outcome: Progressing     Problem: Respiratory Compromise (Surgery Nonspecified)  Goal: Effective Oxygenation and Ventilation  Outcome: Progressing

## 2020-12-15 NOTE — Unmapped (Signed)
PREOPERATIVE DIAGNOSIS  Hidradenitis suppurativa [L73.2]    POSTOPERATIVE DIAGNOSIS  Hidradenitis suppurativa [L73.2]    PROCEDURES PERFORMED  Removal sweat gland lesion/hidradentis - perianal, perineal, umbilical; complex repair (11471)   Repair, complex, scalp, arms, and/or legs; each additional 5 cm or less, 13122  Partial vulvectomy (54098)    SURGEONS  Surgeon(s) and Role:     * Turner Daniels, MD - Primary     * Yousef Gari Crown, MD - Resident - Assisting     * Georgie Chard, MD - Fellow - Interventional     ANESTHESIA  Anesthesiologist: Alba Cory, MD  Anesthesia Provider: Corey Harold, CRNA; Barnie Del, CRNA    SPECIMENS  Order Name Source Comment Collection Info Order Time   SURGICAL PATHOLOGY EXAM Vulva Pre-op diagnosis:  L73.2 Collected By: Turner Daniels, MD 12/15/2020  8:25 AM       IMPLANTS  * No implants in log *    DRAINS  None    ESTIMATED BLOOD LOSS  2 mL    COMPLICATIONS  None    CONDITION  Stable    FINDINGS  Lack of erythema, fluctuance, drainage made identification of lesions difficult  Excised indurated tissue on right labia majora  Incised possible tracts in right labia majora and left labia majora  Closed in layers    INDICATIONS FOR SURGERY   Pamela Gutierrez is a 30 y.o. female. After extensive discussion of risks, benefits and alternatives the patient wished to proceed.    OPERATIVE TECHNIQUE     Informed consent was obtained. The patient's identity was confirmed in the pre-operative holding area, and the patient was brought back to the operating room by anesthesia then sedated and intubated. Peri-operative IV antibiotics were administered prior to surgery start.    Incision was made through indurated tissue in right labia majora. Diseased tissue was removed, sent to pathology. Wound was irrigated copiously and closed in layers.    Incised was made through indurated tissue in left labia majora. Wound was irrigated copiously and closed in layers.    The procedure was tolerated well. There were no immediate complications.    PLAN  Admit to GU per anesthesia request  Fu 2-4 weeks for post-op check. Message sent to [facility].    I, Pamela Gutierrez, was present, participating and scrubbed for the duration of the procedure.

## 2020-12-15 NOTE — Unmapped (Cosign Needed)
[facility] Discharge Summary    Admit date: 12/15/2020    Discharge date and time: 12/16/20     Discharge to:  Home    Discharge Service: Surg Urology (SRU)    Discharge Attending Physician: Dr. Nancie Neas    Discharge  Diagnoses: Hidradenitis suppurativa    Secondary Diagnosis:   Principal Problem:    Hidradenitis suppurativa    hypertension and GERD    OR Procedures: EXCISION OF SKIN & SUBCUTANEOUS TISSUE FOR HIDRADENITIS, INGUINAL; WITH SIMPLE OR INTERMEDIATE REPAIR  EXCISION OF SKIN & SUBCUTANEOUS TISSUE FOR HIDRADENITIS, INGUINAL; WITH COMPLEX REPAIR  EXCISION SKIN/SUBCUTANEOUS TISSUE FOR HIDRADENITIS, PERIANAL, PERINEAL, UMBILICAL; SIMPLE/INTERMEDIATE REPR  EXCISION OF SKIN & SUBCUTANEOUS TISSUE FOR HIDRADENITIS, PERIANAL, PERINEAL, OR UMBILICAL; COMPLEX REPAIR  EXCISION, TUMOR, SOFT TISSUE OF ABDOMINAL WALL, SUBCUTANEOUS; 3 CM OR GREATER  EXCISION, TUMOR, SOFT TISSUE OF PELVIS AND HIP AREA, SUBCUTANEOUS; 3 CM OR GREATER  EXCISION, BENIGN LESION INCLUDING MARGINS, SCALP/NECK/HANDS/FEET/GENITALIA; EXCISED DIAMETER OVER 4.0 CM 12/15/2020     Ancillary Procedures: no procedures    Condition at Discharge: good  Patient is back to their functional baseline and is self-managing all activites of daily living.     Discharge Day Services:  The patient was seen and examined by the Urology team both in the morning and immediately prior to discharge.  Vital signs and laboratory values were stable and within normal limits.  The physical exam was benign and unchanged and all surgical wounds were examined.  Discharge instructions were explained and all questions answered.    Subjective   No acute events overnight. Pain Controlled. No fever or chills.    Objective  Patient Vitals for the past 8 hrs:   BP Temp Temp src Pulse Resp SpO2   12/16/20 0412 108/64 36.3 ??C (97.3 ??F) Temporal 62 18 95 %   12/16/20 0045 134/80 36 ??C (96.8 ??F) Temporal 60 17 98 %   12/16/20 0000 -- -- -- 91 -- --     No intake/output data recorded.    General Appearance:    No acute distress  Lungs:                 Normal work of breathing on room air  Heart:                            Regular rate and rhythm  Abdomen:                 Soft, non-tender, non-distended  GU:   Well approximated labial incisions, C/D/I. Voiding spontaneously.   Extremities:               Warm and well perfused        Hospital Course:   HPI:  30yoF with PMH Hidradenitis suppurativa presenting for unroofing of fistuluos tracts.    The patient underwent unroofing of at bedtime fistulous tracts of the genitals on 12/15/2020.  The patient tolerated the procedure well, was extubated in the OR, and afterwards was taken to the PACU for routine post-surgical care. When stable the patient was transferred to the floor.   The patient did well postoperatively.  The patient???s diet was slowly advanced and at the time of discharge was tolerating a regular diet.  The patient was discharged home POD1, at which point was tolerating a regular solid diet, was able to void spontaneously, have adequate pain control with P.O. pain medication, and could ambulate without difficulty.Patient  was continued on unasyn while in house. Discharged on augmentin. The patient will follow up with urology for post-op check They were given 10 pills percocet at discharge.       Body mass index is 30.1 kg/m??.                               Condition at Discharge: Improved  Discharge Medications:      Your Medication List      START taking these medications    amoxicillin-clavulanate 875-125 mg per tablet  Commonly known as: AUGMENTIN  Take 1 tablet by mouth every twelve (12) hours for 7 days.     docusate sodium 100 MG capsule  Commonly known as: COLACE  Take 1 capsule (100 mg total) by mouth Two (2) times a day for 15 days.     oxyCODONE-acetaminophen 5-325 mg per tablet  Commonly known as: PERCOCET  Take 1 tablet by mouth every four (4) hours as needed for pain .        CHANGE how you take these medications acyclovir 400 MG tablet  Commonly known as: ZOVIRAX  TAKE 1 TABLET(400 MG) BY MOUTH THREE TIMES DAILY  What changed: See the new instructions.     aspirin 81 MG chewable tablet  Chew 1 tablet (81 mg total) daily.  What changed: when to take this     furosemide 40 MG tablet  Commonly known as: LASIX  TAKE 1 TABLET BY MOUTH DAILY AS NEEDED FOR> 2 POUND WEIGHT GAIN IN A 24 HOUR PERIOD  What changed: See the new instructions.     TRESIBA FLEXTOUCH U-100 100 unit/mL (3 mL) Inpn  Generic drug: insulin degludec  Inject 50 units every night. Increase or decrease dose accordingly as instructed by MD. Maximum of 64 units every night.  What changed: additional instructions        CONTINUE taking these medications    acetaminophen 650 MG CR tablet  Commonly known as: TYLENOL 8 HOUR  Take 650 mg by mouth nightly.     albuterol 90 mcg/actuation inhaler  Commonly known as: PROVENTIL HFA;VENTOLIN HFA  Inhale 2 puffs every four (4) hours as needed.     amLODIPine 10 MG tablet  Commonly known as: NORVASC  Take 1 tablet (10 mg total) by mouth daily.     BELSOMRA 10 mg tablet  Generic drug: suvorexant  TAKE 1 TABLET(10 MG) BY MOUTH EVERY NIGHT     blood-glucose meter kit  Currently has One Touch Ultra test strips. Please issue per her formulary.     cetirizine 10 mg Cap  Take 10 mg by mouth daily as needed.     clindamycin 1 % lotion  Commonly known as: CLEOCIN T  Once daily to body areas that are typically affected     clobetasoL 0.05 % ointment  Commonly known as: TEMOVATE  Apply topically Two (2) times a day. As needed for flares up to a week at a time     cyclobenzaprine 10 MG tablet  Commonly known as: FLEXERIL  TAKE 1 TABLET(10 MG) BY MOUTH EVERY NIGHT     empty container Misc  Commonly known as: SHARPS-A-GATOR DISPOSAL SYSTEM  Use as directed for sharps disposal     ferrous sulfate 325 (65 FE) MG tablet  Take by mouth. Once daily PRN     flash glucose scanning reader  Generic drug: flash glucose scanning reader  by Other route  Take as directed. Dispense Libre 2 scanning reader, use as directed     flash glucose scanning reader  Generic drug: flash glucose scanning reader  by Other route Take as directed. Dispense Libre 2 scanning reader, use as directed     flash glucose sensor kit  Generic drug: flash glucose sensor  Dispense Libre 2 sensors, either 1 mo supply or 3 mo supply, as per patient preference; change q 14 days     gabapentin 300 MG capsule  Commonly known as: NEURONTIN  Take 300 mg (1 capsule) in the morning, 300 mg at noon, and 600 mg (2 capsules) in the evening.     glucose blood Strp  Generic drug: blood sugar diagnostic  Test once daily and as directed.one touch verio flex testing strips     hydrOXYzine 25 MG tablet  Commonly known as: ATARAX  Take 1 tablet (25 mg total) by mouth in the morning.     insulin ASPART 100 unit/mL (3 mL) injection pen  Commonly known as: NovoLOG FLEXPEN  Inject 0.06 mL (6 Units total) under the skin Three (3) times a day before meals. Take 5 units three times per day with meals (2 units with snacks/small meals), plus 2 extra for every 50 points over 150. Up to 50 units per day.     insulin syringe-needle U-100 0.3 mL 31 x 3/8 (10 mm) Syrg  For use with Humulin R AC TID.     insulin syringe-needle U-100 0.5 mL 31 gauge x 5/16 (8 mm) Syrg  Commonly known as: BD INSULIN SYRINGE ULT-FINE II  ok to sub any brand or size insulin syringe preferred by insurance/patient, use 5x/day, dx E11.65     losartan 25 MG tablet  Commonly known as: COZAAR  TAKE 1 TABLET(25 MG) BY MOUTH DAILY     metFORMIN 500 MG 24 hr tablet  Commonly known as: GLUCOPHAGE-XR  Take 2 tablets (1,000 mg total) by mouth two (2) times a day. With meals.     metoclopramide 10 MG tablet  Commonly known as: REGLAN  Take 1 tablet (10 mg total) by mouth four (4) times a day as needed. Take 1 tablet 4 times daily as neeed     metoprolol succinate 100 MG 24 hr tablet  Commonly known as: TOPROL-XL  TAKE 1 TABLET(100 MG) BY MOUTH EVERY MORNING     montelukast 10 mg tablet  Commonly known as: SINGULAIR  Take 10 mg by mouth daily as needed.     NORLYDA 0.35 mg tablet  Generic drug: norethindrone  TAKE 1 TABLET(0.35 MG) BY MOUTH DAILY     omeprazole 20 MG capsule  Commonly known as: PriLOSEC  Take 20 mg by mouth daily.     ondansetron 4 MG tablet  Commonly known as: ZOFRAN  Take 4 mg by mouth every eight (8) hours as needed for nausea.     pen needle, diabetic 32 gauge x 5/32 (4 mm) Ndle  Commonly known as: BD ULTRA-FINE NANO PEN NEEDLE  Inject 1 each under the skin Five (5) times a day. Use 5x/day with Lantus, Novolog, and Victoza pens.     promethazine 25 MG tablet  Commonly known as: PHENERGAN  TAKE 1 TABLET(25 MG) BY MOUTH EVERY 6 HOURS AS NEEDED FOR NAUSEA     REPATHA SYRINGE 140 mg/mL Syrg  Generic drug: evolocumab  Inject the contents of one pen (140 mg) under the skin every fourteen (14) days.     sertraline 50 MG tablet  Commonly known as: ZOLOFT  Take 1.5 tablets (75 mg total) by mouth daily.     simethicone 125 mg Cap  Take 250 mg by mouth nightly.     spironolactone 100 MG tablet  Commonly known as: ALDACTONE  TAKE 1 TABLET(100 MG) BY MOUTH DAILY     triamcinolone 0.1 % cream  Commonly known as: KENALOG  Apply topically once as needed.        ASK your doctor about these medications    atorvastatin 80 MG tablet  Commonly known as: LIPITOR  Take 1 tablet (80 mg total) by mouth daily.     azithromycin 250 MG tablet  Commonly known as: ZITHROMAX  Take 2 tablets (500 mg total) by mouth daily for 1 day, THEN 1 tablet (250 mg total) daily for 5 days.  Start taking on: November 27, 2020  Ask about: Should I take this medication?     nitroglycerin 0.4 MG SL tablet  Commonly known as: NITROSTAT  Place 1 tablet (0.4 mg total) under the tongue every five (5) minutes as needed for chest pain. Maximum of 3 doses in 15 minutes.  Ask about: Should I take this medication?     oseltamivir 75 MG capsule  Commonly known as: TAMIFLU  Take 1 capsule (75 mg total) by mouth Two (2) times a day for 5 days.  Ask about: Should I take this medication?            Pending Test Results:     Discharge Instructions:   Appointments which have been scheduled for you    Dec 20, 2020  3:00 PM  (Arrive by 2:45 PM)  DIABETIC EDUCATION with Frederich Balding  [facility] DIABETES AND ENDOCRINOLOGY [place] [facility] REGION) [address]  [place] [address]  [phone]      Dec 25, 2020 12:00 AM  REMOTE ILR with [facility] EP REMOTE MONITORING  [facility] EP REMOTE MONITORING [facility] REGION) [address]  [address] [place] [address]  [phone]      Jan 19, 2021  1:20 PM  (Arrive by 1:05 PM)  RETURN  DIABETES with Arelia Longest, MD  [facility] DIABETES AND ENDOCRINOLOGY [place] [facility] REGION) [address]  [place] [address]  [phone]      Feb 09, 2021  3:15 PM  (Arrive by 3:00 PM)  RETURN  FOOT AND ANKLE with Ancil Linsey, NP  [facility] ORTHOPAEDICS [place] [facility] REGION) [address]  [address]  [place] [address]  [phone]      Feb 23, 2021  3:00 PM  (Arrive by 2:45 PM)  NEW  GYN with Rica Koyanagi, CNM  [facility] GENERAL OB GYN [place] [facility]) [address]  [address]  [place] [address]  [phone]      Mar 12, 2021  3:20 PM  (Arrive by 3:05 PM)  FOLLOW UP with Loistine Chance, MD  [facility] INTERNAL MEDICINE [place] [facility]) 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 Hx of asthma, recent ED visit 11/2020, +influenza A    Discharge instructions      You underwent Unroofing of hidradenitis tracts with Dr. Guy Sandifer of [facility] Department of Urologic Surgery on 12/15/20.   You are being discharged home.    You will follow up in Urology clinic for post op check. Expect a phone call to schedule this appointment.     You have been prescribed percocet as needed for pain control. .   You have been prescribed  colace as stool softeners to help with bowel movements.     If you have not already been scheduled for a follow-up clinic visit, you will soon receive a call to schedule your appointment. If you do not hear from [place] in the next few business days, please call the clinic at the number below.     Contact [facility] at the numbers below for fever >101.63F by mouth, uncontrolled nausea or vomiting, pain uncontrolled by medication, decreased urine output, signs of wound infection (rapidly spreading redness/swelling, purulent discharge, increasing bleeding from wounds, or separation of wound); also call for any new or concerning symptoms.     - During normal business hours, call Urology clinic at [phone]    - After hours and on weekends, please call [phone] and ask for the urology resident on call. Please be patient and know that this resident is responsible for answering all hospital and outside calls during this time, and will be back in touch with you as soon as possible. If it is during regular hours, please call the clinic as you will get a quicker response. If you are unable to get in touch with anyone and you feel it is an emergency, proceed to the nearest ER or call an ambulance.     - General questions about appointments: [phone]    Resume your regular diet.    Slowly resume activity as tolerated, avoiding strenuous activity or heavy lifting for 4 weeks (nothing greater than 10 pounds which is about 1 gallon of milk). Walk and be active as much as possible. It is ok to climb stairs if you were physically able to do so before the operation.     It is ok to shower 48 hours after surgery and to gently wash incisions with soap and water. Do not scrub over the incisions, simply let soap and water run over them.  Do not soak the incision (such as a bath) for 2 weeks after surgery.     Do not drive while taking narcotic pain medications. Take Colace and/or Senna while taking narcotic pain medication. You may take over-the-counter Laxatives such as Miralax, Senna, or Milk of Magnesia. Stop taking these medications if you develop diarrhea.    Patient should avoid: driving or operating machinery, activities that require balance or risk of fall, making important decisions or signing legal paperwork, effects of anesthesia may last for several hours, supervision and assistance at home is recommended    Resume home medications as prescribed by your primary care physician.    We care deeply about how our patients' pain is managed after surgery.  During the month after you have surgery, you may receive a call from [place] asking about your postsurgical pain and about how you managed that pain.  The survey will take approximately 5 to 10 minutes and will help [place] to provide better care to our patients.  Thank you in advance  for your participation and please answer any calls coming from the [facility] or [place] area!          Patient should avoid driving or operating machinery, activities that require balance or risk of fall, making important decisions or signing legal paperwork. Effects may last for several hours, supervision and assistance at home is recommended.

## 2020-12-16 MED ADMIN — HYDROmorphone (PF) (DILAUDID) injection 0.5 mg: .5 mg | INTRAVENOUS | @ 05:00:00 | Stop: 2020-12-29

## 2020-12-16 MED ADMIN — heparin (porcine) 5,000 unit/mL injection 5,000 Units: 5000 [IU] | SUBCUTANEOUS | @ 02:00:00

## 2020-12-16 MED ADMIN — gabapentin (NEURONTIN) capsule 300 mg: 300 mg | ORAL | @ 02:00:00

## 2020-12-16 MED ADMIN — spironolactone (ALDACTONE) tablet 100 mg: 100 mg | ORAL | @ 16:00:00 | Stop: 2020-12-16

## 2020-12-16 MED ADMIN — amLODIPine (NORVASC) tablet 10 mg: 10 mg | ORAL | @ 15:00:00 | Stop: 2020-12-16

## 2020-12-16 MED ADMIN — oxyCODONE (ROXICODONE) immediate release tablet 5 mg: 5 mg | ORAL | @ 16:00:00 | Stop: 2020-12-16

## 2020-12-16 MED ADMIN — oxyCODONE (ROXICODONE) immediate release tablet 5 mg: 5 mg | ORAL | @ 08:00:00 | Stop: 2020-12-16

## 2020-12-16 MED ADMIN — cyclobenzaprine (FLEXERIL) tablet 10 mg: 10 mg | ORAL | @ 02:00:00

## 2020-12-16 MED ADMIN — sertraline (ZOLOFT) tablet 75 mg: 75 mg | ORAL | @ 15:00:00 | Stop: 2020-12-16

## 2020-12-16 MED ADMIN — hydrOXYzine (ATARAX) tablet 25 mg: 25 mg | ORAL | @ 15:00:00 | Stop: 2020-12-16

## 2020-12-16 MED ADMIN — pantoprazole (PROTONIX) EC tablet 20 mg: 20 mg | ORAL | @ 15:00:00 | Stop: 2020-12-16

## 2020-12-16 MED ADMIN — metFORMIN (GLUCOPHAGE) tablet 1,000 mg: 1000 mg | ORAL | @ 15:00:00 | Stop: 2020-12-16

## 2020-12-16 MED ADMIN — clindamycin (CLEOCIN) 600 mg/50 mL IVPB 600 mg: 600 mg | INTRAVENOUS | @ 15:00:00 | Stop: 2020-12-16

## 2020-12-16 MED ADMIN — HYDROmorphone (PF) (DILAUDID) injection 0.5 mg: .5 mg | INTRAVENOUS | Stop: 2020-12-29

## 2020-12-16 MED ADMIN — ferrous sulfate tablet 325 mg: 325 mg | ORAL | @ 15:00:00 | Stop: 2020-12-16

## 2020-12-16 MED ADMIN — melatonin tablet 3 mg: 3 mg | ORAL | @ 05:00:00

## 2020-12-16 MED ADMIN — insulin glargine (LANTUS) injection 44 Units: 44 [IU] | SUBCUTANEOUS | @ 03:00:00

## 2020-12-16 MED ADMIN — oxyCODONE (ROXICODONE) immediate release tablet 5 mg: 5 mg | ORAL | @ 12:00:00 | Stop: 2020-12-16

## 2020-12-16 MED ADMIN — promethazine (PHENERGAN) tablet 25 mg: 25 mg | ORAL | @ 17:00:00 | Stop: 2020-12-16

## 2020-12-16 MED ADMIN — metoprolol succinate (TOPROL-XL) 24 hr tablet 100 mg: 100 mg | ORAL | @ 15:00:00 | Stop: 2020-12-16

## 2020-12-16 MED ADMIN — HYDROmorphone (PF) (DILAUDID) injection 0.5 mg: .5 mg | INTRAVENOUS | @ 17:00:00 | Stop: 2020-12-16

## 2020-12-16 MED ADMIN — heparin (porcine) 5,000 unit/mL injection 5,000 Units: 5000 [IU] | SUBCUTANEOUS | @ 15:00:00 | Stop: 2020-12-16

## 2020-12-16 MED ADMIN — norethindrone (MICRONOR) tablet 1 tablet: 1 | ORAL | @ 15:00:00 | Stop: 2020-12-16

## 2020-12-16 MED ADMIN — clindamycin (CLEOCIN) 600 mg/50 mL IVPB 600 mg: 600 mg | INTRAVENOUS | @ 04:00:00 | Stop: 2020-12-16

## 2020-12-16 MED ADMIN — insulin lispro (HumaLOG) injection 0-20 Units: 0-20 [IU] | SUBCUTANEOUS | @ 02:00:00

## 2020-12-16 MED ADMIN — losartan (COZAAR) tablet 25 mg: 25 mg | ORAL | @ 15:00:00 | Stop: 2020-12-16

## 2020-12-16 MED ADMIN — oxyCODONE (ROXICODONE) immediate release tablet 5 mg: 5 mg | ORAL | @ 03:00:00 | Stop: 2020-12-29

## 2020-12-16 MED ADMIN — insulin lispro (HumaLOG) injection 6 Units: 6 [IU] | SUBCUTANEOUS | @ 16:00:00 | Stop: 2020-12-16

## 2020-12-16 MED ADMIN — aspirin chewable tablet 81 mg: 81 mg | ORAL | @ 02:00:00

## 2020-12-16 MED ADMIN — gabapentin (NEURONTIN) capsule 300 mg: 300 mg | ORAL | @ 15:00:00 | Stop: 2020-12-16

## 2020-12-16 NOTE — Unmapped (Signed)
Overnight Stay:  No need to make post op call as patient stayed overnight in the hospital.

## 2020-12-16 NOTE — Unmapped (Signed)
Pt ready for discharge. AVS handout reviewed with and given to pt. Pt verbalized understanding and had no additional questions. Rx picked up at pharmacy. Pt escorted out via wheelchair.      Problem: Adult Inpatient Plan of Care  Goal: Plan of Care Review  Outcome: Progressing  Goal: Patient-Specific Goal (Individualized)  Outcome: Progressing  Goal: Absence of Hospital-Acquired Illness or Injury  Outcome: Progressing  Intervention: Identify and Manage Fall Risk  Recent Flowsheet Documentation  Taken 12/16/2020 1217 by Johnn Hai, RN  Safety Interventions:   low bed   fall reduction program maintained   family at bedside  Taken 12/16/2020 1000 by Johnn Hai, RN  Safety Interventions:   low bed   family at bedside   fall reduction program maintained  Taken 12/16/2020 0824 by Johnn Hai, RN  Safety Interventions:   low bed   fall reduction program maintained   family at bedside  Intervention: Prevent and Manage VTE (Venous Thromboembolism) Risk  Recent Flowsheet Documentation  Taken 12/16/2020 1217 by Johnn Hai, RN  Activity Management: activity adjusted per tolerance  Taken 12/16/2020 1000 by Johnn Hai, RN  Activity Management: activity adjusted per tolerance  Taken 12/16/2020 0824 by Johnn Hai, RN  Activity Management: activity adjusted per tolerance  Goal: Optimal Comfort and Wellbeing  Outcome: Progressing  Goal: Readiness for Transition of Care  Outcome: Progressing  Goal: Rounds/Family Conference  Outcome: Progressing     Problem: Pain Acute  Goal: Acceptable Pain Control and Functional Ability  Outcome: Progressing     Problem: Fall Injury Risk  Goal: Absence of Fall and Fall-Related Injury  Outcome: Progressing  Intervention: Promote Injury-Free Environment  Recent Flowsheet Documentation  Taken 12/16/2020 1217 by Johnn Hai, RN  Safety Interventions:   low bed   fall reduction program maintained   family at bedside  Taken 12/16/2020 1000 by Johnn Hai, RN  Safety Interventions: low bed   family at bedside   fall reduction program maintained  Taken 12/16/2020 0824 by Johnn Hai, RN  Safety Interventions:   low bed   fall reduction program maintained   family at bedside     Problem: Bleeding (Surgery Nonspecified)  Goal: Absence of Bleeding  Outcome: Progressing     Problem: Bowel Motility Impaired (Surgery Nonspecified)  Goal: Effective Bowel Elimination  Outcome: Progressing     Problem: Fluid and Electrolyte Imbalance (Surgery Nonspecified)  Goal: Fluid and Electrolyte Balance  Outcome: Progressing     Problem: Glycemic Control Impaired (Surgery Nonspecified)  Goal: Blood Glucose Level Within Targeted Range  Outcome: Progressing     Problem: Risk for Infection (Surgery Nonspecified)  Goal: Absence of Infection Signs and Symptoms  Outcome: Progressing     Problem: Ongoing Anesthesia Effects (Surgery Nonspecified)  Goal: Anesthesia/Sedation Recovery  Outcome: Progressing  Intervention: Optimize Anesthesia Recovery  Recent Flowsheet Documentation  Taken 12/16/2020 1217 by Johnn Hai, RN  Safety Interventions:   low bed   fall reduction program maintained   family at bedside  Taken 12/16/2020 1000 by Johnn Hai, RN  Safety Interventions:   low bed   family at bedside   fall reduction program maintained  Taken 12/16/2020 0824 by Johnn Hai, RN  Safety Interventions:   low bed   fall reduction program maintained   family at bedside     Problem: Pain (Surgery Nonspecified)  Goal: Acceptable Pain Control  Outcome: Progressing     Problem: Postoperative Nausea and Vomiting (Surgery Nonspecified)  Goal: Nausea and Vomiting Relief  Outcome: Progressing  Problem: Postoperative Urinary Retention (Surgery Nonspecified)  Goal: Effective Urinary Elimination  Outcome: Progressing     Problem: Respiratory Compromise (Surgery Nonspecified)  Goal: Effective Oxygenation and Ventilation  Outcome: Progressing  Intervention: Optimize Oxygenation and Ventilation  Recent Flowsheet Documentation  Taken 12/16/2020 1000 by Johnn Hai, RN  Head of Bed [facility]) Positioning: HOB at 30-45 degrees  Taken 12/16/2020 0824 by Johnn Hai, RN  Head of Bed [facility]) Positioning: HOB at 30-45 degrees     Problem: Impaired Wound Healing  Goal: Optimal Wound Healing  Outcome: Progressing  Intervention: Promote Wound Healing  Recent Flowsheet Documentation  Taken 12/16/2020 1217 by Johnn Hai, RN  Activity Management: activity adjusted per tolerance  Taken 12/16/2020 1000 by Johnn Hai, RN  Activity Management: activity adjusted per tolerance  Taken 12/16/2020 0824 by Johnn Hai, RN  Activity Management: activity adjusted per tolerance     Problem: Self-Care Deficit  Goal: Improved Ability to Complete Activities of Daily Living  Outcome: Progressing

## 2020-12-16 NOTE — Unmapped (Signed)
Pt reports that pain management is inadequate. Paged Dr. Marthe Patch regarding 10/10 pain. Dilaudid given per new order. Pt also refused dinner insulin and metformin due to uncontrolled pain. RN tried to get pt to take medications multiple times. Dr. Marthe Patch notified about refusal.     Tele in place, no events reported. Pt tolerating PO intake and ambulated to bathroom multiple time, UOP sufficient.

## 2020-12-16 NOTE — Unmapped (Signed)
Patient A and oriented x 4 post op POD 1 vs stable no Fever  Pain is moderately  being control by Po and IV opoid for break thruogh pain. With pain level 4-7 on a pain scale of 0 - 10  sllght bleeding noted on sanitary  pad  Assisted with bath  Dressing to vulva incision slightly stained no active bleeding noted   Pt wanted to take shower ,was not allowed instead assisted with bed  Bath. What to know if MD will let her take shower. No acute distresssnoted overnight IV NS at  Left FA PIV  Patent and intact.

## 2020-12-16 NOTE — Unmapped (Signed)
Case Management Brief Assessment      General:  Care Manager assessed the patient by : Medical record review, Discussion with Clinical Care team    Extended Emergency Contact Information  Primary Emergency Contact: Edwards,Marcia  Address: [address]           [place], [place] [address] [place]  Home Phone: [phone]  Work Phone: [phone]  Mobile Phone: [phone]  Relation: Mother  Interpreter needed? No  Secondary Emergency Contact: Edwards,Bianca  Address: [address]           [place], [place] [address] [place]  Home Phone: [phone]  Mobile Phone: [phone]  Relation: Relative      Financial Information:  Need for financial assistance?: No         Discharge Needs:  Concerns to be Addressed: no discharge needs identified    Clinical risk factors: New Diagnosis      Discharge Plan:  Screen findings are: Care Manager reviewed the plan of the patient's care with the Multidisciplinary Team. No discharge planning needs identified at this time. Care Manager will continue to manage plan and monitor patient's progress with the team.    Estimated Discharge Date: 12/16/2020    Initial Assessment complete?: Yes        Additional Information:    HCDM (patient stated preference): Tanya Nones - Mother - [phone]    Social Determinants of Health     Food Insecurity: No Food Insecurity   ??? Worried About Programme researcher, broadcasting/film/video in the Last Year: Never true   ??? Ran Out of Food in the Last Year: Never true   Tobacco Use: High Risk   ??? Smoking Tobacco Use: Some Days   ??? Smokeless Tobacco Use: Never   ??? Passive Exposure: Not on file   Transportation Needs: No Transportation Needs   ??? Lack of Transportation (Medical): No   ??? Lack of Transportation (Non-Medical): No   Alcohol Use: Not At Risk   ??? How often do you have a drink containing alcohol?: Monthly or less   ??? How many drinks containing alcohol do you have on a typical day when you are drinking?: 1 - 2   ??? How often do you have 5 or more drinks on one occasion?: Less than monthy   Housing/Utilities: Low Risk    ??? Within the past 12 months, have you ever stayed: outside, in a car, in a tent, in an overnight shelter, or temporarily in someone else's home (i.e. couch-surfing)?: No   ??? Are you worried about losing your housing?: No   ??? Within the past 12 months, have you been unable to get utilities (heat, electricity) when it was really needed?: No   Substance Use: Low Risk    ??? Taken prescription drugs for non-medical reasons: Never   ??? Taken illegal drugs: Never   ??? Patient indicated they have taken drugs in the past year for non-medical reasons: Yes, [positive answer(s)]: Not on file   Financial Resource Strain: Low Risk    ??? Difficulty of Paying Living Expenses: Not hard at all   Physical Activity: Sufficiently Active   ??? Days of Exercise per Week: 3 days   ??? Minutes of Exercise per Session: 60 min   Health Literacy: Low Risk    ??? : Never   Stress: Stress Concern Present   ??? Feeling of Stress : To some extent   Intimate Partner Violence: Not At Risk   ???  Fear of Current or Ex-Partner: No   ??? Emotionally Abused: No   ??? Physically Abused: No   ??? Sexually Abused: No   Depression: Not at risk   ??? PHQ-2 Score: 2   Social Connections: Socially Isolated   ??? Frequency of Communication with Friends and Family: More than three times a week   ??? Frequency of Social Gatherings with Friends and Family: Not on file   ??? Attends Religious Services: Never   ??? Active Member of Clubs or Organizations: No   ??? Attends Banker Meetings: Never   ??? Marital Status: Never married       Predictive Model Details   No score data available for [facility] Risk of Unplanned Readmission      Minimal discharge needs have been identified for this patient.  Anticipated needs are: None  CM will continue to follow for avoidable delays and opportunities for progression of care.   12/16/2020 10:18 AM

## 2020-12-16 NOTE — Unmapped (Signed)
Problem: Adult Inpatient Plan of Care  Goal: Plan of Care Review  Outcome: Progressing  Goal: Patient-Specific Goal (Individualized)  Outcome: Progressing  Goal: Absence of Hospital-Acquired Illness or Injury  Outcome: Progressing  Intervention: Identify and Manage Fall Risk  Recent Flowsheet Documentation  Taken 12/15/2020 2005 by Azzie Roup, RN  Safety Interventions:   low bed   fall reduction program maintained  Intervention: Prevent Skin Injury  Recent Flowsheet Documentation  Taken 12/15/2020 2005 by Azzie Roup, RN  Skin Protection: adhesive use limited  Intervention: Prevent and Manage VTE (Venous Thromboembolism) Risk  Recent Flowsheet Documentation  Taken 12/15/2020 2005 by Azzie Roup, RN  Activity Management: activity adjusted per tolerance  Range of Motion:   LUE   Bilateral Upper and Lower Extremities  Goal: Optimal Comfort and Wellbeing  Outcome: Progressing  Goal: Readiness for Transition of Care  Outcome: Progressing  Goal: Rounds/Family Conference  Outcome: Progressing     Problem: Adult Inpatient Plan of Care  Goal: Plan of Care Review  Outcome: Progressing  Goal: Patient-Specific Goal (Individualized)  Outcome: Progressing  Goal: Absence of Hospital-Acquired Illness or Injury  Outcome: Progressing  Intervention: Identify and Manage Fall Risk  Recent Flowsheet Documentation  Taken 12/15/2020 2005 by Azzie Roup, RN  Safety Interventions:   low bed   fall reduction program maintained  Intervention: Prevent Skin Injury  Recent Flowsheet Documentation  Taken 12/15/2020 2005 by Azzie Roup, RN  Skin Protection: adhesive use limited  Intervention: Prevent and Manage VTE (Venous Thromboembolism) Risk  Recent Flowsheet Documentation  Taken 12/15/2020 2005 by Azzie Roup, RN  Activity Management: activity adjusted per tolerance  Range of Motion:   LUE   Bilateral Upper and Lower Extremities  Goal: Optimal Comfort and Wellbeing  Outcome: Progressing  Goal: Readiness for Transition of Care  Outcome: Progressing  Goal: Rounds/Family Conference  Outcome: Progressing     Problem: Adult Inpatient Plan of Care  Goal: Plan of Care Review  Outcome: Progressing  Goal: Patient-Specific Goal (Individualized)  Outcome: Progressing  Goal: Absence of Hospital-Acquired Illness or Injury  Outcome: Progressing  Intervention: Identify and Manage Fall Risk  Recent Flowsheet Documentation  Taken 12/15/2020 2005 by Azzie Roup, RN  Safety Interventions:   low bed   fall reduction program maintained  Intervention: Prevent Skin Injury  Recent Flowsheet Documentation  Taken 12/15/2020 2005 by Azzie Roup, RN  Skin Protection: adhesive use limited  Intervention: Prevent and Manage VTE (Venous Thromboembolism) Risk  Recent Flowsheet Documentation  Taken 12/15/2020 2005 by Azzie Roup, RN  Activity Management: activity adjusted per tolerance  Range of Motion:   LUE   Bilateral Upper and Lower Extremities  Goal: Optimal Comfort and Wellbeing  Outcome: Progressing  Goal: Readiness for Transition of Care  Outcome: Progressing  Goal: Rounds/Family Conference  Outcome: Progressing     Problem: Adult Inpatient Plan of Care  Goal: Plan of Care Review  Outcome: Progressing

## 2020-12-21 NOTE — Unmapped (Signed)
I saw and evaluated the patient, participating in the key portions of the service.  I reviewed the resident’s note.  I agree with the resident’s findings and plan. Quinn Bartling B Toddy Boyd, MD

## 2020-12-21 NOTE — Unmapped (Signed)
Specialty Medication(s): Repatha    Ms.Bastarache has been dis-enrolled from the [facility] specialty pharmacy services due to multiple unsuccessful outreach attempts by the pharmacy.    Additional information provided to the patient: n/a    Camillo Flaming  [facility] Specialty Pharmacist

## 2020-12-25 ENCOUNTER — Ambulatory Visit: Admit: 2020-12-25 | Payer: PRIVATE HEALTH INSURANCE

## 2020-12-27 ENCOUNTER — Ambulatory Visit: Admit: 2020-12-27 | Discharge: 2020-12-28 | Payer: PRIVATE HEALTH INSURANCE | Attending: Medical | Primary: Medical

## 2020-12-27 DIAGNOSIS — L732 Hidradenitis suppurativa: Principal | ICD-10-CM

## 2020-12-27 MED ORDER — OXYCODONE-ACETAMINOPHEN 5 MG-325 MG TABLET
ORAL_TABLET | ORAL | 0 refills | 3.00000 days | Status: CP | PRN
Start: 2020-12-27 — End: 2020-12-27

## 2020-12-28 MED ORDER — GABAPENTIN 300 MG CAPSULE
ORAL_CAPSULE | 1 refills | 0 days
Start: 2020-12-28 — End: ?

## 2020-12-29 MED ORDER — ASPIRIN 81 MG CHEWABLE TABLET
ORAL_TABLET | Freq: Every evening | ORAL | 0 refills | 30 days
Start: 2020-12-29 — End: 2021-01-28

## 2020-12-29 MED ORDER — GABAPENTIN 300 MG CAPSULE
ORAL_CAPSULE | 1 refills | 0 days | Status: CP
Start: 2020-12-29 — End: ?

## 2021-01-02 MED ORDER — ASPIRIN 81 MG CHEWABLE TABLET
ORAL_TABLET | Freq: Every day | ORAL | 11 refills | 30 days | Status: CP
Start: 2021-01-02 — End: ?

## 2021-01-10 ENCOUNTER — Institutional Professional Consult (permissible substitution): Admit: 2021-01-10 | Discharge: 2021-01-11 | Payer: PRIVATE HEALTH INSURANCE

## 2021-01-17 MED ORDER — CYCLOBENZAPRINE 10 MG TABLET
ORAL_TABLET | 1 refills | 0 days | Status: CP
Start: 2021-01-17 — End: ?

## 2021-01-17 MED ORDER — PROMETHAZINE 25 MG TABLET
ORAL_TABLET | 3 refills | 0 days | Status: CP
Start: 2021-01-17 — End: ?

## 2021-01-19 ENCOUNTER — Ambulatory Visit
Admit: 2021-01-19 | Discharge: 2021-01-20 | Payer: PRIVATE HEALTH INSURANCE | Attending: Student in an Organized Health Care Education/Training Program | Primary: Student in an Organized Health Care Education/Training Program

## 2021-01-19 DIAGNOSIS — E1165 Type 2 diabetes mellitus with hyperglycemia: Principal | ICD-10-CM

## 2021-01-19 DIAGNOSIS — Z794 Long term (current) use of insulin: Principal | ICD-10-CM

## 2021-01-24 ENCOUNTER — Ambulatory Visit: Admit: 2021-01-24 | Discharge: 2021-01-25 | Payer: PRIVATE HEALTH INSURANCE | Attending: Medical | Primary: Medical

## 2021-01-26 ENCOUNTER — Institutional Professional Consult (permissible substitution): Admit: 2021-01-26 | Discharge: 2021-01-27 | Payer: PRIVATE HEALTH INSURANCE

## 2021-02-01 MED ORDER — AMLODIPINE 10 MG TABLET
ORAL_TABLET | 3 refills | 0 days
Start: 2021-02-01 — End: ?

## 2021-02-02 DIAGNOSIS — E1169 Type 2 diabetes mellitus with other specified complication: Principal | ICD-10-CM

## 2021-02-02 DIAGNOSIS — Z794 Long term (current) use of insulin: Principal | ICD-10-CM

## 2021-02-02 MED ORDER — AMLODIPINE 10 MG TABLET
ORAL_TABLET | 3 refills | 0 days | Status: CP
Start: 2021-02-02 — End: ?

## 2021-02-02 MED ORDER — INSULIN ASPART (U-100) 100 UNIT/ML (3 ML) SUBCUTANEOUS PEN
Freq: Three times a day (TID) | SUBCUTANEOUS | 12 refills | 84.00000 days | Status: CP
Start: 2021-02-02 — End: ?

## 2021-02-06 DIAGNOSIS — E1169 Type 2 diabetes mellitus with other specified complication: Principal | ICD-10-CM

## 2021-02-06 DIAGNOSIS — Z794 Long term (current) use of insulin: Principal | ICD-10-CM

## 2021-02-06 MED ORDER — INSULIN ASPART (U-100) 100 UNIT/ML (3 ML) SUBCUTANEOUS PEN
12 refills | 0.00000 days | Status: CP
Start: 2021-02-06 — End: ?

## 2021-02-12 ENCOUNTER — Ambulatory Visit: Admit: 2021-02-12 | Discharge: 2021-02-13 | Payer: PRIVATE HEALTH INSURANCE

## 2021-02-12 ENCOUNTER — Institutional Professional Consult (permissible substitution): Admit: 2021-02-12 | Discharge: 2021-02-13 | Payer: PRIVATE HEALTH INSURANCE

## 2021-02-13 ENCOUNTER — Ambulatory Visit: Admit: 2021-02-13 | Discharge: 2021-02-14 | Payer: PRIVATE HEALTH INSURANCE

## 2021-02-15 DIAGNOSIS — I739 Peripheral vascular disease, unspecified: Principal | ICD-10-CM

## 2021-02-20 DIAGNOSIS — N921 Excessive and frequent menstruation with irregular cycle: Principal | ICD-10-CM

## 2021-03-01 ENCOUNTER — Ambulatory Visit
Admit: 2021-03-01 | Discharge: 2021-03-02 | Payer: PRIVATE HEALTH INSURANCE | Attending: "Women's Health Care | Primary: "Women's Health Care

## 2021-03-01 DIAGNOSIS — N939 Abnormal uterine and vaginal bleeding, unspecified: Principal | ICD-10-CM

## 2021-03-01 DIAGNOSIS — R030 Elevated blood-pressure reading, without diagnosis of hypertension: Principal | ICD-10-CM

## 2021-03-01 DIAGNOSIS — E049 Nontoxic goiter, unspecified: Principal | ICD-10-CM

## 2021-03-12 ENCOUNTER — Ambulatory Visit: Admit: 2021-03-12 | Discharge: 2021-03-13 | Payer: PRIVATE HEALTH INSURANCE

## 2021-03-12 DIAGNOSIS — F331 Major depressive disorder, recurrent, moderate: Principal | ICD-10-CM

## 2021-03-12 DIAGNOSIS — J4520 Mild intermittent asthma, uncomplicated: Principal | ICD-10-CM

## 2021-03-12 DIAGNOSIS — I10 Essential (primary) hypertension: Principal | ICD-10-CM

## 2021-03-12 DIAGNOSIS — E109 Type 1 diabetes mellitus without complications: Principal | ICD-10-CM

## 2021-03-12 DIAGNOSIS — E7800 Pure hypercholesterolemia, unspecified: Principal | ICD-10-CM

## 2021-03-12 DIAGNOSIS — N921 Excessive and frequent menstruation with irregular cycle: Principal | ICD-10-CM

## 2021-03-12 DIAGNOSIS — G63 Polyneuropathy in diseases classified elsewhere: Principal | ICD-10-CM

## 2021-03-12 DIAGNOSIS — E1143 Type 2 diabetes mellitus with diabetic autonomic (poly)neuropathy: Principal | ICD-10-CM

## 2021-03-12 DIAGNOSIS — K3184 Gastroparesis: Principal | ICD-10-CM

## 2021-03-12 DIAGNOSIS — I2111 ST elevation (STEMI) myocardial infarction involving right coronary artery: Principal | ICD-10-CM

## 2021-03-12 MED ORDER — ATORVASTATIN 80 MG TABLET
ORAL_TABLET | Freq: Every day | ORAL | 3 refills | 90 days | Status: CP
Start: 2021-03-12 — End: 2022-04-11

## 2021-03-12 MED ORDER — METOPROLOL SUCCINATE ER 50 MG TABLET,EXTENDED RELEASE 24 HR
ORAL_TABLET | Freq: Every day | ORAL | 3 refills | 90 days | Status: CP
Start: 2021-03-12 — End: 2022-03-12

## 2021-03-12 MED ORDER — NORETHINDRONE (CONTRACEPTIVE) 0.35 MG TABLET
ORAL_TABLET | Freq: Every day | ORAL | 3 refills | 84 days | Status: CP
Start: 2021-03-12 — End: ?

## 2021-03-12 MED ORDER — SERTRALINE 50 MG TABLET
ORAL_TABLET | Freq: Every day | ORAL | 3 refills | 90 days | Status: CP
Start: 2021-03-12 — End: 2022-03-12

## 2021-03-15 ENCOUNTER — Institutional Professional Consult (permissible substitution): Admit: 2021-03-15 | Discharge: 2021-03-16 | Payer: PRIVATE HEALTH INSURANCE

## 2021-03-15 DIAGNOSIS — Z4509 Encounter for adjustment and management of other cardiac device: Principal | ICD-10-CM

## 2021-03-16 ENCOUNTER — Ambulatory Visit
Admit: 2021-03-16 | Discharge: 2021-03-17 | Payer: PRIVATE HEALTH INSURANCE | Attending: Rehabilitative and Restorative Service Providers" | Primary: Rehabilitative and Restorative Service Providers"

## 2021-03-20 DIAGNOSIS — G4700 Insomnia, unspecified: Principal | ICD-10-CM

## 2021-03-20 MED ORDER — BELSOMRA 10 MG TABLET
ORAL_TABLET | 1 refills | 0 days | Status: CP
Start: 2021-03-20 — End: ?

## 2021-04-16 MED ORDER — FUROSEMIDE 40 MG TABLET
ORAL_TABLET | Freq: Every evening | ORAL | 0 refills | 90 days | Status: CP
Start: 2021-04-16 — End: ?

## 2021-04-19 ENCOUNTER — Ambulatory Visit: Admit: 2021-04-19 | Payer: PRIVATE HEALTH INSURANCE

## 2021-04-20 ENCOUNTER — Emergency Department: Payer: PRIVATE HEALTH INSURANCE

## 2021-04-20 ENCOUNTER — Ambulatory Visit: Payer: PRIVATE HEALTH INSURANCE

## 2021-04-20 DIAGNOSIS — S82831A Other fracture of upper and lower end of right fibula, initial encounter for closed fracture: Principal | ICD-10-CM

## 2021-04-23 ENCOUNTER — Ambulatory Visit: Admit: 2021-04-23 | Discharge: 2021-04-23 | Payer: PRIVATE HEALTH INSURANCE

## 2021-04-25 ENCOUNTER — Ambulatory Visit: Admit: 2021-04-25 | Discharge: 2021-04-26 | Payer: PRIVATE HEALTH INSURANCE

## 2021-04-25 MED ORDER — OXYCODONE 5 MG TABLET
ORAL_TABLET | Freq: Four times a day (QID) | ORAL | 0 refills | 5 days | Status: CP | PRN
Start: 2021-04-25 — End: ?

## 2021-04-26 ENCOUNTER — Institutional Professional Consult (permissible substitution): Admit: 2021-04-26 | Discharge: 2021-04-27 | Payer: PRIVATE HEALTH INSURANCE

## 2021-05-03 DIAGNOSIS — S82831A Other fracture of upper and lower end of right fibula, initial encounter for closed fracture: Principal | ICD-10-CM

## 2021-05-03 MED ORDER — OXYCODONE 5 MG TABLET
ORAL_TABLET | Freq: Three times a day (TID) | ORAL | 0 refills | 5 days | Status: CP | PRN
Start: 2021-05-03 — End: ?

## 2021-05-07 ENCOUNTER — Ambulatory Visit: Admit: 2021-05-07 | Discharge: 2021-05-08 | Payer: PRIVATE HEALTH INSURANCE

## 2021-05-07 DIAGNOSIS — G63 Polyneuropathy in diseases classified elsewhere: Principal | ICD-10-CM

## 2021-05-07 DIAGNOSIS — E109 Type 1 diabetes mellitus without complications: Principal | ICD-10-CM

## 2021-05-07 DIAGNOSIS — S82831D Other fracture of upper and lower end of right fibula, subsequent encounter for closed fracture with routine healing: Principal | ICD-10-CM

## 2021-05-07 DIAGNOSIS — E7800 Pure hypercholesterolemia, unspecified: Principal | ICD-10-CM

## 2021-05-07 DIAGNOSIS — I10 Essential (primary) hypertension: Principal | ICD-10-CM

## 2021-05-10 DIAGNOSIS — E1165 Type 2 diabetes mellitus with hyperglycemia: Principal | ICD-10-CM

## 2021-05-10 DIAGNOSIS — Z794 Long term (current) use of insulin: Principal | ICD-10-CM

## 2021-05-10 DIAGNOSIS — H579 Unspecified disorder of eye and adnexa: Principal | ICD-10-CM

## 2021-05-14 ENCOUNTER — Ambulatory Visit: Admit: 2021-05-14 | Discharge: 2021-05-15 | Payer: PRIVATE HEALTH INSURANCE

## 2021-05-14 MED ORDER — OXYCODONE 5 MG TABLET
ORAL_TABLET | Freq: Three times a day (TID) | ORAL | 0 refills | 3 days | Status: CP | PRN
Start: 2021-05-14 — End: ?

## 2021-05-17 ENCOUNTER — Ambulatory Visit: Admit: 2021-05-17 | Discharge: 2021-05-18 | Payer: PRIVATE HEALTH INSURANCE

## 2021-05-17 MED ORDER — FUROSEMIDE 40 MG TABLET
ORAL_TABLET | Freq: Every evening | ORAL | 1 refills | 90 days | Status: CP
Start: 2021-05-17 — End: ?

## 2021-05-17 MED ORDER — CYCLOBENZAPRINE 10 MG TABLET
ORAL_TABLET | Freq: Two times a day (BID) | ORAL | 1 refills | 90 days | Status: CP | PRN
Start: 2021-05-17 — End: ?

## 2021-05-18 MED ORDER — LOSARTAN 50 MG TABLET
ORAL_TABLET | Freq: Every day | ORAL | 3 refills | 90 days | Status: CP
Start: 2021-05-18 — End: 2022-05-18

## 2021-05-29 ENCOUNTER — Institutional Professional Consult (permissible substitution): Admit: 2021-05-29 | Discharge: 2021-05-30 | Payer: PRIVATE HEALTH INSURANCE

## 2021-05-29 ENCOUNTER — Ambulatory Visit
Admit: 2021-05-29 | Discharge: 2021-05-30 | Payer: PRIVATE HEALTH INSURANCE | Attending: Student in an Organized Health Care Education/Training Program | Primary: Student in an Organized Health Care Education/Training Program

## 2021-05-29 DIAGNOSIS — E785 Hyperlipidemia, unspecified: Principal | ICD-10-CM

## 2021-05-29 DIAGNOSIS — I2111 ST elevation (STEMI) myocardial infarction involving right coronary artery: Principal | ICD-10-CM

## 2021-05-29 DIAGNOSIS — Z4509 Encounter for adjustment and management of other cardiac device: Principal | ICD-10-CM

## 2021-05-29 DIAGNOSIS — E1165 Type 2 diabetes mellitus with hyperglycemia: Principal | ICD-10-CM

## 2021-05-29 DIAGNOSIS — Z794 Long term (current) use of insulin: Principal | ICD-10-CM

## 2021-05-29 MED ORDER — DEXCOM G6 SENSOR DEVICE
11 refills | 0 days | Status: CP
Start: 2021-05-29 — End: ?

## 2021-05-29 MED ORDER — METFORMIN ER 500 MG TABLET,EXTENDED RELEASE 24 HR
ORAL_TABLET | Freq: Two times a day (BID) | ORAL | 3 refills | 90 days | Status: CP
Start: 2021-05-29 — End: 2022-05-24

## 2021-05-29 MED ORDER — DEXCOM G6 TRANSMITTER DEVICE
1 refills | 0 days | Status: CP
Start: 2021-05-29 — End: ?

## 2021-06-05 ENCOUNTER — Ambulatory Visit: Admit: 2021-06-05 | Discharge: 2021-06-06 | Payer: PRIVATE HEALTH INSURANCE

## 2021-06-05 DIAGNOSIS — Z794 Long term (current) use of insulin: Principal | ICD-10-CM

## 2021-06-05 DIAGNOSIS — H579 Unspecified disorder of eye and adnexa: Principal | ICD-10-CM

## 2021-06-05 DIAGNOSIS — E1165 Type 2 diabetes mellitus with hyperglycemia: Principal | ICD-10-CM

## 2021-06-05 MED ORDER — CHOLECALCIFEROL (VITAMIN D3) 1,250 MCG (50,000 UNIT) TABLET
ORAL_TABLET | ORAL | 0 refills | 56 days | Status: CP
Start: 2021-06-05 — End: 2021-06-13

## 2021-06-07 ENCOUNTER — Ambulatory Visit
Admit: 2021-06-07 | Discharge: 2021-06-08 | Payer: PRIVATE HEALTH INSURANCE | Attending: "Women's Health Care | Primary: "Women's Health Care

## 2021-06-07 DIAGNOSIS — Z113 Encounter for screening for infections with a predominantly sexual mode of transmission: Principal | ICD-10-CM

## 2021-06-07 DIAGNOSIS — Z01419 Encounter for gynecological examination (general) (routine) without abnormal findings: Principal | ICD-10-CM

## 2021-06-07 DIAGNOSIS — Z8619 Personal history of other infectious and parasitic diseases: Principal | ICD-10-CM

## 2021-06-11 MED ORDER — METRONIDAZOLE 500 MG TABLET
ORAL_TABLET | Freq: Two times a day (BID) | ORAL | 0 refills | 7.00000 days | Status: CP
Start: 2021-06-11 — End: 2021-06-18

## 2021-06-12 ENCOUNTER — Institutional Professional Consult (permissible substitution): Admit: 2021-06-12 | Discharge: 2021-06-13 | Payer: PRIVATE HEALTH INSURANCE

## 2021-06-12 DIAGNOSIS — Z794 Long term (current) use of insulin: Principal | ICD-10-CM

## 2021-06-12 DIAGNOSIS — E1165 Type 2 diabetes mellitus with hyperglycemia: Principal | ICD-10-CM

## 2021-06-12 MED ORDER — OMNIPOD 5 G6 INTRO KIT (GEN 5) SUBCUTANEOUS CARTRIDGE WITH CONTROLLER
0 refills | 0 days | Status: CP
Start: 2021-06-12 — End: ?

## 2021-06-12 MED ORDER — CHOLECALCIFEROL (VITAMIN D3) 1,250 MCG (50,000 UNIT) TABLET
ORAL_TABLET | ORAL | 0 refills | 28 days | Status: CP
Start: 2021-06-12 — End: 2021-09-04

## 2021-06-12 MED ORDER — OMNIPOD 5 G6 PODS (GEN 5) SUBCUTANEOUS CARTRIDGE
11 refills | 0 days | Status: CP
Start: 2021-06-12 — End: ?

## 2021-06-17 MED ORDER — PROMETHAZINE 25 MG TABLET
ORAL_TABLET | 3 refills | 0 days
Start: 2021-06-17 — End: ?

## 2021-06-17 MED ORDER — METOCLOPRAMIDE 10 MG TABLET
ORAL_TABLET | 3 refills | 0 days
Start: 2021-06-17 — End: ?

## 2021-06-17 MED ORDER — ONDANSETRON 4 MG DISINTEGRATING TABLET
ORAL_TABLET | 3 refills | 0 days
Start: 2021-06-17 — End: ?

## 2021-06-18 MED ORDER — METOCLOPRAMIDE 10 MG TABLET
ORAL_TABLET | 3 refills | 0 days | Status: CP
Start: 2021-06-18 — End: ?

## 2021-06-18 MED ORDER — PROMETHAZINE 25 MG TABLET
ORAL_TABLET | 3 refills | 0 days | Status: CP
Start: 2021-06-18 — End: ?

## 2021-06-18 MED ORDER — ONDANSETRON 4 MG DISINTEGRATING TABLET
ORAL_TABLET | 3 refills | 0 days | Status: CP
Start: 2021-06-18 — End: ?

## 2021-06-20 DIAGNOSIS — G63 Polyneuropathy in diseases classified elsewhere: Principal | ICD-10-CM

## 2021-06-20 DIAGNOSIS — R531 Weakness: Principal | ICD-10-CM

## 2021-07-03 ENCOUNTER — Ambulatory Visit: Admit: 2021-07-03 | Payer: PRIVATE HEALTH INSURANCE

## 2021-07-09 MED ORDER — GABAPENTIN 300 MG CAPSULE
ORAL_CAPSULE | 1 refills | 0 days
Start: 2021-07-09 — End: ?

## 2021-07-10 MED ORDER — GABAPENTIN 300 MG CAPSULE
ORAL_CAPSULE | 1 refills | 0 days | Status: CP
Start: 2021-07-10 — End: ?

## 2021-07-11 ENCOUNTER — Ambulatory Visit
Admit: 2021-07-11 | Discharge: 2021-07-12 | Payer: PRIVATE HEALTH INSURANCE | Attending: Women's Health | Primary: Women's Health

## 2021-07-11 DIAGNOSIS — R87810 Cervical high risk human papillomavirus (HPV) DNA test positive: Principal | ICD-10-CM

## 2021-07-11 DIAGNOSIS — Z8619 Personal history of other infectious and parasitic diseases: Principal | ICD-10-CM

## 2021-07-11 DIAGNOSIS — Z23 Encounter for immunization: Principal | ICD-10-CM

## 2021-07-18 ENCOUNTER — Ambulatory Visit: Admit: 2021-07-18 | Discharge: 2021-07-19 | Payer: PRIVATE HEALTH INSURANCE

## 2021-07-18 MED ORDER — CEPHALEXIN 500 MG CAPSULE
ORAL_CAPSULE | Freq: Four times a day (QID) | ORAL | 0 refills | 10 days | Status: CP
Start: 2021-07-18 — End: 2021-07-28

## 2021-07-21 MED ORDER — BEMPEDOIC ACID 180 MG TABLET
ORAL_TABLET | Freq: Every day | ORAL | 1 refills | 0 days | Status: CP
Start: 2021-07-21 — End: ?

## 2021-08-06 ENCOUNTER — Ambulatory Visit: Admit: 2021-08-06 | Discharge: 2021-08-07 | Payer: PRIVATE HEALTH INSURANCE

## 2021-08-10 ENCOUNTER — Ambulatory Visit: Admit: 2021-08-10 | Payer: PRIVATE HEALTH INSURANCE

## 2021-08-10 ENCOUNTER — Ambulatory Visit: Admit: 2021-08-10 | Discharge: 2021-09-08 | Payer: PRIVATE HEALTH INSURANCE

## 2021-08-13 ENCOUNTER — Ambulatory Visit: Admit: 2021-08-13 | Discharge: 2021-08-14 | Payer: PRIVATE HEALTH INSURANCE

## 2021-08-13 DIAGNOSIS — E7800 Pure hypercholesterolemia, unspecified: Principal | ICD-10-CM

## 2021-08-13 DIAGNOSIS — E1165 Type 2 diabetes mellitus with hyperglycemia: Principal | ICD-10-CM

## 2021-08-13 DIAGNOSIS — Z794 Long term (current) use of insulin: Principal | ICD-10-CM

## 2021-08-13 DIAGNOSIS — I10 Essential (primary) hypertension: Principal | ICD-10-CM

## 2021-08-13 DIAGNOSIS — L97512 Non-pressure chronic ulcer of other part of right foot with fat layer exposed: Principal | ICD-10-CM

## 2021-08-13 DIAGNOSIS — S91301A Unspecified open wound, right foot, initial encounter: Principal | ICD-10-CM

## 2021-08-13 MED ORDER — CEPHALEXIN 500 MG CAPSULE
ORAL_CAPSULE | Freq: Four times a day (QID) | ORAL | 0 refills | 10 days | Status: CP
Start: 2021-08-13 — End: 2021-08-23

## 2021-08-15 ENCOUNTER — Ambulatory Visit
Admit: 2021-08-15 | Discharge: 2021-08-15 | Payer: PRIVATE HEALTH INSURANCE | Attending: Vascular Surgery | Primary: Vascular Surgery

## 2021-08-15 DIAGNOSIS — L97512 Non-pressure chronic ulcer of other part of right foot with fat layer exposed: Principal | ICD-10-CM

## 2021-08-15 MED ORDER — SILVER SULFADIAZINE 1 % TOPICAL CREAM
Freq: Every day | TOPICAL | 0 refills | 0 days | Status: CP
Start: 2021-08-15 — End: 2022-08-15

## 2021-08-15 MED ORDER — DOXYCYCLINE HYCLATE 100 MG CAPSULE
ORAL_CAPSULE | Freq: Two times a day (BID) | ORAL | 0 refills | 7 days | Status: CP
Start: 2021-08-15 — End: 2021-08-22

## 2021-08-20 ENCOUNTER — Ambulatory Visit: Admit: 2021-08-20 | Discharge: 2021-08-21 | Payer: PRIVATE HEALTH INSURANCE

## 2021-08-27 ENCOUNTER — Ambulatory Visit: Admit: 2021-08-27 | Discharge: 2021-08-28 | Payer: PRIVATE HEALTH INSURANCE

## 2021-08-29 ENCOUNTER — Ambulatory Visit: Admit: 2021-08-29 | Discharge: 2021-08-30 | Payer: PRIVATE HEALTH INSURANCE

## 2021-08-29 DIAGNOSIS — L97512 Non-pressure chronic ulcer of other part of right foot with fat layer exposed: Principal | ICD-10-CM

## 2021-08-29 DIAGNOSIS — E1165 Type 2 diabetes mellitus with hyperglycemia: Principal | ICD-10-CM

## 2021-08-29 DIAGNOSIS — Z794 Long term (current) use of insulin: Principal | ICD-10-CM

## 2021-08-31 ENCOUNTER — Institutional Professional Consult (permissible substitution): Admit: 2021-08-31 | Discharge: 2021-09-01 | Payer: PRIVATE HEALTH INSURANCE

## 2021-08-31 ENCOUNTER — Ambulatory Visit
Admit: 2021-08-31 | Discharge: 2021-09-01 | Payer: PRIVATE HEALTH INSURANCE | Attending: "Endocrinology | Primary: "Endocrinology

## 2021-08-31 DIAGNOSIS — S91301A Unspecified open wound, right foot, initial encounter: Principal | ICD-10-CM

## 2021-08-31 DIAGNOSIS — M86171 Other acute osteomyelitis, right ankle and foot: Principal | ICD-10-CM

## 2021-08-31 MED ORDER — CEPHALEXIN 500 MG CAPSULE
ORAL_CAPSULE | Freq: Four times a day (QID) | ORAL | 0 refills | 10 days | Status: CP
Start: 2021-08-31 — End: 2021-09-10

## 2021-08-31 MED ORDER — DOXYCYCLINE HYCLATE 100 MG TABLET
ORAL_TABLET | Freq: Two times a day (BID) | ORAL | 0 refills | 10 days | Status: CP
Start: 2021-08-31 — End: 2021-09-10

## 2021-09-03 DIAGNOSIS — L97512 Non-pressure chronic ulcer of other part of right foot with fat layer exposed: Principal | ICD-10-CM

## 2021-09-03 DIAGNOSIS — Z794 Long term (current) use of insulin: Principal | ICD-10-CM

## 2021-09-03 DIAGNOSIS — I739 Peripheral vascular disease, unspecified: Principal | ICD-10-CM

## 2021-09-03 DIAGNOSIS — E1165 Type 2 diabetes mellitus with hyperglycemia: Principal | ICD-10-CM

## 2021-09-03 DIAGNOSIS — M868X7 Other osteomyelitis, ankle and foot: Principal | ICD-10-CM

## 2021-09-03 DIAGNOSIS — S91301D Unspecified open wound, right foot, subsequent encounter: Principal | ICD-10-CM

## 2021-09-03 MED ORDER — PREDNISONE 50 MG TABLET
ORAL_TABLET | ORAL | 0 refills | 0 days | Status: CP
Start: 2021-09-03 — End: ?

## 2021-09-03 MED ORDER — DIPHENHYDRAMINE 50 MG CAPSULE
ORAL_CAPSULE | Freq: Once | ORAL | 0 refills | 1 days | Status: CP
Start: 2021-09-03 — End: 2021-09-03

## 2021-09-04 ENCOUNTER — Ambulatory Visit: Admit: 2021-09-04 | Discharge: 2021-09-05 | Payer: PRIVATE HEALTH INSURANCE

## 2021-09-07 ENCOUNTER — Ambulatory Visit
Admit: 2021-09-07 | Discharge: 2021-09-08 | Payer: PRIVATE HEALTH INSURANCE | Attending: Foot and Ankle Surgery | Primary: Foot and Ankle Surgery

## 2021-09-12 MED ORDER — CALCIUM CARBONATE 500 MG CALCIUM (1,250 MG) CHEWABLE TABLET
ORAL_TABLET | Freq: Every day | ORAL | 0 refills | 60.00000 days
Start: 2021-09-12 — End: 2021-11-11

## 2021-09-12 MED ORDER — ERGOCALCIFEROL (VITAMIN D2) 1,250 MCG (50,000 UNIT) CAPSULE
ORAL_CAPSULE | ORAL | 0 refills | 56.00000 days
Start: 2021-09-12 — End: 2021-11-01

## 2021-09-12 MED ORDER — IBUPROFEN 800 MG TABLET
ORAL_TABLET | Freq: Three times a day (TID) | ORAL | 0 refills | 10.00000 days | PRN
Start: 2021-09-12 — End: 2021-09-22

## 2021-09-12 MED ORDER — GABAPENTIN 100 MG CAPSULE
ORAL_CAPSULE | Freq: Three times a day (TID) | ORAL | 0 refills | 3.00000 days | Status: CN
Start: 2021-09-12 — End: 2021-09-15

## 2021-09-12 MED ORDER — ASPIRIN 81 MG CHEWABLE TABLET
ORAL_TABLET | Freq: Every day | ORAL | 0 refills | 30.00000 days
Start: 2021-09-12 — End: 2021-10-12

## 2021-09-12 MED ORDER — ASPIRIN 81 MG TABLET,DELAYED RELEASE
ORAL_TABLET | Freq: Every day | ORAL | 0 refills | 30.00000 days | Status: CN
Start: 2021-09-12 — End: 2021-10-12

## 2021-09-12 MED ORDER — OXYCODONE 5 MG TABLET
ORAL_TABLET | ORAL | 0 refills | 5.00000 days | PRN
Start: 2021-09-12 — End: 2021-09-17

## 2021-09-12 MED ORDER — ASCORBIC ACID (VITAMIN C) 500 MG TABLET
ORAL_TABLET | Freq: Every day | ORAL | 0 refills | 60.00000 days
Start: 2021-09-12 — End: 2021-11-11

## 2021-09-12 MED ORDER — DOCUSATE SODIUM 100 MG CAPSULE
ORAL_CAPSULE | Freq: Two times a day (BID) | ORAL | 0 refills | 14.00000 days
Start: 2021-09-12 — End: 2021-09-26

## 2021-09-12 MED ORDER — PROMETHAZINE 12.5 MG TABLET
ORAL_TABLET | Freq: Four times a day (QID) | ORAL | 0 refills | 5.00000 days | Status: CN | PRN
Start: 2021-09-12 — End: 2021-09-19

## 2021-09-12 MED ORDER — PROMETHAZINE 25 MG TABLET
ORAL_TABLET | Freq: Three times a day (TID) | ORAL | 0 refills | 7.00000 days | PRN
Start: 2021-09-12 — End: ?

## 2021-09-12 MED ORDER — ACETAMINOPHEN 500 MG TABLET
ORAL_TABLET | Freq: Three times a day (TID) | ORAL | 0 refills | 14.00000 days
Start: 2021-09-12 — End: 2021-09-26

## 2021-09-13 ENCOUNTER — Ambulatory Visit
Admit: 2021-09-13 | Discharge: 2021-09-20 | Disposition: A | Discharge status: Home / Self Care | Payer: PRIVATE HEALTH INSURANCE | Admitting: Foot and Ankle Surgery

## 2021-09-13 ENCOUNTER — Encounter: Admit: 2021-09-13 | Payer: PRIVATE HEALTH INSURANCE

## 2021-09-13 ENCOUNTER — Ambulatory Visit: Admit: 2021-09-13 | Payer: PRIVATE HEALTH INSURANCE

## 2021-09-17 MED ORDER — ASPIRIN 81 MG CHEWABLE TABLET
ORAL_TABLET | Freq: Every day | ORAL | 0 refills | 30 days | Status: CP
Start: 2021-09-17 — End: 2021-10-17

## 2021-09-17 MED ORDER — ASCORBIC ACID (VITAMIN C) 500 MG TABLET
ORAL_TABLET | Freq: Every day | ORAL | 0 refills | 100 days | Status: CP
Start: 2021-09-17 — End: 2021-12-26
  Filled 2021-09-20: qty 100, 100d supply, fill #0

## 2021-09-17 MED ORDER — PROMETHAZINE 25 MG TABLET
ORAL_TABLET | Freq: Three times a day (TID) | ORAL | 0 refills | 20 days | Status: CP | PRN
Start: 2021-09-17 — End: ?
  Filled 2021-09-20: qty 60, 20d supply, fill #0

## 2021-09-17 MED ORDER — ACETAMINOPHEN 500 MG TABLET
ORAL_TABLET | Freq: Three times a day (TID) | ORAL | 0 refills | 14 days | Status: CP
Start: 2021-09-17 — End: 2021-10-01
  Filled 2021-09-20: qty 84, 14d supply, fill #0

## 2021-09-17 MED ORDER — IBUPROFEN 800 MG TABLET
ORAL_TABLET | Freq: Three times a day (TID) | ORAL | 0 refills | 10 days | Status: CP | PRN
Start: 2021-09-17 — End: 2021-09-27

## 2021-09-17 MED ORDER — CALCIUM CARBONATE 500 MG CALCIUM (1,250 MG) TABLET
ORAL_TABLET | Freq: Every day | ORAL | 0 refills | 60 days | Status: CP
Start: 2021-09-17 — End: 2021-11-16
  Filled 2021-09-20: qty 60, 60d supply, fill #0

## 2021-09-17 MED ORDER — ERGOCALCIFEROL (VITAMIN D2) 1,250 MCG (50,000 UNIT) CAPSULE
ORAL_CAPSULE | ORAL | 0 refills | 56 days | Status: CP
Start: 2021-09-17 — End: 2021-11-12
  Filled 2021-09-20: qty 8, 56d supply, fill #0

## 2021-09-17 MED ORDER — OXYCODONE 5 MG TABLET
ORAL_TABLET | ORAL | 0 refills | 5 days | Status: CP | PRN
Start: 2021-09-17 — End: 2021-09-22
  Filled 2021-09-20: qty 30, 5d supply, fill #0

## 2021-09-17 MED ORDER — DOCUSATE SODIUM 100 MG CAPSULE
ORAL_CAPSULE | Freq: Two times a day (BID) | ORAL | 0 refills | 14 days | Status: CP
Start: 2021-09-17 — End: 2021-10-01
  Filled 2021-09-20: qty 28, 14d supply, fill #0

## 2021-09-20 MED ORDER — PIPERACILLIN-TAZOBACTAM 3.375 G 100ML MINI-BAG PLUS (COMPONENTS)
Freq: Three times a day (TID) | INTRAVENOUS | 0 refills | 30 days | Status: CP
Start: 2021-09-20 — End: 2021-10-20

## 2021-09-20 MED ORDER — LOPERAMIDE 2 MG TABLET
ORAL_TABLET | Freq: Three times a day (TID) | ORAL | 0 refills | 12 days | Status: CP | PRN
Start: 2021-09-20 — End: 2021-10-20
  Filled 2021-09-20: qty 36, 12d supply, fill #0

## 2021-09-20 MED FILL — IBUPROFEN 800 MG TABLET: ORAL | 10 days supply | Qty: 30 | Fill #0

## 2021-09-20 MED FILL — ASPIRIN 81 MG CHEWABLE TABLET: ORAL | 30 days supply | Qty: 30 | Fill #0

## 2021-09-26 ENCOUNTER — Ambulatory Visit: Admit: 2021-09-26 | Discharge: 2021-09-27 | Payer: PRIVATE HEALTH INSURANCE

## 2021-09-26 ENCOUNTER — Institutional Professional Consult (permissible substitution): Admit: 2021-09-26 | Discharge: 2021-09-27 | Payer: PRIVATE HEALTH INSURANCE

## 2021-09-26 DIAGNOSIS — Z794 Long term (current) use of insulin: Principal | ICD-10-CM

## 2021-09-26 DIAGNOSIS — E1165 Type 2 diabetes mellitus with hyperglycemia: Principal | ICD-10-CM

## 2021-09-26 MED ORDER — INSULIN ASPART U-100  100 UNIT/ML SUBCUTANEOUS SOLUTION
3 refills | 0 days
Start: 2021-09-26 — End: ?

## 2021-10-02 ENCOUNTER — Ambulatory Visit
Admit: 2021-10-02 | Discharge: 2021-10-03 | Payer: PRIVATE HEALTH INSURANCE | Attending: Foot and Ankle Surgery | Primary: Foot and Ankle Surgery

## 2021-10-04 ENCOUNTER — Telehealth: Admit: 2021-10-04 | Discharge: 2021-10-05 | Payer: PRIVATE HEALTH INSURANCE | Attending: Family | Primary: Family

## 2021-10-04 DIAGNOSIS — S91301D Unspecified open wound, right foot, subsequent encounter: Principal | ICD-10-CM

## 2021-10-04 DIAGNOSIS — Z792 Long term (current) use of antibiotics: Principal | ICD-10-CM

## 2021-10-04 DIAGNOSIS — M869 Osteomyelitis, unspecified: Principal | ICD-10-CM

## 2021-10-04 MED ORDER — TRAMADOL 50 MG TABLET
ORAL_TABLET | Freq: Three times a day (TID) | ORAL | 0 refills | 5 days | Status: CP | PRN
Start: 2021-10-04 — End: ?

## 2021-10-08 DIAGNOSIS — G4700 Insomnia, unspecified: Principal | ICD-10-CM

## 2021-10-08 MED ORDER — SUVOREXANT 10 MG TABLET
ORAL_TABLET | Freq: Every evening | ORAL | 1 refills | 90 days | Status: CP
Start: 2021-10-08 — End: ?

## 2021-10-09 ENCOUNTER — Ambulatory Visit
Admit: 2021-10-09 | Discharge: 2021-10-10 | Payer: PRIVATE HEALTH INSURANCE | Attending: Student in an Organized Health Care Education/Training Program | Primary: Student in an Organized Health Care Education/Training Program

## 2021-10-09 DIAGNOSIS — E1165 Type 2 diabetes mellitus with hyperglycemia: Principal | ICD-10-CM

## 2021-10-09 DIAGNOSIS — K3184 Gastroparesis: Principal | ICD-10-CM

## 2021-10-09 DIAGNOSIS — E1143 Type 2 diabetes mellitus with diabetic autonomic (poly)neuropathy: Principal | ICD-10-CM

## 2021-10-09 DIAGNOSIS — G63 Polyneuropathy in diseases classified elsewhere: Principal | ICD-10-CM

## 2021-10-09 DIAGNOSIS — Z794 Long term (current) use of insulin: Principal | ICD-10-CM

## 2021-10-10 ENCOUNTER — Ambulatory Visit
Admit: 2021-10-10 | Discharge: 2021-10-11 | Payer: PRIVATE HEALTH INSURANCE | Attending: Vascular Surgery | Primary: Vascular Surgery

## 2021-10-24 ENCOUNTER — Ambulatory Visit
Admit: 2021-10-24 | Discharge: 2021-10-25 | Payer: PRIVATE HEALTH INSURANCE | Attending: Vascular Surgery | Primary: Vascular Surgery

## 2021-10-24 DIAGNOSIS — L97512 Non-pressure chronic ulcer of other part of right foot with fat layer exposed: Principal | ICD-10-CM

## 2021-10-26 ENCOUNTER — Ambulatory Visit: Admit: 2021-10-26 | Discharge: 2021-10-27 | Payer: PRIVATE HEALTH INSURANCE

## 2021-10-26 MED ORDER — OXYCODONE-ACETAMINOPHEN 5 MG-325 MG TABLET
ORAL_TABLET | Freq: Every evening | ORAL | 0 refills | 30 days | Status: CP | PRN
Start: 2021-10-26 — End: ?

## 2021-10-29 ENCOUNTER — Ambulatory Visit
Admit: 2021-10-29 | Discharge: 2021-10-29 | Payer: PRIVATE HEALTH INSURANCE | Attending: Foot and Ankle Surgery | Primary: Foot and Ankle Surgery

## 2021-10-29 ENCOUNTER — Ambulatory Visit
Admit: 2021-10-29 | Discharge: 2021-10-29 | Payer: PRIVATE HEALTH INSURANCE | Attending: Student in an Organized Health Care Education/Training Program | Primary: Student in an Organized Health Care Education/Training Program

## 2021-10-29 DIAGNOSIS — M869 Osteomyelitis, unspecified: Principal | ICD-10-CM

## 2021-11-01 MED ORDER — HYDRALAZINE 25 MG TABLET
ORAL_TABLET | Freq: Three times a day (TID) | ORAL | 5 refills | 30 days | Status: CP
Start: 2021-11-01 — End: 2022-11-01

## 2021-11-05 ENCOUNTER — Ambulatory Visit: Admit: 2021-11-05 | Discharge: 2021-11-06 | Payer: PRIVATE HEALTH INSURANCE

## 2021-11-05 MED ORDER — SPIRONOLACTONE 100 MG TABLET
ORAL_TABLET | Freq: Every day | ORAL | 1 refills | 90 days | Status: CP
Start: 2021-11-05 — End: ?

## 2021-11-07 ENCOUNTER — Ambulatory Visit
Admit: 2021-11-07 | Discharge: 2021-11-08 | Payer: PRIVATE HEALTH INSURANCE | Attending: Vascular Surgery | Primary: Vascular Surgery

## 2021-11-07 DIAGNOSIS — L97512 Non-pressure chronic ulcer of other part of right foot with fat layer exposed: Principal | ICD-10-CM

## 2021-11-07 DIAGNOSIS — L97519 Non-pressure chronic ulcer of other part of right foot with unspecified severity: Principal | ICD-10-CM

## 2021-11-07 DIAGNOSIS — E11621 Type 2 diabetes mellitus with foot ulcer: Principal | ICD-10-CM

## 2021-11-08 ENCOUNTER — Ambulatory Visit: Admit: 2021-11-08 | Discharge: 2021-11-09 | Payer: PRIVATE HEALTH INSURANCE

## 2021-11-08 DIAGNOSIS — L97512 Non-pressure chronic ulcer of other part of right foot with fat layer exposed: Principal | ICD-10-CM

## 2021-11-08 DIAGNOSIS — W19XXXA Unspecified fall, initial encounter: Principal | ICD-10-CM

## 2021-11-08 DIAGNOSIS — L97519 Non-pressure chronic ulcer of other part of right foot with unspecified severity: Principal | ICD-10-CM

## 2021-11-08 DIAGNOSIS — E11621 Type 2 diabetes mellitus with foot ulcer: Principal | ICD-10-CM

## 2021-11-12 MED ORDER — AMOXICILLIN 875 MG-POTASSIUM CLAVULANATE 125 MG TABLET
ORAL_TABLET | Freq: Two times a day (BID) | ORAL | 0 refills | 14 days | Status: CP
Start: 2021-11-12 — End: 2021-11-26

## 2021-11-19 ENCOUNTER — Ambulatory Visit
Admit: 2021-11-19 | Discharge: 2021-11-20 | Payer: PRIVATE HEALTH INSURANCE | Attending: Student in an Organized Health Care Education/Training Program | Primary: Student in an Organized Health Care Education/Training Program

## 2021-11-19 DIAGNOSIS — M869 Osteomyelitis, unspecified: Principal | ICD-10-CM

## 2021-11-19 DIAGNOSIS — Z792 Long term (current) use of antibiotics: Principal | ICD-10-CM

## 2021-11-20 ENCOUNTER — Ambulatory Visit: Admit: 2021-11-20 | Discharge: 2021-11-21 | Payer: PRIVATE HEALTH INSURANCE

## 2021-11-20 DIAGNOSIS — E785 Hyperlipidemia, unspecified: Principal | ICD-10-CM

## 2021-11-20 DIAGNOSIS — I10 Essential (primary) hypertension: Principal | ICD-10-CM

## 2021-11-20 DIAGNOSIS — I2510 Atherosclerotic heart disease of native coronary artery without angina pectoris: Principal | ICD-10-CM

## 2021-11-21 ENCOUNTER — Ambulatory Visit
Admit: 2021-11-21 | Discharge: 2021-11-22 | Payer: PRIVATE HEALTH INSURANCE | Attending: Vascular Surgery | Primary: Vascular Surgery

## 2021-11-21 DIAGNOSIS — L97512 Non-pressure chronic ulcer of other part of right foot with fat layer exposed: Principal | ICD-10-CM

## 2021-11-26 MED ORDER — SERTRALINE 100 MG TABLET
ORAL_TABLET | Freq: Every day | ORAL | 3 refills | 90 days | Status: CP
Start: 2021-11-26 — End: 2022-11-26

## 2021-11-27 ENCOUNTER — Institutional Professional Consult (permissible substitution): Admit: 2021-11-27 | Discharge: 2021-11-28 | Payer: PRIVATE HEALTH INSURANCE

## 2021-11-27 DIAGNOSIS — E1165 Type 2 diabetes mellitus with hyperglycemia: Principal | ICD-10-CM

## 2021-11-27 DIAGNOSIS — Z794 Long term (current) use of insulin: Principal | ICD-10-CM

## 2021-11-27 MED ORDER — BLOOD-GLUCOSE TRANSMITTER DEVICE
3 refills | 0 days | Status: CP
Start: 2021-11-27 — End: ?

## 2021-11-27 MED ORDER — DEXCOM G6 SENSOR DEVICE
11 refills | 0 days | Status: CP
Start: 2021-11-27 — End: ?

## 2021-12-03 ENCOUNTER — Ambulatory Visit
Admit: 2021-12-03 | Discharge: 2021-12-04 | Payer: PRIVATE HEALTH INSURANCE | Attending: Foot and Ankle Surgery | Primary: Foot and Ankle Surgery

## 2021-12-04 ENCOUNTER — Ambulatory Visit
Admit: 2021-12-04 | Discharge: 2021-12-05 | Payer: PRIVATE HEALTH INSURANCE | Attending: Rehabilitative and Restorative Service Providers" | Primary: Rehabilitative and Restorative Service Providers"

## 2021-12-12 ENCOUNTER — Ambulatory Visit: Admit: 2021-12-12 | Discharge: 2021-12-13 | Payer: PRIVATE HEALTH INSURANCE

## 2021-12-14 MED ORDER — INSULIN ASPART U-100  100 UNIT/ML SUBCUTANEOUS SOLUTION
12 refills | 0 days | Status: CP
Start: 2021-12-14 — End: ?

## 2021-12-15 DIAGNOSIS — E1165 Type 2 diabetes mellitus with hyperglycemia: Principal | ICD-10-CM

## 2021-12-15 DIAGNOSIS — Z794 Long term (current) use of insulin: Principal | ICD-10-CM

## 2021-12-15 MED ORDER — INSULIN ASPART U-100  100 UNIT/ML SUBCUTANEOUS SOLUTION
12 refills | 0 days | Status: CP
Start: 2021-12-15 — End: ?

## 2021-12-17 ENCOUNTER — Ambulatory Visit: Admit: 2021-12-17 | Discharge: 2021-12-18 | Payer: PRIVATE HEALTH INSURANCE

## 2021-12-17 MED ORDER — HYDROXYZINE HCL 25 MG TABLET
ORAL_TABLET | Freq: Every day | ORAL | 3 refills | 90 days | Status: CP
Start: 2021-12-17 — End: 2022-12-17

## 2021-12-22 MED ORDER — GABAPENTIN 300 MG CAPSULE
ORAL_CAPSULE | 1 refills | 0 days
Start: 2021-12-22 — End: ?

## 2021-12-24 MED ORDER — GABAPENTIN 300 MG CAPSULE
ORAL_CAPSULE | 1 refills | 0 days | Status: CP
Start: 2021-12-24 — End: ?

## 2021-12-26 ENCOUNTER — Ambulatory Visit
Admit: 2021-12-26 | Discharge: 2021-12-27 | Payer: PRIVATE HEALTH INSURANCE | Attending: Vascular Surgery | Primary: Vascular Surgery

## 2021-12-26 DIAGNOSIS — L97512 Non-pressure chronic ulcer of other part of right foot with fat layer exposed: Principal | ICD-10-CM

## 2021-12-26 MED ORDER — SUVOREXANT 10 MG TABLET
ORAL_TABLET | Freq: Every evening | ORAL | 0 refills | 90 days | Status: CP
Start: 2021-12-26 — End: ?

## 2022-01-02 MED ORDER — EZETIMIBE 10 MG TABLET
ORAL_TABLET | Freq: Every day | ORAL | 3 refills | 90 days | Status: CP
Start: 2022-01-02 — End: 2023-01-02

## 2022-01-05 MED ORDER — CYCLOBENZAPRINE 10 MG TABLET
ORAL_TABLET | 1 refills | 0 days
Start: 2022-01-05 — End: ?

## 2022-01-08 MED ORDER — CYCLOBENZAPRINE 10 MG TABLET
ORAL_TABLET | 1 refills | 0 days | Status: CP
Start: 2022-01-08 — End: ?

## 2022-01-13 MED ORDER — PROMETHAZINE 25 MG TABLET
ORAL_TABLET | 0 refills | 0 days
Start: 2022-01-13 — End: ?

## 2022-01-14 MED ORDER — PROMETHAZINE 25 MG TABLET
ORAL_TABLET | 3 refills | 0 days | Status: CP
Start: 2022-01-14 — End: ?

## 2022-01-18 ENCOUNTER — Ambulatory Visit
Admit: 2022-01-18 | Discharge: 2022-01-19 | Payer: PRIVATE HEALTH INSURANCE | Attending: Student in an Organized Health Care Education/Training Program | Primary: Student in an Organized Health Care Education/Training Program

## 2022-01-18 DIAGNOSIS — E118 Type 2 diabetes mellitus with unspecified complications: Principal | ICD-10-CM

## 2022-01-18 DIAGNOSIS — E1165 Type 2 diabetes mellitus with hyperglycemia: Principal | ICD-10-CM

## 2022-01-18 DIAGNOSIS — Z794 Long term (current) use of insulin: Principal | ICD-10-CM

## 2022-01-18 MED ORDER — NEXLETOL 180 MG TABLET
ORAL_TABLET | Freq: Every day | ORAL | 1 refills | 0 days | Status: CP
Start: 2022-01-18 — End: ?

## 2022-01-18 MED ORDER — DEXCOM G6 SENSOR DEVICE
11 refills | 0 days | Status: CP
Start: 2022-01-18 — End: ?

## 2022-01-18 MED ORDER — INSULIN SYRINGE U-100 WITH NEEDLE 0.5 ML 31 GAUGE X 5/16" (8 MM)
5 refills | 0 days | Status: CP
Start: 2022-01-18 — End: ?

## 2022-01-22 DIAGNOSIS — S91301D Unspecified open wound, right foot, subsequent encounter: Principal | ICD-10-CM

## 2022-01-22 DIAGNOSIS — S82831A Other fracture of upper and lower end of right fibula, initial encounter for closed fracture: Principal | ICD-10-CM

## 2022-01-30 ENCOUNTER — Ambulatory Visit: Admit: 2022-01-30 | Payer: PRIVATE HEALTH INSURANCE

## 2022-01-30 ENCOUNTER — Ambulatory Visit: Admit: 2022-01-30 | Discharge: 2022-02-28 | Payer: PRIVATE HEALTH INSURANCE

## 2022-01-30 ENCOUNTER — Ambulatory Visit
Admit: 2022-01-30 | Payer: PRIVATE HEALTH INSURANCE | Attending: Rehabilitative and Restorative Service Providers" | Primary: Rehabilitative and Restorative Service Providers"

## 2022-02-06 ENCOUNTER — Ambulatory Visit
Admit: 2022-02-06 | Discharge: 2022-02-07 | Payer: PRIVATE HEALTH INSURANCE | Attending: Vascular Surgery | Primary: Vascular Surgery

## 2022-02-06 DIAGNOSIS — L84 Corns and callosities: Principal | ICD-10-CM

## 2022-02-11 ENCOUNTER — Ambulatory Visit: Admit: 2022-02-11 | Discharge: 2022-02-12 | Payer: PRIVATE HEALTH INSURANCE | Attending: Urology | Primary: Urology

## 2022-02-26 ENCOUNTER — Ambulatory Visit
Admit: 2022-02-26 | Discharge: 2022-02-26 | Disposition: A | Discharge status: Home / Self Care | Payer: PRIVATE HEALTH INSURANCE | Attending: Student in an Organized Health Care Education/Training Program

## 2022-02-26 ENCOUNTER — Emergency Department
Admit: 2022-02-26 | Discharge: 2022-02-26 | Disposition: A | Discharge status: Home / Self Care | Payer: PRIVATE HEALTH INSURANCE | Attending: Student in an Organized Health Care Education/Training Program

## 2022-02-26 DIAGNOSIS — M79605 Pain in left leg: Principal | ICD-10-CM

## 2022-03-07 ENCOUNTER — Emergency Department
Admit: 2022-03-07 | Discharge: 2022-03-07 | Disposition: A | Discharge status: Home / Self Care | Payer: PRIVATE HEALTH INSURANCE

## 2022-03-07 ENCOUNTER — Ambulatory Visit
Admit: 2022-03-07 | Discharge: 2022-03-07 | Disposition: A | Discharge status: Home / Self Care | Payer: PRIVATE HEALTH INSURANCE

## 2022-03-07 DIAGNOSIS — S83412A Sprain of medial collateral ligament of left knee, initial encounter: Principal | ICD-10-CM

## 2022-03-07 DIAGNOSIS — S8002XA Contusion of left knee, initial encounter: Principal | ICD-10-CM

## 2022-03-07 DIAGNOSIS — M25562 Pain in left knee: Principal | ICD-10-CM

## 2022-03-07 DIAGNOSIS — S83512A Sprain of anterior cruciate ligament of left knee, initial encounter: Principal | ICD-10-CM

## 2022-03-07 MED ORDER — OXYCODONE 5 MG TABLET
ORAL_TABLET | Freq: Three times a day (TID) | ORAL | 0 refills | 3 days | Status: CP | PRN
Start: 2022-03-07 — End: 2022-03-10

## 2022-03-07 MED ORDER — DOCUSATE SODIUM 100 MG TABLET
ORAL_TABLET | Freq: Every day | ORAL | 0 refills | 30 days | Status: CP
Start: 2022-03-07 — End: 2022-04-06

## 2022-03-08 ENCOUNTER — Ambulatory Visit: Admit: 2022-03-08 | Discharge: 2022-03-09 | Payer: PRIVATE HEALTH INSURANCE

## 2022-03-11 DIAGNOSIS — S8992XA Unspecified injury of left lower leg, initial encounter: Principal | ICD-10-CM

## 2022-03-11 MED ORDER — HYDROCODONE 5 MG-ACETAMINOPHEN 325 MG TABLET
ORAL_TABLET | Freq: Three times a day (TID) | ORAL | 0 refills | 4 days | Status: CP | PRN
Start: 2022-03-11 — End: ?

## 2022-03-12 ENCOUNTER — Ambulatory Visit: Admit: 2022-03-12 | Discharge: 2022-03-13 | Payer: PRIVATE HEALTH INSURANCE

## 2022-03-12 DIAGNOSIS — E785 Hyperlipidemia, unspecified: Principal | ICD-10-CM

## 2022-03-12 DIAGNOSIS — Z01810 Encounter for preprocedural cardiovascular examination: Principal | ICD-10-CM

## 2022-03-12 DIAGNOSIS — I2510 Atherosclerotic heart disease of native coronary artery without angina pectoris: Principal | ICD-10-CM

## 2022-03-12 MED ORDER — NEXLETOL 180 MG TABLET
ORAL_TABLET | Freq: Every day | ORAL | 1 refills | 0 days | Status: CP
Start: 2022-03-12 — End: ?

## 2022-03-12 MED ORDER — ASPIRIN 81 MG CHEWABLE TABLET
ORAL_TABLET | Freq: Every day | ORAL | 0 refills | 30 days | Status: CP
Start: 2022-03-12 — End: 2022-04-11

## 2022-03-12 MED ORDER — AMLODIPINE 10 MG TABLET
ORAL_TABLET | Freq: Every day | ORAL | 3 refills | 90 days | Status: CP
Start: 2022-03-12 — End: ?

## 2022-03-12 MED ORDER — LOSARTAN 50 MG TABLET
ORAL_TABLET | Freq: Every day | ORAL | 3 refills | 90 days | Status: CP
Start: 2022-03-12 — End: 2023-03-12

## 2022-03-14 ENCOUNTER — Ambulatory Visit: Admit: 2022-03-14 | Discharge: 2022-03-15 | Payer: PRIVATE HEALTH INSURANCE

## 2022-03-14 DIAGNOSIS — F331 Major depressive disorder, recurrent, moderate: Principal | ICD-10-CM

## 2022-03-14 MED ORDER — DICLOFENAC 1 % TOPICAL GEL
Freq: Three times a day (TID) | TOPICAL | 0 refills | 50 days | Status: CP
Start: 2022-03-14 — End: ?

## 2022-03-18 DIAGNOSIS — G4700 Insomnia, unspecified: Principal | ICD-10-CM

## 2022-03-18 MED ORDER — BELSOMRA 10 MG TABLET
ORAL_TABLET | Freq: Every evening | ORAL | 1 refills | 90 days | Status: CP
Start: 2022-03-18 — End: ?

## 2022-03-20 ENCOUNTER — Ambulatory Visit: Admit: 2022-03-20 | Discharge: 2022-03-21 | Payer: PRIVATE HEALTH INSURANCE

## 2022-03-20 DIAGNOSIS — G4700 Insomnia, unspecified: Principal | ICD-10-CM

## 2022-03-20 DIAGNOSIS — E11621 Type 2 diabetes mellitus with foot ulcer: Principal | ICD-10-CM

## 2022-03-20 DIAGNOSIS — L97519 Non-pressure chronic ulcer of other part of right foot with unspecified severity: Principal | ICD-10-CM

## 2022-03-22 DIAGNOSIS — G4700 Insomnia, unspecified: Principal | ICD-10-CM

## 2022-03-22 MED ORDER — SUVOREXANT 10 MG TABLET
ORAL_TABLET | Freq: Every evening | ORAL | 1 refills | 90 days | Status: CP
Start: 2022-03-22 — End: ?

## 2022-03-22 MED ORDER — TRAMADOL 50 MG TABLET
ORAL_TABLET | Freq: Three times a day (TID) | ORAL | 0 refills | 10 days | Status: CP | PRN
Start: 2022-03-22 — End: ?

## 2022-03-25 ENCOUNTER — Ambulatory Visit: Admit: 2022-03-25 | Discharge: 2022-03-26 | Payer: PRIVATE HEALTH INSURANCE

## 2022-03-25 DIAGNOSIS — M25562 Pain in left knee: Principal | ICD-10-CM

## 2022-03-25 DIAGNOSIS — E7800 Pure hypercholesterolemia, unspecified: Principal | ICD-10-CM

## 2022-03-25 DIAGNOSIS — R296 Repeated falls: Principal | ICD-10-CM

## 2022-03-25 DIAGNOSIS — F330 Major depressive disorder, recurrent, mild: Principal | ICD-10-CM

## 2022-03-25 MED ORDER — DULOXETINE 30 MG CAPSULE,DELAYED RELEASE
ORAL_CAPSULE | ORAL | 0 refills | 31 days | Status: CP
Start: 2022-03-25 — End: 2022-04-25

## 2022-04-08 ENCOUNTER — Ambulatory Visit
Admit: 2022-04-08 | Discharge: 2022-04-09 | Payer: PRIVATE HEALTH INSURANCE | Attending: Rehabilitative and Restorative Service Providers" | Primary: Rehabilitative and Restorative Service Providers"

## 2022-04-08 MED ORDER — TRAMADOL 50 MG TABLET
ORAL_TABLET | 0 refills | 0 days
Start: 2022-04-08 — End: ?

## 2022-04-09 ENCOUNTER — Ambulatory Visit: Admit: 2022-04-09 | Discharge: 2022-04-15 | Payer: PRIVATE HEALTH INSURANCE

## 2022-04-09 ENCOUNTER — Ambulatory Visit
Admit: 2022-04-09 | Discharge: 2022-04-15 | Disposition: A | Discharge status: Home / Self Care | Payer: PRIVATE HEALTH INSURANCE | Admitting: Physical Medicine & Rehabilitation

## 2022-04-09 MED ORDER — TRAMADOL 50 MG TABLET
ORAL_TABLET | Freq: Three times a day (TID) | ORAL | 0 refills | 5 days | Status: CP | PRN
Start: 2022-04-09 — End: ?

## 2022-04-11 ENCOUNTER — Ambulatory Visit: Admit: 2022-04-11 | Payer: PRIVATE HEALTH INSURANCE

## 2022-04-15 ENCOUNTER — Ambulatory Visit
Admission: TF | Admit: 2022-04-15 | Discharge: 2022-04-24 | Disposition: A | Discharge status: Home Health | Payer: PRIVATE HEALTH INSURANCE | Source: Intra-hospital | Admitting: Physical Medicine & Rehabilitation

## 2022-04-19 MED ORDER — HYDRALAZINE 25 MG TABLET
ORAL_TABLET | Freq: Three times a day (TID) | ORAL | 0 refills | 30 days | Status: CP
Start: 2022-04-19 — End: 2022-05-19

## 2022-04-19 MED ORDER — METOPROLOL SUCCINATE ER 25 MG TABLET,EXTENDED RELEASE 24 HR
ORAL_TABLET | Freq: Every day | ORAL | 0 refills | 30 days | Status: CP
Start: 2022-04-19 — End: 2022-05-19
  Filled 2022-04-24: qty 30, 30d supply, fill #0

## 2022-04-19 MED ORDER — ATORVASTATIN 80 MG TABLET
ORAL_TABLET | Freq: Every day | ORAL | 0 refills | 30 days | Status: CP
Start: 2022-04-19 — End: 2022-05-19
  Filled 2022-04-24: qty 30, 30d supply, fill #0

## 2022-04-19 MED ORDER — METFORMIN ER 500 MG TABLET,EXTENDED RELEASE 24 HR
ORAL_TABLET | Freq: Two times a day (BID) | ORAL | 0 refills | 30 days | Status: CP
Start: 2022-04-19 — End: 2022-05-19
  Filled 2022-04-24: qty 60, 30d supply, fill #0

## 2022-04-19 MED ORDER — LOSARTAN 50 MG TABLET
ORAL_TABLET | Freq: Every day | ORAL | 0 refills | 30 days | Status: CP
Start: 2022-04-19 — End: 2022-05-19

## 2022-04-19 MED ORDER — AMLODIPINE 10 MG TABLET
ORAL_TABLET | Freq: Every day | ORAL | 0 refills | 30 days | Status: CP
Start: 2022-04-19 — End: 2022-05-19

## 2022-04-19 MED ORDER — GABAPENTIN 300 MG CAPSULE
ORAL_CAPSULE | 1 refills | 0 days | Status: CP
Start: 2022-04-19 — End: ?

## 2022-04-19 MED ORDER — CYCLOBENZAPRINE 10 MG TABLET
ORAL_TABLET | Freq: Three times a day (TID) | ORAL | 0 refills | 30 days | Status: CP | PRN
Start: 2022-04-19 — End: 2022-05-19
  Filled 2022-04-24: qty 90, 30d supply, fill #0

## 2022-04-19 MED ORDER — ASPIRIN 81 MG CHEWABLE TABLET
ORAL_TABLET | Freq: Every day | ORAL | 0 refills | 30 days | Status: CP
Start: 2022-04-19 — End: 2022-05-19
  Filled 2022-04-24: qty 30, 30d supply, fill #0

## 2022-04-19 MED ORDER — HYDROXYZINE HCL 25 MG TABLET
ORAL_TABLET | Freq: Every day | ORAL | 0 refills | 30 days | Status: CP
Start: 2022-04-19 — End: 2022-05-19
  Filled 2022-04-24: qty 30, 30d supply, fill #0

## 2022-04-20 MED ORDER — DULOXETINE 60 MG CAPSULE,DELAYED RELEASE
ORAL_CAPSULE | Freq: Every day | ORAL | 0 refills | 30 days | Status: CP
Start: 2022-04-20 — End: 2022-05-20
  Filled 2022-04-24: qty 30, 30d supply, fill #0

## 2022-04-22 ENCOUNTER — Inpatient Hospital Stay: Admit: 2022-04-22 | Payer: PRIVATE HEALTH INSURANCE

## 2022-04-29 ENCOUNTER — Encounter: Admit: 2022-04-29 | Payer: PRIVATE HEALTH INSURANCE

## 2022-04-29 ENCOUNTER — Emergency Department
Admit: 2022-04-29 | Discharge: 2022-04-30 | Disposition: A | Discharge status: Home / Self Care | Payer: PRIVATE HEALTH INSURANCE

## 2022-04-29 ENCOUNTER — Ambulatory Visit
Admit: 2022-04-29 | Discharge: 2022-04-30 | Disposition: A | Discharge status: Home / Self Care | Payer: PRIVATE HEALTH INSURANCE

## 2022-04-29 DIAGNOSIS — R079 Chest pain, unspecified: Principal | ICD-10-CM

## 2022-05-01 ENCOUNTER — Institutional Professional Consult (permissible substitution): Admit: 2022-05-01 | Discharge: 2022-05-02 | Payer: PRIVATE HEALTH INSURANCE

## 2022-05-01 DIAGNOSIS — R002 Palpitations: Principal | ICD-10-CM

## 2022-05-02 DIAGNOSIS — F330 Major depressive disorder, recurrent, mild: Principal | ICD-10-CM

## 2022-05-02 MED ORDER — DULOXETINE 30 MG CAPSULE,DELAYED RELEASE
ORAL_CAPSULE | 0 refills | 0 days
Start: 2022-05-02 — End: ?

## 2022-05-03 MED ORDER — DULOXETINE 60 MG CAPSULE,DELAYED RELEASE
ORAL_CAPSULE | Freq: Every day | ORAL | 3 refills | 90 days | Status: CP
Start: 2022-05-03 — End: 2022-06-02

## 2022-05-03 MED ORDER — DULOXETINE 30 MG CAPSULE,DELAYED RELEASE
ORAL_CAPSULE | 0 refills | 0 days
Start: 2022-05-03 — End: ?

## 2022-05-08 ENCOUNTER — Ambulatory Visit: Admit: 2022-05-08 | Discharge: 2022-05-09 | Payer: PRIVATE HEALTH INSURANCE

## 2022-05-14 ENCOUNTER — Telehealth: Admit: 2022-05-14 | Discharge: 2022-05-15 | Payer: PRIVATE HEALTH INSURANCE

## 2022-05-14 DIAGNOSIS — F331 Major depressive disorder, recurrent, moderate: Principal | ICD-10-CM

## 2022-05-14 DIAGNOSIS — F411 Generalized anxiety disorder: Principal | ICD-10-CM

## 2022-05-14 DIAGNOSIS — G4700 Insomnia, unspecified: Principal | ICD-10-CM

## 2022-05-16 DIAGNOSIS — S83512A Sprain of anterior cruciate ligament of left knee, initial encounter: Principal | ICD-10-CM

## 2022-05-22 ENCOUNTER — Ambulatory Visit: Admit: 2022-05-22 | Payer: PRIVATE HEALTH INSURANCE

## 2022-05-22 ENCOUNTER — Ambulatory Visit: Admit: 2022-05-22 | Discharge: 2022-06-20 | Payer: PRIVATE HEALTH INSURANCE

## 2022-05-28 MED ORDER — HYDRALAZINE 25 MG TABLET
ORAL_TABLET | 5 refills | 0 days
Start: 2022-05-28 — End: ?

## 2022-05-29 MED ORDER — TRAMADOL 50 MG TABLET
ORAL_TABLET | 0 refills | 0 days
Start: 2022-05-29 — End: ?

## 2022-05-30 MED ORDER — TRAMADOL 50 MG TABLET
ORAL_TABLET | Freq: Three times a day (TID) | ORAL | 3 refills | 5 days | Status: CP | PRN
Start: 2022-05-30 — End: ?

## 2022-05-31 MED ORDER — HYDRALAZINE 25 MG TABLET
ORAL_TABLET | 5 refills | 0 days
Start: 2022-05-31 — End: ?

## 2022-06-14 MED ORDER — GABAPENTIN 300 MG CAPSULE
ORAL_CAPSULE | 1 refills | 0 days | Status: CP
Start: 2022-06-14 — End: ?

## 2022-06-23 MED ORDER — PROMETHAZINE 25 MG TABLET
ORAL_TABLET | 3 refills | 0 days
Start: 2022-06-23 — End: ?

## 2022-06-24 ENCOUNTER — Ambulatory Visit: Admit: 2022-06-24 | Discharge: 2022-06-25 | Payer: PRIVATE HEALTH INSURANCE

## 2022-06-24 DIAGNOSIS — E7800 Pure hypercholesterolemia, unspecified: Principal | ICD-10-CM

## 2022-06-24 DIAGNOSIS — Z794 Long term (current) use of insulin: Principal | ICD-10-CM

## 2022-06-24 DIAGNOSIS — I10 Essential (primary) hypertension: Principal | ICD-10-CM

## 2022-06-24 DIAGNOSIS — E1165 Type 2 diabetes mellitus with hyperglycemia: Principal | ICD-10-CM

## 2022-06-24 DIAGNOSIS — G4700 Insomnia, unspecified: Principal | ICD-10-CM

## 2022-06-24 DIAGNOSIS — I2111 ST elevation (STEMI) myocardial infarction involving right coronary artery: Principal | ICD-10-CM

## 2022-06-24 DIAGNOSIS — F331 Major depressive disorder, recurrent, moderate: Principal | ICD-10-CM

## 2022-06-24 MED ORDER — DULOXETINE 30 MG CAPSULE,DELAYED RELEASE
ORAL_CAPSULE | Freq: Every day | ORAL | 3 refills | 90 days | Status: CP
Start: 2022-06-24 — End: 2023-06-24

## 2022-06-24 MED ORDER — PROMETHAZINE 25 MG TABLET
ORAL_TABLET | Freq: Two times a day (BID) | ORAL | 3 refills | 30 days | Status: CP | PRN
Start: 2022-06-24 — End: ?

## 2022-06-24 MED ORDER — LOSARTAN 50 MG TABLET
ORAL_TABLET | Freq: Every day | ORAL | 3 refills | 90 days | Status: CP
Start: 2022-06-24 — End: 2023-07-24

## 2022-06-25 ENCOUNTER — Ambulatory Visit: Admit: 2022-06-25 | Discharge: 2022-07-20 | Payer: PRIVATE HEALTH INSURANCE

## 2022-06-25 ENCOUNTER — Ambulatory Visit: Admit: 2022-06-25 | Payer: PRIVATE HEALTH INSURANCE

## 2022-07-03 ENCOUNTER — Ambulatory Visit
Admit: 2022-07-03 | Discharge: 2022-07-03 | Disposition: A | Discharge status: Home / Self Care | Payer: PRIVATE HEALTH INSURANCE | Attending: Emergency Medicine

## 2022-07-03 ENCOUNTER — Emergency Department
Admit: 2022-07-03 | Discharge: 2022-07-03 | Disposition: A | Discharge status: Home / Self Care | Payer: PRIVATE HEALTH INSURANCE | Attending: Emergency Medicine

## 2022-07-03 MED ORDER — POTASSIUM CHLORIDE ER 20 MEQ TABLET,EXTENDED RELEASE(PART/CRYST)
ORAL_TABLET | Freq: Two times a day (BID) | ORAL | 0 refills | 3 days | Status: CP
Start: 2022-07-03 — End: 2022-07-06

## 2022-07-05 ENCOUNTER — Ambulatory Visit: Admit: 2022-07-05 | Discharge: 2022-07-06 | Payer: PRIVATE HEALTH INSURANCE

## 2022-07-05 DIAGNOSIS — I10 Essential (primary) hypertension: Principal | ICD-10-CM

## 2022-07-05 MED ORDER — HYDRALAZINE 25 MG TABLET
ORAL_TABLET | Freq: Three times a day (TID) | ORAL | 3 refills | 90 days | Status: CP
Start: 2022-07-05 — End: ?

## 2022-07-08 ENCOUNTER — Telehealth: Admit: 2022-07-08 | Discharge: 2022-07-09 | Payer: PRIVATE HEALTH INSURANCE | Attending: Clinical | Primary: Clinical

## 2022-07-08 DIAGNOSIS — F439 Reaction to severe stress, unspecified: Principal | ICD-10-CM

## 2022-07-08 DIAGNOSIS — F331 Major depressive disorder, recurrent, moderate: Principal | ICD-10-CM

## 2022-07-09 ENCOUNTER — Ambulatory Visit
Admit: 2022-07-09 | Discharge: 2022-07-10 | Payer: PRIVATE HEALTH INSURANCE | Attending: Student in an Organized Health Care Education/Training Program | Primary: Student in an Organized Health Care Education/Training Program

## 2022-07-09 DIAGNOSIS — Z794 Long term (current) use of insulin: Principal | ICD-10-CM

## 2022-07-09 DIAGNOSIS — E1165 Type 2 diabetes mellitus with hyperglycemia: Principal | ICD-10-CM

## 2022-07-09 MED ORDER — TRULICITY 0.75 MG/0.5 ML SUBCUTANEOUS PEN INJECTOR
SUBCUTANEOUS | 6 refills | 28 days | Status: CP
Start: 2022-07-09 — End: 2023-07-09

## 2022-07-09 MED ORDER — NOVOLOG FLEXPEN U-100 INSULIN ASPART 100 UNIT/ML (3 ML) SUBCUTANEOUS
3 refills | 0 days | Status: CP
Start: 2022-07-09 — End: ?

## 2022-07-12 DIAGNOSIS — G4700 Insomnia, unspecified: Principal | ICD-10-CM

## 2022-07-15 ENCOUNTER — Telehealth: Admit: 2022-07-15 | Discharge: 2022-07-16 | Payer: PRIVATE HEALTH INSURANCE | Attending: Clinical | Primary: Clinical

## 2022-07-15 DIAGNOSIS — G4700 Insomnia, unspecified: Principal | ICD-10-CM

## 2022-07-15 DIAGNOSIS — G8929 Other chronic pain: Principal | ICD-10-CM

## 2022-07-15 DIAGNOSIS — F439 Reaction to severe stress, unspecified: Principal | ICD-10-CM

## 2022-07-15 DIAGNOSIS — F331 Major depressive disorder, recurrent, moderate: Principal | ICD-10-CM

## 2022-07-15 DIAGNOSIS — F330 Major depressive disorder, recurrent, mild: Principal | ICD-10-CM

## 2022-07-15 DIAGNOSIS — I2111 ST elevation (STEMI) myocardial infarction involving right coronary artery: Principal | ICD-10-CM

## 2022-07-15 MED ORDER — DULOXETINE 60 MG CAPSULE,DELAYED RELEASE
ORAL_CAPSULE | Freq: Every day | ORAL | 3 refills | 90 days | Status: CP
Start: 2022-07-15 — End: ?

## 2022-07-15 MED ORDER — TRAMADOL 50 MG TABLET
ORAL_TABLET | Freq: Three times a day (TID) | ORAL | 3 refills | 5 days | Status: CP | PRN
Start: 2022-07-15 — End: ?

## 2022-07-15 MED ORDER — SUVOREXANT 10 MG TABLET
ORAL_TABLET | Freq: Every evening | ORAL | 1 refills | 90 days | Status: CP
Start: 2022-07-15 — End: ?

## 2022-07-15 MED ORDER — FUROSEMIDE 40 MG TABLET
ORAL_TABLET | Freq: Every evening | ORAL | 1 refills | 90 days | Status: CP
Start: 2022-07-15 — End: ?

## 2022-07-16 ENCOUNTER — Ambulatory Visit: Admit: 2022-07-16 | Discharge: 2022-07-17 | Payer: PRIVATE HEALTH INSURANCE

## 2022-07-16 ENCOUNTER — Institutional Professional Consult (permissible substitution): Admit: 2022-07-16 | Discharge: 2022-07-17 | Payer: PRIVATE HEALTH INSURANCE

## 2022-07-16 DIAGNOSIS — Z4509 Encounter for adjustment and management of other cardiac device: Principal | ICD-10-CM

## 2022-07-16 MED ORDER — SPIRONOLACTONE 100 MG TABLET
ORAL_TABLET | Freq: Every day | ORAL | 3 refills | 90 days | Status: CP
Start: 2022-07-16 — End: ?

## 2022-07-16 MED ORDER — TRIAMCINOLONE ACETONIDE 0.1 % TOPICAL CREAM
Freq: Once | TOPICAL | 3 refills | 0 days | Status: CP | PRN
Start: 2022-07-16 — End: ?

## 2022-07-21 DIAGNOSIS — E1165 Type 2 diabetes mellitus with hyperglycemia: Principal | ICD-10-CM

## 2022-07-21 DIAGNOSIS — Z794 Long term (current) use of insulin: Principal | ICD-10-CM

## 2022-07-21 MED ORDER — NOVOLOG FLEXPEN U-100 INSULIN ASPART 100 UNIT/ML (3 ML) SUBCUTANEOUS
3 refills | 0 days | Status: CP
Start: 2022-07-21 — End: ?

## 2022-07-23 ENCOUNTER — Ambulatory Visit
Admit: 2022-07-23 | Discharge: 2022-07-24 | Payer: PRIVATE HEALTH INSURANCE | Attending: Foot and Ankle Surgery | Primary: Foot and Ankle Surgery

## 2022-07-26 ENCOUNTER — Ambulatory Visit: Admit: 2022-07-26 | Discharge: 2022-07-27 | Payer: PRIVATE HEALTH INSURANCE

## 2022-07-26 DIAGNOSIS — F330 Major depressive disorder, recurrent, mild: Principal | ICD-10-CM

## 2022-07-26 DIAGNOSIS — Z794 Long term (current) use of insulin: Principal | ICD-10-CM

## 2022-07-26 DIAGNOSIS — E1165 Type 2 diabetes mellitus with hyperglycemia: Principal | ICD-10-CM

## 2022-07-26 DIAGNOSIS — I10 Essential (primary) hypertension: Principal | ICD-10-CM

## 2022-07-26 DIAGNOSIS — G4700 Insomnia, unspecified: Principal | ICD-10-CM

## 2022-07-26 DIAGNOSIS — E7800 Pure hypercholesterolemia, unspecified: Principal | ICD-10-CM

## 2022-07-26 MED ORDER — MONTELUKAST 10 MG TABLET
ORAL_TABLET | Freq: Every day | ORAL | 3 refills | 90 days | Status: CP | PRN
Start: 2022-07-26 — End: ?

## 2022-07-29 ENCOUNTER — Telehealth: Admit: 2022-07-29 | Discharge: 2022-07-30 | Payer: PRIVATE HEALTH INSURANCE | Attending: Clinical | Primary: Clinical

## 2022-07-29 DIAGNOSIS — F331 Major depressive disorder, recurrent, moderate: Principal | ICD-10-CM

## 2022-07-29 DIAGNOSIS — F439 Reaction to severe stress, unspecified: Principal | ICD-10-CM

## 2022-07-30 ENCOUNTER — Ambulatory Visit: Admit: 2022-07-30 | Payer: PRIVATE HEALTH INSURANCE

## 2022-07-30 ENCOUNTER — Ambulatory Visit: Admit: 2022-07-30 | Discharge: 2022-08-19 | Payer: PRIVATE HEALTH INSURANCE

## 2022-08-04 DIAGNOSIS — E1165 Type 2 diabetes mellitus with hyperglycemia: Principal | ICD-10-CM

## 2022-08-04 DIAGNOSIS — Z794 Long term (current) use of insulin: Principal | ICD-10-CM

## 2022-08-04 MED ORDER — OMNIPOD 5 G6 PODS (GEN 5) SUBCUTANEOUS CARTRIDGE
11 refills | 0 days | Status: CP
Start: 2022-08-04 — End: ?

## 2022-08-09 ENCOUNTER — Institutional Professional Consult (permissible substitution): Admit: 2022-08-09 | Discharge: 2022-08-10 | Payer: PRIVATE HEALTH INSURANCE

## 2022-08-12 ENCOUNTER — Ambulatory Visit
Admit: 2022-08-12 | Discharge: 2022-08-13 | Payer: PRIVATE HEALTH INSURANCE | Attending: Student in an Organized Health Care Education/Training Program | Primary: Student in an Organized Health Care Education/Training Program

## 2022-08-12 MED ORDER — TRIAMCINOLONE ACETONIDE 0.1 % TOPICAL OINTMENT
Freq: Two times a day (BID) | TOPICAL | 3 refills | 0.00000 days | Status: CP
Start: 2022-08-12 — End: 2023-08-12

## 2022-08-12 MED ORDER — CLOBETASOL 0.05 % TOPICAL OINTMENT
OPHTHALMIC | 1 refills | 0.00000 days | Status: CP
Start: 2022-08-12 — End: ?

## 2022-08-13 ENCOUNTER — Telehealth: Admit: 2022-08-13 | Discharge: 2022-08-14 | Payer: PRIVATE HEALTH INSURANCE | Attending: Clinical | Primary: Clinical

## 2022-08-14 ENCOUNTER — Ambulatory Visit: Admit: 2022-08-14 | Payer: PRIVATE HEALTH INSURANCE

## 2022-08-19 ENCOUNTER — Telehealth: Admit: 2022-08-19 | Discharge: 2022-08-20 | Payer: PRIVATE HEALTH INSURANCE

## 2022-08-19 DIAGNOSIS — G4700 Insomnia, unspecified: Principal | ICD-10-CM

## 2022-08-19 DIAGNOSIS — F411 Generalized anxiety disorder: Principal | ICD-10-CM

## 2022-08-19 DIAGNOSIS — F330 Major depressive disorder, recurrent, mild: Principal | ICD-10-CM

## 2022-08-20 ENCOUNTER — Ambulatory Visit: Admit: 2022-08-20 | Payer: PRIVATE HEALTH INSURANCE

## 2022-08-21 ENCOUNTER — Ambulatory Visit
Admit: 2022-08-21 | Discharge: 2022-08-22 | Payer: PRIVATE HEALTH INSURANCE | Attending: Student in an Organized Health Care Education/Training Program | Primary: Student in an Organized Health Care Education/Training Program

## 2022-08-21 DIAGNOSIS — E1165 Type 2 diabetes mellitus with hyperglycemia: Principal | ICD-10-CM

## 2022-08-21 DIAGNOSIS — Z794 Long term (current) use of insulin: Principal | ICD-10-CM

## 2022-08-21 MED ORDER — METFORMIN ER 500 MG TABLET,EXTENDED RELEASE 24 HR
ORAL_TABLET | Freq: Two times a day (BID) | ORAL | 3 refills | 90 days | Status: CP
Start: 2022-08-21 — End: ?

## 2022-08-21 MED ORDER — LIRAGLUTIDE 0.6 MG/0.1 ML (18 MG/3 ML) SUBCUTANEOUS PEN INJECTOR
SUBCUTANEOUS | 0 refills | 21 days | Status: CP
Start: 2022-08-21 — End: 2022-09-11

## 2022-08-22 ENCOUNTER — Encounter
Admit: 2022-08-22 | Discharge: 2022-08-22 | Payer: PRIVATE HEALTH INSURANCE | Attending: "Women's Health Care | Primary: "Women's Health Care

## 2022-08-22 ENCOUNTER — Ambulatory Visit
Admit: 2022-08-22 | Discharge: 2022-08-23 | Payer: PRIVATE HEALTH INSURANCE | Attending: "Women's Health Care | Primary: "Women's Health Care

## 2022-08-22 DIAGNOSIS — Z3041 Encounter for surveillance of contraceptive pills: Principal | ICD-10-CM

## 2022-08-22 DIAGNOSIS — R87810 Cervical high risk human papillomavirus (HPV) DNA test positive: Principal | ICD-10-CM

## 2022-08-22 DIAGNOSIS — Z8619 Personal history of other infectious and parasitic diseases: Principal | ICD-10-CM

## 2022-08-22 DIAGNOSIS — R350 Frequency of micturition: Principal | ICD-10-CM

## 2022-08-22 DIAGNOSIS — N898 Other specified noninflammatory disorders of vagina: Principal | ICD-10-CM

## 2022-08-22 DIAGNOSIS — Z113 Encounter for screening for infections with a predominantly sexual mode of transmission: Principal | ICD-10-CM

## 2022-08-22 MED ORDER — NORETHINDRONE (CONTRACEPTIVE) 0.35 MG TABLET
ORAL_TABLET | Freq: Every day | ORAL | 3 refills | 84.00000 days | Status: CP
Start: 2022-08-22 — End: ?

## 2022-08-26 MED ORDER — METRONIDAZOLE 500 MG TABLET
ORAL_TABLET | Freq: Two times a day (BID) | ORAL | 0 refills | 7.00000 days | Status: CP
Start: 2022-08-26 — End: 2022-09-02

## 2022-08-26 MED ORDER — AMPICILLIN 500 MG CAPSULE
ORAL_CAPSULE | Freq: Four times a day (QID) | ORAL | 0 refills | 7.00000 days | Status: CP
Start: 2022-08-26 — End: 2022-09-02

## 2022-08-26 MED ORDER — FLUCONAZOLE 150 MG TABLET
ORAL_TABLET | 0 refills | 0.00000 days | Status: CP
Start: 2022-08-26 — End: ?

## 2022-09-05 MED ORDER — CYCLOBENZAPRINE 10 MG TABLET
ORAL_TABLET | 1 refills | 0 days | Status: CP
Start: 2022-09-05 — End: ?

## 2022-09-06 DIAGNOSIS — R29898 Other symptoms and signs involving the musculoskeletal system: Principal | ICD-10-CM

## 2022-09-06 DIAGNOSIS — M21371 Foot drop, right foot: Principal | ICD-10-CM

## 2022-09-06 DIAGNOSIS — M21372 Foot drop, left foot: Principal | ICD-10-CM

## 2022-09-08 DIAGNOSIS — E1165 Type 2 diabetes mellitus with hyperglycemia: Principal | ICD-10-CM

## 2022-09-08 DIAGNOSIS — Z794 Long term (current) use of insulin: Principal | ICD-10-CM

## 2022-09-12 ENCOUNTER — Ambulatory Visit
Admit: 2022-09-12 | Discharge: 2022-09-18 | Disposition: A | Discharge status: Home / Self Care | Payer: PRIVATE HEALTH INSURANCE | Admitting: Student in an Organized Health Care Education/Training Program

## 2022-09-12 ENCOUNTER — Ambulatory Visit: Admit: 2022-09-12 | Discharge: 2022-09-18 | Payer: PRIVATE HEALTH INSURANCE

## 2022-09-16 DIAGNOSIS — E1165 Type 2 diabetes mellitus with hyperglycemia: Principal | ICD-10-CM

## 2022-09-16 DIAGNOSIS — Z794 Long term (current) use of insulin: Principal | ICD-10-CM

## 2022-09-16 MED ORDER — INSULIN ASPART U-100  100 UNIT/ML SUBCUTANEOUS SOLUTION
12 refills | 0 days | Status: CP
Start: 2022-09-16 — End: ?

## 2022-09-18 MED ORDER — POLYETHYLENE GLYCOL 3350 17 GRAM ORAL POWDER PACKET
PACK | Freq: Every day | ORAL | 0 refills | 30 days | Status: CP
Start: 2022-09-18 — End: 2022-10-18
  Filled 2022-09-18: qty 30, 30d supply, fill #0

## 2022-09-18 MED ORDER — METOPROLOL SUCCINATE ER 50 MG TABLET,EXTENDED RELEASE 24 HR
ORAL_TABLET | Freq: Every day | ORAL | 0 refills | 30 days | Status: CP
Start: 2022-09-18 — End: 2022-10-18
  Filled 2022-09-18: qty 30, 30d supply, fill #0

## 2022-09-18 MED ORDER — ATORVASTATIN 80 MG TABLET
ORAL | 0 refills | 30 days | Status: CP
Start: 2022-09-18 — End: 2022-10-18

## 2022-09-18 MED ORDER — LIRAGLUTIDE 0.6 MG/0.1 ML (18 MG/3 ML) SUBCUTANEOUS PEN INJECTOR
Freq: Every day | SUBCUTANEOUS | 11 refills | 30.00000 days | Status: CP
Start: 2022-09-18 — End: 2023-09-18

## 2022-09-18 MED ORDER — PROMETHAZINE 6.25 MG/5 ML ORAL SYRUP
Freq: Four times a day (QID) | ORAL | 0 refills | 12 days | Status: CP | PRN
Start: 2022-09-18 — End: 2022-10-03
  Filled 2022-09-18: qty 473, 12d supply, fill #0

## 2022-09-18 MED ORDER — ASPIRIN 81 MG CHEWABLE TABLET
ORAL_TABLET | Freq: Every day | ORAL | 0 refills | 30 days | Status: CP
Start: 2022-09-18 — End: 2022-10-18

## 2022-09-18 MED ORDER — AMLODIPINE 10 MG TABLET
ORAL | 0 refills | 90 days | Status: CP
Start: 2022-09-18 — End: 2022-12-17

## 2022-09-19 ENCOUNTER — Telehealth: Admit: 2022-09-19 | Discharge: 2022-09-20 | Payer: PRIVATE HEALTH INSURANCE | Attending: Clinical | Primary: Clinical

## 2022-09-19 DIAGNOSIS — E1165 Type 2 diabetes mellitus with hyperglycemia: Principal | ICD-10-CM

## 2022-09-19 DIAGNOSIS — Z794 Long term (current) use of insulin: Principal | ICD-10-CM

## 2022-09-19 MED ORDER — INSULIN ASPART U-100  100 UNIT/ML SUBCUTANEOUS SOLUTION
12 refills | 0 days | Status: CP
Start: 2022-09-19 — End: ?

## 2022-09-19 MED ORDER — OMNIPOD 5 G6 PODS (GEN 5) SUBCUTANEOUS CARTRIDGE
11 refills | 0 days | Status: CP
Start: 2022-09-19 — End: ?

## 2022-09-20 DIAGNOSIS — E1165 Type 2 diabetes mellitus with hyperglycemia: Principal | ICD-10-CM

## 2022-09-20 DIAGNOSIS — Z794 Long term (current) use of insulin: Principal | ICD-10-CM

## 2022-09-20 MED ORDER — OMNIPOD 5 G6 PODS (GEN 5) SUBCUTANEOUS CARTRIDGE
11 refills | 0 days | Status: CP
Start: 2022-09-20 — End: ?

## 2022-09-24 ENCOUNTER — Ambulatory Visit: Admit: 2022-09-24 | Discharge: 2022-09-25 | Payer: PRIVATE HEALTH INSURANCE

## 2022-09-24 DIAGNOSIS — K219 Gastro-esophageal reflux disease without esophagitis: Principal | ICD-10-CM

## 2022-09-24 DIAGNOSIS — E1165 Type 2 diabetes mellitus with hyperglycemia: Principal | ICD-10-CM

## 2022-09-24 DIAGNOSIS — K3184 Gastroparesis: Principal | ICD-10-CM

## 2022-09-24 DIAGNOSIS — K5901 Slow transit constipation: Principal | ICD-10-CM

## 2022-09-24 DIAGNOSIS — R058 Dry cough: Principal | ICD-10-CM

## 2022-09-24 DIAGNOSIS — Z794 Long term (current) use of insulin: Principal | ICD-10-CM

## 2022-09-24 DIAGNOSIS — I998 Other disorder of circulatory system: Principal | ICD-10-CM

## 2022-09-24 MED ORDER — OMEPRAZOLE 20 MG CAPSULE,DELAYED RELEASE
ORAL_CAPSULE | Freq: Every day | ORAL | 0 refills | 90 days | Status: CP
Start: 2022-09-24 — End: ?

## 2022-09-24 MED ORDER — PROMETHAZINE 6.25 MG/5 ML ORAL SYRUP
Freq: Four times a day (QID) | ORAL | 1 refills | 30 days | Status: CP | PRN
Start: 2022-09-24 — End: 2023-10-01

## 2022-09-26 ENCOUNTER — Telehealth: Admit: 2022-09-26 | Discharge: 2022-09-27 | Payer: PRIVATE HEALTH INSURANCE | Attending: Clinical | Primary: Clinical

## 2022-09-26 DIAGNOSIS — F439 Reaction to severe stress, unspecified: Principal | ICD-10-CM

## 2022-09-26 DIAGNOSIS — F331 Major depressive disorder, recurrent, moderate: Principal | ICD-10-CM

## 2022-09-27 ENCOUNTER — Ambulatory Visit: Admit: 2022-09-27 | Discharge: 2022-09-29 | Payer: PRIVATE HEALTH INSURANCE

## 2022-10-03 ENCOUNTER — Telehealth: Admit: 2022-10-03 | Discharge: 2022-10-04 | Payer: PRIVATE HEALTH INSURANCE | Attending: Clinical | Primary: Clinical

## 2022-10-03 DIAGNOSIS — F331 Major depressive disorder, recurrent, moderate: Principal | ICD-10-CM

## 2022-10-05 ENCOUNTER — Ambulatory Visit: Admit: 2022-10-05 | Payer: PRIVATE HEALTH INSURANCE

## 2022-10-11 DIAGNOSIS — G4700 Insomnia, unspecified: Principal | ICD-10-CM

## 2022-10-11 MED ORDER — SUVOREXANT 10 MG TABLET
ORAL_TABLET | Freq: Every evening | ORAL | 1 refills | 90 days | Status: CP
Start: 2022-10-11 — End: ?

## 2022-10-18 ENCOUNTER — Ambulatory Visit: Admit: 2022-10-18 | Discharge: 2022-10-19 | Payer: PRIVATE HEALTH INSURANCE

## 2022-10-18 ENCOUNTER — Telehealth: Admit: 2022-10-18 | Discharge: 2022-10-19 | Payer: PRIVATE HEALTH INSURANCE | Attending: Clinical | Primary: Clinical

## 2022-10-19 ENCOUNTER — Ambulatory Visit: Admit: 2022-10-19 | Payer: PRIVATE HEALTH INSURANCE

## 2022-10-22 ENCOUNTER — Ambulatory Visit
Admit: 2022-10-22 | Discharge: 2022-10-22 | Payer: PRIVATE HEALTH INSURANCE | Attending: Student in an Organized Health Care Education/Training Program | Primary: Student in an Organized Health Care Education/Training Program

## 2022-10-22 ENCOUNTER — Ambulatory Visit: Admit: 2022-10-22 | Discharge: 2022-10-22 | Payer: PRIVATE HEALTH INSURANCE

## 2022-10-22 DIAGNOSIS — K5900 Constipation, unspecified: Principal | ICD-10-CM

## 2022-10-22 DIAGNOSIS — E1165 Type 2 diabetes mellitus with hyperglycemia: Principal | ICD-10-CM

## 2022-10-22 DIAGNOSIS — K3184 Gastroparesis: Principal | ICD-10-CM

## 2022-10-22 DIAGNOSIS — Z794 Long term (current) use of insulin: Principal | ICD-10-CM

## 2022-10-24 ENCOUNTER — Ambulatory Visit: Admit: 2022-10-24 | Discharge: 2022-10-25 | Payer: PRIVATE HEALTH INSURANCE

## 2022-10-24 DIAGNOSIS — G4700 Insomnia, unspecified: Principal | ICD-10-CM

## 2022-10-24 MED ORDER — ACETAMINOPHEN 300 MG-CODEINE 30 MG TABLET
ORAL_TABLET | Freq: Four times a day (QID) | ORAL | 0 refills | 5 days | Status: CP
Start: 2022-10-24 — End: ?

## 2022-10-24 MED ORDER — SUVOREXANT 10 MG TABLET
ORAL_TABLET | Freq: Every evening | ORAL | 1 refills | 90 days | Status: CP
Start: 2022-10-24 — End: ?

## 2022-10-25 DIAGNOSIS — I2111 ST elevation (STEMI) myocardial infarction involving right coronary artery: Principal | ICD-10-CM

## 2022-10-25 DIAGNOSIS — G8929 Other chronic pain: Principal | ICD-10-CM

## 2022-10-25 DIAGNOSIS — N764 Abscess of vulva: Principal | ICD-10-CM

## 2022-10-25 DIAGNOSIS — L732 Hidradenitis suppurativa: Principal | ICD-10-CM

## 2022-10-25 MED ORDER — ATORVASTATIN 80 MG TABLET
ORAL_TABLET | ORAL | 3 refills | 90 days | Status: CP
Start: 2022-10-25 — End: 2023-11-27

## 2022-10-25 MED ORDER — METFORMIN ER 500 MG TABLET,EXTENDED RELEASE 24 HR
ORAL_TABLET | Freq: Two times a day (BID) | ORAL | 3 refills | 90 days | Status: CP
Start: 2022-10-25 — End: ?

## 2022-10-25 MED ORDER — EZETIMIBE 10 MG TABLET
ORAL_TABLET | Freq: Every day | ORAL | 3 refills | 90 days | Status: CP
Start: 2022-10-25 — End: 2023-10-25

## 2022-10-25 MED ORDER — OXYCODONE 5 MG TABLET
ORAL_TABLET | ORAL | 0 refills | 2 days | Status: CP | PRN
Start: 2022-10-25 — End: ?

## 2022-10-25 MED ORDER — MONTELUKAST 10 MG TABLET
ORAL_TABLET | Freq: Every day | ORAL | 3 refills | 90 days | Status: CP | PRN
Start: 2022-10-25 — End: ?

## 2022-10-25 MED ORDER — GABAPENTIN 300 MG CAPSULE
ORAL_CAPSULE | 1 refills | 0 days | Status: CP
Start: 2022-10-25 — End: ?

## 2022-10-25 MED ORDER — METOPROLOL SUCCINATE ER 50 MG TABLET,EXTENDED RELEASE 24 HR
ORAL_TABLET | Freq: Every day | ORAL | 3 refills | 90 days | Status: CP
Start: 2022-10-25 — End: 2023-11-28

## 2022-10-25 MED ORDER — FUROSEMIDE 40 MG TABLET
ORAL_TABLET | Freq: Every evening | ORAL | 1 refills | 90 days | Status: CP
Start: 2022-10-25 — End: ?

## 2022-10-25 MED ORDER — AMLODIPINE 10 MG TABLET
ORAL_TABLET | ORAL | 3 refills | 90 days | Status: CP
Start: 2022-10-25 — End: 2023-01-23

## 2022-10-25 MED ORDER — SPIRONOLACTONE 100 MG TABLET
ORAL_TABLET | Freq: Every day | ORAL | 3 refills | 90 days | Status: CP
Start: 2022-10-25 — End: ?

## 2022-10-25 MED ORDER — OMEPRAZOLE 20 MG CAPSULE,DELAYED RELEASE
ORAL_CAPSULE | Freq: Every day | ORAL | 0 refills | 90 days | Status: CP
Start: 2022-10-25 — End: ?

## 2022-10-25 MED ORDER — LOSARTAN 50 MG TABLET
ORAL_TABLET | ORAL | 3 refills | 90 days | Status: CP
Start: 2022-10-25 — End: 2023-11-24

## 2022-10-28 ENCOUNTER — Ambulatory Visit: Admit: 2022-10-28 | Discharge: 2022-10-29 | Payer: PRIVATE HEALTH INSURANCE

## 2022-10-28 DIAGNOSIS — K5900 Constipation, unspecified: Principal | ICD-10-CM

## 2022-10-28 DIAGNOSIS — N739 Female pelvic inflammatory disease, unspecified: Principal | ICD-10-CM

## 2022-10-28 DIAGNOSIS — F331 Major depressive disorder, recurrent, moderate: Principal | ICD-10-CM

## 2022-10-28 DIAGNOSIS — E7800 Pure hypercholesterolemia, unspecified: Principal | ICD-10-CM

## 2022-10-28 DIAGNOSIS — K5901 Slow transit constipation: Principal | ICD-10-CM

## 2022-10-31 ENCOUNTER — Ambulatory Visit: Admit: 2022-10-31 | Discharge: 2022-11-01 | Payer: PRIVATE HEALTH INSURANCE

## 2022-10-31 DIAGNOSIS — N764 Abscess of vulva: Principal | ICD-10-CM

## 2022-11-01 ENCOUNTER — Encounter
Admit: 2022-11-01 | Discharge: 2022-11-05 | Payer: PRIVATE HEALTH INSURANCE | Attending: Anesthesiology | Primary: Anesthesiology

## 2022-11-01 ENCOUNTER — Ambulatory Visit: Admit: 2022-11-01 | Discharge: 2022-11-05 | Payer: PRIVATE HEALTH INSURANCE

## 2022-11-01 MED ORDER — CLINDAMYCIN HCL 300 MG CAPSULE
ORAL_CAPSULE | Freq: Two times a day (BID) | ORAL | 0 refills | 7 days | Status: CP
Start: 2022-11-01 — End: 2022-11-08
  Filled 2022-11-01: qty 14, 7d supply, fill #0

## 2022-11-01 MED ORDER — OXYCODONE 5 MG TABLET
ORAL_TABLET | ORAL | 0 refills | 1 days | Status: CP | PRN
Start: 2022-11-01 — End: ?
  Filled 2022-11-01: qty 5, 1d supply, fill #0

## 2022-11-01 MED ORDER — ACETAMINOPHEN 325 MG TABLET
ORAL_TABLET | ORAL | 0 refills | 9 days | Status: CP | PRN
Start: 2022-11-01 — End: 2023-11-01
  Filled 2022-11-01: qty 100, 9d supply, fill #0

## 2022-11-04 DIAGNOSIS — Z09 Encounter for follow-up examination after completed treatment for conditions other than malignant neoplasm: Principal | ICD-10-CM

## 2022-11-04 DIAGNOSIS — G8929 Other chronic pain: Principal | ICD-10-CM

## 2022-11-04 MED ORDER — TRAMADOL 50 MG TABLET
ORAL_TABLET | Freq: Three times a day (TID) | ORAL | 0 refills | 2.00000 days | Status: CP | PRN
Start: 2022-11-04 — End: ?

## 2022-11-05 ENCOUNTER — Telehealth: Admit: 2022-11-05 | Discharge: 2022-11-06 | Payer: PRIVATE HEALTH INSURANCE | Attending: Clinical | Primary: Clinical

## 2022-11-05 DIAGNOSIS — F331 Major depressive disorder, recurrent, moderate: Principal | ICD-10-CM

## 2022-11-06 ENCOUNTER — Ambulatory Visit: Admit: 2022-11-06 | Discharge: 2022-11-07 | Payer: PRIVATE HEALTH INSURANCE

## 2022-11-06 DIAGNOSIS — H43393 Other vitreous opacities, bilateral: Principal | ICD-10-CM

## 2022-11-06 DIAGNOSIS — E1165 Type 2 diabetes mellitus with hyperglycemia: Principal | ICD-10-CM

## 2022-11-06 DIAGNOSIS — H579 Unspecified disorder of eye and adnexa: Principal | ICD-10-CM

## 2022-11-06 DIAGNOSIS — E113299 Type 2 diabetes mellitus with mild nonproliferative diabetic retinopathy without macular edema, unspecified eye: Principal | ICD-10-CM

## 2022-11-06 DIAGNOSIS — H3581 Retinal edema: Principal | ICD-10-CM

## 2022-11-06 DIAGNOSIS — Z794 Long term (current) use of insulin: Principal | ICD-10-CM

## 2022-11-07 ENCOUNTER — Ambulatory Visit: Admit: 2022-11-07 | Discharge: 2022-11-08 | Payer: PRIVATE HEALTH INSURANCE

## 2022-11-07 ENCOUNTER — Ambulatory Visit
Admit: 2022-11-07 | Discharge: 2022-11-07 | Disposition: A | Discharge status: Home / Self Care | Payer: PRIVATE HEALTH INSURANCE | Attending: Emergency Medicine

## 2022-11-07 ENCOUNTER — Emergency Department
Admit: 2022-11-07 | Discharge: 2022-11-07 | Disposition: A | Discharge status: Home / Self Care | Payer: PRIVATE HEALTH INSURANCE | Attending: Emergency Medicine

## 2022-11-07 DIAGNOSIS — Z09 Encounter for follow-up examination after completed treatment for conditions other than malignant neoplasm: Principal | ICD-10-CM

## 2022-11-07 DIAGNOSIS — N764 Abscess of vulva: Principal | ICD-10-CM

## 2022-11-07 MED ORDER — TRAMADOL 50 MG TABLET
ORAL_TABLET | Freq: Three times a day (TID) | ORAL | 0 refills | 4 days | Status: CP | PRN
Start: 2022-11-07 — End: ?

## 2022-11-14 ENCOUNTER — Ambulatory Visit: Admit: 2022-11-14 | Discharge: 2022-11-15 | Payer: PRIVATE HEALTH INSURANCE

## 2022-11-14 DIAGNOSIS — Z09 Encounter for follow-up examination after completed treatment for conditions other than malignant neoplasm: Principal | ICD-10-CM

## 2022-11-18 ENCOUNTER — Ambulatory Visit
Admit: 2022-11-18 | Discharge: 2022-11-19 | Payer: PRIVATE HEALTH INSURANCE | Attending: Student in an Organized Health Care Education/Training Program | Primary: Student in an Organized Health Care Education/Training Program

## 2022-11-18 DIAGNOSIS — M21372 Foot drop, left foot: Principal | ICD-10-CM

## 2022-11-18 DIAGNOSIS — M21371 Foot drop, right foot: Principal | ICD-10-CM

## 2022-11-18 DIAGNOSIS — R29898 Other symptoms and signs involving the musculoskeletal system: Principal | ICD-10-CM

## 2022-11-19 ENCOUNTER — Telehealth: Admit: 2022-11-19 | Discharge: 2022-11-20 | Payer: PRIVATE HEALTH INSURANCE | Attending: Clinical | Primary: Clinical

## 2022-11-21 ENCOUNTER — Ambulatory Visit: Admit: 2022-11-21 | Discharge: 2022-11-22 | Payer: PRIVATE HEALTH INSURANCE

## 2022-11-21 DIAGNOSIS — N764 Abscess of vulva: Principal | ICD-10-CM

## 2022-11-21 DIAGNOSIS — L732 Hidradenitis suppurativa: Principal | ICD-10-CM

## 2022-11-21 MED ORDER — DOXYCYCLINE HYCLATE 100 MG TABLET
ORAL_TABLET | Freq: Every day | ORAL | 1 refills | 30.00000 days | Status: CP
Start: 2022-11-21 — End: 2022-12-21

## 2022-11-21 MED ORDER — CLINDAMYCIN PHOSPHATE 1 % TOPICAL SOLUTION
Freq: Two times a day (BID) | TOPICAL | 1 refills | 0.00000 days | Status: CP
Start: 2022-11-21 — End: 2023-11-21

## 2022-11-22 ENCOUNTER — Ambulatory Visit
Admit: 2022-11-22 | Discharge: 2022-11-23 | Payer: PRIVATE HEALTH INSURANCE | Attending: Student in an Organized Health Care Education/Training Program | Primary: Student in an Organized Health Care Education/Training Program

## 2022-11-22 DIAGNOSIS — Z794 Long term (current) use of insulin: Principal | ICD-10-CM

## 2022-11-22 DIAGNOSIS — I10 Essential (primary) hypertension: Principal | ICD-10-CM

## 2022-11-22 DIAGNOSIS — E1165 Type 2 diabetes mellitus with hyperglycemia: Principal | ICD-10-CM

## 2022-11-22 MED ORDER — INSULIN ASPART U-100  100 UNIT/ML SUBCUTANEOUS SOLUTION
12 refills | 0 days | Status: CP
Start: 2022-11-22 — End: ?

## 2022-11-22 MED ORDER — INSULIN DEGLUDEC (U-100) 100 UNIT/ML (3 ML) SUBCUTANEOUS PEN
12 refills | 0 days | Status: CP
Start: 2022-11-22 — End: ?

## 2022-11-22 MED ORDER — OMNIPOD 5 G6 PODS (GEN 5) SUBCUTANEOUS CARTRIDGE
11 refills | 0 days | Status: CP
Start: 2022-11-22 — End: ?

## 2022-11-22 MED ORDER — DEXAMETHASONE 1 MG TABLET
ORAL_TABLET | Freq: Two times a day (BID) | ORAL | 0 refills | 1 days | Status: CP
Start: 2022-11-22 — End: ?

## 2022-11-22 MED ORDER — METFORMIN ER 500 MG TABLET,EXTENDED RELEASE 24 HR
ORAL_TABLET | Freq: Two times a day (BID) | ORAL | 3 refills | 90 days | Status: CP
Start: 2022-11-22 — End: ?

## 2022-11-22 MED ORDER — INSULIN ASPART (U-100) 100 UNIT/ML (3 ML) SUBCUTANEOUS PEN
12 refills | 0 days | Status: CP
Start: 2022-11-22 — End: ?

## 2022-11-23 ENCOUNTER — Ambulatory Visit: Admit: 2022-11-23 | Discharge: 2022-12-17 | Payer: PRIVATE HEALTH INSURANCE

## 2022-11-23 ENCOUNTER — Ambulatory Visit: Admit: 2022-11-23 | Payer: PRIVATE HEALTH INSURANCE

## 2022-11-28 ENCOUNTER — Ambulatory Visit: Admit: 2022-11-28 | Discharge: 2022-11-29 | Payer: PRIVATE HEALTH INSURANCE

## 2022-12-03 ENCOUNTER — Ambulatory Visit: Admit: 2022-12-03 | Discharge: 2022-12-03 | Payer: PRIVATE HEALTH INSURANCE | Attending: Clinical | Primary: Clinical

## 2022-12-03 ENCOUNTER — Ambulatory Visit: Admit: 2022-12-03 | Discharge: 2022-12-03 | Payer: PRIVATE HEALTH INSURANCE

## 2022-12-03 DIAGNOSIS — Z09 Encounter for follow-up examination after completed treatment for conditions other than malignant neoplasm: Principal | ICD-10-CM

## 2022-12-03 DIAGNOSIS — N764 Abscess of vulva: Principal | ICD-10-CM

## 2022-12-03 DIAGNOSIS — Z4509 Encounter for adjustment and management of other cardiac device: Principal | ICD-10-CM

## 2022-12-03 DIAGNOSIS — I2510 Atherosclerotic heart disease of native coronary artery without angina pectoris: Principal | ICD-10-CM

## 2022-12-03 DIAGNOSIS — R079 Chest pain, unspecified: Principal | ICD-10-CM

## 2022-12-03 MED ORDER — ASPIRIN 81 MG CHEWABLE TABLET
ORAL_TABLET | Freq: Every day | ORAL | 3 refills | 90 days | Status: CP
Start: 2022-12-03 — End: 2023-01-02

## 2022-12-03 MED ORDER — AMLODIPINE 10 MG TABLET
ORAL_TABLET | Freq: Every day | ORAL | 3 refills | 180 days | Status: CP
Start: 2022-12-03 — End: 2023-03-03

## 2022-12-04 DIAGNOSIS — N764 Abscess of vulva: Principal | ICD-10-CM

## 2022-12-04 DIAGNOSIS — Z09 Encounter for follow-up examination after completed treatment for conditions other than malignant neoplasm: Principal | ICD-10-CM

## 2022-12-04 MED ORDER — TRAMADOL 50 MG TABLET
ORAL_TABLET | Freq: Three times a day (TID) | ORAL | 0 refills | 4 days | Status: CP | PRN
Start: 2022-12-04 — End: ?

## 2022-12-10 ENCOUNTER — Telehealth: Admit: 2022-12-10 | Discharge: 2022-12-11 | Payer: PRIVATE HEALTH INSURANCE | Attending: Clinical | Primary: Clinical

## 2022-12-12 ENCOUNTER — Ambulatory Visit: Admit: 2022-12-12 | Discharge: 2022-12-13 | Payer: PRIVATE HEALTH INSURANCE

## 2022-12-23 ENCOUNTER — Ambulatory Visit: Admit: 2022-12-23 | Discharge: 2022-12-24 | Payer: PRIVATE HEALTH INSURANCE

## 2022-12-23 ENCOUNTER — Telehealth: Admit: 2022-12-23 | Discharge: 2022-12-24 | Payer: PRIVATE HEALTH INSURANCE | Attending: Clinical | Primary: Clinical

## 2022-12-23 DIAGNOSIS — R079 Chest pain, unspecified: Principal | ICD-10-CM

## 2022-12-23 DIAGNOSIS — I2510 Atherosclerotic heart disease of native coronary artery without angina pectoris: Principal | ICD-10-CM

## 2022-12-26 ENCOUNTER — Ambulatory Visit: Admit: 2022-12-26 | Discharge: 2022-12-27 | Payer: PRIVATE HEALTH INSURANCE

## 2022-12-26 DIAGNOSIS — K224 Dyskinesia of esophagus: Principal | ICD-10-CM

## 2022-12-26 DIAGNOSIS — R079 Chest pain, unspecified: Principal | ICD-10-CM

## 2022-12-26 MED ORDER — ASPIRIN 81 MG CHEWABLE TABLET
ORAL_TABLET | Freq: Every day | ORAL | 3 refills | 90.00 days | Status: CP
Start: 2022-12-26 — End: 2023-01-25

## 2022-12-26 MED ORDER — NITROGLYCERIN 0.3 MG SUBLINGUAL TABLET
ORAL_TABLET | SUBLINGUAL | 1 refills | 1.00 days | Status: CP | PRN
Start: 2022-12-26 — End: ?

## 2022-12-31 ENCOUNTER — Ambulatory Visit
Admit: 2022-12-31 | Discharge: 2023-01-01 | Payer: PRIVATE HEALTH INSURANCE | Attending: Student in an Organized Health Care Education/Training Program | Primary: Student in an Organized Health Care Education/Training Program

## 2022-12-31 ENCOUNTER — Ambulatory Visit: Admit: 2022-12-31 | Payer: PRIVATE HEALTH INSURANCE

## 2022-12-31 DIAGNOSIS — M21371 Foot drop, right foot: Principal | ICD-10-CM

## 2022-12-31 DIAGNOSIS — R29898 Other symptoms and signs involving the musculoskeletal system: Principal | ICD-10-CM

## 2022-12-31 DIAGNOSIS — M21372 Foot drop, left foot: Principal | ICD-10-CM

## 2023-01-14 ENCOUNTER — Encounter: Admit: 2023-01-14 | Discharge: 2023-01-14 | Payer: PRIVATE HEALTH INSURANCE

## 2023-01-14 ENCOUNTER — Inpatient Hospital Stay: Admit: 2023-01-14 | Discharge: 2023-01-14 | Payer: PRIVATE HEALTH INSURANCE

## 2023-01-16 ENCOUNTER — Emergency Department
Admit: 2023-01-16 | Discharge: 2023-01-17 | Disposition: A | Discharge status: Home / Self Care | Payer: PRIVATE HEALTH INSURANCE

## 2023-01-17 DIAGNOSIS — E1165 Type 2 diabetes mellitus with hyperglycemia: Principal | ICD-10-CM

## 2023-01-17 DIAGNOSIS — Z794 Long term (current) use of insulin: Principal | ICD-10-CM

## 2023-01-17 MED ORDER — DEXCOM G6 SENSOR DEVICE
0 refills | 0.00 days
Start: 2023-01-17 — End: ?

## 2023-01-17 MED ORDER — NITROFURANTOIN MONOHYDRATE/MACROCRYSTALS 100 MG CAPSULE
ORAL_CAPSULE | Freq: Two times a day (BID) | ORAL | 0 refills | 7.00 days | Status: CP
Start: 2023-01-17 — End: 2023-01-24

## 2023-01-18 ENCOUNTER — Ambulatory Visit: Admit: 2023-01-18 | Payer: PRIVATE HEALTH INSURANCE

## 2023-01-21 MED ORDER — PROMETHAZINE 25 MG TABLET
ORAL_TABLET | Freq: Two times a day (BID) | ORAL | 1 refills | 30.00 days | Status: CP | PRN
Start: 2023-01-21 — End: ?

## 2023-01-21 MED ORDER — DEXCOM G6 SENSOR DEVICE
0 refills | 0.00 days | Status: CP
Start: 2023-01-21 — End: ?

## 2023-01-24 ENCOUNTER — Ambulatory Visit: Admit: 2023-01-24 | Discharge: 2023-01-25 | Payer: PRIVATE HEALTH INSURANCE | Attending: Clinical | Primary: Clinical

## 2023-01-24 DIAGNOSIS — F331 Major depressive disorder, recurrent, moderate: Principal | ICD-10-CM

## 2023-01-24 DIAGNOSIS — F439 Reaction to severe stress, unspecified: Principal | ICD-10-CM

## 2023-01-30 ENCOUNTER — Ambulatory Visit: Admit: 2023-01-30 | Discharge: 2023-01-31 | Payer: PRIVATE HEALTH INSURANCE

## 2023-01-30 DIAGNOSIS — G4700 Insomnia, unspecified: Principal | ICD-10-CM

## 2023-01-30 DIAGNOSIS — M21371 Foot drop, right foot: Principal | ICD-10-CM

## 2023-01-30 DIAGNOSIS — I2510 Atherosclerotic heart disease of native coronary artery without angina pectoris: Principal | ICD-10-CM

## 2023-01-30 DIAGNOSIS — R29898 Other symptoms and signs involving the musculoskeletal system: Principal | ICD-10-CM

## 2023-01-30 DIAGNOSIS — M21372 Foot drop, left foot: Principal | ICD-10-CM

## 2023-01-30 DIAGNOSIS — Z794 Long term (current) use of insulin: Principal | ICD-10-CM

## 2023-01-30 DIAGNOSIS — R5383 Other fatigue: Principal | ICD-10-CM

## 2023-01-30 DIAGNOSIS — R3915 Urgency of urination: Principal | ICD-10-CM

## 2023-01-30 DIAGNOSIS — E7800 Pure hypercholesterolemia, unspecified: Principal | ICD-10-CM

## 2023-01-30 DIAGNOSIS — F331 Major depressive disorder, recurrent, moderate: Principal | ICD-10-CM

## 2023-01-30 DIAGNOSIS — G629 Polyneuropathy, unspecified: Principal | ICD-10-CM

## 2023-01-30 DIAGNOSIS — K5901 Slow transit constipation: Principal | ICD-10-CM

## 2023-01-30 DIAGNOSIS — I10 Essential (primary) hypertension: Principal | ICD-10-CM

## 2023-01-30 DIAGNOSIS — E1165 Type 2 diabetes mellitus with hyperglycemia: Principal | ICD-10-CM

## 2023-01-31 ENCOUNTER — Ambulatory Visit
Admit: 2023-01-31 | Discharge: 2023-02-06 | Disposition: A | Discharge status: Home / Self Care | Payer: PRIVATE HEALTH INSURANCE | Admitting: Family Medicine

## 2023-01-31 ENCOUNTER — Ambulatory Visit: Admit: 2023-01-31 | Discharge: 2023-02-06 | Payer: PRIVATE HEALTH INSURANCE

## 2023-01-31 ENCOUNTER — Inpatient Hospital Stay
Admit: 2023-01-31 | Discharge: 2023-02-06 | Disposition: A | Discharge status: Home / Self Care | Payer: PRIVATE HEALTH INSURANCE | Admitting: Family Medicine

## 2023-02-06 MED ORDER — ERGOCALCIFEROL (VITAMIN D2) 1,250 MCG (50,000 UNIT) CAPSULE
ORAL_CAPSULE | ORAL | 0 refills | 56.00 days | Status: CP
Start: 2023-02-06 — End: 2024-02-06

## 2023-02-10 ENCOUNTER — Ambulatory Visit: Admit: 2023-02-10 | Discharge: 2023-02-11 | Payer: PRIVATE HEALTH INSURANCE

## 2023-02-10 DIAGNOSIS — L97521 Non-pressure chronic ulcer of other part of left foot limited to breakdown of skin: Principal | ICD-10-CM

## 2023-02-10 DIAGNOSIS — E11621 Type 2 diabetes mellitus with foot ulcer: Principal | ICD-10-CM

## 2023-02-10 DIAGNOSIS — L97519 Non-pressure chronic ulcer of other part of right foot with unspecified severity: Principal | ICD-10-CM

## 2023-02-12 MED ORDER — CONTRAST ALLERGY PREMED PACK
0 refills | 0 days | Status: CP
Start: 2023-02-12 — End: ?

## 2023-02-13 DIAGNOSIS — E11621 Type 2 diabetes mellitus with foot ulcer: Principal | ICD-10-CM

## 2023-02-13 DIAGNOSIS — L97519 Non-pressure chronic ulcer of other part of right foot with unspecified severity: Principal | ICD-10-CM

## 2023-02-13 MED ORDER — DOXYCYCLINE HYCLATE 100 MG TABLET
ORAL_TABLET | Freq: Two times a day (BID) | ORAL | 0 refills | 14.00 days | Status: CP
Start: 2023-02-13 — End: 2023-02-27

## 2023-02-14 ENCOUNTER — Ambulatory Visit: Admit: 2023-02-14 | Discharge: 2023-02-15 | Payer: PRIVATE HEALTH INSURANCE | Attending: Clinical | Primary: Clinical

## 2023-02-14 ENCOUNTER — Ambulatory Visit: Admit: 2023-02-14 | Discharge: 2023-02-15 | Payer: PRIVATE HEALTH INSURANCE

## 2023-02-14 ENCOUNTER — Inpatient Hospital Stay: Admit: 2023-02-14 | Discharge: 2023-02-15 | Payer: PRIVATE HEALTH INSURANCE

## 2023-02-14 DIAGNOSIS — E11621 Type 2 diabetes mellitus with foot ulcer: Principal | ICD-10-CM

## 2023-02-14 DIAGNOSIS — K3184 Gastroparesis: Principal | ICD-10-CM

## 2023-02-14 DIAGNOSIS — G63 Polyneuropathy in diseases classified elsewhere: Principal | ICD-10-CM

## 2023-02-14 DIAGNOSIS — E1110 Type 2 diabetes mellitus with ketoacidosis without coma: Principal | ICD-10-CM

## 2023-02-14 DIAGNOSIS — L97416 Non-pressure chronic ulcer of right heel and midfoot with bone involvement without evidence of necrosis: Principal | ICD-10-CM

## 2023-02-14 DIAGNOSIS — E559 Vitamin D deficiency, unspecified: Principal | ICD-10-CM

## 2023-02-14 DIAGNOSIS — R600 Localized edema: Principal | ICD-10-CM

## 2023-02-17 ENCOUNTER — Encounter: Admit: 2023-02-17 | Discharge: 2023-02-18 | Payer: PRIVATE HEALTH INSURANCE | Attending: Clinical | Primary: Clinical

## 2023-02-18 ENCOUNTER — Ambulatory Visit
Admit: 2023-02-18 | Discharge: 2023-02-19 | Payer: PRIVATE HEALTH INSURANCE | Attending: Acute Care | Primary: Acute Care

## 2023-02-18 ENCOUNTER — Ambulatory Visit: Admit: 2023-02-18 | Discharge: 2023-02-19 | Payer: PRIVATE HEALTH INSURANCE

## 2023-02-18 DIAGNOSIS — M21371 Foot drop, right foot: Principal | ICD-10-CM

## 2023-02-18 DIAGNOSIS — E11621 Type 2 diabetes mellitus with foot ulcer: Principal | ICD-10-CM

## 2023-02-18 DIAGNOSIS — R29898 Other symptoms and signs involving the musculoskeletal system: Principal | ICD-10-CM

## 2023-02-18 DIAGNOSIS — E08621 Diabetes mellitus due to underlying condition with foot ulcer: Principal | ICD-10-CM

## 2023-02-18 DIAGNOSIS — M792 Neuralgia and neuritis, unspecified: Principal | ICD-10-CM

## 2023-02-18 DIAGNOSIS — L97521 Non-pressure chronic ulcer of other part of left foot limited to breakdown of skin: Principal | ICD-10-CM

## 2023-02-18 DIAGNOSIS — L97519 Non-pressure chronic ulcer of other part of right foot with unspecified severity: Principal | ICD-10-CM

## 2023-02-18 DIAGNOSIS — L97411 Non-pressure chronic ulcer of right heel and midfoot limited to breakdown of skin: Principal | ICD-10-CM

## 2023-02-18 DIAGNOSIS — M21372 Foot drop, left foot: Principal | ICD-10-CM

## 2023-02-18 DIAGNOSIS — Z8639 Personal history of other endocrine, nutritional and metabolic disease: Principal | ICD-10-CM

## 2023-02-18 MED ORDER — SILVER SULFADIAZINE 1 % TOPICAL CREAM
Freq: Every day | TOPICAL | 3 refills | 0.00 days | Status: CP
Start: 2023-02-18 — End: 2024-02-18

## 2023-02-18 MED ORDER — GABAPENTIN 300 MG CAPSULE
ORAL_CAPSULE | 3 refills | 0.00 days | Status: CP
Start: 2023-02-18 — End: ?

## 2023-02-20 MED ORDER — NEXLETOL 180 MG TABLET
ORAL_TABLET | Freq: Every day | ORAL | 0 refills | 0.00 days | Status: CP
Start: 2023-02-20 — End: ?

## 2023-02-23 MED ORDER — HYDROXYZINE HCL 25 MG TABLET
ORAL_TABLET | Freq: Every evening | ORAL | 0 refills | 30.00 days | Status: CP | PRN
Start: 2023-02-23 — End: ?

## 2023-02-24 ENCOUNTER — Ambulatory Visit: Admit: 2023-02-24 | Discharge: 2023-02-25 | Payer: PRIVATE HEALTH INSURANCE

## 2023-02-24 DIAGNOSIS — E109 Type 1 diabetes mellitus without complications: Principal | ICD-10-CM

## 2023-02-25 ENCOUNTER — Inpatient Hospital Stay
Admit: 2023-02-25 | Discharge: 2023-02-26 | Payer: PRIVATE HEALTH INSURANCE | Attending: Student in an Organized Health Care Education/Training Program | Primary: Student in an Organized Health Care Education/Training Program

## 2023-02-25 ENCOUNTER — Inpatient Hospital Stay
Admit: 2023-02-25 | Discharge: 2023-02-26 | Payer: PRIVATE HEALTH INSURANCE | Attending: Physical Medicine & Rehabilitation | Primary: Physical Medicine & Rehabilitation

## 2023-02-25 DIAGNOSIS — G5610 Other lesions of median nerve, unspecified upper limb: Principal | ICD-10-CM

## 2023-02-25 DIAGNOSIS — G603 Idiopathic progressive neuropathy: Principal | ICD-10-CM

## 2023-02-25 DIAGNOSIS — G5623 Lesion of ulnar nerve, bilateral upper limbs: Principal | ICD-10-CM

## 2023-02-28 ENCOUNTER — Inpatient Hospital Stay: Admit: 2023-02-28 | Discharge: 2023-03-01 | Payer: PRIVATE HEALTH INSURANCE

## 2023-03-05 DIAGNOSIS — E1165 Type 2 diabetes mellitus with hyperglycemia: Principal | ICD-10-CM

## 2023-03-05 DIAGNOSIS — Z794 Long term (current) use of insulin: Principal | ICD-10-CM

## 2023-03-05 MED ORDER — LIRAGLUTIDE 0.6 MG/0.1 ML (18 MG/3 ML) SUBCUTANEOUS PEN INJECTOR
SUBCUTANEOUS | 0 refills | 21.00 days | Status: CP
Start: 2023-03-05 — End: 2023-03-26

## 2023-03-05 MED ORDER — DEXCOM G7 SENSOR DEVICE
3 refills | 0.00 days | Status: CP
Start: 2023-03-05 — End: ?

## 2023-03-06 ENCOUNTER — Ambulatory Visit
Admit: 2023-03-06 | Discharge: 2023-03-06 | Disposition: A | Discharge status: Home / Self Care | Payer: PRIVATE HEALTH INSURANCE | Primary: Student in an Organized Health Care Education/Training Program

## 2023-03-06 ENCOUNTER — Emergency Department
Admit: 2023-03-06 | Discharge: 2023-03-06 | Disposition: A | Discharge status: Home / Self Care | Payer: PRIVATE HEALTH INSURANCE

## 2023-03-07 ENCOUNTER — Ambulatory Visit: Admit: 2023-03-07 | Discharge: 2023-03-08 | Payer: PRIVATE HEALTH INSURANCE

## 2023-03-13 ENCOUNTER — Ambulatory Visit: Admit: 2023-03-13 | Discharge: 2023-03-14 | Payer: PRIVATE HEALTH INSURANCE

## 2023-03-13 DIAGNOSIS — L97519 Non-pressure chronic ulcer of other part of right foot with unspecified severity: Principal | ICD-10-CM

## 2023-03-13 DIAGNOSIS — L97521 Non-pressure chronic ulcer of other part of left foot limited to breakdown of skin: Principal | ICD-10-CM

## 2023-03-13 DIAGNOSIS — E11621 Type 2 diabetes mellitus with foot ulcer: Principal | ICD-10-CM

## 2023-03-13 DIAGNOSIS — I739 Peripheral vascular disease, unspecified: Principal | ICD-10-CM

## 2023-03-13 DIAGNOSIS — I872 Venous insufficiency (chronic) (peripheral): Principal | ICD-10-CM

## 2023-03-13 MED ORDER — AMMONIUM LACTATE 12 % TOPICAL CREAM
Freq: Every day | TOPICAL | 11 refills | 0.00 days | Status: CP
Start: 2023-03-13 — End: ?

## 2023-03-14 ENCOUNTER — Inpatient Hospital Stay: Admit: 2023-03-14 | Discharge: 2023-03-15 | Payer: PRIVATE HEALTH INSURANCE

## 2023-03-14 DIAGNOSIS — I739 Peripheral vascular disease, unspecified: Principal | ICD-10-CM

## 2023-03-14 DIAGNOSIS — E11621 Type 2 diabetes mellitus with foot ulcer: Principal | ICD-10-CM

## 2023-03-14 DIAGNOSIS — L97519 Non-pressure chronic ulcer of other part of right foot with unspecified severity: Principal | ICD-10-CM

## 2023-03-14 DIAGNOSIS — I872 Venous insufficiency (chronic) (peripheral): Principal | ICD-10-CM

## 2023-03-14 DIAGNOSIS — L97521 Non-pressure chronic ulcer of other part of left foot limited to breakdown of skin: Principal | ICD-10-CM

## 2023-03-18 ENCOUNTER — Ambulatory Visit: Admit: 2023-03-18 | Payer: PRIVATE HEALTH INSURANCE

## 2023-03-18 ENCOUNTER — Encounter: Admit: 2023-03-18 | Payer: PRIVATE HEALTH INSURANCE

## 2023-03-18 ENCOUNTER — Ambulatory Visit: Admit: 2023-03-18 | Discharge: 2023-03-27 | Payer: PRIVATE HEALTH INSURANCE

## 2023-03-18 ENCOUNTER — Inpatient Hospital Stay
Admit: 2023-03-18 | Discharge: 2023-03-27 | Disposition: A | Discharge status: Home / Self Care | Payer: PRIVATE HEALTH INSURANCE

## 2023-03-18 ENCOUNTER — Encounter: Admit: 2023-03-18 | Payer: PRIVATE HEALTH INSURANCE | Attending: Internal Medicine

## 2023-03-18 ENCOUNTER — Ambulatory Visit
Admit: 2023-03-18 | Discharge: 2023-03-27 | Disposition: A | Discharge status: Home / Self Care | Payer: PRIVATE HEALTH INSURANCE

## 2023-03-19 MED ORDER — ICOSAPENT ETHYL 1 GRAM CAPSULE
ORAL_CAPSULE | Freq: Two times a day (BID) | ORAL | 0 refills | 30.00 days | Status: CP
Start: 2023-03-19 — End: 2023-03-19

## 2023-03-19 MED ORDER — XARELTO 2.5 MG TABLET
ORAL_TABLET | Freq: Two times a day (BID) | ORAL | 0 refills | 30.00 days | Status: CP
Start: 2023-03-19 — End: 2023-03-19

## 2023-03-19 MED ORDER — EVOLOCUMAB 140 MG/ML SUBCUTANEOUS SYRINGE
SUBCUTANEOUS | 0 refills | 28.00 days | Status: CP
Start: 2023-03-19 — End: 2023-03-19

## 2023-03-26 DIAGNOSIS — I739 Peripheral vascular disease, unspecified: Principal | ICD-10-CM

## 2023-03-26 MED ORDER — NICOTINE (POLACRILEX) 2 MG BUCCAL LOZENGE
BUCCAL | 5 refills | 3.00 days | Status: CP | PRN
Start: 2023-03-26 — End: 2023-04-25
  Filled 2023-03-26: qty 72, 3d supply, fill #0

## 2023-03-26 MED ORDER — EVOLOCUMAB 140 MG/ML SUBCUTANEOUS PEN INJECTOR
SUBCUTANEOUS | 3 refills | 0.00 days | Status: CP
Start: 2023-03-26 — End: 2023-04-25

## 2023-03-26 MED ORDER — LOSARTAN 25 MG TABLET
ORAL_TABLET | Freq: Every day | ORAL | 3 refills | 90 days | Status: CP
Start: 2023-03-26 — End: 2023-06-24
  Filled 2023-03-26: qty 45, 90d supply, fill #0

## 2023-03-27 DIAGNOSIS — Z794 Long term (current) use of insulin: Principal | ICD-10-CM

## 2023-03-27 DIAGNOSIS — I739 Peripheral vascular disease, unspecified: Principal | ICD-10-CM

## 2023-03-27 DIAGNOSIS — E1151 Type 2 diabetes mellitus with diabetic peripheral angiopathy without gangrene: Principal | ICD-10-CM

## 2023-03-27 DIAGNOSIS — I252 Old myocardial infarction: Principal | ICD-10-CM

## 2023-03-27 DIAGNOSIS — L732 Hidradenitis suppurativa: Principal | ICD-10-CM

## 2023-03-27 DIAGNOSIS — R42 Dizziness and giddiness: Principal | ICD-10-CM

## 2023-03-27 MED ORDER — SPIRONOLACTONE 25 MG TABLET
ORAL_TABLET | Freq: Every day | ORAL | 2 refills | 30.00 days | Status: CP
Start: 2023-03-27 — End: 2023-06-25
  Filled 2023-03-27: qty 30, 30d supply, fill #0

## 2023-03-31 ENCOUNTER — Encounter: Admit: 2023-03-31 | Discharge: 2023-04-01 | Payer: PRIVATE HEALTH INSURANCE | Attending: Clinical | Primary: Clinical

## 2023-04-02 MED ORDER — LIRAGLUTIDE 0.6 MG/0.1 ML (18 MG/3 ML) SUBCUTANEOUS PEN INJECTOR
Freq: Every day | SUBCUTANEOUS | 11 refills | 30.00 days | Status: CP
Start: 2023-04-02 — End: 2024-04-01

## 2023-04-16 ENCOUNTER — Ambulatory Visit: Admit: 2023-04-16 | Discharge: 2023-04-17

## 2023-04-16 DIAGNOSIS — E11621 Type 2 diabetes mellitus with foot ulcer: Principal | ICD-10-CM

## 2023-04-16 DIAGNOSIS — L97519 Non-pressure chronic ulcer of other part of right foot with unspecified severity: Principal | ICD-10-CM

## 2023-04-16 DIAGNOSIS — I739 Peripheral vascular disease, unspecified: Principal | ICD-10-CM

## 2023-04-16 DIAGNOSIS — L97521 Non-pressure chronic ulcer of other part of left foot limited to breakdown of skin: Principal | ICD-10-CM

## 2023-04-19 MED ORDER — OMEPRAZOLE 20 MG CAPSULE,DELAYED RELEASE
ORAL_CAPSULE | 0 refills | 0.00 days
Start: 2023-04-19 — End: ?

## 2023-04-22 MED ORDER — OMEPRAZOLE 20 MG CAPSULE,DELAYED RELEASE
ORAL_CAPSULE | 0 refills | 0.00 days | Status: CP
Start: 2023-04-22 — End: ?

## 2023-04-24 ENCOUNTER — Encounter
Admit: 2023-04-24 | Discharge: 2023-04-24 | Payer: Medicaid (Managed Care) | Attending: Internal Medicine | Primary: Internal Medicine

## 2023-04-24 ENCOUNTER — Ambulatory Visit: Admit: 2023-04-24 | Discharge: 2023-04-24 | Payer: Medicaid (Managed Care)

## 2023-04-26 ENCOUNTER — Encounter
Admit: 2023-04-26 | Discharge: 2023-05-02 | Disposition: A | Discharge status: Home Health | Payer: Medicaid (Managed Care) | Attending: Cardiovascular Disease | Admitting: Cardiovascular Disease

## 2023-04-26 ENCOUNTER — Ambulatory Visit
Admit: 2023-04-26 | Discharge: 2023-05-02 | Disposition: A | Discharge status: Home Health | Payer: Medicaid (Managed Care) | Admitting: Cardiovascular Disease

## 2023-04-26 ENCOUNTER — Ambulatory Visit: Admit: 2023-04-26 | Discharge: 2023-05-02 | Payer: Medicaid (Managed Care)

## 2023-04-26 ENCOUNTER — Inpatient Hospital Stay
Admit: 2023-04-26 | Discharge: 2023-05-02 | Disposition: A | Discharge status: Home Health | Payer: Medicaid (Managed Care) | Admitting: Cardiovascular Disease

## 2023-04-26 ENCOUNTER — Encounter
Admit: 2023-04-26 | Discharge: 2023-05-02 | Disposition: A | Discharge status: Home Health | Payer: Medicaid (Managed Care) | Admitting: Cardiovascular Disease

## 2023-04-26 ENCOUNTER — Encounter: Admit: 2023-04-26 | Discharge: 2023-05-02 | Payer: Medicaid (Managed Care)

## 2023-04-27 MED ORDER — PREDNISONE 50 MG TABLET
ORAL_TABLET | 0 refills | 0.00 days | Status: CP
Start: 2023-04-27 — End: 2023-04-27

## 2023-04-27 MED ORDER — DIPHENHYDRAMINE 25 MG TABLET
ORAL_TABLET | 0 refills | 0.00 days | Status: CP
Start: 2023-04-27 — End: 2023-04-27

## 2023-04-28 MED ORDER — DAPAGLIFLOZIN PROPANEDIOL 10 MG TABLET
ORAL_TABLET | ORAL | 0 refills | 0.00 days | Status: CP
Start: 2023-04-28 — End: 2023-04-28

## 2023-04-28 MED ORDER — EMPAGLIFLOZIN 10 MG TABLET
ORAL_TABLET | ORAL | 0 refills | 0.00 days | Status: CP
Start: 2023-04-28 — End: 2023-04-28

## 2023-04-30 MED ORDER — NICOTINE (POLACRILEX) 4 MG GUM
BUCCAL | 0 refills | 5.00 days | PRN
Start: 2023-04-30 — End: 2023-05-30

## 2023-05-01 MED ORDER — RIVAROXABAN 2.5 MG TABLET
ORAL_TABLET | Freq: Two times a day (BID) | ORAL | 3 refills | 30.00 days | Status: CP
Start: 2023-05-01 — End: ?
  Filled 2023-05-02: qty 60, 30d supply, fill #0

## 2023-05-01 MED ORDER — OXYCODONE 5 MG TABLET
ORAL_TABLET | ORAL | 0 refills | 2.00 days | Status: CP | PRN
Start: 2023-05-01 — End: ?
  Filled 2023-05-02: qty 12, 2d supply, fill #0

## 2023-05-02 ENCOUNTER — Inpatient Hospital Stay: Admit: 2023-05-02 | Payer: Medicaid (Managed Care)

## 2023-05-02 MED ORDER — AMOXICILLIN 875 MG-POTASSIUM CLAVULANATE 125 MG TABLET
ORAL_TABLET | Freq: Two times a day (BID) | ORAL | 0 refills | 10.00000 days | Status: CP
Start: 2023-05-02 — End: 2023-05-12
  Filled 2023-05-02: qty 20, 10d supply, fill #0

## 2023-05-02 MED ORDER — LOSARTAN 25 MG TABLET
ORAL_TABLET | Freq: Every day | ORAL | 3 refills | 90.00 days | Status: CP
Start: 2023-05-02 — End: ?
  Filled 2023-05-02: qty 90, 90d supply, fill #0

## 2023-05-02 MED ORDER — NICOTINE (POLACRILEX) 2 MG GUM
BUCCAL | 3 refills | 5.00000 days | Status: CP | PRN
Start: 2023-05-02 — End: ?
  Filled 2023-05-02: qty 110, 5d supply, fill #0

## 2023-05-05 ENCOUNTER — Ambulatory Visit
Admit: 2023-05-05 | Discharge: 2023-05-05 | Payer: Medicaid (Managed Care) | Attending: Internal Medicine | Primary: Internal Medicine

## 2023-05-05 ENCOUNTER — Ambulatory Visit: Admit: 2023-05-05 | Discharge: 2023-05-05 | Payer: Medicaid (Managed Care) | Attending: Clinical | Primary: Clinical

## 2023-05-05 DIAGNOSIS — E8889 Other specified metabolic disorders: Principal | ICD-10-CM

## 2023-05-05 DIAGNOSIS — E7800 Pure hypercholesterolemia, unspecified: Principal | ICD-10-CM

## 2023-05-05 DIAGNOSIS — E1165 Type 2 diabetes mellitus with hyperglycemia: Principal | ICD-10-CM

## 2023-05-05 DIAGNOSIS — F17200 Nicotine dependence, unspecified, uncomplicated: Principal | ICD-10-CM

## 2023-05-05 DIAGNOSIS — I10 Essential (primary) hypertension: Principal | ICD-10-CM

## 2023-05-05 DIAGNOSIS — M869 Osteomyelitis, unspecified: Principal | ICD-10-CM

## 2023-05-05 DIAGNOSIS — I739 Peripheral vascular disease, unspecified: Principal | ICD-10-CM

## 2023-05-05 DIAGNOSIS — G63 Polyneuropathy in diseases classified elsewhere: Principal | ICD-10-CM

## 2023-05-05 DIAGNOSIS — Z794 Long term (current) use of insulin: Principal | ICD-10-CM

## 2023-05-05 DIAGNOSIS — I2111 ST elevation (STEMI) myocardial infarction involving right coronary artery: Principal | ICD-10-CM

## 2023-05-05 MED ORDER — RIVAROXABAN 2.5 MG TABLET
ORAL_TABLET | Freq: Two times a day (BID) | ORAL | 3 refills | 90.00000 days | Status: CP
Start: 2023-05-05 — End: ?

## 2023-05-05 MED ORDER — LOSARTAN 25 MG TABLET
ORAL_TABLET | Freq: Every day | ORAL | 3 refills | 90.00000 days | Status: CP
Start: 2023-05-05 — End: ?

## 2023-05-07 ENCOUNTER — Ambulatory Visit: Admit: 2023-05-07 | Discharge: 2023-05-08 | Payer: Medicaid (Managed Care)

## 2023-05-07 DIAGNOSIS — R197 Diarrhea, unspecified: Principal | ICD-10-CM

## 2023-05-07 DIAGNOSIS — S98112A Complete traumatic amputation of left great toe, initial encounter: Principal | ICD-10-CM

## 2023-05-07 MED ORDER — OXYCODONE 5 MG TABLET
ORAL_TABLET | ORAL | 0 refills | 2.00000 days | Status: CP | PRN
Start: 2023-05-07 — End: ?

## 2023-05-08 MED ORDER — CYCLOBENZAPRINE 10 MG TABLET
ORAL_TABLET | Freq: Two times a day (BID) | ORAL | 0 refills | 0.00000 days | PRN
Start: 2023-05-08 — End: ?

## 2023-05-08 MED ORDER — PROMETHAZINE 25 MG TABLET
ORAL_TABLET | Freq: Two times a day (BID) | ORAL | 0 refills | 0.00000 days | PRN
Start: 2023-05-08 — End: ?

## 2023-05-09 MED ORDER — CYCLOBENZAPRINE 10 MG TABLET
ORAL_TABLET | Freq: Two times a day (BID) | ORAL | 1 refills | 90.00000 days | Status: CP | PRN
Start: 2023-05-09 — End: ?

## 2023-05-09 MED ORDER — PROMETHAZINE 25 MG TABLET
ORAL_TABLET | Freq: Two times a day (BID) | ORAL | 0 refills | 30.00000 days | Status: CP | PRN
Start: 2023-05-09 — End: ?

## 2023-05-12 ENCOUNTER — Encounter: Admit: 2023-05-12 | Discharge: 2023-05-13 | Payer: Medicaid (Managed Care) | Attending: Clinical | Primary: Clinical

## 2023-05-14 ENCOUNTER — Ambulatory Visit
Admit: 2023-05-14 | Discharge: 2023-05-14 | Payer: Medicaid (Managed Care) | Attending: Student in an Organized Health Care Education/Training Program | Primary: Student in an Organized Health Care Education/Training Program

## 2023-05-14 ENCOUNTER — Inpatient Hospital Stay: Admit: 2023-05-14 | Discharge: 2023-05-14 | Payer: Medicaid (Managed Care)

## 2023-05-14 DIAGNOSIS — I739 Peripheral vascular disease, unspecified: Principal | ICD-10-CM

## 2023-05-14 DIAGNOSIS — S98112A Complete traumatic amputation of left great toe, initial encounter: Principal | ICD-10-CM

## 2023-05-14 MED ORDER — OXYCODONE 5 MG TABLET
ORAL_TABLET | ORAL | 0 refills | 2.00000 days | PRN
Start: 2023-05-14 — End: ?

## 2023-05-15 MED ORDER — OXYCODONE 5 MG TABLET
ORAL_TABLET | ORAL | 0 refills | 2.00000 days | PRN
Start: 2023-05-15 — End: ?

## 2023-05-19 ENCOUNTER — Inpatient Hospital Stay: Admit: 2023-05-19 | Discharge: 2023-05-20 | Payer: Medicaid (Managed Care)

## 2023-05-19 ENCOUNTER — Ambulatory Visit
Admit: 2023-05-19 | Discharge: 2023-05-20 | Payer: Medicaid (Managed Care) | Attending: Foot & Ankle Surgery | Primary: Foot & Ankle Surgery

## 2023-05-19 DIAGNOSIS — Z89422 Acquired absence of other left toe(s): Principal | ICD-10-CM

## 2023-05-19 MED ORDER — TRAMADOL 50 MG TABLET
ORAL_TABLET | Freq: Three times a day (TID) | ORAL | 0 refills | 5.00000 days | Status: CP | PRN
Start: 2023-05-19 — End: 2023-05-24

## 2023-05-20 MED ORDER — NEXLETOL 180 MG TABLET
ORAL_TABLET | Freq: Every day | ORAL | 0 refills | 0.00000 days | Status: CP
Start: 2023-05-20 — End: ?

## 2023-05-26 ENCOUNTER — Ambulatory Visit: Admit: 2023-05-26 | Discharge: 2023-05-27 | Payer: Medicaid (Managed Care) | Attending: Acute Care | Primary: Acute Care

## 2023-05-26 DIAGNOSIS — G5623 Lesion of ulnar nerve, bilateral upper limbs: Principal | ICD-10-CM

## 2023-05-27 ENCOUNTER — Ambulatory Visit: Admit: 2023-05-27 | Discharge: 2023-05-28 | Payer: Medicaid (Managed Care)

## 2023-05-27 ENCOUNTER — Ambulatory Visit
Admit: 2023-05-27 | Discharge: 2023-05-28 | Payer: Medicaid (Managed Care) | Attending: Student in an Organized Health Care Education/Training Program | Primary: Student in an Organized Health Care Education/Training Program

## 2023-05-27 DIAGNOSIS — I2510 Atherosclerotic heart disease of native coronary artery without angina pectoris: Principal | ICD-10-CM

## 2023-05-27 DIAGNOSIS — I739 Peripheral vascular disease, unspecified: Principal | ICD-10-CM

## 2023-05-27 DIAGNOSIS — Z794 Long term (current) use of insulin: Principal | ICD-10-CM

## 2023-05-27 DIAGNOSIS — E1165 Type 2 diabetes mellitus with hyperglycemia: Principal | ICD-10-CM

## 2023-05-27 DIAGNOSIS — I10 Essential (primary) hypertension: Principal | ICD-10-CM

## 2023-05-27 DIAGNOSIS — G4700 Insomnia, unspecified: Principal | ICD-10-CM

## 2023-05-27 MED ORDER — BELSOMRA 10 MG TABLET
ORAL_TABLET | Freq: Every evening | ORAL | 5 refills | 30.00000 days | Status: CP
Start: 2023-05-27 — End: ?

## 2023-05-29 ENCOUNTER — Ambulatory Visit: Admit: 2023-05-29 | Discharge: 2023-05-30 | Payer: Medicaid (Managed Care) | Attending: Podiatrist | Primary: Podiatrist

## 2023-05-29 DIAGNOSIS — L97521 Non-pressure chronic ulcer of other part of left foot limited to breakdown of skin: Principal | ICD-10-CM

## 2023-05-29 DIAGNOSIS — I739 Peripheral vascular disease, unspecified: Principal | ICD-10-CM

## 2023-05-29 DIAGNOSIS — Z89422 Acquired absence of other left toe(s): Principal | ICD-10-CM

## 2023-05-29 DIAGNOSIS — Z872 Personal history of diseases of the skin and subcutaneous tissue: Principal | ICD-10-CM

## 2023-05-29 DIAGNOSIS — L84 Corns and callosities: Principal | ICD-10-CM

## 2023-05-29 DIAGNOSIS — Z8639 Personal history of other endocrine, nutritional and metabolic disease: Principal | ICD-10-CM

## 2023-05-30 ENCOUNTER — Encounter: Admit: 2023-05-30 | Discharge: 2023-05-31 | Payer: Medicaid (Managed Care) | Attending: Clinical | Primary: Clinical

## 2023-06-03 ENCOUNTER — Ambulatory Visit
Admit: 2023-06-03 | Discharge: 2023-06-06 | Disposition: A | Discharge status: Home / Self Care | Payer: Medicaid (Managed Care)

## 2023-06-03 ENCOUNTER — Inpatient Hospital Stay
Admit: 2023-06-03 | Discharge: 2023-06-06 | Disposition: A | Discharge status: Home / Self Care | Payer: Medicaid (Managed Care)

## 2023-06-06 MED ORDER — METOCLOPRAMIDE 5 MG TABLET
ORAL_TABLET | Freq: Four times a day (QID) | ORAL | 0 refills | 30.00000 days | Status: CP
Start: 2023-06-06 — End: 2023-07-06
  Filled 2023-06-06: qty 80, 10d supply, fill #0

## 2023-06-06 MED ORDER — SMOG ENEMA
Freq: Once | RECTAL | 0 refills | 1.00000 days | Status: CP
Start: 2023-06-06 — End: 2023-06-07

## 2023-06-06 MED ORDER — POLYETHYLENE GLYCOL 3350 17 GRAM ORAL POWDER PACKET
PACK | Freq: Three times a day (TID) | ORAL | 0 refills | 30.00000 days | Status: CP | PRN
Start: 2023-06-06 — End: ?
  Filled 2023-06-06: qty 90, 30d supply, fill #0

## 2023-06-06 MED ORDER — SENNOSIDES 8.6 MG TABLET
ORAL_TABLET | Freq: Every evening | ORAL | 0 refills | 30.00000 days | Status: CP
Start: 2023-06-06 — End: 2023-07-06
  Filled 2023-06-06: qty 60, 30d supply, fill #0

## 2023-06-06 MED FILL — MINERAL OIL ORAL, SORBITOL 70 % SOLUTION, MILK OF MAGNESIA 400 MG/5 ML ORAL SUSPENSION, GLYCERIN 99.5 % TOPICAL SOLUTION: RECTAL | 1 days supply | Qty: 240 | Fill #0

## 2023-06-09 ENCOUNTER — Encounter: Admit: 2023-06-09 | Discharge: 2023-06-10 | Payer: Medicaid (Managed Care) | Attending: Clinical | Primary: Clinical

## 2023-06-10 ENCOUNTER — Ambulatory Visit
Admit: 2023-06-10 | Discharge: 2023-06-11 | Payer: Medicaid (Managed Care) | Attending: Student in an Organized Health Care Education/Training Program | Primary: Student in an Organized Health Care Education/Training Program

## 2023-06-10 ENCOUNTER — Ambulatory Visit: Admit: 2023-06-10 | Discharge: 2023-06-11 | Payer: Medicaid (Managed Care)

## 2023-06-10 DIAGNOSIS — E083553 Diabetes mellitus due to underlying condition with stable proliferative diabetic retinopathy, bilateral: Principal | ICD-10-CM

## 2023-06-11 ENCOUNTER — Emergency Department
Admit: 2023-06-11 | Discharge: 2023-06-11 | Disposition: A | Discharge status: Home / Self Care | Payer: Medicaid (Managed Care)

## 2023-06-12 ENCOUNTER — Ambulatory Visit: Admit: 2023-06-12 | Discharge: 2023-06-13 | Payer: Medicaid (Managed Care)

## 2023-06-12 DIAGNOSIS — R112 Nausea with vomiting, unspecified: Principal | ICD-10-CM

## 2023-06-12 DIAGNOSIS — E1165 Type 2 diabetes mellitus with hyperglycemia: Principal | ICD-10-CM

## 2023-06-13 MED ORDER — CYCLOBENZAPRINE 10 MG TABLET
ORAL_TABLET | Freq: Three times a day (TID) | ORAL | 3 refills | 30.00000 days | Status: CP | PRN
Start: 2023-06-13 — End: ?

## 2023-06-17 ENCOUNTER — Ambulatory Visit: Admit: 2023-06-17 | Payer: Medicaid (Managed Care)

## 2023-06-18 ENCOUNTER — Ambulatory Visit
Admit: 2023-06-18 | Discharge: 2023-06-18 | Payer: Medicaid (Managed Care) | Attending: Vascular Surgery | Primary: Vascular Surgery

## 2023-06-18 ENCOUNTER — Ambulatory Visit
Admit: 2023-06-18 | Discharge: 2023-06-18 | Payer: Medicaid (Managed Care) | Attending: Student in an Organized Health Care Education/Training Program | Primary: Student in an Organized Health Care Education/Training Program

## 2023-06-18 DIAGNOSIS — L97512 Non-pressure chronic ulcer of other part of right foot with fat layer exposed: Principal | ICD-10-CM

## 2023-06-18 DIAGNOSIS — I739 Peripheral vascular disease, unspecified: Principal | ICD-10-CM

## 2023-06-21 MED ORDER — PROMETHAZINE 25 MG TABLET
ORAL_TABLET | Freq: Two times a day (BID) | ORAL | 0 refills | 0.00000 days | PRN
Start: 2023-06-21 — End: ?

## 2023-06-24 DIAGNOSIS — E083553 Diabetes mellitus due to underlying condition with stable proliferative diabetic retinopathy, bilateral: Principal | ICD-10-CM

## 2023-06-24 MED ORDER — PROMETHAZINE 25 MG TABLET
ORAL_TABLET | Freq: Two times a day (BID) | ORAL | 1 refills | 30.00000 days | Status: CP | PRN
Start: 2023-06-24 — End: ?

## 2023-06-25 ENCOUNTER — Ambulatory Visit: Admit: 2023-06-25 | Discharge: 2023-06-26 | Payer: Medicaid (Managed Care)

## 2023-06-26 ENCOUNTER — Ambulatory Visit: Admit: 2023-06-26 | Discharge: 2023-06-27 | Payer: Medicaid (Managed Care)

## 2023-06-26 DIAGNOSIS — F331 Major depressive disorder, recurrent, moderate: Principal | ICD-10-CM

## 2023-06-26 DIAGNOSIS — Z794 Long term (current) use of insulin: Principal | ICD-10-CM

## 2023-06-26 DIAGNOSIS — R112 Nausea with vomiting, unspecified: Principal | ICD-10-CM

## 2023-06-26 DIAGNOSIS — G5623 Lesion of ulnar nerve, bilateral upper limbs: Principal | ICD-10-CM

## 2023-06-26 DIAGNOSIS — E1165 Type 2 diabetes mellitus with hyperglycemia: Principal | ICD-10-CM

## 2023-06-26 DIAGNOSIS — F17200 Nicotine dependence, unspecified, uncomplicated: Principal | ICD-10-CM

## 2023-07-05 DIAGNOSIS — G8929 Other chronic pain: Principal | ICD-10-CM

## 2023-07-05 DIAGNOSIS — F330 Major depressive disorder, recurrent, mild: Principal | ICD-10-CM

## 2023-07-05 MED ORDER — DULOXETINE 60 MG CAPSULE,DELAYED RELEASE
ORAL_CAPSULE | Freq: Every day | ORAL | 0 refills | 0.00000 days
Start: 2023-07-05 — End: ?

## 2023-07-07 MED ORDER — DULOXETINE 60 MG CAPSULE,DELAYED RELEASE
ORAL_CAPSULE | Freq: Every day | ORAL | 3 refills | 90.00000 days | Status: CP
Start: 2023-07-07 — End: ?

## 2023-07-17 ENCOUNTER — Ambulatory Visit: Admit: 2023-07-17 | Discharge: 2023-07-18 | Payer: Medicaid (Managed Care)

## 2023-07-17 DIAGNOSIS — Z794 Long term (current) use of insulin: Principal | ICD-10-CM

## 2023-07-17 DIAGNOSIS — E1165 Type 2 diabetes mellitus with hyperglycemia: Principal | ICD-10-CM

## 2023-07-22 MED ORDER — OMEPRAZOLE 20 MG CAPSULE,DELAYED RELEASE
ORAL_CAPSULE | Freq: Every day | ORAL | 3 refills | 90.00000 days | Status: CP
Start: 2023-07-22 — End: 2024-07-16

## 2023-07-24 DIAGNOSIS — F331 Major depressive disorder, recurrent, moderate: Principal | ICD-10-CM

## 2023-07-24 MED ORDER — DULOXETINE 30 MG CAPSULE,DELAYED RELEASE
ORAL_CAPSULE | ORAL | 3 refills | 0.00000 days | Status: CP
Start: 2023-07-24 — End: ?

## 2023-08-04 ENCOUNTER — Ambulatory Visit: Admit: 2023-08-04 | Discharge: 2023-08-15 | Payer: Medicaid (Managed Care)

## 2023-08-04 ENCOUNTER — Ambulatory Visit: Admit: 2023-08-04 | Payer: Medicaid (Managed Care)

## 2023-08-06 ENCOUNTER — Ambulatory Visit
Admit: 2023-08-06 | Discharge: 2023-08-07 | Payer: Medicaid (Managed Care) | Attending: Vascular Surgery | Primary: Vascular Surgery

## 2023-08-06 DIAGNOSIS — Z8639 Personal history of other endocrine, nutritional and metabolic disease: Principal | ICD-10-CM

## 2023-08-06 DIAGNOSIS — I739 Peripheral vascular disease, unspecified: Principal | ICD-10-CM

## 2023-08-15 ENCOUNTER — Encounter: Admit: 2023-08-15 | Discharge: 2023-08-16 | Payer: Medicaid (Managed Care)

## 2023-08-15 ENCOUNTER — Encounter
Admit: 2023-08-15 | Discharge: 2023-08-16 | Payer: Medicaid (Managed Care) | Attending: Student in an Organized Health Care Education/Training Program | Primary: Student in an Organized Health Care Education/Training Program

## 2023-08-15 ENCOUNTER — Inpatient Hospital Stay: Admit: 2023-08-15 | Discharge: 2023-08-16 | Payer: Medicaid (Managed Care)

## 2023-08-15 ENCOUNTER — Ambulatory Visit: Admit: 2023-08-15 | Discharge: 2023-08-16 | Payer: Medicaid (Managed Care)

## 2023-08-15 ENCOUNTER — Encounter
Admit: 2023-08-15 | Discharge: 2023-08-16 | Payer: Medicaid (Managed Care) | Attending: Emergency Medicine | Primary: Emergency Medicine

## 2023-08-16 MED ORDER — AMLODIPINE 10 MG TABLET
ORAL_TABLET | Freq: Every day | ORAL | 3 refills | 90.00000 days | Status: CP
Start: 2023-08-16 — End: 2024-08-15

## 2023-08-16 MED ORDER — FUROSEMIDE 20 MG TABLET
ORAL_TABLET | Freq: Every day | ORAL | 0 refills | 90.00000 days | Status: CP
Start: 2023-08-16 — End: 2023-11-14

## 2023-08-16 MED ORDER — SPIRONOLACTONE 25 MG TABLET
ORAL_TABLET | Freq: Every day | ORAL | 2 refills | 30.00000 days | Status: CP
Start: 2023-08-16 — End: 2023-11-14

## 2023-08-21 DIAGNOSIS — E1165 Type 2 diabetes mellitus with hyperglycemia: Principal | ICD-10-CM

## 2023-08-21 DIAGNOSIS — H43393 Other vitreous opacities, bilateral: Principal | ICD-10-CM

## 2023-08-21 DIAGNOSIS — E083553 Diabetes mellitus due to underlying condition with stable proliferative diabetic retinopathy, bilateral: Principal | ICD-10-CM

## 2023-08-21 DIAGNOSIS — E113299 Type 2 diabetes mellitus with mild nonproliferative diabetic retinopathy without macular edema, unspecified eye: Principal | ICD-10-CM

## 2023-08-21 DIAGNOSIS — Z794 Long term (current) use of insulin: Principal | ICD-10-CM

## 2023-08-22 ENCOUNTER — Ambulatory Visit
Admit: 2023-08-22 | Discharge: 2023-08-23 | Payer: Medicaid (Managed Care) | Attending: Adult Health | Primary: Adult Health

## 2023-08-22 ENCOUNTER — Encounter
Admit: 2023-08-22 | Discharge: 2023-08-23 | Payer: Medicaid (Managed Care) | Attending: Adult Health | Primary: Adult Health

## 2023-08-22 MED ORDER — FUROSEMIDE 40 MG TABLET
ORAL_TABLET | Freq: Every day | ORAL | 0 refills | 30.00000 days | Status: CP
Start: 2023-08-22 — End: 2023-09-21

## 2023-08-25 ENCOUNTER — Ambulatory Visit: Admit: 2023-08-25 | Payer: Medicaid (Managed Care)

## 2023-08-26 ENCOUNTER — Inpatient Hospital Stay: Admit: 2023-08-26 | Discharge: 2023-08-27 | Payer: Medicaid (Managed Care)

## 2023-08-26 DIAGNOSIS — I5033 Acute on chronic diastolic (congestive) heart failure: Principal | ICD-10-CM

## 2023-09-04 ENCOUNTER — Ambulatory Visit: Admit: 2023-09-04 | Discharge: 2023-09-05 | Payer: Medicaid (Managed Care)

## 2023-09-04 MED ORDER — POTASSIUM CHLORIDE ER 20 MEQ TABLET,EXTENDED RELEASE(PART/CRYST)
ORAL_TABLET | Freq: Two times a day (BID) | ORAL | 0 refills | 30.00000 days | Status: CP
Start: 2023-09-04 — End: 2023-10-04

## 2023-09-08 MED ORDER — PROMETHAZINE 25 MG TABLET
ORAL_TABLET | Freq: Two times a day (BID) | ORAL | 0 refills | 0.00000 days | PRN
Start: 2023-09-08 — End: ?

## 2023-09-09 MED ORDER — PROMETHAZINE 25 MG TABLET
ORAL_TABLET | Freq: Two times a day (BID) | ORAL | 3 refills | 30.00000 days | Status: CP | PRN
Start: 2023-09-09 — End: ?

## 2023-09-11 ENCOUNTER — Encounter: Admit: 2023-09-11 | Discharge: 2023-09-11 | Payer: Medicaid (Managed Care)

## 2023-09-11 DIAGNOSIS — Z01419 Encounter for gynecological examination (general) (routine) without abnormal findings: Principal | ICD-10-CM

## 2023-09-11 DIAGNOSIS — R87810 Cervical high risk human papillomavirus (HPV) DNA test positive: Principal | ICD-10-CM

## 2023-09-17 ENCOUNTER — Inpatient Hospital Stay: Admit: 2023-09-17 | Discharge: 2023-09-17 | Payer: Medicaid (Managed Care)

## 2023-09-17 ENCOUNTER — Ambulatory Visit
Admit: 2023-09-17 | Discharge: 2023-09-17 | Payer: Medicaid (Managed Care) | Attending: Student in an Organized Health Care Education/Training Program | Primary: Student in an Organized Health Care Education/Training Program

## 2023-09-17 DIAGNOSIS — I739 Peripheral vascular disease, unspecified: Principal | ICD-10-CM

## 2023-09-18 MED ORDER — FUROSEMIDE 40 MG TABLET
ORAL_TABLET | Freq: Every day | ORAL | 3 refills | 30.00000 days | Status: CP
Start: 2023-09-18 — End: ?

## 2023-09-23 DIAGNOSIS — I2510 Atherosclerotic heart disease of native coronary artery without angina pectoris: Principal | ICD-10-CM

## 2023-09-23 DIAGNOSIS — I5032 Chronic diastolic (congestive) heart failure: Principal | ICD-10-CM

## 2023-09-23 DIAGNOSIS — I10 Essential (primary) hypertension: Principal | ICD-10-CM

## 2023-09-23 MED ORDER — HYDROXYZINE HCL 25 MG TABLET
ORAL_TABLET | Freq: Every evening | ORAL | 3 refills | 90.00000 days | Status: CP | PRN
Start: 2023-09-23 — End: ?

## 2023-09-25 ENCOUNTER — Ambulatory Visit: Admit: 2023-09-25 | Discharge: 2023-09-26 | Payer: Medicaid (Managed Care)

## 2023-09-26 ENCOUNTER — Encounter: Admit: 2023-09-26 | Discharge: 2023-09-26 | Payer: Medicaid (Managed Care)

## 2023-09-26 DIAGNOSIS — I5032 Chronic diastolic (congestive) heart failure: Principal | ICD-10-CM

## 2023-09-26 DIAGNOSIS — E7800 Pure hypercholesterolemia, unspecified: Principal | ICD-10-CM

## 2023-09-26 DIAGNOSIS — E1165 Type 2 diabetes mellitus with hyperglycemia: Principal | ICD-10-CM

## 2023-09-26 DIAGNOSIS — F17200 Nicotine dependence, unspecified, uncomplicated: Principal | ICD-10-CM

## 2023-09-26 DIAGNOSIS — F331 Major depressive disorder, recurrent, moderate: Principal | ICD-10-CM

## 2023-09-26 DIAGNOSIS — Z794 Long term (current) use of insulin: Principal | ICD-10-CM

## 2023-09-26 DIAGNOSIS — I739 Peripheral vascular disease, unspecified: Principal | ICD-10-CM

## 2023-09-26 DIAGNOSIS — I10 Essential (primary) hypertension: Principal | ICD-10-CM

## 2023-09-26 DIAGNOSIS — I2510 Atherosclerotic heart disease of native coronary artery without angina pectoris: Principal | ICD-10-CM

## 2023-09-26 MED ORDER — POTASSIUM CHLORIDE ER 20 MEQ TABLET,EXTENDED RELEASE(PART/CRYST)
ORAL_TABLET | Freq: Every day | ORAL | 1 refills | 90.00000 days | Status: CP
Start: 2023-09-26 — End: ?

## 2023-09-28 ENCOUNTER — Inpatient Hospital Stay
Admit: 2023-09-28 | Discharge: 2023-10-01 | Disposition: A | Discharge status: Home / Self Care | Payer: Medicaid (Managed Care)

## 2023-09-28 ENCOUNTER — Ambulatory Visit
Admission: EM | Admit: 2023-09-28 | Discharge: 2023-10-01 | Disposition: A | Discharge status: Home / Self Care | Payer: Medicaid (Managed Care)

## 2023-10-01 MED ORDER — METOCLOPRAMIDE 10 MG TABLET
ORAL_TABLET | Freq: Three times a day (TID) | ORAL | 2 refills | 30.00000 days | Status: CP
Start: 2023-10-01 — End: 2023-12-30

## 2023-10-01 MED ORDER — LIPASE-PROTEASE-AMYLASE 3,000-9,500-15,000 UNIT CAPSULE, DELAYED REL
ORAL_CAPSULE | Freq: Three times a day (TID) | ORAL | 2 refills | 30.00000 days | Status: CP
Start: 2023-10-01 — End: 2023-12-30

## 2023-10-03 MED ORDER — CYCLOBENZAPRINE 10 MG TABLET
ORAL_TABLET | Freq: Three times a day (TID) | ORAL | 3 refills | 30.00000 days | Status: CP | PRN
Start: 2023-10-03 — End: ?

## 2023-10-08 ENCOUNTER — Ambulatory Visit: Admit: 2023-10-08 | Payer: Medicaid (Managed Care)

## 2023-10-09 ENCOUNTER — Encounter: Admit: 2023-10-09 | Discharge: 2023-10-09 | Payer: Medicaid (Managed Care)

## 2023-10-09 DIAGNOSIS — Z794 Long term (current) use of insulin: Principal | ICD-10-CM

## 2023-10-09 DIAGNOSIS — E1165 Type 2 diabetes mellitus with hyperglycemia: Principal | ICD-10-CM

## 2023-10-09 DIAGNOSIS — I5032 Chronic diastolic (congestive) heart failure: Principal | ICD-10-CM

## 2023-10-09 DIAGNOSIS — I10 Essential (primary) hypertension: Principal | ICD-10-CM

## 2023-10-09 DIAGNOSIS — R112 Nausea with vomiting, unspecified: Principal | ICD-10-CM

## 2023-10-09 DIAGNOSIS — R195 Other fecal abnormalities: Principal | ICD-10-CM

## 2023-10-13 ENCOUNTER — Encounter: Admit: 2023-10-13 | Discharge: 2023-10-13 | Payer: Medicaid (Managed Care)

## 2023-10-13 DIAGNOSIS — Z794 Long term (current) use of insulin: Principal | ICD-10-CM

## 2023-10-13 DIAGNOSIS — E1165 Type 2 diabetes mellitus with hyperglycemia: Principal | ICD-10-CM

## 2023-10-13 DIAGNOSIS — I10 Essential (primary) hypertension: Principal | ICD-10-CM

## 2023-10-13 DIAGNOSIS — R197 Diarrhea, unspecified: Principal | ICD-10-CM

## 2023-10-13 DIAGNOSIS — R112 Nausea with vomiting, unspecified: Principal | ICD-10-CM

## 2023-10-13 DIAGNOSIS — I5032 Chronic diastolic (congestive) heart failure: Principal | ICD-10-CM

## 2023-10-13 MED ORDER — HYDROCHLOROTHIAZIDE 25 MG TABLET
ORAL_TABLET | Freq: Every day | ORAL | 3 refills | 90.00000 days | Status: CP
Start: 2023-10-13 — End: 2024-10-07

## 2023-10-14 ENCOUNTER — Ambulatory Visit
Admission: EM | Admit: 2023-10-14 | Discharge: 2023-10-18 | Disposition: A | Discharge status: Home / Self Care | Payer: Medicaid (Managed Care) | Admitting: Student in an Organized Health Care Education/Training Program

## 2023-10-14 ENCOUNTER — Ambulatory Visit: Admit: 2023-10-14 | Payer: Medicaid (Managed Care)

## 2023-10-14 ENCOUNTER — Inpatient Hospital Stay
Admission: EM | Admit: 2023-10-14 | Discharge: 2023-10-18 | Disposition: A | Discharge status: Home / Self Care | Payer: Medicaid (Managed Care) | Admitting: Student in an Organized Health Care Education/Training Program

## 2023-10-14 ENCOUNTER — Ambulatory Visit: Admit: 2023-10-14 | Discharge: 2023-10-14 | Payer: Medicaid (Managed Care)

## 2023-10-14 MED ORDER — NEXLETOL 180 MG TABLET
ORAL_TABLET | Freq: Every day | ORAL | 3 refills | 0.00000 days | Status: CP
Start: 2023-10-14 — End: ?

## 2023-10-18 MED ORDER — POTASSIUM CHLORIDE ER 20 MEQ TABLET,EXTENDED RELEASE(PART/CRYST)
ORAL_TABLET | Freq: Two times a day (BID) | ORAL | 1 refills | 45.00000 days
Start: 2023-10-18 — End: ?

## 2023-10-18 MED ORDER — TORSEMIDE 40 MG TABLET
ORAL_TABLET | Freq: Every day | ORAL | 0 refills | 60.00000 days | Status: CP
Start: 2023-10-18 — End: ?

## 2023-10-20 ENCOUNTER — Ambulatory Visit: Admit: 2023-10-20 | Payer: Medicaid (Managed Care)

## 2023-10-20 ENCOUNTER — Ambulatory Visit: Admit: 2023-10-20 | Discharge: 2023-11-13 | Payer: Medicaid (Managed Care)

## 2023-10-22 ENCOUNTER — Ambulatory Visit: Admit: 2023-10-22 | Discharge: 2023-10-23 | Payer: Medicaid (Managed Care)

## 2023-10-22 DIAGNOSIS — Z794 Long term (current) use of insulin: Principal | ICD-10-CM

## 2023-10-22 DIAGNOSIS — E1165 Type 2 diabetes mellitus with hyperglycemia: Principal | ICD-10-CM

## 2023-10-22 MED ORDER — GLUCAGON 3 MG/ACTUATION NASAL SPRAY
NASAL | 2 refills | 0.00000 days | Status: CP
Start: 2023-10-22 — End: ?

## 2023-10-23 ENCOUNTER — Inpatient Hospital Stay
Admission: EM | Admit: 2023-10-23 | Discharge: 2023-10-29 | Disposition: A | Discharge status: Home / Self Care | Payer: Medicaid (Managed Care)

## 2023-10-23 ENCOUNTER — Ambulatory Visit: Admit: 2023-10-23 | Discharge: 2023-10-29 | Payer: Medicaid (Managed Care)

## 2023-10-29 MED ORDER — METOLAZONE 2.5 MG TABLET
ORAL_TABLET | Freq: Every day | ORAL | 0 refills | 90.00000 days | Status: CP | PRN
Start: 2023-10-29 — End: 2024-01-27

## 2023-10-31 ENCOUNTER — Inpatient Hospital Stay: Admit: 2023-10-31 | Discharge: 2023-10-31 | Payer: Medicaid (Managed Care)

## 2023-10-31 DIAGNOSIS — R0602 Shortness of breath: Principal | ICD-10-CM

## 2023-11-04 ENCOUNTER — Inpatient Hospital Stay
Admission: EM | Admit: 2023-11-04 | Discharge: 2023-11-05 | Disposition: A | Discharge status: Home / Self Care | Payer: Medicaid (Managed Care) | Admitting: Internal Medicine

## 2023-11-05 MED ORDER — TORSEMIDE 40 MG TABLET
ORAL_TABLET | Freq: Every day | ORAL | 1 refills | 90.00000 days | Status: CP
Start: 2023-11-05 — End: 2024-05-03

## 2023-11-06 MED ORDER — LOSARTAN 25 MG TABLET
ORAL_TABLET | Freq: Every day | ORAL | 0 refills | 90.00000 days | Status: CP
Start: 2023-11-06 — End: 2024-02-04

## 2023-11-07 MED ORDER — TORSEMIDE 20 MG TABLET
ORAL_TABLET | Freq: Every day | ORAL | 1 refills | 100.00000 days | Status: CP
Start: 2023-11-07 — End: ?

## 2023-11-11 DIAGNOSIS — E083553 Diabetes mellitus due to underlying condition with stable proliferative diabetic retinopathy, bilateral: Principal | ICD-10-CM

## 2023-11-13 ENCOUNTER — Encounter
Admit: 2023-11-13 | Discharge: 2023-11-13 | Payer: Medicaid (Managed Care) | Attending: Adult Health | Primary: Adult Health

## 2023-11-13 DIAGNOSIS — E861 Hypovolemia: Principal | ICD-10-CM

## 2023-11-13 DIAGNOSIS — E611 Iron deficiency: Principal | ICD-10-CM

## 2023-11-13 DIAGNOSIS — I10 Essential (primary) hypertension: Principal | ICD-10-CM

## 2023-11-13 DIAGNOSIS — I5032 Chronic diastolic (congestive) heart failure: Principal | ICD-10-CM

## 2023-11-13 DIAGNOSIS — I2510 Atherosclerotic heart disease of native coronary artery without angina pectoris: Principal | ICD-10-CM

## 2023-11-14 ENCOUNTER — Inpatient Hospital Stay: Admit: 2023-11-14 | Discharge: 2023-11-15 | Payer: Medicaid (Managed Care)

## 2023-11-14 ENCOUNTER — Ambulatory Visit: Admit: 2023-11-14 | Discharge: 2023-11-15 | Payer: Medicaid (Managed Care)

## 2023-11-14 ENCOUNTER — Encounter: Admit: 2023-11-14 | Discharge: 2023-11-15 | Payer: Medicaid (Managed Care)

## 2023-11-15 MED ORDER — METOLAZONE 2.5 MG TABLET
ORAL_TABLET | Freq: Every day | ORAL | 0 refills | 90.00000 days | Status: CP | PRN
Start: 2023-11-15 — End: 2024-02-13

## 2023-11-15 MED ORDER — TORSEMIDE 20 MG TABLET
ORAL_TABLET | Freq: Every day | ORAL | 0 refills | 90.00000 days | Status: CP
Start: 2023-11-15 — End: 2024-02-13

## 2023-11-15 MED ORDER — SPIRONOLACTONE 25 MG TABLET
ORAL_TABLET | Freq: Every day | ORAL | 2 refills | 30.00000 days
Start: 2023-11-15 — End: 2024-02-13

## 2023-11-16 DIAGNOSIS — M792 Neuralgia and neuritis, unspecified: Principal | ICD-10-CM

## 2023-11-16 MED ORDER — GABAPENTIN 300 MG CAPSULE
ORAL_CAPSULE | ORAL | 0 refills | 0.00000 days | Status: CP
Start: 2023-11-16 — End: ?

## 2023-11-18 DIAGNOSIS — H35052 Retinal neovascularization, unspecified, left eye: Principal | ICD-10-CM

## 2023-11-18 DIAGNOSIS — E133592 Other specified diabetes mellitus with proliferative diabetic retinopathy without macular edema, left eye: Principal | ICD-10-CM

## 2023-11-18 MED ORDER — BRIMONIDINE 0.1 % EYE DROPS
Freq: Three times a day (TID) | OPHTHALMIC | 2 refills | 30.00000 days | Status: CP
Start: 2023-11-18 — End: ?

## 2023-11-19 ENCOUNTER — Ambulatory Visit: Admit: 2023-11-19 | Payer: Medicaid (Managed Care)

## 2023-11-24 DIAGNOSIS — H40003 Preglaucoma, unspecified, bilateral: Principal | ICD-10-CM

## 2023-11-25 ENCOUNTER — Ambulatory Visit: Admit: 2023-11-25 | Discharge: 2023-11-26 | Payer: Medicaid (Managed Care)

## 2023-11-25 DIAGNOSIS — E1165 Type 2 diabetes mellitus with hyperglycemia: Principal | ICD-10-CM

## 2023-11-25 DIAGNOSIS — Z794 Long term (current) use of insulin: Principal | ICD-10-CM

## 2023-11-30 ENCOUNTER — Inpatient Hospital Stay
Admission: EM | Admit: 2023-11-30 | Discharge: 2023-12-02 | Disposition: A | Discharge status: Home / Self Care | Payer: Medicaid (Managed Care) | Admitting: Student in an Organized Health Care Education/Training Program

## 2023-11-30 ENCOUNTER — Ambulatory Visit
Admission: EM | Admit: 2023-11-30 | Discharge: 2023-12-02 | Disposition: A | Discharge status: Home / Self Care | Payer: Medicaid (Managed Care) | Admitting: Student in an Organized Health Care Education/Training Program

## 2023-12-02 MED ORDER — ATORVASTATIN 80 MG TABLET
ORAL_TABLET | Freq: Every day | ORAL | 3 refills | 90.00000 days | Status: CN
Start: 2023-12-02 — End: 2024-11-26

## 2023-12-03 MED ORDER — SPIRONOLACTONE 25 MG TABLET
ORAL_TABLET | Freq: Every day | ORAL | 3 refills | 90.00000 days | Status: CP
Start: 2023-12-03 — End: 2024-11-27

## 2023-12-03 MED ORDER — ASPIRIN 81 MG CHEWABLE TABLET
ORAL_TABLET | Freq: Every day | ORAL | 2 refills | 30.00000 days | Status: CP
Start: 2023-12-03 — End: 2024-03-02

## 2023-12-03 MED ORDER — METOPROLOL SUCCINATE ER 50 MG TABLET,EXTENDED RELEASE 24 HR
ORAL_TABLET | Freq: Every day | ORAL | 3 refills | 90.00000 days | Status: CP
Start: 2023-12-03 — End: 2024-11-27

## 2023-12-06 DIAGNOSIS — G4700 Insomnia, unspecified: Principal | ICD-10-CM

## 2023-12-06 MED ORDER — ATORVASTATIN 80 MG TABLET
ORAL_TABLET | Freq: Every day | ORAL | 0 refills | 0.00000 days
Start: 2023-12-06 — End: ?

## 2023-12-06 MED ORDER — BELSOMRA 10 MG TABLET
ORAL_TABLET | Freq: Every evening | ORAL | 0 refills | 0.00000 days
Start: 2023-12-06 — End: ?

## 2023-12-08 MED ORDER — ATORVASTATIN 80 MG TABLET
ORAL_TABLET | Freq: Every day | ORAL | 3 refills | 90.00000 days | Status: CP
Start: 2023-12-08 — End: ?

## 2023-12-08 MED ORDER — BELSOMRA 10 MG TABLET
ORAL_TABLET | Freq: Every evening | ORAL | 5 refills | 30.00000 days | Status: CP
Start: 2023-12-08 — End: ?

## 2023-12-09 ENCOUNTER — Ambulatory Visit: Admit: 2023-12-09 | Discharge: 2023-12-10 | Payer: Medicaid (Managed Care) | Attending: Acute Care | Primary: Acute Care

## 2023-12-09 DIAGNOSIS — M792 Neuralgia and neuritis, unspecified: Principal | ICD-10-CM

## 2023-12-09 MED ORDER — DOXYCYCLINE HYCLATE 100 MG TABLET
ORAL_TABLET | Freq: Two times a day (BID) | ORAL | 0 refills | 10.00000 days | Status: CP
Start: 2023-12-09 — End: 2023-12-19

## 2023-12-09 MED ORDER — GABAPENTIN 300 MG CAPSULE
ORAL_CAPSULE | ORAL | 6 refills | 0.00000 days | Status: CP
Start: 2023-12-09 — End: ?

## 2023-12-13 MED ORDER — EZETIMIBE 10 MG TABLET
ORAL_TABLET | Freq: Every day | ORAL | 0 refills | 0.00000 days
Start: 2023-12-13 — End: ?

## 2023-12-16 MED ORDER — OMNIPOD 5 G6-G7 PODS (GEN 5) SUBCUTANEOUS CARTRIDGE
3 refills | 0.00000 days | Status: CP
Start: 2023-12-16 — End: ?

## 2023-12-16 MED ORDER — EZETIMIBE 10 MG TABLET
ORAL_TABLET | Freq: Every day | ORAL | 3 refills | 90.00000 days | Status: CP
Start: 2023-12-16 — End: ?

## 2023-12-17 ENCOUNTER — Ambulatory Visit
Admit: 2023-12-17 | Discharge: 2023-12-18 | Payer: Medicaid (Managed Care) | Attending: Vascular Surgery | Primary: Vascular Surgery

## 2023-12-17 DIAGNOSIS — E11621 Type 2 diabetes mellitus with foot ulcer: Principal | ICD-10-CM

## 2023-12-17 DIAGNOSIS — L97519 Non-pressure chronic ulcer of other part of right foot with unspecified severity: Principal | ICD-10-CM

## 2023-12-18 ENCOUNTER — Inpatient Hospital Stay: Admit: 2023-12-18 | Discharge: 2023-12-18 | Payer: Medicaid (Managed Care)

## 2023-12-23 ENCOUNTER — Encounter: Admit: 2023-12-23 | Discharge: 2023-12-23 | Payer: Medicaid (Managed Care)

## 2023-12-23 DIAGNOSIS — H35052 Retinal neovascularization, unspecified, left eye: Principal | ICD-10-CM

## 2023-12-23 DIAGNOSIS — I10 Essential (primary) hypertension: Principal | ICD-10-CM

## 2023-12-23 DIAGNOSIS — H43393 Other vitreous opacities, bilateral: Principal | ICD-10-CM

## 2023-12-23 DIAGNOSIS — Z794 Long term (current) use of insulin: Principal | ICD-10-CM

## 2023-12-23 DIAGNOSIS — E7800 Pure hypercholesterolemia, unspecified: Principal | ICD-10-CM

## 2023-12-23 DIAGNOSIS — F331 Major depressive disorder, recurrent, moderate: Principal | ICD-10-CM

## 2023-12-23 DIAGNOSIS — E559 Vitamin D deficiency, unspecified: Principal | ICD-10-CM

## 2023-12-23 DIAGNOSIS — I5032 Chronic diastolic (congestive) heart failure: Principal | ICD-10-CM

## 2023-12-23 DIAGNOSIS — E1165 Type 2 diabetes mellitus with hyperglycemia: Principal | ICD-10-CM

## 2023-12-23 DIAGNOSIS — E133592 Other specified diabetes mellitus with proliferative diabetic retinopathy without macular edema, left eye: Principal | ICD-10-CM

## 2023-12-25 ENCOUNTER — Ambulatory Visit: Admit: 2023-12-25 | Discharge: 2023-12-26 | Payer: Medicaid (Managed Care)

## 2023-12-25 DIAGNOSIS — M25561 Pain in right knee: Principal | ICD-10-CM

## 2023-12-25 MED ORDER — ERGOCALCIFEROL (VITAMIN D2) 1,250 MCG (50,000 UNIT) CAPSULE
ORAL_CAPSULE | ORAL | 0 refills | 56.00000 days | Status: CP
Start: 2023-12-25 — End: 2024-02-13

## 2023-12-29 ENCOUNTER — Inpatient Hospital Stay: Admit: 2023-12-29 | Discharge: 2023-12-29 | Payer: Medicaid (Managed Care)

## 2023-12-30 DIAGNOSIS — I5032 Chronic diastolic (congestive) heart failure: Principal | ICD-10-CM

## 2023-12-30 DIAGNOSIS — D649 Anemia, unspecified: Principal | ICD-10-CM

## 2023-12-30 DIAGNOSIS — I2510 Atherosclerotic heart disease of native coronary artery without angina pectoris: Principal | ICD-10-CM

## 2023-12-30 DIAGNOSIS — I739 Peripheral vascular disease, unspecified: Principal | ICD-10-CM

## 2024-01-02 DIAGNOSIS — E611 Iron deficiency: Principal | ICD-10-CM

## 2024-01-06 DIAGNOSIS — E1165 Type 2 diabetes mellitus with hyperglycemia: Principal | ICD-10-CM

## 2024-01-06 DIAGNOSIS — Z794 Long term (current) use of insulin: Principal | ICD-10-CM

## 2024-01-06 MED ORDER — DEXCOM G7 SENSOR DEVICE
0 refills | 0.00000 days | Status: CP
Start: 2024-01-06 — End: ?

## 2024-01-09 DIAGNOSIS — E611 Iron deficiency: Principal | ICD-10-CM

## 2024-01-10 ENCOUNTER — Ambulatory Visit: Admit: 2024-01-10 | Discharge: 2024-01-11 | Payer: Medicaid (Managed Care)

## 2024-01-10 ENCOUNTER — Inpatient Hospital Stay: Admit: 2024-01-10 | Discharge: 2024-01-11 | Payer: Medicaid (Managed Care)

## 2024-01-18 DIAGNOSIS — E1165 Type 2 diabetes mellitus with hyperglycemia: Principal | ICD-10-CM

## 2024-01-18 MED ORDER — INSULIN ASPART U-100  100 UNIT/ML SUBCUTANEOUS SOLUTION
0 refills | 0.00000 days
Start: 2024-01-18 — End: ?

## 2024-01-18 MED ORDER — NITROGLYCERIN 0.3 MG SUBLINGUAL TABLET
ORAL_TABLET | 0 refills | 0.00000 days
Start: 2024-01-18 — End: ?

## 2024-01-19 ENCOUNTER — Ambulatory Visit
Admit: 2024-01-19 | Discharge: 2024-01-20 | Payer: Medicaid (Managed Care) | Attending: Orthopaedic Surgery | Primary: Orthopaedic Surgery

## 2024-01-19 DIAGNOSIS — M778 Other enthesopathies, not elsewhere classified: Principal | ICD-10-CM

## 2024-01-19 MED ORDER — NITROGLYCERIN 0.3 MG SUBLINGUAL TABLET
ORAL_TABLET | 0 refills | 0.00000 days
Start: 2024-01-19 — End: ?

## 2024-01-20 MED ORDER — INSULIN ASPART U-100  100 UNIT/ML SUBCUTANEOUS SOLUTION
0 refills | 0.00000 days | Status: CP
Start: 2024-01-20 — End: ?

## 2024-01-21 ENCOUNTER — Ambulatory Visit: Admit: 2024-01-21 | Discharge: 2024-01-22 | Payer: Medicaid (Managed Care)

## 2024-01-21 ENCOUNTER — Encounter
Admit: 2024-01-21 | Discharge: 2024-01-22 | Payer: Medicaid (Managed Care) | Attending: Adult Health | Primary: Adult Health

## 2024-01-21 DIAGNOSIS — E785 Hyperlipidemia, unspecified: Principal | ICD-10-CM

## 2024-01-21 DIAGNOSIS — I5032 Chronic diastolic (congestive) heart failure: Principal | ICD-10-CM

## 2024-01-21 DIAGNOSIS — E611 Iron deficiency: Principal | ICD-10-CM

## 2024-01-21 DIAGNOSIS — I2510 Atherosclerotic heart disease of native coronary artery without angina pectoris: Principal | ICD-10-CM

## 2024-01-21 DIAGNOSIS — L84 Corns and callosities: Principal | ICD-10-CM

## 2024-01-21 DIAGNOSIS — I10 Essential (primary) hypertension: Principal | ICD-10-CM

## 2024-01-21 MED ORDER — METOPROLOL SUCCINATE ER 50 MG TABLET,EXTENDED RELEASE 24 HR
ORAL_TABLET | Freq: Every day | ORAL | 3 refills | 90.00000 days | Status: CP
Start: 2024-01-21 — End: 2025-01-15

## 2024-01-21 MED ORDER — SPIRONOLACTONE 25 MG TABLET
ORAL_TABLET | Freq: Every day | ORAL | 3 refills | 90.00000 days | Status: CP
Start: 2024-01-21 — End: 2025-01-15

## 2024-01-21 MED ORDER — LOSARTAN 25 MG TABLET
ORAL_TABLET | Freq: Every day | ORAL | 1 refills | 90.00000 days | Status: CP
Start: 2024-01-21 — End: ?

## 2024-01-29 ENCOUNTER — Encounter: Admit: 2024-01-29 | Discharge: 2024-01-30 | Payer: Medicaid (Managed Care)

## 2024-02-02 DIAGNOSIS — R531 Weakness: Principal | ICD-10-CM

## 2024-02-02 DIAGNOSIS — R6889 Other general symptoms and signs: Secondary | ICD-10-CM

## 2024-02-02 DIAGNOSIS — M6281 Muscle weakness (generalized): Secondary | ICD-10-CM

## 2024-02-02 DIAGNOSIS — Z7409 Other reduced mobility: Secondary | ICD-10-CM

## 2024-02-02 DIAGNOSIS — R269 Unspecified abnormalities of gait and mobility: Secondary | ICD-10-CM

## 2024-02-04 ENCOUNTER — Encounter: Admit: 2024-02-04 | Discharge: 2024-02-05 | Payer: Medicaid (Managed Care)

## 2024-02-05 DIAGNOSIS — E1165 Type 2 diabetes mellitus with hyperglycemia: Principal | ICD-10-CM

## 2024-02-05 DIAGNOSIS — Z794 Long term (current) use of insulin: Secondary | ICD-10-CM

## 2024-02-05 MED ORDER — OMNIPOD 5 G6-G7 PODS (GEN 5) SUBCUTANEOUS CARTRIDGE
3 refills | 0.00000 days | Status: CP
Start: 2024-02-05 — End: ?

## 2024-02-11 ENCOUNTER — Ambulatory Visit: Admit: 2024-02-11 | Discharge: 2024-02-12 | Payer: Medicaid (Managed Care)

## 2024-02-11 DIAGNOSIS — L97511 Non-pressure chronic ulcer of other part of right foot limited to breakdown of skin: Principal | ICD-10-CM

## 2024-02-11 MED ORDER — CILOSTAZOL 50 MG TABLET
ORAL_TABLET | Freq: Two times a day (BID) | ORAL | 11 refills | 30.00000 days | Status: CP
Start: 2024-02-11 — End: 2025-02-10
